# Patient Record
Sex: Female | Born: 1940 | Race: White | Hispanic: No | Marital: Married | Smoking: Former smoker
Health system: Southern US, Community
[De-identification: ages and names within clinical notes are randomized; demographics above are authoritative.]

## PROBLEM LIST (undated history)

## (undated) DIAGNOSIS — K559 Vascular disorder of intestine, unspecified: Secondary | ICD-10-CM

## (undated) DIAGNOSIS — E785 Hyperlipidemia, unspecified: Secondary | ICD-10-CM

## (undated) DIAGNOSIS — Z860101 Personal history of adenomatous and serrated colon polyps: Secondary | ICD-10-CM

## (undated) DIAGNOSIS — R739 Hyperglycemia, unspecified: Secondary | ICD-10-CM

## (undated) DIAGNOSIS — F419 Anxiety disorder, unspecified: Secondary | ICD-10-CM

## (undated) DIAGNOSIS — R7302 Impaired glucose tolerance (oral): Secondary | ICD-10-CM

## (undated) DIAGNOSIS — Z0000 Encounter for general adult medical examination without abnormal findings: Secondary | ICD-10-CM

## (undated) DIAGNOSIS — J449 Chronic obstructive pulmonary disease, unspecified: Secondary | ICD-10-CM

## (undated) DIAGNOSIS — Z86010 Personal history of colonic polyps: Secondary | ICD-10-CM

## (undated) DIAGNOSIS — F32A Depression, unspecified: Secondary | ICD-10-CM

## (undated) DIAGNOSIS — N952 Postmenopausal atrophic vaginitis: Secondary | ICD-10-CM

## (undated) DIAGNOSIS — R351 Nocturia: Secondary | ICD-10-CM

## (undated) DIAGNOSIS — E079 Disorder of thyroid, unspecified: Secondary | ICD-10-CM

## (undated) DIAGNOSIS — F329 Major depressive disorder, single episode, unspecified: Secondary | ICD-10-CM

## (undated) DIAGNOSIS — K219 Gastro-esophageal reflux disease without esophagitis: Secondary | ICD-10-CM

## (undated) DIAGNOSIS — Z8719 Personal history of other diseases of the digestive system: Secondary | ICD-10-CM

## (undated) DIAGNOSIS — I10 Essential (primary) hypertension: Secondary | ICD-10-CM

## (undated) DIAGNOSIS — A0472 Enterocolitis due to Clostridium difficile, not specified as recurrent: Secondary | ICD-10-CM

## (undated) DIAGNOSIS — R634 Abnormal weight loss: Secondary | ICD-10-CM

## (undated) DIAGNOSIS — M2550 Pain in unspecified joint: Secondary | ICD-10-CM

## (undated) HISTORY — DX: Nocturia: R35.1

## (undated) HISTORY — PX: BLADDER REPAIR: SHX76

## (undated) HISTORY — DX: Personal history of colonic polyps: Z86.010

## (undated) HISTORY — DX: Vascular disorder of intestine, unspecified: K55.9

## (undated) HISTORY — DX: Enterocolitis due to Clostridium difficile, not specified as recurrent: A04.72

## (undated) HISTORY — DX: Hyperglycemia, unspecified: R73.9

## (undated) HISTORY — DX: Major depressive disorder, single episode, unspecified: F32.9

## (undated) HISTORY — DX: Depression, unspecified: F32.A

## (undated) HISTORY — DX: Pain in unspecified joint: M25.50

## (undated) HISTORY — DX: Chronic obstructive pulmonary disease, unspecified: J44.9

## (undated) HISTORY — DX: Personal history of adenomatous and serrated colon polyps: Z86.0101

## (undated) HISTORY — DX: Postmenopausal atrophic vaginitis: N95.2

## (undated) HISTORY — DX: Anxiety disorder, unspecified: F41.9

## (undated) HISTORY — DX: Encounter for general adult medical examination without abnormal findings: Z00.00

## (undated) HISTORY — PX: DILATION AND CURETTAGE OF UTERUS: SHX78

## (undated) HISTORY — DX: Hyperlipidemia, unspecified: E78.5

## (undated) HISTORY — DX: Impaired glucose tolerance (oral): R73.02

## (undated) HISTORY — DX: Essential (primary) hypertension: I10

## (undated) HISTORY — DX: Personal history of other diseases of the digestive system: Z87.19

## (undated) HISTORY — DX: Gastro-esophageal reflux disease without esophagitis: K21.9

## (undated) HISTORY — DX: Disorder of thyroid, unspecified: E07.9

## (undated) HISTORY — PX: CHOLECYSTECTOMY: SHX55

## (undated) HISTORY — PX: TUBAL LIGATION: SHX77

## (undated) HISTORY — DX: Abnormal weight loss: R63.4

---

## 1991-10-30 HISTORY — PX: ABDOMINAL HYSTERECTOMY: SHX81

## 1998-01-28 ENCOUNTER — Emergency Department (HOSPITAL_COMMUNITY): Admission: EM | Admit: 1998-01-28 | Discharge: 1998-01-28 | Payer: Self-pay | Admitting: Emergency Medicine

## 2001-12-17 ENCOUNTER — Ambulatory Visit (HOSPITAL_COMMUNITY): Admission: RE | Admit: 2001-12-17 | Discharge: 2001-12-17 | Payer: Self-pay | Admitting: Internal Medicine

## 2001-12-17 ENCOUNTER — Encounter: Payer: Self-pay | Admitting: Internal Medicine

## 2002-02-09 ENCOUNTER — Ambulatory Visit (HOSPITAL_COMMUNITY): Admission: RE | Admit: 2002-02-09 | Discharge: 2002-02-09 | Payer: Self-pay | Admitting: Internal Medicine

## 2002-04-27 ENCOUNTER — Ambulatory Visit (HOSPITAL_COMMUNITY): Admission: RE | Admit: 2002-04-27 | Discharge: 2002-04-27 | Payer: Self-pay | Admitting: Internal Medicine

## 2002-04-27 ENCOUNTER — Encounter: Payer: Self-pay | Admitting: Internal Medicine

## 2003-05-06 ENCOUNTER — Encounter: Payer: Self-pay | Admitting: Internal Medicine

## 2003-05-06 ENCOUNTER — Ambulatory Visit (HOSPITAL_COMMUNITY): Admission: RE | Admit: 2003-05-06 | Discharge: 2003-05-06 | Payer: Self-pay | Admitting: Internal Medicine

## 2003-05-11 ENCOUNTER — Ambulatory Visit (HOSPITAL_COMMUNITY): Admission: RE | Admit: 2003-05-11 | Discharge: 2003-05-11 | Payer: Self-pay | Admitting: Internal Medicine

## 2003-05-11 ENCOUNTER — Encounter: Payer: Self-pay | Admitting: Internal Medicine

## 2003-06-21 ENCOUNTER — Ambulatory Visit (HOSPITAL_COMMUNITY): Admission: RE | Admit: 2003-06-21 | Discharge: 2003-06-21 | Payer: Self-pay | Admitting: Internal Medicine

## 2003-06-21 ENCOUNTER — Encounter: Payer: Self-pay | Admitting: Internal Medicine

## 2003-09-27 ENCOUNTER — Ambulatory Visit (HOSPITAL_COMMUNITY): Admission: RE | Admit: 2003-09-27 | Discharge: 2003-09-27 | Payer: Self-pay | Admitting: Internal Medicine

## 2004-04-19 ENCOUNTER — Ambulatory Visit (HOSPITAL_COMMUNITY): Admission: RE | Admit: 2004-04-19 | Discharge: 2004-04-19 | Payer: Self-pay | Admitting: Internal Medicine

## 2004-09-28 ENCOUNTER — Inpatient Hospital Stay (HOSPITAL_COMMUNITY): Admission: EM | Admit: 2004-09-28 | Discharge: 2004-10-04 | Payer: Self-pay | Admitting: Emergency Medicine

## 2004-10-05 ENCOUNTER — Ambulatory Visit: Payer: Self-pay | Admitting: Internal Medicine

## 2004-11-22 ENCOUNTER — Ambulatory Visit: Payer: Self-pay | Admitting: Internal Medicine

## 2005-01-05 ENCOUNTER — Ambulatory Visit: Payer: Self-pay | Admitting: Internal Medicine

## 2005-02-15 ENCOUNTER — Ambulatory Visit: Payer: Self-pay | Admitting: Internal Medicine

## 2005-07-30 ENCOUNTER — Ambulatory Visit (HOSPITAL_COMMUNITY): Admission: RE | Admit: 2005-07-30 | Discharge: 2005-07-30 | Payer: Self-pay | Admitting: Internal Medicine

## 2006-02-05 ENCOUNTER — Ambulatory Visit (HOSPITAL_COMMUNITY): Admission: RE | Admit: 2006-02-05 | Discharge: 2006-02-05 | Payer: Self-pay | Admitting: Internal Medicine

## 2006-04-30 ENCOUNTER — Ambulatory Visit: Payer: Self-pay | Admitting: Internal Medicine

## 2006-06-13 ENCOUNTER — Ambulatory Visit: Payer: Self-pay | Admitting: Internal Medicine

## 2006-07-11 ENCOUNTER — Ambulatory Visit: Payer: Self-pay | Admitting: Critical Care Medicine

## 2006-09-26 ENCOUNTER — Ambulatory Visit: Payer: Self-pay | Admitting: Internal Medicine

## 2006-10-25 ENCOUNTER — Ambulatory Visit: Payer: Self-pay | Admitting: Critical Care Medicine

## 2006-11-04 ENCOUNTER — Ambulatory Visit: Payer: Self-pay | Admitting: Internal Medicine

## 2006-12-16 ENCOUNTER — Ambulatory Visit (HOSPITAL_COMMUNITY): Admission: RE | Admit: 2006-12-16 | Discharge: 2006-12-16 | Payer: Self-pay | Admitting: Internal Medicine

## 2006-12-16 ENCOUNTER — Encounter (INDEPENDENT_AMBULATORY_CARE_PROVIDER_SITE_OTHER): Payer: Self-pay | Admitting: *Deleted

## 2006-12-23 ENCOUNTER — Ambulatory Visit: Payer: Self-pay | Admitting: Internal Medicine

## 2007-01-13 ENCOUNTER — Ambulatory Visit: Payer: Self-pay | Admitting: Family Medicine

## 2007-01-20 ENCOUNTER — Ambulatory Visit: Payer: Self-pay | Admitting: Internal Medicine

## 2007-01-23 ENCOUNTER — Inpatient Hospital Stay (HOSPITAL_COMMUNITY): Admission: EM | Admit: 2007-01-23 | Discharge: 2007-01-28 | Payer: Self-pay | Admitting: Emergency Medicine

## 2007-01-23 ENCOUNTER — Ambulatory Visit: Payer: Self-pay | Admitting: Pulmonary Disease

## 2007-02-03 ENCOUNTER — Ambulatory Visit: Payer: Self-pay | Admitting: Internal Medicine

## 2007-02-03 ENCOUNTER — Ambulatory Visit: Payer: Self-pay | Admitting: Pulmonary Disease

## 2007-02-17 ENCOUNTER — Ambulatory Visit: Payer: Self-pay | Admitting: Internal Medicine

## 2007-02-24 ENCOUNTER — Ambulatory Visit: Payer: Self-pay | Admitting: Internal Medicine

## 2007-03-11 ENCOUNTER — Ambulatory Visit: Payer: Self-pay | Admitting: Internal Medicine

## 2007-04-28 ENCOUNTER — Ambulatory Visit: Payer: Self-pay | Admitting: Internal Medicine

## 2007-07-22 ENCOUNTER — Ambulatory Visit: Payer: Self-pay | Admitting: Internal Medicine

## 2007-07-22 LAB — CONVERTED CEMR LAB
Basophils Absolute: 0.1 10*3/uL (ref 0.0–0.1)
Eosinophils Absolute: 0.2 10*3/uL (ref 0.0–0.6)
Eosinophils Relative: 3.2 % (ref 0.0–5.0)
GFR calc Af Amer: 129 mL/min
GFR calc non Af Amer: 107 mL/min
Glucose, Bld: 82 mg/dL (ref 70–99)
HCT: 42.1 % (ref 36.0–46.0)
Lymphocytes Relative: 32.5 % (ref 12.0–46.0)
MCHC: 34 g/dL (ref 30.0–36.0)
MCV: 90 fL (ref 78.0–100.0)
Neutro Abs: 3.6 10*3/uL (ref 1.4–7.7)
Neutrophils Relative %: 52.6 % (ref 43.0–77.0)
Platelets: 392 10*3/uL (ref 150–400)
Sodium: 142 meq/L (ref 135–145)
WBC: 7 10*3/uL (ref 4.5–10.5)

## 2007-07-31 ENCOUNTER — Ambulatory Visit: Payer: Self-pay | Admitting: Internal Medicine

## 2007-07-31 ENCOUNTER — Encounter: Payer: Self-pay | Admitting: Internal Medicine

## 2007-07-31 DIAGNOSIS — J449 Chronic obstructive pulmonary disease, unspecified: Secondary | ICD-10-CM

## 2007-07-31 DIAGNOSIS — I10 Essential (primary) hypertension: Secondary | ICD-10-CM

## 2007-07-31 DIAGNOSIS — J4489 Other specified chronic obstructive pulmonary disease: Secondary | ICD-10-CM | POA: Insufficient documentation

## 2007-07-31 DIAGNOSIS — K219 Gastro-esophageal reflux disease without esophagitis: Secondary | ICD-10-CM

## 2007-08-04 ENCOUNTER — Encounter: Payer: Self-pay | Admitting: Internal Medicine

## 2007-08-15 ENCOUNTER — Ambulatory Visit: Payer: Self-pay | Admitting: Internal Medicine

## 2007-10-15 ENCOUNTER — Telehealth (INDEPENDENT_AMBULATORY_CARE_PROVIDER_SITE_OTHER): Payer: Self-pay | Admitting: *Deleted

## 2007-10-17 DIAGNOSIS — F418 Other specified anxiety disorders: Secondary | ICD-10-CM

## 2007-10-17 DIAGNOSIS — G43909 Migraine, unspecified, not intractable, without status migrainosus: Secondary | ICD-10-CM | POA: Insufficient documentation

## 2007-10-17 DIAGNOSIS — F411 Generalized anxiety disorder: Secondary | ICD-10-CM | POA: Insufficient documentation

## 2007-10-27 ENCOUNTER — Ambulatory Visit: Payer: Self-pay | Admitting: Internal Medicine

## 2007-10-27 ENCOUNTER — Encounter (INDEPENDENT_AMBULATORY_CARE_PROVIDER_SITE_OTHER): Payer: Self-pay | Admitting: *Deleted

## 2007-10-27 DIAGNOSIS — E039 Hypothyroidism, unspecified: Secondary | ICD-10-CM | POA: Insufficient documentation

## 2007-10-27 DIAGNOSIS — R498 Other voice and resonance disorders: Secondary | ICD-10-CM

## 2007-12-09 ENCOUNTER — Telehealth: Payer: Self-pay | Admitting: Internal Medicine

## 2007-12-20 ENCOUNTER — Emergency Department (HOSPITAL_COMMUNITY): Admission: EM | Admit: 2007-12-20 | Discharge: 2007-12-20 | Payer: Self-pay | Admitting: Emergency Medicine

## 2007-12-22 ENCOUNTER — Ambulatory Visit: Payer: Self-pay | Admitting: Internal Medicine

## 2007-12-22 DIAGNOSIS — E739 Lactose intolerance, unspecified: Secondary | ICD-10-CM | POA: Insufficient documentation

## 2007-12-22 DIAGNOSIS — D72829 Elevated white blood cell count, unspecified: Secondary | ICD-10-CM | POA: Insufficient documentation

## 2007-12-22 DIAGNOSIS — R1032 Left lower quadrant pain: Secondary | ICD-10-CM

## 2007-12-22 LAB — CONVERTED CEMR LAB
BUN: 10 mg/dL (ref 6–23)
Basophils Absolute: 0 K/uL (ref 0.0–0.1)
Basophils Relative: 0 % (ref 0.0–1.0)
Bilirubin Urine: NEGATIVE
CO2: 30 meq/L (ref 19–32)
Calcium: 8.7 mg/dL (ref 8.4–10.5)
Chloride: 93 meq/L — ABNORMAL LOW (ref 96–112)
Creatinine, Ser: 0.8 mg/dL (ref 0.4–1.2)
Eosinophils Absolute: 0.1 K/uL (ref 0.0–0.6)
Eosinophils Relative: 0.4 % (ref 0.0–5.0)
GFR calc Af Amer: 93 mL/min
GFR calc non Af Amer: 77 mL/min
Glucose, Bld: 119 mg/dL — ABNORMAL HIGH (ref 70–99)
HCT: 41.5 % (ref 36.0–46.0)
Hemoglobin: 13.8 g/dL (ref 12.0–15.0)
Leukocytes, UA: NEGATIVE
Lymphocytes Relative: 3.1 % — ABNORMAL LOW (ref 12.0–46.0)
MCHC: 33.3 g/dL (ref 30.0–36.0)
MCV: 89.4 fL (ref 78.0–100.0)
Monocytes Absolute: 1 K/uL — ABNORMAL HIGH (ref 0.2–0.7)
Monocytes Relative: 4.2 % (ref 3.0–11.0)
Neutro Abs: 21.8 K/uL — ABNORMAL HIGH (ref 1.4–7.7)
Neutrophils Relative %: 92.3 % — ABNORMAL HIGH (ref 43.0–77.0)
Nitrite: NEGATIVE
Platelets: 336 K/uL (ref 150–400)
Potassium: 3.9 meq/L (ref 3.5–5.1)
RBC: 4.65 M/uL (ref 3.87–5.11)
RDW: 12.8 % (ref 11.5–14.6)
Sodium: 134 meq/L — ABNORMAL LOW (ref 135–145)
Specific Gravity, Urine: <=1.005 (ref 1.000–1.03)
Total Protein, Urine: NEGATIVE mg/dL
Urine Glucose: NEGATIVE mg/dL
Urobilinogen, UA: 0.2 (ref 0.0–1.0)
WBC: 23.6 10*3/microliter (ref 4.5–10.5)
pH: 6 (ref 5.0–8.0)

## 2007-12-23 ENCOUNTER — Ambulatory Visit: Payer: Self-pay | Admitting: Cardiology

## 2007-12-24 ENCOUNTER — Encounter: Payer: Self-pay | Admitting: Internal Medicine

## 2007-12-25 ENCOUNTER — Encounter: Payer: Self-pay | Admitting: Internal Medicine

## 2007-12-26 ENCOUNTER — Ambulatory Visit: Payer: Self-pay | Admitting: Internal Medicine

## 2007-12-26 ENCOUNTER — Inpatient Hospital Stay (HOSPITAL_COMMUNITY): Admission: AD | Admit: 2007-12-26 | Discharge: 2007-12-31 | Payer: Self-pay | Admitting: Internal Medicine

## 2007-12-26 DIAGNOSIS — R5383 Other fatigue: Secondary | ICD-10-CM

## 2007-12-26 DIAGNOSIS — R5381 Other malaise: Secondary | ICD-10-CM

## 2007-12-30 ENCOUNTER — Encounter: Payer: Self-pay | Admitting: Internal Medicine

## 2007-12-30 ENCOUNTER — Ambulatory Visit: Payer: Self-pay | Admitting: Internal Medicine

## 2007-12-31 ENCOUNTER — Encounter: Payer: Self-pay | Admitting: Internal Medicine

## 2008-01-09 ENCOUNTER — Ambulatory Visit: Payer: Self-pay | Admitting: Internal Medicine

## 2008-01-09 DIAGNOSIS — R609 Edema, unspecified: Secondary | ICD-10-CM | POA: Insufficient documentation

## 2008-01-09 DIAGNOSIS — K5289 Other specified noninfective gastroenteritis and colitis: Secondary | ICD-10-CM | POA: Insufficient documentation

## 2008-01-22 ENCOUNTER — Encounter (INDEPENDENT_AMBULATORY_CARE_PROVIDER_SITE_OTHER): Payer: Self-pay | Admitting: *Deleted

## 2008-01-23 ENCOUNTER — Telehealth: Payer: Self-pay | Admitting: Internal Medicine

## 2008-01-23 ENCOUNTER — Encounter (INDEPENDENT_AMBULATORY_CARE_PROVIDER_SITE_OTHER): Payer: Self-pay | Admitting: *Deleted

## 2008-01-23 ENCOUNTER — Inpatient Hospital Stay (HOSPITAL_COMMUNITY): Admission: EM | Admit: 2008-01-23 | Discharge: 2008-01-28 | Payer: Self-pay | Admitting: Emergency Medicine

## 2008-01-24 ENCOUNTER — Encounter (INDEPENDENT_AMBULATORY_CARE_PROVIDER_SITE_OTHER): Payer: Self-pay | Admitting: *Deleted

## 2008-01-26 ENCOUNTER — Encounter (INDEPENDENT_AMBULATORY_CARE_PROVIDER_SITE_OTHER): Payer: Self-pay | Admitting: *Deleted

## 2008-01-28 ENCOUNTER — Encounter (INDEPENDENT_AMBULATORY_CARE_PROVIDER_SITE_OTHER): Payer: Self-pay | Admitting: *Deleted

## 2008-02-02 ENCOUNTER — Ambulatory Visit: Payer: Self-pay | Admitting: Internal Medicine

## 2008-02-03 ENCOUNTER — Ambulatory Visit: Payer: Self-pay | Admitting: Internal Medicine

## 2008-02-03 ENCOUNTER — Encounter: Payer: Self-pay | Admitting: Internal Medicine

## 2008-02-04 ENCOUNTER — Ambulatory Visit: Payer: Self-pay | Admitting: Internal Medicine

## 2008-02-04 DIAGNOSIS — K56609 Unspecified intestinal obstruction, unspecified as to partial versus complete obstruction: Secondary | ICD-10-CM | POA: Insufficient documentation

## 2008-02-06 ENCOUNTER — Ambulatory Visit (HOSPITAL_COMMUNITY): Admission: RE | Admit: 2008-02-06 | Discharge: 2008-02-06 | Payer: Self-pay | Admitting: Internal Medicine

## 2008-02-19 ENCOUNTER — Telehealth (INDEPENDENT_AMBULATORY_CARE_PROVIDER_SITE_OTHER): Payer: Self-pay | Admitting: *Deleted

## 2008-02-23 ENCOUNTER — Encounter: Payer: Self-pay | Admitting: Internal Medicine

## 2008-02-24 ENCOUNTER — Telehealth: Payer: Self-pay | Admitting: Internal Medicine

## 2008-02-25 ENCOUNTER — Ambulatory Visit: Payer: Self-pay | Admitting: Internal Medicine

## 2008-02-26 ENCOUNTER — Telehealth: Payer: Self-pay | Admitting: Internal Medicine

## 2008-03-01 ENCOUNTER — Encounter: Payer: Self-pay | Admitting: Internal Medicine

## 2008-03-16 ENCOUNTER — Ambulatory Visit: Payer: Self-pay | Admitting: Internal Medicine

## 2008-03-16 DIAGNOSIS — R197 Diarrhea, unspecified: Secondary | ICD-10-CM

## 2008-03-16 DIAGNOSIS — R030 Elevated blood-pressure reading, without diagnosis of hypertension: Secondary | ICD-10-CM

## 2008-04-08 ENCOUNTER — Ambulatory Visit: Payer: Self-pay | Admitting: Internal Medicine

## 2008-04-08 DIAGNOSIS — R300 Dysuria: Secondary | ICD-10-CM

## 2008-04-08 DIAGNOSIS — R634 Abnormal weight loss: Secondary | ICD-10-CM | POA: Insufficient documentation

## 2008-04-08 LAB — CONVERTED CEMR LAB
Ketones, urine, test strip: NEGATIVE
Mucus, UA: NEGATIVE
Nitrite: NEGATIVE
Specific Gravity, Urine: <=1.005 (ref 1.000–1.03)
Total Protein, Urine: 100 mg/dL — AB
Urine Glucose: NEGATIVE mg/dL
pH: 8.5

## 2008-04-30 ENCOUNTER — Ambulatory Visit: Payer: Self-pay | Admitting: Family Medicine

## 2008-04-30 DIAGNOSIS — J209 Acute bronchitis, unspecified: Secondary | ICD-10-CM

## 2008-05-18 ENCOUNTER — Telehealth: Payer: Self-pay | Admitting: Internal Medicine

## 2008-05-18 ENCOUNTER — Ambulatory Visit (HOSPITAL_BASED_OUTPATIENT_CLINIC_OR_DEPARTMENT_OTHER): Admission: RE | Admit: 2008-05-18 | Discharge: 2008-05-18 | Payer: Self-pay | Admitting: Internal Medicine

## 2008-05-18 ENCOUNTER — Ambulatory Visit: Payer: Self-pay | Admitting: Internal Medicine

## 2008-05-24 ENCOUNTER — Ambulatory Visit: Payer: Self-pay | Admitting: Internal Medicine

## 2008-05-24 DIAGNOSIS — J441 Chronic obstructive pulmonary disease with (acute) exacerbation: Secondary | ICD-10-CM

## 2008-06-02 ENCOUNTER — Ambulatory Visit: Payer: Self-pay | Admitting: Internal Medicine

## 2008-06-02 ENCOUNTER — Inpatient Hospital Stay (HOSPITAL_COMMUNITY): Admission: AD | Admit: 2008-06-02 | Discharge: 2008-06-05 | Payer: Self-pay | Admitting: Internal Medicine

## 2008-06-04 ENCOUNTER — Ambulatory Visit: Payer: Self-pay | Admitting: Internal Medicine

## 2008-06-09 ENCOUNTER — Encounter: Payer: Self-pay | Admitting: Internal Medicine

## 2008-06-23 ENCOUNTER — Encounter: Payer: Self-pay | Admitting: Internal Medicine

## 2008-06-25 ENCOUNTER — Ambulatory Visit: Payer: Self-pay | Admitting: Internal Medicine

## 2008-06-25 DIAGNOSIS — M810 Age-related osteoporosis without current pathological fracture: Secondary | ICD-10-CM

## 2008-06-30 ENCOUNTER — Ambulatory Visit: Payer: Self-pay | Admitting: Family Medicine

## 2008-06-30 ENCOUNTER — Encounter: Payer: Self-pay | Admitting: Internal Medicine

## 2008-07-02 ENCOUNTER — Ambulatory Visit: Payer: Self-pay | Admitting: *Deleted

## 2008-07-02 ENCOUNTER — Inpatient Hospital Stay (HOSPITAL_COMMUNITY): Admission: EM | Admit: 2008-07-02 | Discharge: 2008-07-04 | Payer: Self-pay | Admitting: Internal Medicine

## 2008-07-02 ENCOUNTER — Encounter: Payer: Self-pay | Admitting: Emergency Medicine

## 2008-07-02 DIAGNOSIS — R079 Chest pain, unspecified: Secondary | ICD-10-CM

## 2008-07-02 DIAGNOSIS — R231 Pallor: Secondary | ICD-10-CM

## 2008-07-02 DIAGNOSIS — R0602 Shortness of breath: Secondary | ICD-10-CM | POA: Insufficient documentation

## 2008-07-16 ENCOUNTER — Encounter: Payer: Self-pay | Admitting: Internal Medicine

## 2008-07-16 ENCOUNTER — Ambulatory Visit: Payer: Self-pay

## 2008-07-26 ENCOUNTER — Ambulatory Visit: Payer: Self-pay | Admitting: Internal Medicine

## 2008-07-26 LAB — CONVERTED CEMR LAB
CO2: 33 meq/L — ABNORMAL HIGH (ref 19–32)
Chloride: 99 meq/L (ref 96–112)
Magnesium: 2.4 mg/dL (ref 1.5–2.5)
Phosphorus: 4.4 mg/dL (ref 2.3–4.6)
Sodium: 140 meq/L (ref 135–145)
Vit D, 1,25-Dihydroxy: 25 — ABNORMAL LOW (ref 30–89)

## 2008-07-27 ENCOUNTER — Ambulatory Visit: Payer: Self-pay | Admitting: Cardiology

## 2008-07-28 ENCOUNTER — Telehealth: Payer: Self-pay | Admitting: Internal Medicine

## 2008-07-29 ENCOUNTER — Encounter: Payer: Self-pay | Admitting: Internal Medicine

## 2008-08-03 ENCOUNTER — Ambulatory Visit (HOSPITAL_COMMUNITY): Admission: RE | Admit: 2008-08-03 | Discharge: 2008-08-03 | Payer: Self-pay | Admitting: Internal Medicine

## 2008-08-11 ENCOUNTER — Ambulatory Visit: Payer: Self-pay | Admitting: Internal Medicine

## 2008-08-20 ENCOUNTER — Telehealth: Payer: Self-pay | Admitting: Family Medicine

## 2008-09-21 ENCOUNTER — Ambulatory Visit: Payer: Self-pay | Admitting: Internal Medicine

## 2008-09-21 DIAGNOSIS — M2550 Pain in unspecified joint: Secondary | ICD-10-CM | POA: Insufficient documentation

## 2008-09-29 ENCOUNTER — Ambulatory Visit: Payer: Self-pay | Admitting: Diagnostic Radiology

## 2008-09-29 ENCOUNTER — Ambulatory Visit: Payer: Self-pay | Admitting: *Deleted

## 2008-09-29 ENCOUNTER — Emergency Department (HOSPITAL_BASED_OUTPATIENT_CLINIC_OR_DEPARTMENT_OTHER): Admission: EM | Admit: 2008-09-29 | Discharge: 2008-09-29 | Payer: Self-pay | Admitting: Emergency Medicine

## 2008-09-30 ENCOUNTER — Telehealth (INDEPENDENT_AMBULATORY_CARE_PROVIDER_SITE_OTHER): Payer: Self-pay | Admitting: *Deleted

## 2008-10-12 ENCOUNTER — Ambulatory Visit: Payer: Self-pay | Admitting: Internal Medicine

## 2008-11-02 ENCOUNTER — Ambulatory Visit: Payer: Self-pay | Admitting: Internal Medicine

## 2008-11-17 ENCOUNTER — Ambulatory Visit: Payer: Self-pay | Admitting: Internal Medicine

## 2008-11-17 ENCOUNTER — Telehealth: Payer: Self-pay | Admitting: Internal Medicine

## 2008-11-18 ENCOUNTER — Encounter: Payer: Self-pay | Admitting: Internal Medicine

## 2009-01-17 ENCOUNTER — Ambulatory Visit: Payer: Self-pay | Admitting: Internal Medicine

## 2009-01-20 ENCOUNTER — Ambulatory Visit: Payer: Self-pay | Admitting: Internal Medicine

## 2009-01-24 ENCOUNTER — Telehealth (INDEPENDENT_AMBULATORY_CARE_PROVIDER_SITE_OTHER): Payer: Self-pay | Admitting: *Deleted

## 2009-02-01 ENCOUNTER — Ambulatory Visit: Payer: Self-pay | Admitting: Internal Medicine

## 2009-02-07 ENCOUNTER — Telehealth (INDEPENDENT_AMBULATORY_CARE_PROVIDER_SITE_OTHER): Payer: Self-pay | Admitting: *Deleted

## 2009-02-15 ENCOUNTER — Telehealth: Payer: Self-pay | Admitting: Internal Medicine

## 2009-02-24 DIAGNOSIS — Z860100 Personal history of colon polyps, unspecified: Secondary | ICD-10-CM | POA: Insufficient documentation

## 2009-02-24 DIAGNOSIS — Z8719 Personal history of other diseases of the digestive system: Secondary | ICD-10-CM

## 2009-02-28 ENCOUNTER — Ambulatory Visit: Payer: Self-pay | Admitting: Internal Medicine

## 2009-02-28 DIAGNOSIS — R159 Full incontinence of feces: Secondary | ICD-10-CM

## 2009-03-08 ENCOUNTER — Ambulatory Visit: Payer: Self-pay | Admitting: Internal Medicine

## 2009-03-15 ENCOUNTER — Encounter: Payer: Self-pay | Admitting: Internal Medicine

## 2009-04-07 ENCOUNTER — Encounter: Payer: Self-pay | Admitting: Internal Medicine

## 2009-04-18 ENCOUNTER — Ambulatory Visit: Payer: Self-pay | Admitting: Internal Medicine

## 2009-04-21 ENCOUNTER — Ambulatory Visit: Payer: Self-pay | Admitting: Internal Medicine

## 2009-05-10 ENCOUNTER — Telehealth (INDEPENDENT_AMBULATORY_CARE_PROVIDER_SITE_OTHER): Payer: Self-pay | Admitting: *Deleted

## 2009-05-16 ENCOUNTER — Telehealth: Payer: Self-pay | Admitting: Internal Medicine

## 2009-05-30 ENCOUNTER — Telehealth: Payer: Self-pay | Admitting: Internal Medicine

## 2009-05-30 ENCOUNTER — Encounter: Payer: Self-pay | Admitting: Internal Medicine

## 2009-06-10 ENCOUNTER — Encounter: Payer: Self-pay | Admitting: Internal Medicine

## 2009-06-21 ENCOUNTER — Encounter: Payer: Self-pay | Admitting: Internal Medicine

## 2009-06-28 ENCOUNTER — Encounter: Payer: Self-pay | Admitting: Internal Medicine

## 2009-07-07 ENCOUNTER — Encounter: Payer: Self-pay | Admitting: Internal Medicine

## 2009-07-08 ENCOUNTER — Ambulatory Visit: Payer: Self-pay | Admitting: Internal Medicine

## 2009-08-12 ENCOUNTER — Ambulatory Visit (HOSPITAL_BASED_OUTPATIENT_CLINIC_OR_DEPARTMENT_OTHER): Admission: RE | Admit: 2009-08-12 | Discharge: 2009-08-12 | Payer: Self-pay | Admitting: Internal Medicine

## 2009-08-12 ENCOUNTER — Ambulatory Visit: Payer: Self-pay | Admitting: Radiology

## 2009-08-18 ENCOUNTER — Ambulatory Visit: Payer: Self-pay | Admitting: Internal Medicine

## 2009-08-22 ENCOUNTER — Ambulatory Visit: Payer: Self-pay | Admitting: Internal Medicine

## 2009-08-22 ENCOUNTER — Ambulatory Visit: Payer: Self-pay | Admitting: Diagnostic Radiology

## 2009-08-22 ENCOUNTER — Ambulatory Visit (HOSPITAL_BASED_OUTPATIENT_CLINIC_OR_DEPARTMENT_OTHER): Admission: RE | Admit: 2009-08-22 | Discharge: 2009-08-22 | Payer: Self-pay | Admitting: Internal Medicine

## 2009-12-29 ENCOUNTER — Ambulatory Visit: Payer: Self-pay | Admitting: Internal Medicine

## 2009-12-29 ENCOUNTER — Telehealth: Payer: Self-pay | Admitting: Internal Medicine

## 2009-12-29 LAB — CONVERTED CEMR LAB
Basophils Absolute: 0.1 10*3/uL (ref 0.0–0.1)
Calcium: 8.9 mg/dL (ref 8.4–10.5)
Creatinine, Ser: 0.6 mg/dL (ref 0.4–1.2)
GFR calc non Af Amer: 105.83 mL/min (ref >60)
Glucose, Bld: 84 mg/dL (ref 70–99)
Hemoglobin: 13.1 g/dL (ref 12.0–15.0)
Lymphocytes Relative: 33.8 % (ref 12.0–46.0)
Monocytes Relative: 9.2 % (ref 3.0–12.0)
Neutro Abs: 3.1 10*3/uL (ref 1.4–7.7)
Neutrophils Relative %: 54.3 % (ref 43.0–77.0)
RDW: 13.4 % (ref 11.5–14.6)
Sodium: 145 meq/L (ref 135–145)

## 2010-01-12 ENCOUNTER — Ambulatory Visit: Payer: Self-pay | Admitting: Internal Medicine

## 2010-01-12 ENCOUNTER — Encounter (INDEPENDENT_AMBULATORY_CARE_PROVIDER_SITE_OTHER): Payer: Self-pay | Admitting: *Deleted

## 2010-01-19 ENCOUNTER — Ambulatory Visit (HOSPITAL_COMMUNITY): Admission: RE | Admit: 2010-01-19 | Discharge: 2010-01-19 | Payer: Self-pay | Admitting: Internal Medicine

## 2010-01-24 ENCOUNTER — Telehealth: Payer: Self-pay | Admitting: Internal Medicine

## 2010-02-01 ENCOUNTER — Encounter (INDEPENDENT_AMBULATORY_CARE_PROVIDER_SITE_OTHER): Payer: Self-pay | Admitting: *Deleted

## 2010-02-07 ENCOUNTER — Encounter: Payer: Self-pay | Admitting: Internal Medicine

## 2010-02-20 ENCOUNTER — Encounter (INDEPENDENT_AMBULATORY_CARE_PROVIDER_SITE_OTHER): Payer: Self-pay | Admitting: *Deleted

## 2010-02-22 ENCOUNTER — Ambulatory Visit: Payer: Self-pay | Admitting: Internal Medicine

## 2010-03-01 ENCOUNTER — Ambulatory Visit: Payer: Self-pay | Admitting: Internal Medicine

## 2010-03-03 ENCOUNTER — Encounter: Payer: Self-pay | Admitting: Internal Medicine

## 2010-03-10 ENCOUNTER — Ambulatory Visit: Payer: Self-pay | Admitting: Internal Medicine

## 2010-03-13 ENCOUNTER — Telehealth: Payer: Self-pay | Admitting: Internal Medicine

## 2010-03-13 ENCOUNTER — Ambulatory Visit: Payer: Self-pay | Admitting: Internal Medicine

## 2010-03-31 ENCOUNTER — Telehealth: Payer: Self-pay | Admitting: Internal Medicine

## 2010-05-11 ENCOUNTER — Telehealth: Payer: Self-pay | Admitting: Internal Medicine

## 2010-05-11 ENCOUNTER — Encounter: Payer: Self-pay | Admitting: Internal Medicine

## 2010-05-12 ENCOUNTER — Encounter: Payer: Self-pay | Admitting: Internal Medicine

## 2010-05-23 ENCOUNTER — Encounter: Payer: Self-pay | Admitting: Internal Medicine

## 2010-05-29 ENCOUNTER — Ambulatory Visit: Payer: Self-pay | Admitting: Internal Medicine

## 2010-05-30 ENCOUNTER — Ambulatory Visit: Payer: Self-pay | Admitting: Pulmonary Disease

## 2010-05-30 DIAGNOSIS — B370 Candidal stomatitis: Secondary | ICD-10-CM | POA: Insufficient documentation

## 2010-07-26 ENCOUNTER — Encounter (HOSPITAL_COMMUNITY): Admission: RE | Admit: 2010-07-26 | Discharge: 2010-07-28 | Payer: Self-pay | Admitting: Pulmonary Disease

## 2010-07-29 ENCOUNTER — Encounter (HOSPITAL_COMMUNITY)
Admission: RE | Admit: 2010-07-29 | Discharge: 2010-10-27 | Payer: Self-pay | Source: Home / Self Care | Attending: Pulmonary Disease | Admitting: Pulmonary Disease

## 2010-08-02 ENCOUNTER — Ambulatory Visit: Payer: Self-pay | Admitting: Internal Medicine

## 2010-08-30 ENCOUNTER — Ambulatory Visit: Payer: Self-pay | Admitting: Pulmonary Disease

## 2010-09-01 ENCOUNTER — Telehealth: Payer: Self-pay | Admitting: Internal Medicine

## 2010-09-01 ENCOUNTER — Ambulatory Visit: Payer: Self-pay | Admitting: Internal Medicine

## 2010-09-28 ENCOUNTER — Ambulatory Visit: Payer: Self-pay | Admitting: Pulmonary Disease

## 2010-10-03 ENCOUNTER — Telehealth (INDEPENDENT_AMBULATORY_CARE_PROVIDER_SITE_OTHER): Payer: Self-pay | Admitting: *Deleted

## 2010-10-04 ENCOUNTER — Ambulatory Visit: Payer: Self-pay | Admitting: Pulmonary Disease

## 2010-10-11 ENCOUNTER — Ambulatory Visit: Payer: Self-pay | Admitting: Adult Health

## 2010-10-29 ENCOUNTER — Encounter (HOSPITAL_COMMUNITY)
Admission: RE | Admit: 2010-10-29 | Discharge: 2010-11-28 | Payer: Self-pay | Source: Home / Self Care | Attending: Pulmonary Disease | Admitting: Pulmonary Disease

## 2010-11-14 ENCOUNTER — Encounter: Payer: Self-pay | Admitting: Internal Medicine

## 2010-11-14 ENCOUNTER — Encounter: Payer: Self-pay | Admitting: Pulmonary Disease

## 2010-11-17 ENCOUNTER — Telehealth: Payer: Self-pay | Admitting: Internal Medicine

## 2010-11-19 ENCOUNTER — Encounter: Payer: Self-pay | Admitting: Internal Medicine

## 2010-11-27 ENCOUNTER — Ambulatory Visit (INDEPENDENT_AMBULATORY_CARE_PROVIDER_SITE_OTHER)
Admission: RE | Admit: 2010-11-27 | Discharge: 2010-11-27 | Payer: Medicare FFS | Source: Home / Self Care | Attending: Internal Medicine | Admitting: Internal Medicine

## 2010-11-27 DIAGNOSIS — R634 Abnormal weight loss: Secondary | ICD-10-CM

## 2010-11-30 ENCOUNTER — Encounter (HOSPITAL_COMMUNITY): Payer: Medicare FFS

## 2010-11-30 NOTE — Progress Notes (Signed)
Summary: Allergy   Phone Note Call from Patient Call back at Home Phone [phone]   Caller: Patient Call For: D. Thomos Lemons DO Summary of Call: patient called and left voice message stating she has been having sneezing , and nasal congestion, and believes she is aggravated by her allergies. She states she will be going to a funeral in [place] at 2p and would like to know if there is something over the counter that she could safely buy and take with all her other medications Initial call taken by: Glendell Docker CMA,  March 31, 2010 10:02 AM  Follow-up for Phone Call        she can take allegra over the counter once daily Follow-up by: D. Thomos Lemons DO,  March 31, 2010 10:56 AM  Additional Follow-up for Phone Call Additional follow up Details #1::        patient advised per Dr Artist Pais instructions Additional Follow-up by: Glendell Docker CMA,  March 31, 2010 11:23 AM

## 2010-11-30 NOTE — Progress Notes (Signed)
  Phone Note Outgoing Call   Summary of Call: called pt but got answering machine.  Dondra Spry DO  December 29, 2009 5:48 PM  Please call pt in AM -  CXR negative for acute changes.   Labs normal.  However, I still she is having COPD exacerbation.  Take avelox and prednisone as directed. Dondra Spry DO  December 29, 2009 5:49 PM Initial call taken by: D. Thomos Lemons DO,  December 29, 2009 5:49 PM  Follow-up for Phone Call        informed pt . cxr showed no acute changes, labs are normal. Instructed her to take meds as directed by Dr.Yoo because Dr.Yoo still thinks it could be COPD Exacerbation. PT. states to assure Dr.Yoo she will take meds as instructed.Michaelle Copas  December 30, 2009 9:07 AM

## 2010-11-30 NOTE — Assessment & Plan Note (Signed)
Summary: f/u - pt req [address]//kp   Visit Type:  Follow-up Primary Provider/Referring Provider:  Dondra Spry DO  CC:  3 month follow up. Pt started Pulm Rehab first of October, intermittent non-productive cough, and can feel a difference in breathing. Denies wheezing.  History of Present Illness: 70 yowf  last smoked 2002 with GOLD stg IV COPD FEV1 26% 6/10 on 24 h O2 since 2009.  October 12, 2008 ov with worsening doe x 2 months to point where can only go 50 feet  with neg cardiac w/u  and subjective wheezing better with nebulizer.  no cough but quite hoarse.   breathing  no better on advair, hoarseness worse--Advair stopped-changed to Symbicort   November 02, 2008-f/u ov.  tried change to Brovana and Budesonide, the equivalent of Symbicort, per neb but liked symbicort better.  May 30, 2010 9:39 AM  -  Dyspnea worse, pred taper started by Dr Artist Pais. Wonders what else can be done to improve her breathing. Using MDI 2-3 /d, not needing nebs CXR 5/11 hyperinflation  August 30, 2010 1:47 PM  Started pulm rehab october 1 st, doing well - can walk 11 laps now,  intermittent non-productive cough, and can feel a difference in breathing. Denies wheezing. Has been able to stay off O2 at rest, albuterol MDI lasts 2 months   Pt denies any significant sore throat, dyphagia, itching, sneezing,  nasal congestion or excess secretions or cough,  fever, chills, sweats, unintended wt loss, pleuritic or exertional cp, change in activity tolerance  orthopnea pnd or leg swelling.     Preventive Screening-Counseling & Management  Alcohol-Tobacco     Alcohol drinks/day: <1     Alcohol type: wine     Smoking Status: quit     Packs/Day: 2.0     Year Started: 1962     Year Quit: 2001  Current Medications (verified): 1)  Symbicort 160-4.5 Mcg/act  Aero (Budesonide-Formoterol Fumarate) .... Two Puffs First Thing in Am and Again in Pm 2)  Spiriva Handihaler 18 Mcg  Caps (Tiotropium Bromide  Monohydrate) .... Inhale Contents of 1 Capsule Once A Day 3)  Alprazolam 0.5 Mg  Tabs (Alprazolam) .... Take 1 Tablet By Mouth Every Morning and Take 2 Tab By Mouth At Bedtime 4)  Zegerid 40-1100 Mg  Caps (Omeprazole-Sodium Bicarbonate) .... Two Times A Day 5)  Calcium Carbonate-Vitamin D 600-400 Mg-Unit  Tabs (Calcium Carbonate-Vitamin D) .... Take 1 Tablet By Mouth Two Times A Day 6)  Vitamin D 1000 Unit Tabs (Cholecalciferol) .... Take 1 Tablet By Mouth Once A Day 7)  Oxygen .... Use  3 Liters Daily 8)  Proventil Hfa 108 (90 Base) Mcg/act  Aers (Albuterol Sulfate) .Marland Kitchen.. 1-2 Puffs Every 4-6 Hours As Needed For Wheezing 9)  Albuterol Sulfate (2.5 Mg/57ml) 0.083%  Nebu (Albuterol Sulfate) .... Inhale 1 Vial Via Nebulizer Every 4-6 Hours As Needed 10)  Mucinex Dm 30-600 Mg  Tb12 (Dextromethorphan-Guaifenesin) .... Take 1 Tablet By Mouth Two Times A Day 11)  Coenzyme Q10 100 Mg Caps (Coenzyme Q10) .Marland Kitchen.. 1 Once Daily 12)  Mirtazapine 15 Mg Tabs (Mirtazapine) .... One By Mouth At Bedtime  Allergies: 1)  ! Morphine  Past History:  Past Medical History: Last updated: 08/02/2010 COPD...................................................................................................Marland KitchenWert      -PFTs 09/26/2006 with FEV1 33%, ratio 30% and no reversibility.  Diffusing capacity 47%     -PFT's  04/21/2009  26%  ratio 34  no reversiblity and dlco 30%     -HFA 75% effective April 21, 2009   Hypothyroidism - subclinical  glucose intolerance    Colitis     H1N1-- November 02, 2008   COLONIC POLYPS, ADENOMATOUS, HX OF (ICD-V12.72) SMALL BOWEL OBSTRUCTION, HX OF (ICD-V12.79) NEED PROPHYLACTIC VACCINATION&INOCULATION FLU (ICD-V04.81) ARTHRALGIA (ICD-719.40) DYSPNEA (ICD-786.05) PALLOR (ICD-782.61) CHEST PAIN (ICD-786.50) OSTEOPOROSIS (ICD-733.00) CHRONIC OBSTRUCTIVE PULMONARY DISEASE, ACUTE EXACERBATION (ICD-491.21) ACUTE BRONCHITIS (ICD-466.0) WEIGHT LOSS (ICD-783.21) DYSURIA  (ICD-788.1) ELEVATED BLOOD PRESSURE WITHOUT DIAGNOSIS OF HYPERTENSION (ICD-796.2) DIARRHEA (ICD-787.91) SMALL BOWEL OBSTRUCTION (ICD-560.9) COLITIS (ICD-558.9) EDEMA (ICD-782.3) WEAKNESS (ICD-780.79) GLUCOSE INTOLERANCE (ICD-271.3) LEUKOCYTOSIS (ICD-288.60) ABDOMINAL PAIN, LEFT LOWER QUADRANT (ICD-789.04) HOARSENESS (ICD-784.49) HYPOTHYROIDISM (ICD-244.9) ANXIETY (ICD-300.00) DEPRESSION (ICD-311) MIGRAINE HEADACHE (ICD-346.90) HYPERTENSION, BENIGN ESSENTIAL (ICD-401.1)  GERD (ICD-530.81) COPD (ICD-496)      Social History: Last updated: 08/02/2010 Former Smoker - q 2003, prev 2 ppd smoker  Married - lives with husband  Occupation: retired [employer] logistics/support/administrative officer    Occasional ETOH                   Review of Systems       The patient complains of dyspnea on exertion.  The patient denies anorexia, fever, weight loss, weight gain, vision loss, decreased hearing, hoarseness, chest pain, syncope, peripheral edema, prolonged cough, headaches, hemoptysis, abdominal pain, melena, hematochezia, severe indigestion/heartburn, hematuria, muscle weakness, suspicious skin lesions, transient blindness, difficulty walking, depression, unusual weight change, abnormal bleeding, enlarged lymph nodes, and angioedema.    Vital Signs:  Patient profile:   70 year old female Height:      60 inches Weight:      107 pounds BMI:     20.97 O2 Sat:      98 % on 3 L/min Temp:     97.4 degrees F oral Pulse rate:   100 / minute BP sitting:   108 / 62  (left arm) Cuff size:   regular  Vitals Entered By: Zackery Barefoot CMA (August 30, 2010 1:37 PM)  O2 Flow:  3 L/min CC: 3 month follow up. Pt started Pulm Rehab first of October, intermittent non-productive cough, can feel a difference in breathing. Denies wheezing Comments Medications reviewed with patient Verified contact number and pharmacy with patient Zackery Barefoot CMA  August 30, 2010 1:39 PM    Physical Exam  Additional Exam:   wt 107 August 30, 2010  Gen. Pleasant, petite, in no distress, normal affect, on 3L O2 ENT - no lesions, no post nasal drip Neck: No JVD, no thyromegaly, no carotid bruits Lungs: no use of accessory muscles, no dullness to percussion, decreased  without rales or rhonchi  Cardiovascular: Rhythm regular, heart sounds  normal, no murmurs or gallops, no peripheral edema Musculoskeletal: No deformities, no cyanosis or clubbing      Impression & Recommendations:  Problem # 1:  COPD (ICD-496) Assessment Improved Good benefit of pulm rehab OK to try longer periods off O2 at rest - continue to use on exertion & during sleep vaccinations uptodate  Her updated medication list for this problem includes:    Symbicort 160-4.5 Mcg/act Aero (Budesonide-formoterol fumarate) .Marland Kitchen..Marland Kitchen Two puffs first thing in am and again in pm    Spiriva Handihaler 18 Mcg Caps (Tiotropium bromide monohydrate) ..... Inhale contents of 1 capsule once a day    Proventil Hfa 108 (90 Base) Mcg/act Aers (Albuterol sulfate) .Marland Kitchen... 1-2 puffs every 4-6 hours as needed for wheezing    Albuterol  Sulfate (2.5 Mg/14ml) 0.083% Nebu (Albuterol sulfate) ..... Inhale 1 vial via nebulizer every 4-6 hours as needed  Medications Added to Medication List This Visit: 1)  Alprazolam 0.5 Mg Tabs (Alprazolam) .... Take 1 tablet by mouth every morning and take 2 tab by mouth at bedtime 2)  Mucinex Dm 30-600 Mg Tb12 (Dextromethorphan-guaifenesin) .... Take 1 tablet by mouth two times a day  Other Orders: Est. Patient Level III (45409)  Patient Instructions: 1)  Copy sent to: dr Artist Pais 2)  Please schedule a follow-up appointment in 4 months with TP 3)  Check oxygen level at rest  4)  we discussed signs of a copd flare

## 2010-11-30 NOTE — Assessment & Plan Note (Signed)
Summary: sob/cb   Visit Type:  Acute visit Primary Provider/Referring Provider:  Dondra Spry DO  CC:  Pt c/o increased SOB with any activity, chest congestion and tightness, fatigue, and burning in middle back .  History of Present Illness: 40 yowf  last smoked 2002 with GOLD stg IV COPD FEV1 26% 6/10 on 24 h O2 since 2009.  October 12, 2008 ov with worsening doe x 2 months to point where can only go 50 feet  with neg cardiac w/u  and subjective wheezing better with nebulizer.  no cough but quite hoarse.   breathing  no better on advair, hoarseness worse--Advair stopped-changed to Symbicort   November 02, 2008-f/u ov.  tried change to Brovana and Budesonide per neb but liked symbicort better.  May 30, 2010 9:39 AM  -  Dyspnea worse, pred taper started by Dr Artist Pais. CXR 5/11 hyperinflation  August 30, 2010 1:47 PM  Started pulm rehab october 1 st, doing well - can walk 11 laps now Has been able to stay off O2 at rest, albuterol MDI lasts 2 months   September 28, 2010 4:13 PM  c/o increased dyspnea, hoarse, no wheeze, no cough, no edema  Pt denies any significant sore throat, dyphagia, itching, sneezing,  nasal congestion or excess secretions or cough,  fever, chills, sweats, unintended wt loss, pleuritic or exertional cp, change in activity tolerance  orthopnea pnd or leg swelling.     Preventive Screening-Counseling & Management  Alcohol-Tobacco     Alcohol drinks/day: <1     Alcohol type: wine     Smoking Status: quit     Packs/Day: 2.0     Year Started: 1962     Year Quit: 2001  Current Medications (verified): 1)  Symbicort 160-4.5 Mcg/act  Aero (Budesonide-Formoterol Fumarate) .... Two Puffs First Thing in Am and Again in Pm 2)  Spiriva Handihaler 18 Mcg  Caps (Tiotropium Bromide Monohydrate) .... Inhale Contents of 1 Capsule Once A Day 3)  Alprazolam 0.5 Mg  Tabs (Alprazolam) .... Take 1 Tablet By Mouth Every Morning and Take 2 Tab By Mouth At Bedtime 4)  Zegerid  40-1100 Mg  Caps (Omeprazole-Sodium Bicarbonate) .... Two Times A Day 5)  Calcium Carbonate-Vitamin D 600-400 Mg-Unit  Tabs (Calcium Carbonate-Vitamin D) .... Take 1 Tablet By Mouth Two Times A Day 6)  Vitamin D 1000 Unit Tabs (Cholecalciferol) .... Take 1 Tablet By Mouth Once A Day 7)  Oxygen .... Use  3 Liters Daily 8)  Proventil Hfa 108 (90 Base) Mcg/act  Aers (Albuterol Sulfate) .Marland Kitchen.. 1-2 Puffs Every 4-6 Hours As Needed For Wheezing 9)  Albuterol Sulfate (2.5 Mg/32ml) 0.083%  Nebu (Albuterol Sulfate) .... Inhale 1 Vial Via Nebulizer Every 4-6 Hours As Needed 10)  Mucinex Dm 30-600 Mg  Tb12 (Dextromethorphan-Guaifenesin) .... Take 1 Tablet By Mouth Two Times A Day 11)  Coenzyme Q10 100 Mg Caps (Coenzyme Q10) .Marland Kitchen.. 1 Once Daily 12)  Mirtazapine 15 Mg Tabs (Mirtazapine) .... One By Mouth At Bedtime  Allergies (verified): 1)  ! Morphine  Past History:  Past Medical History: Last updated: 08/02/2010 COPD...................................................................................................Marland KitchenWert      -PFTs 09/26/2006 with FEV1 33%, ratio 30% and no reversibility.  Diffusing capacity 47%     -PFT's  04/21/2009                   26%  ratio 34  no reversiblity and dlco 30%     -HFA 75% effective April 21, 2009   Hypothyroidism - subclinical  glucose intolerance    Colitis     H1N1-- November 02, 2008   COLONIC POLYPS, ADENOMATOUS, HX OF (ICD-V12.72) SMALL BOWEL OBSTRUCTION, HX OF (ICD-V12.79) NEED PROPHYLACTIC VACCINATION&INOCULATION FLU (ICD-V04.81) ARTHRALGIA (ICD-719.40) DYSPNEA (ICD-786.05) PALLOR (ICD-782.61) CHEST PAIN (ICD-786.50) OSTEOPOROSIS (ICD-733.00) CHRONIC OBSTRUCTIVE PULMONARY DISEASE, ACUTE EXACERBATION (ICD-491.21) ACUTE BRONCHITIS (ICD-466.0) WEIGHT LOSS (ICD-783.21) DYSURIA (ICD-788.1) ELEVATED BLOOD PRESSURE WITHOUT DIAGNOSIS OF HYPERTENSION (ICD-796.2) DIARRHEA (ICD-787.91) SMALL BOWEL OBSTRUCTION (ICD-560.9) COLITIS (ICD-558.9) EDEMA  (ICD-782.3) WEAKNESS (ICD-780.79) GLUCOSE INTOLERANCE (ICD-271.3) LEUKOCYTOSIS (ICD-288.60) ABDOMINAL PAIN, LEFT LOWER QUADRANT (ICD-789.04) HOARSENESS (ICD-784.49) HYPOTHYROIDISM (ICD-244.9) ANXIETY (ICD-300.00) DEPRESSION (ICD-311) MIGRAINE HEADACHE (ICD-346.90) HYPERTENSION, BENIGN ESSENTIAL (ICD-401.1)  GERD (ICD-530.81) COPD (ICD-496)      Social History: Last updated: 08/02/2010 Former Smoker - q 2003, prev 2 ppd smoker  Married - lives with husband  Occupation: retired [employer] logistics/support/administrative officer    Occasional ETOH                   Review of Systems       The patient complains of dyspnea on exertion.  The patient denies anorexia, fever, weight loss, weight gain, vision loss, decreased hearing, hoarseness, chest pain, syncope, peripheral edema, prolonged cough, headaches, hemoptysis, abdominal pain, melena, hematochezia, severe indigestion/heartburn, hematuria, muscle weakness, suspicious skin lesions, transient blindness, difficulty walking, depression, unusual weight change, abnormal bleeding, enlarged lymph nodes, and angioedema.    Vital Signs:  Patient profile:   70 year old female Height:      60 inches Weight:      103.8 pounds BMI:     20.35 O2 Sat:      97 % on 3 L/min Temp:     97.5 degrees F oral Pulse rate:   97 / minute BP sitting:   122 / 70  (left arm) Cuff size:   regular  Vitals Entered By: Zackery Barefoot CMA (September 28, 2010 4:02 PM)  O2 Flow:  3 L/min CC: Pt c/o increased SOB with any activity, chest congestion and tightness, fatigue, burning in middle back  Comments Medications reviewed with patient Verified contact number and pharmacy with patient Zackery Barefoot [facility]  September 28, 2010 4:03 PM    Physical Exam  Additional Exam:  wt 107 August 30, 2010  Gen. Pleasant, petite, in no distress, normal affect, on 3L O2 ENT - no lesions, no post nasal drip Neck: No JVD, no thyromegaly, no carotid bruits Lungs: no use of accessory  muscles, no dullness to percussion, decreased  without rales or rhonchi  Cardiovascular: Rhythm regular, heart sounds  normal, no murmurs or gallops, no peripheral edema Musculoskeletal: No deformities, no cyanosis or clubbing      Impression & Recommendations:  Problem # 1:  CHRONIC OBSTRUCTIVE PULMONARY DISEASE, ACUTE EXACERBATION (ICD-491.21)  Pred 20 mg x 5 ds, then 10 mg x  5ds then stop ct symbicort, spiriva ct rehab  Orders: Est. Patient Level III (04540)  Medications Added to Medication List This Visit: 1)  Prednisone 10 Mg Tabs (Prednisone) .... 2 tabs once daily x 5 days, then 1 tab once daily  Patient Instructions: 1)  Copy sent to: 2)  Prednisone Rx sent  3)  Call for colored phlegm Prescriptions: PREDNISONE 10 MG TABS (PREDNISONE) 2 tabs once daily x 5 days, then 1 tab once daily  #15 x 0   Entered and Authorized by:   Comer Locket Vassie Loll MD   Signed by:   Comer Locket Vassie Loll MD on 09/28/2010  Method used:   Electronically to        [facility]. [phone]* (retail)       [address]       [place], [place]  [address]       Ph: [phone]       Fax: [phone]   RxID:   [id number]

## 2010-11-30 NOTE — Progress Notes (Signed)
Summary: DME form for O2 ?  Phone Note Other Incoming   Caller: Wilma @ [employer]  [phone] Summary of Call: Received voice message wanting to know status of DME form for pt's O2.  Advised Wilma that we did not receive form. Form was faxed to incorrect number and correct fax # was given to [facility] and she will refax form.  Nicki Guadalajara Fergerson CMA Duncan Dull)  May 11, 2010 12:09 PM   Follow-up for Phone Call        Received DME form. After reviewing chart, saw that Dr. Sherene Sires initiated O2 for this pt. Contacted [employer] and notified Wilma that the form needs to be sent to Dr. Sherene Sires. Contact numbers were given to [facility].  Nicki Guadalajara Fergerson CMA Duncan Dull)  May 11, 2010 1:43 PM

## 2010-11-30 NOTE — Assessment & Plan Note (Signed)
Summary: DISCUSS MEDS & FLU SHOT/DT   Vital Signs:  Patient profile:   70 year old female Weight:      106.75 pounds BMI:     20.92 O2 Sat:      100 % on 2 L/min Temp:     97.6 degrees F oral Pulse rate:   81 / minute Pulse rhythm:   irregular Resp:     26 per minute BP sitting:   100 / 70  (right arm) Cuff size:   regular  Vitals Entered By: Glendell Docker CMA (August 02, 2010 10:14 AM)  O2 Flow:  2 L/min CC: follow-up visit Is Patient Diabetic? No Pain Assessment Patient in pain? no      Comments started pulmonary rehab- 12 weeks. no concerns   Primary Care Reesa Gotschall:  Dondra Spry DO  CC:  follow-up visit.  History of Present Illness: 70 y/o white female with hx of severe COPD for f/u pt started pulm rehab had first exercise session encouraged by chance of improvement  since starting mirtazapine improved appetite, wt gain, and mood   Preventive Screening-Counseling & Management  Alcohol-Tobacco     Smoking Status: quit  Allergies: 1)  ! Morphine  Past History:  Past Medical History: COPD...................................................................................................Marland KitchenWert      -PFTs 09/26/2006 with FEV1 33%, ratio 30% and no reversibility.  Diffusing capacity 47%     -PFT's  04/21/2009                   26%  ratio 34  no reversiblity and dlco 30%     -HFA 75% effective April 21, 2009   Hypothyroidism - subclinical  glucose intolerance    Colitis     H1N1-- November 02, 2008   COLONIC POLYPS, ADENOMATOUS, HX OF (ICD-V12.72) SMALL BOWEL OBSTRUCTION, HX OF (ICD-V12.79) NEED PROPHYLACTIC VACCINATION&INOCULATION FLU (ICD-V04.81) ARTHRALGIA (ICD-719.40) DYSPNEA (ICD-786.05) PALLOR (ICD-782.61) CHEST PAIN (ICD-786.50) OSTEOPOROSIS (ICD-733.00) CHRONIC OBSTRUCTIVE PULMONARY DISEASE, ACUTE EXACERBATION (ICD-491.21) ACUTE BRONCHITIS (ICD-466.0) WEIGHT LOSS (ICD-783.21) DYSURIA (ICD-788.1) ELEVATED BLOOD PRESSURE WITHOUT DIAGNOSIS OF  HYPERTENSION (ICD-796.2) DIARRHEA (ICD-787.91) SMALL BOWEL OBSTRUCTION (ICD-560.9) COLITIS (ICD-558.9) EDEMA (ICD-782.3) WEAKNESS (ICD-780.79) GLUCOSE INTOLERANCE (ICD-271.3) LEUKOCYTOSIS (ICD-288.60) ABDOMINAL PAIN, LEFT LOWER QUADRANT (ICD-789.04) HOARSENESS (ICD-784.49) HYPOTHYROIDISM (ICD-244.9) ANXIETY (ICD-300.00) DEPRESSION (ICD-311) MIGRAINE HEADACHE (ICD-346.90) HYPERTENSION, BENIGN ESSENTIAL (ICD-401.1)  GERD (ICD-530.81) COPD (ICD-496)     SH/Risk Factors reviewed for relevance  Family History: mother with alcoholism, heart dz, stroke, HTN, renal disease   Family History of Colon Cancer: Paternal Aunt Family History of Esophageal Cancer:  Family History of Prostate Cancer: Family History of Heart Disease: Mother   brother in [place] died from copd - age 67          Social History: Former Smoker - q 2003, prev 2 ppd smoker  Married - lives with husband  Occupation: retired Armed forces logistics/support/administrative officer    Occasional ETOH                   Physical Exam  General:  alert and underweight appearing.   Lungs:  increased resp effort,  decreased breath sounds bilaterally,  no dullness to percussion no wheezes.   Heart:  normal rate and no gallop.  heart sounds distant Psych:  normally interactive and good eye contact.     Impression & Recommendations:  Problem # 1:  COPD (ICD-496) Assessment Improved  Her updated medication list for this problem includes:    Symbicort 160-4.5 Mcg/act Aero (Budesonide-formoterol fumarate) .Marland Kitchen..Marland Kitchen Two puffs first thing in  am and again in pm    Spiriva Handihaler 18 Mcg Caps (Tiotropium bromide monohydrate) ..... Inhale contents of 1 capsule once a day    Proventil Hfa 108 (90 Base) Mcg/act Aers (Albuterol sulfate) .Marland Kitchen... 1-2 puffs every 4-6 hours as needed for wheezing    Albuterol Sulfate (2.5 Mg/67ml) 0.083% Nebu (Albuterol sulfate) ..... Inhale 1 vial via nebulizer every 4-6 hours as needed  Problem # 2:  DEPRESSION  (ICD-311) Assessment: Improved  Her updated medication list for this problem includes:    Alprazolam 0.5 Mg Tabs (Alprazolam) .Marland Kitchen... Take 1 tablet by mouth four times a day    Mirtazapine 15 Mg Tabs (Mirtazapine) ..... One by mouth at bedtime  Complete Medication List: 1)  Symbicort 160-4.5 Mcg/act Aero (Budesonide-formoterol fumarate) .... Two puffs first thing in am and again in pm 2)  Spiriva Handihaler 18 Mcg Caps (Tiotropium bromide monohydrate) .... Inhale contents of 1 capsule once a day 3)  Alprazolam 0.5 Mg Tabs (Alprazolam) .... Take 1 tablet by mouth four times a day 4)  Zegerid 40-1100 Mg Caps (Omeprazole-sodium bicarbonate) .... Two times a day 5)  Calcium Carbonate-vitamin D 600-400 Mg-unit Tabs (Calcium carbonate-vitamin d) .... Take 1 tablet by mouth two times a day 6)  Vitamin D 1000 Unit Tabs (Cholecalciferol) .... Take 1 tablet by mouth once a day 7)  Oxygen  .... Use  3 liters daily 8)  Proventil Hfa 108 (90 Base) Mcg/act Aers (Albuterol sulfate) .Marland Kitchen.. 1-2 puffs every 4-6 hours as needed for wheezing 9)  Albuterol Sulfate (2.5 Mg/68ml) 0.083% Nebu (Albuterol sulfate) .... Inhale 1 vial via nebulizer every 4-6 hours as needed 10)  Mucinex Dm 30-600 Mg Tb12 (Dextromethorphan-guaifenesin) .Marland Kitchen.. 1-2 every 12 hours as needed 11)  Coenzyme Q10 100 Mg Caps (Coenzyme q10) .Marland Kitchen.. 1 once daily 12)  Mirtazapine 15 Mg Tabs (Mirtazapine) .... One by mouth at bedtime  Other Orders: Influenza Vaccine MCR (51761) Pneumococcal Vaccine (60737) Admin 1st Vaccine (10626) Flu Vaccine 77yrs + MEDICARE PATIENTS (R4854)  Patient Instructions: 1)  Please schedule a follow-up appointment in 6 months. Prescriptions: PROVENTIL HFA 108 (90 BASE) MCG/ACT  AERS (ALBUTEROL SULFATE) 1-2 puffs every 4-6 hours as needed for wheezing  #1 x 1   Entered by:   Glendell Docker CMA   Authorized by:   D. Thomos Lemons DO   Signed by:   Glendell Docker CMA on 08/02/2010   Method used:   Reprint   RxID:    [id number] ZEGERID 40-1100 MG  CAPS (OMEPRAZOLE-SODIUM BICARBONATE) two times a day  #180 x 3   Entered by:   Glendell Docker CMA   Authorized by:   D. Thomos Lemons DO   Signed by:   Glendell Docker CMA on 08/02/2010   Method used:   Reprint   RxID:   [id number] ALPRAZOLAM 0.5 MG  TABS (ALPRAZOLAM) Take 1 tablet by mouth four times a day  #360 x 0   Entered by:   Glendell Docker CMA   Authorized by:   D. Thomos Lemons DO   Signed by:   Glendell Docker CMA on 08/02/2010   Method used:   Reprint   RxID:   [id number] SYMBICORT 160-4.5 MCG/ACT  AERO (BUDESONIDE-FORMOTEROL FUMARATE) Two puffs first thing in am and again in pm  #3 x 3   Entered by:   Glendell Docker CMA   Authorized by:   D. Thomos Lemons DO   Signed by:   Glendell Docker CMA on 08/02/2010   Method used:  Reprint   RxID:   [id number] MIRTAZAPINE 15 MG TABS (MIRTAZAPINE) one by mouth at bedtime  #90 x 3   Entered by:   Glendell Docker CMA   Authorized by:   D. Thomos Lemons DO   Signed by:   D. Thomos Lemons DO on 08/02/2010   Method used:   Print then Give to Patient   RxID:   [id number] ALBUTEROL SULFATE (2.5 MG/3ML) 0.083%  NEBU (ALBUTEROL SULFATE) inhale 1 vial via nebulizer every 4-6 hours as needed  #1 month x 3   Entered by:   Glendell Docker CMA   Authorized by:   D. Thomos Lemons DO   Signed by:   D. Thomos Lemons DO on 08/02/2010   Method used:   Print then Give to Patient   RxID:   [id number] PROVENTIL HFA 108 (90 BASE) MCG/ACT  AERS (ALBUTEROL SULFATE) 1-2 puffs every 4-6 hours as needed for wheezing  #1 x 1   Entered by:   Glendell Docker CMA   Authorized by:   D. Thomos Lemons DO   Signed by:   D. Thomos Lemons DO on 08/02/2010   Method used:   Print then Give to Patient   RxID:   [id number] ZEGERID 40-1100 MG  CAPS (OMEPRAZOLE-SODIUM BICARBONATE) two times a day  #180 x 3   Entered by:   Glendell Docker CMA   Authorized by:   D. Thomos Lemons DO   Signed by:   D. Thomos Lemons DO on 08/02/2010    Method used:   Print then Give to Patient   RxID:   [id number] SYMBICORT 160-4.5 MCG/ACT  AERO (BUDESONIDE-FORMOTEROL FUMARATE) Two puffs first thing in am and again in pm  #0 x 0   Entered by:   Glendell Docker CMA   Authorized by:   D. Thomos Lemons DO   Signed by:   D. Thomos Lemons DO on 08/02/2010   Method used:   Print then Give to Patient   RxID:   [id number] ALPRAZOLAM 0.5 MG  TABS (ALPRAZOLAM) Take 1 tablet by mouth four times a day  #0 x 0   Entered by:   Glendell Docker CMA   Authorized by:   D. Thomos Lemons DO   Signed by:   D. Thomos Lemons DO on 08/02/2010   Method used:   Print then Give to Patient   RxID:   [id number]   Current Allergies (reviewed today): ! MORPHINE    Immunizations Administered:  Influenza Vaccine # 1:    Vaccine Type: Fluvax MCR    Site: right deltoid    Mfr: GlaxoSmithKline    Dose: 0.5 ml    Route: IM    Given by: Glendell Docker CMA    Exp. Date: 04/28/2011    Lot #: [id number]    VIS given: 05/23/10 version given August 02, 2010.  Pneumonia Vaccine:    Vaccine Type: Pneumovax (Medicare)    Site: left deltoid    Mfr: Merck    Dose: 0.5 ml    Route: IM    Given by: Glendell Docker CMA    Exp. Date: 01/11/2012    Lot #: [id number]    VIS given: 10/03/09 version given August 02, 2010.  Flu Vaccine Consent Questions:    Do you have a history of severe allergic reactions to this vaccine? no    Any prior history of allergic reactions to egg and/or gelatin? no    Do you have a sensitivity to the  preservative Thimersol? no    Do you have a past history of Guillan-Barre Syndrome? no    Do you currently have an acute febrile illness? no    Have you ever had a severe reaction to latex? no    Vaccine information given and explained to patient? yes    Are you currently pregnant? no

## 2010-11-30 NOTE — Letter (Signed)
Summary: Colonoscopy Letter  [facility]  [address] [place], [place] [address]   Phone: [phone]  Fax: [phone]      January 12, 2010 MRN: [id number]   [facility] [address] [place], [place]  [address]   Dear Ms. Schmale,   According to your medical record, it is time for you to schedule a Colonoscopy. The American Cancer Society recommends this procedure as a method to detect early colon cancer. Patients with a family history of colon cancer, or a personal history of colon polyps or inflammatory bowel disease are at increased risk.  This letter has beeen generated based on the recommendations made at the time of your procedure. If you feel that in your particular situation this may no longer apply, please contact our office.  Please call our office at [phone] to schedule this appointment or to update your records at your earliest convenience.  Thank you for cooperating with [place] to provide you with the very best care possible.   Sincerely,  Hedwig Morton. Juanda Chance, M.D.  [facility] [phone]

## 2010-11-30 NOTE — Assessment & Plan Note (Signed)
Summary: 2 mo. f/u - jr   Vital Signs:  Patient profile:   70 year old female Height:      60 inches Weight:      106 pounds BMI:     20.78 O2 Sat:      97 % on 3 L/min Temp:     97.8 degrees F oral Pulse rate:   98 / minute Pulse rhythm:   irregular BP sitting:   110 / 70  (right arm) Cuff size:   regular  Vitals Entered By: Glendell Docker CMA (Mar 10, 2010 3:42 PM)  O2 Flow:  3 L/min CC: Rm 3-  2  month followup disease management Comments c/o tightness in chest and increase  of breathing  difficulty, waking up daily with headaches, and constant bruising. Tylenol taken with no relief.   Primary Care Provider:  DThomos Lemons DO  CC:  Rm 3-  2  month followup disease management.  History of Present Illness:       This is a 70 year old female who presents with COPD exacerbation.  The patient complains of history of diagnosed COPD, shortness of breath, chest tightness, and cough.  she thinks she aspirated piece of rice.  It is hard for her to cough but she was able cough up.  GERD - taking PPI two times a day but still uses tums at night for reflux symptoms   Allergies: 1)  ! Morphine  Past History:  Past Medical History: COPD...................................................................................................Marland KitchenWert      -PFTs 09/26/2006 with FEV1 33%, ratio 30% and no reversibility.  Diffusing capacity 47%     -PFT's  04/21/2009                   26%  ratio 34  no reversiblity and dlco 30%     -HFA 75% effective April 21, 2009   Hypothyroidism - subclinical glucose intolerance    Colitis    Complex Med regimen--Meds reviewed with pt education and computerized med calendar completed/adjusted. November 02, 2008  H1N1-- November 02, 2008   COLONIC POLYPS, ADENOMATOUS, HX OF (ICD-V12.72) SMALL BOWEL OBSTRUCTION, HX OF (ICD-V12.79) NEED PROPHYLACTIC VACCINATION&INOCULATION FLU (ICD-V04.81) ARTHRALGIA (ICD-719.40) DYSPNEA (ICD-786.05) PALLOR  (ICD-782.61) CHEST PAIN (ICD-786.50) OSTEOPOROSIS (ICD-733.00) CHRONIC OBSTRUCTIVE PULMONARY DISEASE, ACUTE EXACERBATION (ICD-491.21) ACUTE BRONCHITIS (ICD-466.0) WEIGHT LOSS (ICD-783.21) DYSURIA (ICD-788.1) ELEVATED BLOOD PRESSURE WITHOUT DIAGNOSIS OF HYPERTENSION (ICD-796.2) DIARRHEA (ICD-787.91) SMALL BOWEL OBSTRUCTION (ICD-560.9) COLITIS (ICD-558.9) EDEMA (ICD-782.3) WEAKNESS (ICD-780.79) GLUCOSE INTOLERANCE (ICD-271.3) LEUKOCYTOSIS (ICD-288.60) ABDOMINAL PAIN, LEFT LOWER QUADRANT (ICD-789.04) HOARSENESS (ICD-784.49) HYPOTHYROIDISM (ICD-244.9) ANXIETY (ICD-300.00) DEPRESSION (ICD-311) MIGRAINE HEADACHE (ICD-346.90) HYPERTENSION, BENIGN ESSENTIAL (ICD-401.1) GERD (ICD-530.81) COPD (ICD-496)      Past Surgical History: Cholecystectomy Hysterectomy- 1993  Bladder Repair         Tubal Ligation       Family History: mother with alcoholism, heart dz, stroke, HTN, renal disease   Family History of Colon Cancer: Paternal Aunt Family History of Esophageal Cancer:  Family History of Prostate Cancer: Family History of Heart Disease: Mother   brother in [place] died from copd - age 9        Social History: Former Smoker - q 2003, prev 2 ppd smoker  Married - lives with husband  Occupation: Armed forces logistics/support/administrative officer    Occasional ETOH                  Review of Systems       taking zegerid two times a day.  still gets  night time reflux symptoms      See HPI for Pulmonary and Cardiac review of systems.  Physical Exam  General:  alert and underweight appearing.   Lungs:  increased respiratory effort.    distant breath sounds,  prolonged expiration Heart:  normal rate and no gallop.   Extremities:  No lower extremity edema  Psych:  normally interactive, good eye contact, not anxious appearing, and not depressed appearing.     Impression & Recommendations:  Problem # 1:  CHRONIC OBSTRUCTIVE PULMONARY DISEASE, ACUTE EXACERBATION (ICD-491.21) Pt with increased  chest tightness and cough.  she thinks she may have aspirated piece of rice.  she was able to cough up. tx with ceftin and prednisone taper. Pt advised to have xray today but deferred due to ins reasons.   she will have x ray at "non hospital" facility  Problem # 2:  GERD (ICD-530.81) breakthrough GERD symptoms despite zegerid.  raise head of bed.  Her updated medication list for this problem includes:    Zegerid 40-1100 Mg Caps (Omeprazole-sodium bicarbonate) .Marland Kitchen..Marland Kitchen Two times a day    Bentyl 20 Mg Tabs (Dicyclomine hcl) .Marland Kitchen... Take one every morning  Complete Medication List: 1)  Symbicort 160-4.5 Mcg/act Aero (Budesonide-formoterol fumarate) .... Two puffs first thing in am and again in pm 2)  Spiriva Handihaler 18 Mcg Caps (Tiotropium bromide monohydrate) .... Inhale contents of 1 capsule once a day 3)  Alprazolam 0.5 Mg Tabs (Alprazolam) .... Take 1 tablet by mouth four times a day 4)  Zegerid 40-1100 Mg Caps (Omeprazole-sodium bicarbonate) .... Two times a day 5)  Calcium Carbonate-vitamin D 600-400 Mg-unit Tabs (Calcium carbonate-vitamin d) .... Take 1 tablet by mouth two times a day 6)  Vitamin D 1000 Unit Tabs (Cholecalciferol) .... Take 1 tablet by mouth once a day 7)  Align Caps (Misc intestinal flora regulat) .Marland Kitchen.. 1 once daily 8)  Oxygen  .... Use  3 liters daily 9)  Proventil Hfa 108 (90 Base) Mcg/act Aers (Albuterol sulfate) .Marland Kitchen.. 1-2 puffs every 4-6 hours as needed for wheezing 10)  Albuterol Sulfate (2.5 Mg/33ml) 0.083% Nebu (Albuterol sulfate) .... Inhale 1 vial via nebulizer every 4-6 hours as needed 11)  Mucinex Dm 30-600 Mg Tb12 (Dextromethorphan-guaifenesin) .Marland Kitchen.. 1-2 every 12 hours as needed 12)  Metamucil Plus Calcium Caps (Psyllium-calcium) .... Take 1 tablet by mouth two times a day 13)  Coenzyme Q10 100 Mg Caps (Coenzyme q10) .Marland Kitchen.. 1 once daily 14)  Xifaxan 200 Mg Tabs (Rifaximin) .... One by mouth three times a day 15)  Mirtazapine 15 Mg Tabs (Mirtazapine) .... One by  mouth at bedtime 16)  Bentyl 20 Mg Tabs (Dicyclomine hcl) .... Take one every morning 17)  Cefuroxime Axetil 500 Mg Tabs (Cefuroxime axetil) .... One by mouth two times a day 18)  Prednisone 10 Mg Tabs (Prednisone) .... 4 tabs by mouth q.d. x 3 days, 3 tabs by mouth q.d. x 3 days, 2 tabs p.o. once daily x 3 days, 1 tab p.o. x 3 days  Other Orders: T-2 View CXR (71020TC)  Patient Instructions: 1)  Call our office if your symptoms do not  improve or gets worse. 2)  Please schedule a follow-up appointment in 3 months. 3)  Raise head of bed 6-8 inches Prescriptions: PREDNISONE 10 MG TABS (PREDNISONE) 4 tabs by mouth q.d. x 3 days, 3 tabs by mouth q.d. x 3 days, 2 tabs p.o. once daily x 3 days, 1 tab p.o. x 3 days  #30 x 0  Entered and Authorized by:   D. Thomos Lemons DO   Signed by:   D. Thomos Lemons DO on 03/10/2010   Method used:   Electronically to        [facility]. [phone]* (retail)       [address]       [place], [place]  [address]       Ph: [phone]       Fax: [phone]   RxID:   [id number] CEFUROXIME AXETIL 500 MG TABS (CEFUROXIME AXETIL) one by mouth two times a day  #20 x 0   Entered and Authorized by:   D. Thomos Lemons DO   Signed by:   D. Thomos Lemons DO on 03/10/2010   Method used:   Electronically to        [facility]. [phone]* (retail)       [address]       [place], [place]  [address]       Ph: [phone]       Fax: [phone]   RxID:   [id number]   Current Allergies (reviewed today): ! MORPHINE

## 2010-11-30 NOTE — Letter (Signed)
Summary: Patient Notice- Polyp Results  [facility]  [address] [place], [place] [address]   Phone: [phone]  Fax: [phone]        Mar 03, 2010 MRN: [id number]    [facility] [address] [place], [place]  [address]    Dear Ms. Brodowski,  I am pleased to inform you that the colon polyp(s) removed during your recent colonoscopy was (were) found to be benign (no cancer detected) upon pathologic examination.All polyps were adenomatous ( precancerous)  I recommend you have a repeat colonoscopy examination in 3_ years to look for recurrent polyps, as having colon polyps increases your risk for having recurrent polyps or even colon cancer in the future.  Should you develop new or worsening symptoms of abdominal pain, bowel habit changes or bleeding from the rectum or bowels, please schedule an evaluation with either your primary care physician or with me.  Additional information/recommendations:  _x_ No further action with gastroenterology is needed at this time. Please      follow-up with your primary care physician for your other healthcare      needs.  __ Please call [phone] to schedule a return visit to review your      situation.  __ Please keep your follow-up visit as already scheduled.  __ Continue treatment plan as outlined the day of your exam.  Please call us if you are having persistent problems or have questions about your condition that have not been fully answered at this time.  Sincerely,  Hart Carwin MD  This letter has been electronically signed by your physician.  Appended Document: Patient Notice- Polyp Results letter mailed 5.11.11

## 2010-11-30 NOTE — Miscellaneous (Signed)
Summary: Care Plan/[facility]  Care Plan/[facility]   Imported By: Lanelle Bal 02/10/2010 12:29:20  _____________________________________________________________________  External Attachment:    Type:   Image     Comment:   External Document

## 2010-11-30 NOTE — Progress Notes (Signed)
Summary: CXR  Phone Note Outgoing Call   Summary of Call: call pt - no acute change noted on chest xay.  chronic copd Initial call taken by: D. Thomos Lemons DO,  Mar 13, 2010 4:20 PM  Follow-up for Phone Call        attempted to contact patient at [phone], no answer, voice message left for patient to return call regarding cxr Follow-up by: Glendell Docker CMA,  Mar 13, 2010 4:37 PM  Additional Follow-up for Phone Call Additional follow up Details #1::        patient advised per Dr Artist Pais instructions Additional Follow-up by: Glendell Docker CMA,  Mar 14, 2010 8:44 AM

## 2010-11-30 NOTE — Procedures (Signed)
Summary: Colonoscopy  Patient: [name] Note: All result statuses are Final unless otherwise noted.  Tests: (1) Colonoscopy (COL)   COL Colonoscopy           DONE     [facility]     [address]     [place], [place]  [address]           COLONOSCOPY PROCEDURE REPORT           PATIENT:  [name], [name]  MR#:  [id number]     BIRTHDATE:  12/01/1940, 68 yrs. old  GENDER:  female     ENDOSCOPIST:  Hedwig Morton. Juanda Chance, MD     REF. BY:     PROCEDURE DATE:  03/01/2010     PROCEDURE:  Colonoscopy 29562     ASA CLASS:  Class I     INDICATIONS:  surveillance large multilobulated polyp removed     2009, this is a follow up     MEDICATIONS:   Versed 9 mg, Fentanyl 75 mcg, Benadryl 25 mg           DESCRIPTION OF PROCEDURE:   After the risks benefits and     alternatives of the procedure were thoroughly explained, informed     consent was obtained.  No rectal exam performed. The [id number]     endoscope was introduced through the anus and advanced to the     cecum, which was identified by both the appendix and ileocecal     valve, without limitations.  The quality of the prep was good,     using MiraLax.  The instrument was then slowly withdrawn as the     colon was fully examined.     <<PROCEDUREIMAGES>>           FINDINGS:  There were multiple polyps identified and removed.     throughout the colon. 15 mm ped. polyp at 20 cm snared, 4 mm flat     polyp at 70 cm removed, 2 cecal polyps 6 mm and 15 mm removed in     pieces with hot snare The polyps were removed using cold biopsy     forceps. Polyp was snared without cautery. Retrieval was     successful. snare polyp Polyps were snared without cautery.     Retrieval was successful (see image2, image1, image3, image7,     image8, and image5). snare polyp  This was otherwise a normal     examination of the colon (see image4, image5, and image9).     Retroflexed views in the rectum revealed no abnormalities.    The     scope  was then withdrawn from the patient and the procedure     completed.           COMPLICATIONS:  None     ENDOSCOPIC IMPRESSION:     1) Polyps, multiple throughout the colon     2) Otherwise normal examination     4 polyps removed     RECOMMENDATIONS:     1) Await pathology results     2) High fiber diet.     REPEAT EXAM:  In 3 year(s) for.           ______________________________     Hedwig Morton. Juanda Chance, MD           CC:           n.     eSIGNED:   Hedwig Morton. Emoree Sasaki at 03/01/2010 01:01  PM           Jinnifer, [place], [phone]  Note: An exclamation mark (!) indicates a result that was not dispersed into the flowsheet. Document Creation Date: 03/01/2010 1:01 PM _______________________________________________________________________  (1) Order result status: Final Collection or observation date-time: 03/01/2010 12:51 Requested date-time:  Receipt date-time:  Reported date-time:  Referring Physician:   Ordering Physician: [name] [phone]) Specimen Source:  Source: Launa Grill Order Number: [id number] Lab site:   Appended Document: Colonoscopy     Procedures Next Due Date:    Colonoscopy: 02/2013  Appended Document: Colonoscopy     Procedures Next Due Date:    Colonoscopy: 02/2013

## 2010-11-30 NOTE — Letter (Signed)
Summary: Previsit letter  [facility]  [address] [place], [place] [address]   Phone: [phone]  Fax: [phone]       02/01/2010 MRN: [id number]  [facility] [address] [place], [place]  [address]  Dear Ms. Tromp,  Welcome to the Gastroenterology Division at [facility].    You are scheduled to see a nurse for your pre-procedure visit on 02-22-10 at 1pm on the 3rd floor at [facility], [address]  We ask that you try to arrive at our office 15 minutes prior to your appointment time to allow for check-in.  Your nurse visit will consist of discussing your medical and surgical history, your immediate family medical history, and your medications.    Please bring a complete list of all your medications or, if you prefer, bring the medication bottles and we will list them.  We will need to be aware of both prescribed and over the counter drugs.  We will need to know exact dosage information as well.  If you are on blood thinners (Coumadin, Plavix, Aggrenox, Ticlid, etc.) please call our office today/prior to your appointment, as we need to consult with your physician about holding your medication.   Please be prepared to read and sign documents such as consent forms, a financial agreement, and acknowledgement forms.  If necessary, and with your consent, a friend or relative is welcome to sit-in on the nurse visit with you.  Please bring your insurance card so that we may make a copy of it.  If your insurance requires a referral to see a specialist, please bring your referral form from your primary care physician.  No co-pay is required for this nurse visit.     If you cannot keep your appointment, please call [phone] to cancel or reschedule prior to your appointment date.  This allows [place] the opportunity to schedule an appointment for another patient in need of care.    Thank you for choosing [facility] for your medical needs.   We appreciate the opportunity to care for you.  Please visit [place] at our website  to learn more about our practice.                     Sincerely.                                                                                                                   The Gastroenterology Division

## 2010-11-30 NOTE — Assessment & Plan Note (Signed)
Summary: short of breath and congestion/mhf   Vital Signs:  Patient profile:   70 year old female Weight:      103 pounds BMI:     20.19 O2 Sat:      100 % on 3 L/min Temp:     97.6 degrees F oral Pulse rate:   92 / minute Pulse rhythm:   regular Resp:     20 per minute BP sitting:   100 / 60  (right arm) Cuff size:   regular  Vitals Entered By: Glendell Docker CMA (January 12, 2010 2:15 PM)  O2 Flow:  3 L/min CC: Rm 2- Shortness of Breath, COPD follow-up   Primary Care Provider:  Dondra Spry DO  CC:  Rm 2- Shortness of Breath and COPD follow-up.  History of Present Illness:  COPD Follow-Up      This is a 70 year old woman who presents for COPD follow-up.  The patient reports shortness of breath, but denies increased sputum.  Symptoms occur daily.  The patient reports limitation of most activities.  Current treatment includes home nebulizer and continuous oxygen.    Allergies: 1)  ! Morphine  Past History:  Past Medical History: COPD...................................................................................................Marland KitchenWert      -PFTs 09/26/2006 with FEV1 33%, ratio 30% and no reversibility.  Diffusing capacity 47%     -PFT's  04/21/2009                   26%  ratio 34  no reversiblity and dlco 30%     -HFA 75% effective April 21, 2009   Hypothyroidism - subclinical glucose intolerance   Colitis    Complex Med regimen--Meds reviewed with pt education and computerized med calendar completed/adjusted. November 02, 2008  H1N1-- November 02, 2008   COLONIC POLYPS, ADENOMATOUS, HX OF (ICD-V12.72) SMALL BOWEL OBSTRUCTION, HX OF (ICD-V12.79) NEED PROPHYLACTIC VACCINATION&INOCULATION FLU (ICD-V04.81) ARTHRALGIA (ICD-719.40) DYSPNEA (ICD-786.05) PALLOR (ICD-782.61) CHEST PAIN (ICD-786.50) OSTEOPOROSIS (ICD-733.00) CHRONIC OBSTRUCTIVE PULMONARY DISEASE, ACUTE EXACERBATION (ICD-491.21) ACUTE BRONCHITIS (ICD-466.0) WEIGHT LOSS (ICD-783.21) DYSURIA  (ICD-788.1) ELEVATED BLOOD PRESSURE WITHOUT DIAGNOSIS OF HYPERTENSION (ICD-796.2) DIARRHEA (ICD-787.91) SMALL BOWEL OBSTRUCTION (ICD-560.9) COLITIS (ICD-558.9) EDEMA (ICD-782.3) WEAKNESS (ICD-780.79) GLUCOSE INTOLERANCE (ICD-271.3) LEUKOCYTOSIS (ICD-288.60) ABDOMINAL PAIN, LEFT LOWER QUADRANT (ICD-789.04) HOARSENESS (ICD-784.49) HYPOTHYROIDISM (ICD-244.9) ANXIETY (ICD-300.00) DEPRESSION (ICD-311) MIGRAINE HEADACHE (ICD-346.90) HYPERTENSION, BENIGN ESSENTIAL (ICD-401.1) GERD (ICD-530.81) COPD (ICD-496)      Family History: mother with alcoholism, heart dz, stroke, HTN, renal disease   Family History of Colon Cancer: Paternal Aunt Family History of Esophageal Cancer:  Family History of Prostate Cancer: Family History of Heart Disease: Mother   brother in [place] died from copd - age 42       Social History: Former Smoker - q 2003, prev 2 ppd smoker  Married - lives with husband  Occupation: Armed forces logistics/support/administrative officer    Occasional ETOH                Physical Exam  General:  alert and underweight appearing.   Lungs:  increased respiratory effort.    distant breath sounds,  prolonged expiration Heart:  normal rate, regular rhythm, and no gallop.     Impression & Recommendations:  Problem # 1:  COPD (ICD-496) Assessment Unchanged No increase is sputum.   I suggest using 3-4 liters of oxygen with chores at home.   arrange teaching for COPD breathing exercises.  Her updated medication list for this problem includes:    Symbicort 160-4.5 Mcg/act Aero (Budesonide-formoterol fumarate) .Marland Kitchen..Marland Kitchen Two puffs  first thing in am and again in pm    Spiriva Handihaler 18 Mcg Caps (Tiotropium bromide monohydrate) ..... Inhale contents of 1 capsule once a day    Proventil Hfa 108 (90 Base) Mcg/act Aers (Albuterol sulfate) .Marland Kitchen... 1-2 puffs every 4-6 hours as needed for wheezing    Albuterol Sulfate (2.5 Mg/15ml) 0.083% Nebu (Albuterol sulfate) ..... Inhale 1 vial via nebulizer every 4-6 hours  as needed  Orders: Misc. Referral (Misc. Ref)  Problem # 2:  DEPRESSION (ICD-311) she could not tolerate sertraline due to bad dreams.  restart remeron  The following medications were removed from the medication list:    Sertraline Hcl 25 Mg Tabs (Sertraline hcl) .Marland Kitchen... 1/2 by mouth once daily x 7 days, then 1 by mouth qd Her updated medication list for this problem includes:    Alprazolam 0.5 Mg Tabs (Alprazolam) .Marland Kitchen... Take 1 tablet by mouth four times a day    Mirtazapine 15 Mg Tabs (Mirtazapine) ..... One by mouth at bedtime  Problem # 3:  OSTEOPOROSIS (ICD-733.00) Arrange yearly reclast  Orders: Misc. Referral (Misc. Ref)  Complete Medication List: 1)  Symbicort 160-4.5 Mcg/act Aero (Budesonide-formoterol fumarate) .... Two puffs first thing in am and again in pm 2)  Spiriva Handihaler 18 Mcg Caps (Tiotropium bromide monohydrate) .... Inhale contents of 1 capsule once a day 3)  Alprazolam 0.5 Mg Tabs (Alprazolam) .... Take 1 tablet by mouth four times a day 4)  Zegerid 40-1100 Mg Caps (Omeprazole-sodium bicarbonate) .... Two times a day 5)  Calcium Carbonate-vitamin D 600-400 Mg-unit Tabs (Calcium carbonate-vitamin d) .... Take 1 tablet by mouth two times a day 6)  Vitamin D 1000 Unit Tabs (Cholecalciferol) .... Take 1 tablet by mouth once a day 7)  Align Caps (Misc intestinal flora regulat) .Marland Kitchen.. 1 once daily 8)  Oxygen  .... Use  3 liters daily 9)  Proventil Hfa 108 (90 Base) Mcg/act Aers (Albuterol sulfate) .Marland Kitchen.. 1-2 puffs every 4-6 hours as needed for wheezing 10)  Albuterol Sulfate (2.5 Mg/29ml) 0.083% Nebu (Albuterol sulfate) .... Inhale 1 vial via nebulizer every 4-6 hours as needed 11)  Mucinex Dm 30-600 Mg Tb12 (Dextromethorphan-guaifenesin) .Marland Kitchen.. 1-2 every 12 hours as needed 12)  Metamucil Plus Calcium Caps (Psyllium-calcium) .... Take 1 tablet by mouth two times a day 13)  Coenzyme Q10 100 Mg Caps (Coenzyme q10) .Marland Kitchen.. 1 once daily 14)  Xifaxan 200 Mg Tabs (Rifaximin) ....  One by mouth three times a day 15)  Mirtazapine 15 Mg Tabs (Mirtazapine) .... One by mouth at bedtime  Patient Instructions: 1)  Please schedule a follow-up appointment in 3 months. Prescriptions: MIRTAZAPINE 15 MG TABS (MIRTAZAPINE) one by mouth at bedtime  #90 x 3   Entered and Authorized by:   D. Thomos Lemons DO   Signed by:   D. Thomos Lemons DO on 01/12/2010   Method used:   Print then Give to Patient   RxID:   [id number] MIRTAZAPINE 15 MG TABS (MIRTAZAPINE) one by mouth at bedtime  #30 x 5   Entered and Authorized by:   D. Thomos Lemons DO   Signed by:   D. Thomos Lemons DO on 01/12/2010   Method used:   Electronically to        [facility]. [phone]* (retail)       [address]       [place], [place]  [address]       Ph: [phone]  Fax: [phone]   RxID:   [id number]   Current Allergies (reviewed today): ! MORPHINE

## 2010-11-30 NOTE — Progress Notes (Signed)
Summary: Follow up   Phone Note Outgoing Call   Summary of Call: call pt - received note from pulm rehab re:  progressive wt loss.  I suggest OV to eval and discuss using other meds besides remeron to help stimulate appetite Initial call taken by: D. Thomos Lemons DO,  November 17, 2010 1:49 PM  Follow-up for Phone Call        call placed to patient at [phone], no answer. A detailed voice message was left informing patient per Dr Artist Pais instructions. Message left for patient to call back to schedule follow up appointment with Dr Artist Pais Follow-up by: Glendell Docker CMA,  November 17, 2010 5:29 PM

## 2010-11-30 NOTE — Progress Notes (Signed)
Summary: Headache  Phone Note From Other Clinic   Caller: Bunnie Pion Details for Reason: HA Summary of Call: Pt wants to know what she can take when she has headaches     Nurse  has question about her COPD   order   [phone]     Initial call taken by: Darral Dash,  January 24, 2010 12:31 PM  Follow-up for Phone Call        call returned to [facility], for clarification. She states patients headache is not severe as a Migraine, however patient would like to know what she could safely take with her current medication. She also needed clarification on decompression exercises, Terrie was not sure if that was a type of pursed lip exercise Follow-up by: Glendell Docker CMA,  January 24, 2010 1:14 PM  Additional Follow-up for Phone Call Additional follow up Details #1::        ok to take tylenol Breathing exercises for COPD help you strengthen breathing muscles, get more oxygen, and breathe with less effort. Here are two examples of breathing exercises you can begin doing for five to 10 minutes, three to four times a day.  Pursed lip breathing:  1.Relax your neck and shoulder muscles. 2.Breathe in for two seconds through your nose, keeping your mouth closed. 3.Breathe out for four seconds through pursed lips. If this is too long for you, simply breathe out twice as long as you breathe in.  Use pursed-lip breathing while exercising. If you experience shortness of breath, first try slowing your rate of breathing and focus on breathing out through pursed lips.  Diaphragmatic breathing:  1.Lie on your back with knees bent. You can put a pillow under your knees for support. 2.Place one hand on your belly below your rib cage. Place the other hand on your chest. 3.Inhale deeply through your nose for a count of 3. (Your belly and lower ribs should rise, but your chest should remain still.) 4.Tighten your stomach muscles and exhale for a count of 6 through slightly puckered lips.  Additional  Follow-up by: D. Thomos Lemons DO,  January 24, 2010 1:21 PM    copy of above information mailed to patient and provided to Terri per Dr Artist Pais instructions

## 2010-11-30 NOTE — Assessment & Plan Note (Signed)
Summary: COPD/SOB/hea   Vital Signs:  Patient profile:   70 year old female Weight:      103.50 pounds BMI:     20.29 O2 Sat:      100 % on 2 L/min Temp:     98.2 degrees F oral Pulse rate:   97 / minute Pulse rhythm:   irregular Resp:     28 per minute BP sitting:   130 / 80  (right arm) Cuff size:   regular  Vitals Entered By: Glendell Docker CMA (May 29, 2010 10:40 AM)  O2 Flow:  2 L/min CC: Rm 3- Shortness of Breath, COPD follow-up Is Patient Diabetic? No Pain Assessment Patient in pain? no       Does patient need assistance? Functional Status Self care Ambulation Normal Comments c/o increase in shortness of breath with activity for the past 2 weeks,c/o throat irritation and headaches with relief from Tylenol   Primary Care Provider:  DThomos Lemons DO  CC:  Rm 3- Shortness of Breath and COPD follow-up.  History of Present Illness:  COPD Follow-Up      This is a 70 year old woman who presents for COPD follow-up.  The patient reports shortness of breath and heat intolerance, but denies increased sputum.  Symptoms occur daily.  The patient reports limitation of most activities.  Current treatment includes continuous oxygen.    she has noticed increased dyspnea with exertion x 2 weeks.  dry cough and hoarseness she denies sinus congestion    Preventive Screening-Counseling & Management  Alcohol-Tobacco     Smoking Status: quit  Allergies: 1)  ! Morphine  Past History:  Past Medical History: COPD...................................................................................................Marland KitchenWert      -PFTs 09/26/2006 with FEV1 33%, ratio 30% and no reversibility.  Diffusing capacity 47%     -PFT's  04/21/2009                   26%  ratio 34  no reversiblity and dlco 30%     -HFA 75% effective April 21, 2009   Hypothyroidism - subclinical  glucose intolerance    Colitis    H1N1-- November 02, 2008   COLONIC POLYPS, ADENOMATOUS, HX OF  (ICD-V12.72) SMALL BOWEL OBSTRUCTION, HX OF (ICD-V12.79) NEED PROPHYLACTIC VACCINATION&INOCULATION FLU (ICD-V04.81) ARTHRALGIA (ICD-719.40) DYSPNEA (ICD-786.05) PALLOR (ICD-782.61) CHEST PAIN (ICD-786.50) OSTEOPOROSIS (ICD-733.00) CHRONIC OBSTRUCTIVE PULMONARY DISEASE, ACUTE EXACERBATION (ICD-491.21) ACUTE BRONCHITIS (ICD-466.0) WEIGHT LOSS (ICD-783.21) DYSURIA (ICD-788.1) ELEVATED BLOOD PRESSURE WITHOUT DIAGNOSIS OF HYPERTENSION (ICD-796.2) DIARRHEA (ICD-787.91) SMALL BOWEL OBSTRUCTION (ICD-560.9) COLITIS (ICD-558.9) EDEMA (ICD-782.3) WEAKNESS (ICD-780.79) GLUCOSE INTOLERANCE (ICD-271.3) LEUKOCYTOSIS (ICD-288.60) ABDOMINAL PAIN, LEFT LOWER QUADRANT (ICD-789.04) HOARSENESS (ICD-784.49) HYPOTHYROIDISM (ICD-244.9) ANXIETY (ICD-300.00) DEPRESSION (ICD-311) MIGRAINE HEADACHE (ICD-346.90) HYPERTENSION, BENIGN ESSENTIAL (ICD-401.1) GERD (ICD-530.81) COPD (ICD-496)      Family History: mother with alcoholism, heart dz, stroke, HTN, renal disease   Family History of Colon Cancer: Paternal Aunt Family History of Esophageal Cancer:  Family History of Prostate Cancer: Family History of Heart Disease: Mother   brother in [place] died from copd - age 23         Physical Exam  General:  alert and underweight appearing.   Lungs:  increased resp effort,  decreased breath sounds bilaterally,  no dullness to percussion no wheezes.   Heart:  normal rate and no gallop.  heart sounds distant Extremities:  No lower extremity edema    Impression & Recommendations:  Problem # 1:  CHRONIC OBSTRUCTIVE PULMONARY DISEASE, ACUTE EXACERBATION (ICD-491.21) Assessment Deteriorated 70 y/o  with hx of stage IV copd c/o worsening dyspnea.   solumedrol 125 mg prednisone taper, and avelox maintain on prednisone 10 mg until seen by pulm refer to Dr. Vassie Loll Orders: Pulmonary Referral (Pulmonary)  Complete Medication List: 1)  Symbicort 160-4.5 Mcg/act Aero (Budesonide-formoterol fumarate) ....  Two puffs first thing in am and again in pm 2)  Spiriva Handihaler 18 Mcg Caps (Tiotropium bromide monohydrate) .... Inhale contents of 1 capsule once a day 3)  Alprazolam 0.5 Mg Tabs (Alprazolam) .... Take 1 tablet by mouth four times a day 4)  Zegerid 40-1100 Mg Caps (Omeprazole-sodium bicarbonate) .... Two times a day 5)  Calcium Carbonate-vitamin D 600-400 Mg-unit Tabs (Calcium carbonate-vitamin d) .... Take 1 tablet by mouth two times a day 6)  Vitamin D 1000 Unit Tabs (Cholecalciferol) .... Take 1 tablet by mouth once a day 7)  Align Caps (Misc intestinal flora regulat) .Marland Kitchen.. 1 once daily 8)  Oxygen  .... Use  3 liters daily 9)  Proventil Hfa 108 (90 Base) Mcg/act Aers (Albuterol sulfate) .Marland Kitchen.. 1-2 puffs every 4-6 hours as needed for wheezing 10)  Albuterol Sulfate (2.5 Mg/95ml) 0.083% Nebu (Albuterol sulfate) .... Inhale 1 vial via nebulizer every 4-6 hours as needed 11)  Mucinex Dm 30-600 Mg Tb12 (Dextromethorphan-guaifenesin) .Marland Kitchen.. 1-2 every 12 hours as needed 12)  Coenzyme Q10 100 Mg Caps (Coenzyme q10) .Marland Kitchen.. 1 once daily 13)  Xifaxan 200 Mg Tabs (Rifaximin) .... One by mouth three times a day 14)  Mirtazapine 15 Mg Tabs (Mirtazapine) .... One by mouth at bedtime 15)  Avelox 400 Mg Tabs (Moxifloxacin hcl) .... One by mouth once daily 16)  Prednisone 20 Mg Tabs (Prednisone) .... 2 tabs by mouth once daily x 4 days, 1 tab by mouth once daily x 4 days, then 1/2 tab by mouth once daily x 4 days 17)  Prednisone 10 Mg Tabs (Prednisone) .... One by mouth once daily  Patient Instructions: 1)  Call our office if your symptoms do not  improve or gets worse. Prescriptions: PREDNISONE 10 MG TABS (PREDNISONE) one by mouth once daily  #30 x 0   Entered and Authorized by:   D. Thomos Lemons DO   Signed by:   D. Thomos Lemons DO on 05/29/2010   Method used:   Print then Give to Patient   RxID:   [id number] PREDNISONE 10 MG TABS (PREDNISONE) one by mouth once daily  #30 x 0   Entered and Authorized  by:   D. Thomos Lemons DO   Signed by:   D. Thomos Lemons DO on 05/29/2010   Method used:   Electronically to        [facility]. [phone]* (retail)       [address]       [place], [place]  [address]       Ph: [phone]       Fax: [phone]   RxID:   [id number] PREDNISONE 20 MG TABS (PREDNISONE) 2 tabs by mouth once daily x 4 days, 1 tab by mouth once daily x 4 days, then 1/2 tab by mouth once daily x 4 days  #14 x 0   Entered and Authorized by:   D. Thomos Lemons DO   Signed by:   D. Thomos Lemons DO on 05/29/2010   Method used:   Electronically to        [facility]. [phone]* (retail)  [address]       [place], [place]  [address]       Ph: [phone]       Fax: [phone]   RxID:   [id number] AVELOX 400 MG TABS (MOXIFLOXACIN HCL) one by mouth once daily  #10 x 0   Entered and Authorized by:   D. Thomos Lemons DO   Signed by:   D. Thomos Lemons DO on 05/29/2010   Method used:   Electronically to        [facility]. [phone]* (retail)       [address]       [place], [place]  [address]       Ph: [phone]       Fax: [phone]   RxID:   [id number]   Current Allergies (reviewed today): ! MORPHINE  Appended Document: Orders Update    Clinical Lists Changes  Orders: Added new Service order of Solumedrol up to 125mg  (A3557) - Signed Added new Service order of Admin of Therapeutic Inj  intramuscular or subcutaneous (32202) - Signed       Medication Administration  Injection # 1:    Medication: Solumedrol up to 125mg     Diagnosis: ACUTE BRONCHITIS (ICD-466.0)    Route: IM    Site: RUOQ gluteus    Exp Date: 06/28/2012    Lot #: [id number]    Mfr: [employer]    Patient tolerated injection without complications    Given by: Glendell Docker CMA (May 29, 2010 11:43 AM)  Orders Added: 1)  Solumedrol up to 125mg  [J2930] 2)  Admin of Therapeutic Inj   intramuscular or subcutaneous [54270]

## 2010-11-30 NOTE — Miscellaneous (Signed)
Summary: [facility] Previsit/prep  Clinical Lists Changes  Medications: Added new medication of DULCOLAX 5 MG  TBEC (BISACODYL) Day before procedure take 2 at 3pm and 2 at 8pm. - Signed Added new medication of METOCLOPRAMIDE HCL 10 MG  TABS (METOCLOPRAMIDE HCL) As per prep instructions. - Signed Added new medication of MIRALAX   POWD (POLYETHYLENE GLYCOL 3350) As per prep  instructions. - Signed Rx of DULCOLAX 5 MG  TBEC (BISACODYL) Day before procedure take 2 at 3pm and 2 at 8pm.;  #4 x 0;  Signed;  Entered by: Wyona Almas RN;  Authorized by: Hart Carwin MD;  Method used: Electronically to [facility]. #[id number]*, [address], [place], [place], [place]  [address], Ph: [phone], Fax: [phone] Rx of METOCLOPRAMIDE HCL 10 MG  TABS (METOCLOPRAMIDE HCL) As per prep instructions.;  #2 x 0;  Signed;  Entered by: Wyona Almas RN;  Authorized by: Hart Carwin MD;  Method used: Electronically to [facility]. #[id number]*, [address], [place], [place], [place]  [address], Ph: [phone], Fax: [phone] Rx of MIRALAX   POWD (POLYETHYLENE GLYCOL 3350) As per prep  instructions.;  #255gm x 0;  Signed;  Entered by: Wyona Almas RN;  Authorized by: Hart Carwin MD;  Method used: Electronically to [facility]. #[id number]*, [address] [place], [place], [place]  [address], Ph: [phone], Fax: [phone] Observations: Added new observation of ALLERGY REV: Done (02/22/2010 13:07)    Prescriptions: MIRALAX   POWD (POLYETHYLENE GLYCOL 3350) As per prep  instructions.  #255gm x 0   Entered by:   Wyona Almas RN   Authorized by:   Hart Carwin MD   Signed by:   Wyona Almas RN on 02/22/2010   Method used:   Electronically to        [facility]. [phone]* (retail)       [address]       [place], [place]  [address]       Ph: [phone]       Fax: [phone]   RxID:   [id number] METOCLOPRAMIDE HCL 10 MG   TABS (METOCLOPRAMIDE HCL) As per prep instructions.  #2 x 0   Entered by:   Wyona Almas RN   Authorized by:   Hart Carwin MD   Signed by:   Wyona Almas RN on 02/22/2010   Method used:   Electronically to        [facility]. [phone]* (retail)       [address]       [place], [place]  [address]       Ph: [phone]       Fax: [phone]   RxID:   [id number] DULCOLAX 5 MG  TBEC (BISACODYL) Day before procedure take 2 at 3pm and 2 at 8pm.  #4 x 0   Entered by:   Wyona Almas RN   Authorized by:   Hart Carwin MD   Signed by:   Wyona Almas RN on 02/22/2010   Method used:   Electronically to        [facility]. [phone]* (retail)       [address]       [place], [place]  [address]       Ph:  [phone]       Fax: [phone]   RxID:   [id number]

## 2010-11-30 NOTE — Letter (Signed)
Summary: [facility] Instructions  [facility]  [address] [place], [place] [address]   Phone: [phone]  Fax: [phone]       EDITHE DOBBIN    [birth year]    MRN: [id number]       Procedure Day Dorna Bloom:  Wednesday 03/01/10     Arrival Time:  10:30 a.m.     Procedure Time: 11:30 a.m.     Location of Procedure:                    _x_  [facility] (4th Floor)    PREPARATION FOR COLONOSCOPY WITH MIRALAX  Starting 5 days prior to your procedure  02/24/10  do not eat nuts, seeds, popcorn, corn, beans, peas,  salads, or any raw vegetables.  Do not take any fiber supplements (e.g. Metamucil, Citrucel, and Benefiber). ____________________________________________________________________________________________________   THE DAY BEFORE YOUR PROCEDURE         DATE: 02/28/10  DAY:  Tuesday  1   Drink clear liquids the entire day-NO SOLID FOOD  2   Do not drink anything colored red or purple.  Avoid juices with pulp.  No orange juice.  3   Drink at least 64 oz. (8 glasses) of fluid/clear liquids during the day to prevent dehydration and help the prep work efficiently.  CLEAR LIQUIDS INCLUDE: Water Jello Ice Popsicles Tea (sugar ok, no milk/cream) Powdered fruit flavored drinks Coffee (sugar ok, no milk/cream) Gatorade Juice: apple, white grape, white cranberry  Lemonade Clear bullion, consomm, broth Carbonated beverages (any kind) Strained chicken noodle soup Hard Candy  4   Mix the entire bottle of Miralax with 64 oz. of Gatorade/Powerade in the morning and put in the refrigerator to chill.  5   At 3:00 pm take 2 Dulcolax/Bisacodyl tablets.  6   At 4:30 pm take one Reglan/Metoclopramide tablet.  7  Starting at 5:00 pm drink one 8 oz glass of the Miralax mixture every 15-20 minutes until you have finished drinking the entire 64 oz.  You should finish drinking prep around 7:30 or 8:00 pm.  8   If you are nauseated, you may take the 2nd  Reglan/Metoclopramide tablet at 6:30 pm.        9    At 8:00 pm take 2 more DULCOLAX/Bisacodyl tablets.     THE DAY OF YOUR PROCEDURE      DATE:  03/01/10   DAY:  Wednesday  You may drink clear liquids until  9:30 a.m. (2 HOURS BEFORE PROCEDURE).   MEDICATION INSTRUCTIONS  Unless otherwise instructed, you should take regular prescription medications with a small sip of water as early as possible the morning of your procedure.         OTHER INSTRUCTIONS  You will need a responsible adult at least 69 years of age to accompany you and drive you home.   This person must remain in the waiting room during your procedure.  Wear loose fitting clothing that is easily removed.  Leave jewelry and other valuables at home.  However, you may wish to bring a book to read or an iPod/MP3 player to listen to music as you wait for your procedure to start.  Remove all body piercing jewelry and leave at home.  Total time from sign-in until discharge is approximately 2-3 hours.  You should go home directly after your procedure and rest.  You can resume normal activities the day after your procedure.  The day of your procedure you  should not:   Drive   Make legal decisions   Operate machinery   Drink alcohol   Return to work  You will receive specific instructions about eating, activities and medications before you leave.   The above instructions have been reviewed and explained to me by   Wyona Almas RN  February 22, 2010 1:38 PM     I fully understand and can verbalize these instructions _____________________________ Date _______

## 2010-11-30 NOTE — Letter (Signed)
Summary: CMN for Oxygen/[facility]  CMN for Oxygen/[facility]   Imported By: Sherian Rein 05/17/2010 08:31:04  _____________________________________________________________________  External Attachment:    Type:   Image     Comment:   External Document

## 2010-11-30 NOTE — Letter (Signed)
Summary: CMN for Oxygen/[facility]  CMN for Oxygen/[facility]   Imported By: Lanelle Bal 05/18/2010 10:15:08  _____________________________________________________________________  External Attachment:    Type:   Image     Comment:   External Document

## 2010-11-30 NOTE — Progress Notes (Signed)
Summary: Urinary Pain  Phone Note Call from Patient Call back at Work Phone [phone]   Caller: Patient Call For: D. Thomos Lemons DO Summary of Call: patient called and left voice message stating she had sudden onset of urinary pain with voiding, frequency every 15 minutes, and when she voids it is judt little drops. She would like to know if she should increase her intake of cranberry juice and schedul appointment with Dr Artist Pais for Monday Initial call taken by: Glendell Docker CMA,  September 01, 2010 3:40 PM  Follow-up for Phone Call        please see if pt can drop off urine sample for UA at [facility] see rx for cipro.   If symptoms don't improve, I suggest OV on monday Follow-up by: D. Thomos Lemons DO,  September 01, 2010 3:59 PM  Additional Follow-up for Phone Call Additional follow up Details #1::        call returned to patient at [phone], she has been informed per Dr Artist Pais instructions Additional Follow-up by: Glendell Docker CMA,  September 01, 2010 4:12 PM    New/Updated Medications: CIPROFLOXACIN HCL 250 MG TABS (CIPROFLOXACIN HCL) one by mouth two times a day Prescriptions: CIPROFLOXACIN HCL 250 MG TABS (CIPROFLOXACIN HCL) one by mouth two times a day  #14 x 0   Entered and Authorized by:   D. Thomos Lemons DO   Signed by:   D. Thomos Lemons DO on 09/01/2010   Method used:   Electronically to        [facility]. [phone]* (retail)       [address]       [place], [place]  [address]       Ph: [phone]       Fax: [phone]   RxID:   [id number]

## 2010-11-30 NOTE — Letter (Signed)
Summary: LMN/[facility]  LMN/[facility]   Imported By: Lester Salem 05/31/2010 09:59:00  _____________________________________________________________________  External Attachment:    Type:   Image     Comment:   External Document

## 2010-11-30 NOTE — Progress Notes (Signed)
Summary: rehab - sob/pt waiting  Phone Note From Other Clinic Call back at [phone]   Caller: rehab- phyllis Call For: alva Summary of Call: pt is really sob predisone is not helping what should she do  Initial call taken by: Lacinda Axon,  October 03, 2010 3:09 PM  Follow-up for Phone Call        called and spoke with phyllis at rehab--she stated that the pt is c/o increased SOB over the weekend---seen by RA on 12-1 for the same and was given pred dosepak and pt stated that she feels really tight in her chest and she did have to bump up her oxygen today while at rehab to 4 liters and sats were 97%.  phyllis stated that this is just not like this pt.  she is concerned about sending her home.  please advise. 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  Additional Follow-up by: Comer Locket. Vassie Loll MD,  October 03, 2010 5:21 PM    Additional Follow-up for Phone Call Additional follow up Details #2::    pt aware to take pred 40mg  today and see tp tomorrow for ov Follow-up by: Philipp Deputy CMA,  October 03, 2010 5:40 PM

## 2010-11-30 NOTE — Assessment & Plan Note (Signed)
Summary: Acute NP office visit - DOE   Primary Provider/Referring Provider:  Dondra Spry DO  CC:  DOE and heaviness in chest and pain across the back - states no change since 09-28-10 ov.  History of Present Illness: 70 yowf  last smoked 2002 with GOLD stg IV COPD FEV1 26% 6/10 on 24 h O2 since 2009.  October 12, 2008 ov with worsening doe x 2 months to point where can only go 50 feet  with neg cardiac w/u  and subjective wheezing better with nebulizer.  no cough but quite hoarse.   breathing  no better on advair, hoarseness worse--Advair stopped-changed to Symbicort   November 02, 2008-f/u ov.  tried change to Brovana and Budesonide per neb but liked symbicort better.  May 30, 2010 9:39 AM  -  Dyspnea worse, pred taper started by Dr Artist Pais. CXR 5/11 hyperinflation  August 30, 2010 1:47 PM  Started pulm rehab october 1 st, doing well - can walk 11 laps now Has been able to stay off O2 at rest, albuterol MDI lasts 2 months   September 28, 2010 c/o increased dyspnea, hoarse, no wheeze, no cough, no edema>>prednisone burst   October 04, 2010 --Presents foran acute office visit. Complains of DOE, heaviness in chest and pain across the back. . Was seen 1 week ago w/ copd flare tx w/ prednisone 20mg  x 5 days. Breathing did not improve at all. Wheezing and more DOE. no chest pain , no swelling, no n/v. no discolored mucus. no urinary symptoms.  Feels congestion may be stuck in chest. Husband with bronchitis w/ fever. Started on abx yesterday. Denies chest pain,  orthopnea, hemoptysis, fever, n/v/d, edema, headache,recent travel.  Medications Prior to Update: 1)  Symbicort 160-4.5 Mcg/act  Aero (Budesonide-Formoterol Fumarate) .... Two Puffs First Thing in Am and Again in Pm 2)  Spiriva Handihaler 18 Mcg  Caps (Tiotropium Bromide Monohydrate) .... Inhale Contents of 1 Capsule Once A Day 3)  Alprazolam 0.5 Mg  Tabs (Alprazolam) .... Take 1 Tablet By Mouth Every Morning and Take 2 Tab By Mouth  At Bedtime 4)  Zegerid 40-1100 Mg  Caps (Omeprazole-Sodium Bicarbonate) .... Two Times A Day 5)  Calcium Carbonate-Vitamin D 600-400 Mg-Unit  Tabs (Calcium Carbonate-Vitamin D) .... Take 1 Tablet By Mouth Two Times A Day 6)  Vitamin D 1000 Unit Tabs (Cholecalciferol) .... Take 1 Tablet By Mouth Once A Day 7)  Oxygen .... Use  3 Liters Daily 8)  Proventil Hfa 108 (90 Base) Mcg/act  Aers (Albuterol Sulfate) .Marland Kitchen.. 1-2 Puffs Every 4-6 Hours As Needed For Wheezing 9)  Albuterol Sulfate (2.5 Mg/52ml) 0.083%  Nebu (Albuterol Sulfate) .... Inhale 1 Vial Via Nebulizer Every 4-6 Hours As Needed 10)  Mucinex Dm 30-600 Mg  Tb12 (Dextromethorphan-Guaifenesin) .... Take 1 Tablet By Mouth Two Times A Day 11)  Coenzyme Q10 100 Mg Caps (Coenzyme Q10) .Marland Kitchen.. 1 Once Daily 12)  Mirtazapine 15 Mg Tabs (Mirtazapine) .... One By Mouth At Bedtime 13)  Prednisone 10 Mg Tabs (Prednisone) .... 2 Tabs Once Daily X 5 Days, Then 1 Tab Once Daily  Current Medications (verified): 1)  Symbicort 160-4.5 Mcg/act  Aero (Budesonide-Formoterol Fumarate) .... Two Puffs First Thing in Am and Again in Pm 2)  Spiriva Handihaler 18 Mcg  Caps (Tiotropium Bromide Monohydrate) .... Inhale Contents of 1 Capsule Once A Day 3)  Alprazolam 0.5 Mg  Tabs (Alprazolam) .... Take 1 Tablet By Mouth Every Morning and Take 2  Tab By Mouth At Bedtime 4)  Zegerid 40-1100 Mg  Caps (Omeprazole-Sodium Bicarbonate) .... Two Times A Day 5)  Calcium Carbonate-Vitamin D 600-400 Mg-Unit  Tabs (Calcium Carbonate-Vitamin D) .... Take 1 Tablet By Mouth Two Times A Day 6)  Vitamin D 1000 Unit Tabs (Cholecalciferol) .... Take 1 Tablet By Mouth Once A Day 7)  Oxygen .... Use  3 Liters Daily 8)  Proventil Hfa 108 (90 Base) Mcg/act  Aers (Albuterol Sulfate) .Marland Kitchen.. 1-2 Puffs Every 4-6 Hours As Needed For Wheezing 9)  Albuterol Sulfate (2.5 Mg/83ml) 0.083%  Nebu (Albuterol Sulfate) .... Inhale 1 Vial Via Nebulizer Every 4-6 Hours As Needed 10)  Mucinex Dm 30-600 Mg  Tb12  (Dextromethorphan-Guaifenesin) .... Take 1 Tablet By Mouth Two Times A Day 11)  Coenzyme Q10 100 Mg Caps (Coenzyme Q10) .Marland Kitchen.. 1 Once Daily 12)  Mirtazapine 15 Mg Tabs (Mirtazapine) .... One By Mouth At Bedtime  Allergies (verified): 1)  ! Morphine  Past History:  Past Medical History: Last updated: 08/02/2010 COPD...................................................................................................Marland KitchenWert      -PFTs 09/26/2006 with FEV1 33%, ratio 30% and no reversibility.  Diffusing capacity 47%     -PFT's  04/21/2009                   26%  ratio 34  no reversiblity and dlco 30%     -HFA 75% effective April 21, 2009   Hypothyroidism - subclinical  glucose intolerance    Colitis     H1N1-- November 02, 2008   COLONIC POLYPS, ADENOMATOUS, HX OF (ICD-V12.72) SMALL BOWEL OBSTRUCTION, HX OF (ICD-V12.79) NEED PROPHYLACTIC VACCINATION&INOCULATION FLU (ICD-V04.81) ARTHRALGIA (ICD-719.40) DYSPNEA (ICD-786.05) PALLOR (ICD-782.61) CHEST PAIN (ICD-786.50) OSTEOPOROSIS (ICD-733.00) CHRONIC OBSTRUCTIVE PULMONARY DISEASE, ACUTE EXACERBATION (ICD-491.21) ACUTE BRONCHITIS (ICD-466.0) WEIGHT LOSS (ICD-783.21) DYSURIA (ICD-788.1) ELEVATED BLOOD PRESSURE WITHOUT DIAGNOSIS OF HYPERTENSION (ICD-796.2) DIARRHEA (ICD-787.91) SMALL BOWEL OBSTRUCTION (ICD-560.9) COLITIS (ICD-558.9) EDEMA (ICD-782.3) WEAKNESS (ICD-780.79) GLUCOSE INTOLERANCE (ICD-271.3) LEUKOCYTOSIS (ICD-288.60) ABDOMINAL PAIN, LEFT LOWER QUADRANT (ICD-789.04) HOARSENESS (ICD-784.49) HYPOTHYROIDISM (ICD-244.9) ANXIETY (ICD-300.00) DEPRESSION (ICD-311) MIGRAINE HEADACHE (ICD-346.90) HYPERTENSION, BENIGN ESSENTIAL (ICD-401.1)  GERD (ICD-530.81) COPD (ICD-496)      Past Surgical History: Last updated: 03/10/2010 Cholecystectomy Hysterectomy- 1993  Bladder Repair         Tubal Ligation       Family History: Last updated: 08/02/2010 mother with alcoholism, heart dz, stroke, HTN, renal disease   Family History of  Colon Cancer: Paternal Aunt Family History of Esophageal Cancer:  Family History of Prostate Cancer: Family History of Heart Disease: Mother   brother in [place] died from copd - age 25          Social History: Last updated: 08/02/2010 Former Smoker - q 2003, prev 2 ppd smoker  Married - lives with husband  Occupation: retired Armed forces logistics/support/administrative officer    Occasional ETOH                   Risk Factors: Smoking Status: quit (09/28/2010) Packs/Day: 2.0 (09/28/2010)  Review of Systems      See HPI  Vital Signs:  Patient profile:   70 year old female Height:      60 inches Weight:      103.13 pounds BMI:     20.21 O2 Sat:      95 % on 3 L/min cont Temp:     97.0 degrees F oral Pulse rate:   95 / minute BP sitting:   138 / 82  (left arm) Cuff size:   regular  Vitals Entered By:  Boone Master CNA/MA (October 04, 2010 10:45 AM)  O2 Flow:  3 L/min cont CC: DOE, heaviness in chest and pain across the back - states no change since 09-28-10 ov Is Patient Diabetic? No Comments Medications reviewed with patient Daytime contact number verified with patient. Boone Master CNA/MA  October 04, 2010 10:46 AM    Physical Exam  Additional Exam:  wt 107 August 30, 2010 >103 October 04, 2010 Gen. Pleasant, petite, in no distress, normal affect, on 3L O2 ENT - no lesions, no post nasal drip Neck: No JVD, no thyromegaly, no carotid bruits Lungs decreased bs in bases , faint exp wheeze  Cardiovascular: Rhythm regular, heart sounds  normal, no murmurs or gallops, no peripheral edema Musculoskeletal: No deformities, no cyanosis or clubbing      Impression & Recommendations:  Problem # 1:  CHRONIC OBSTRUCTIVE PULMONARY DISEASE, ACUTE EXACERBATION (ICD-491.21) Slow to resolve exacerbaton  Depo medrol 80mg  IM  Plan:  Levaquin 500mg  once daily for 7 days Mucinex DM two times a day as needed cough/congestion  Prednisone taper over next week.  Increase fluids. and rest.  follow  up 1 week. and as needed  Please contact office for sooner follow up if symptoms do not improve or worsen   Medications Added to Medication List This Visit: 1)  Levaquin 500 Mg Tabs (Levofloxacin) .Marland Kitchen.. 1 by mouth once daily 2)  Prednisone 10 Mg Tabs (Prednisone) .... 4 tabs for 3 days, then 3 tabs for 3 days, 2 tabs for 3 days, then 1 tab for 3  days, then stop  Complete Medication List: 1)  Symbicort 160-4.5 Mcg/act Aero (Budesonide-formoterol fumarate) .... Two puffs first thing in am and again in pm 2)  Spiriva Handihaler 18 Mcg Caps (Tiotropium bromide monohydrate) .... Inhale contents of 1 capsule once a day 3)  Alprazolam 0.5 Mg Tabs (Alprazolam) .... Take 1 tablet by mouth every morning and take 2 tab by mouth at bedtime 4)  Zegerid 40-1100 Mg Caps (Omeprazole-sodium bicarbonate) .... Two times a day 5)  Calcium Carbonate-vitamin D 600-400 Mg-unit Tabs (Calcium carbonate-vitamin d) .... Take 1 tablet by mouth two times a day 6)  Vitamin D 1000 Unit Tabs (Cholecalciferol) .... Take 1 tablet by mouth once a day 7)  Oxygen  .... Use  3 liters daily 8)  Proventil Hfa 108 (90 Base) Mcg/act Aers (Albuterol sulfate) .Marland Kitchen.. 1-2 puffs every 4-6 hours as needed for wheezing 9)  Albuterol Sulfate (2.5 Mg/72ml) 0.083% Nebu (Albuterol sulfate) .... Inhale 1 vial via nebulizer every 4-6 hours as needed 10)  Mucinex Dm 30-600 Mg Tb12 (Dextromethorphan-guaifenesin) .... Take 1 tablet by mouth two times a day 11)  Coenzyme Q10 100 Mg Caps (Coenzyme q10) .Marland Kitchen.. 1 once daily 12)  Mirtazapine 15 Mg Tabs (Mirtazapine) .... One by mouth at bedtime 13)  Levaquin 500 Mg Tabs (Levofloxacin) .Marland Kitchen.. 1 by mouth once daily 14)  Prednisone 10 Mg Tabs (Prednisone) .... 4 tabs for 3 days, then 3 tabs for 3 days, 2 tabs for 3 days, then 1 tab for 3  days, then stop  Other Orders: Admin of Therapeutic Inj  intramuscular or subcutaneous (04540) Depo- Medrol 80mg  (J1040) Est. Patient Level IV (98119)  Patient  Instructions: 1)  Levaquin 500mg  once daily for 7 days 2)  Mucinex DM two times a day as needed cough/congestion  3)  Prednisone taper over next week.  4)  Increase fluids. and rest.  5)  follow up 1 week. and  as needed  6)  Please contact office for sooner follow up if symptoms do not improve or worsen  Prescriptions: PREDNISONE 10 MG TABS (PREDNISONE) 4 tabs for 3 days, then 3 tabs for 3 days, 2 tabs for 3 days, then 1 tab for 3  days, then stop  #30 x 0   Entered and Authorized by:   Rubye Oaks NP   Signed by:   Rubye Oaks NP on 10/04/2010   Method used:   Electronically to        [facility]. [phone]* (retail)       [address]       [place], [place]  [address]       Ph: [phone]       Fax: [phone]   RxID:   [id number] LEVAQUIN 500 MG TABS (LEVOFLOXACIN) 1 by mouth once daily  #7 x 0   Entered and Authorized by:   Rubye Oaks NP   Signed by:   Rubye Oaks NP on 10/04/2010   Method used:   Electronically to        [facility]. [phone]* (retail)       [address]       [place], [place]  [address]       Ph: [phone]       Fax: [phone]   RxID:   [id number]    Medication Administration  Injection # 1:    Medication: Depo- Medrol 80mg     Diagnosis: CHRONIC OBSTRUCTIVE PULMONARY DISEASE, ACUTE EXACERBATION (ICD-491.21)    Route: IM    Site: LUOQ gluteus    Exp Date: 02-2013    Lot #: [id number]    Mfr: [employer]    Patient tolerated injection without complications    Given by: Boone Master CNA/MA (October 04, 2010 11:17 AM)  Orders Added: 1)  Admin of Therapeutic Inj  intramuscular or subcutaneous [96372] 2)  Depo- Medrol 80mg  [J1040] 3)  Est. Patient Level IV [22025]

## 2010-11-30 NOTE — Miscellaneous (Signed)
Summary: bently prescription  Clinical Lists Changes  Medications: Added new medication of BENTYL 20 MG  TABS (DICYCLOMINE HCL) take one every morning - Signed Rx of BENTYL 20 MG  TABS (DICYCLOMINE HCL) take one every morning;  #30 x 1;  Signed;  Entered by: Laverna Peace RN;  Authorized by: Hart Carwin MD;  Method used: Electronically to [facility]. #[id number]*, [address], [place], [place], [place]  [address], Ph: [phone], Fax: [phone]    Prescriptions: BENTYL 20 MG  TABS (DICYCLOMINE HCL) take one every morning  #30 x 1   Entered by:   Laverna Peace RN   Authorized by:   Hart Carwin MD   Signed by:   Laverna Peace RN on 03/01/2010   Method used:   Electronically to        [facility]. [phone]* (retail)       [address]       [place], [place]  [address]       Ph: [phone]       Fax: [phone]   RxID:   [id number]

## 2010-11-30 NOTE — Letter (Signed)
Summary: CMN/[facility]  CMN/[facility]   Imported By: Lester [place] 05/25/2010 08:11:14  _____________________________________________________________________  External Attachment:    Type:   Image     Comment:   External Document

## 2010-11-30 NOTE — Assessment & Plan Note (Signed)
Summary: cpx/mhf   Vital Signs:  Patient profile:   70 year old female Weight:      104.50 pounds BMI:     20.48 O2 Sat:      100 % on 3 L/min Temp:     97.6 degrees F oral Pulse rate:   70 / minute Pulse rhythm:   regular Resp:     22 per minute BP sitting:   130 / 80  (right arm) Cuff size:   regular  Vitals Entered By: Glendell Docker CMA (December 29, 2009 11:05 AM)  O2 Flow:  3 L/min CC: RM 3- CPX Comments CPX, patient states she has had an increase in shortness of breath and chest tightness.     Last PAP Result Declined-Hyst   Primary Care Provider:  Dondra Spry DO  CC:  RM 3- CPX.  History of Present Illness:  70 y/o white female with severe COPD for routine CPX medical and social hx reviewed.  her husband having more health issues.   he suffered severe pna and now is oxygen dependent - 69/2010 pt is thin.  severe COPD limits her activities  Osteoporosis - she experienced muscle aches after receiving prev ReClast infusion but symptoms were transient  COPD - more cough and dyspnea recently  Depression - not using remeron regularly due to wt gain.  she does have depressive symptoms.  sometimes she doesn't live her house for 2 wks due to severe COPD.  hard to carry her oxygen  Allergies: 1)  ! Morphine  Past History:  Past Medical History: COPD...................................................................................................Marland KitchenWert      -PFTs 09/26/2006 with FEV1 33%, ratio 30% and no reversibility.  Diffusing capacity 47%     -PFT's  04/21/2009                   26%  ratio 34  no reversiblity and dlco 30%     -HFA 75% effective April 21, 2009   Hypothyroidism - subclinical glucose intolerance  Colitis    Complex Med regimen--Meds reviewed with pt education and computerized med calendar completed/adjusted. November 02, 2008  H1N1-- November 02, 2008   COLONIC POLYPS, ADENOMATOUS, HX OF (ICD-V12.72) SMALL BOWEL OBSTRUCTION, HX OF  (ICD-V12.79) NEED PROPHYLACTIC VACCINATION&INOCULATION FLU (ICD-V04.81) ARTHRALGIA (ICD-719.40) DYSPNEA (ICD-786.05) PALLOR (ICD-782.61) CHEST PAIN (ICD-786.50) OSTEOPOROSIS (ICD-733.00) CHRONIC OBSTRUCTIVE PULMONARY DISEASE, ACUTE EXACERBATION (ICD-491.21) ACUTE BRONCHITIS (ICD-466.0) WEIGHT LOSS (ICD-783.21) DYSURIA (ICD-788.1) ELEVATED BLOOD PRESSURE WITHOUT DIAGNOSIS OF HYPERTENSION (ICD-796.2) DIARRHEA (ICD-787.91) SMALL BOWEL OBSTRUCTION (ICD-560.9) COLITIS (ICD-558.9) EDEMA (ICD-782.3) WEAKNESS (ICD-780.79) GLUCOSE INTOLERANCE (ICD-271.3) LEUKOCYTOSIS (ICD-288.60) ABDOMINAL PAIN, LEFT LOWER QUADRANT (ICD-789.04) HOARSENESS (ICD-784.49) HYPOTHYROIDISM (ICD-244.9) ANXIETY (ICD-300.00) DEPRESSION (ICD-311) MIGRAINE HEADACHE (ICD-346.90) HYPERTENSION, BENIGN ESSENTIAL (ICD-401.1) GERD (ICD-530.81) COPD (ICD-496)      Past Surgical History: Cholecystectomy Hysterectomy- 1993  Bladder Repair         Tubal Ligation      Family History: mother with alcoholism, heart dz, stroke, HTN, renal disease   Family History of Colon Cancer: Paternal Aunt Family History of Esophageal Cancer:  Family History of Prostate Cancer: Family History of Heart Disease: Mother   brother in [place] died from copd - age 43     Social History: Former Smoker - q 2003, prev 2 ppd smoker  Married - lives with husband  Occupation: Armed forces logistics/support/administrative officer    Occasional ETOH              Physical Exam  General:  alert and underweight appearing.   Neck:  No deformities, masses, or tenderness noted. Lungs:  increased respiratory effort.  decreased breath sounds left lung field,  prolonged expiration Heart:  normal rate, regular rhythm, and no gallop.   Abdomen:  soft, non-tender, normal bowel sounds, no masses, no hepatomegaly, and no splenomegaly.   Extremities:  No lower extremity edema  Neurologic:  cranial nerves II-XII intact and gait normal.   Psych:  normally interactive and good  eye contact.     Impression & Recommendations:  Problem # 1:  HEALTH MAINTENANCE EXAM (ICD-V70.0) Reviewed adult health maintenance protocols.  I recommended antidepressant.    Mammogram: ASSESSMENT: Negative - BI-RADS 1^MM DIGITAL SCREENING (08/12/2009) Pap smear: Declined-Hyst (12/29/2009) Colonoscopy: Done (12/26/2007) Bone Density: abnormal (06/30/2008) Td Booster: given (12/14/2002)   Flu Vax: Fluvax MCR (08/18/2009)   Pneumovax: Pneumovax (11/30/2003) TSH: 1.68 (07/26/2008)     Orders: EKG w/ Interpretation (93000)  Problem # 2:  OSTEOPOROSIS (ICD-733.00)  Pt referred for ReClast infusion due to intolerance of oral bisphosphonates (esophagitis) . Initial infusion 06/2008.   She started having significant joint pains in her elbows and hands.   She felt achy all over.   More fluid retention and dyspnea which she attributes  to ReClast infusion.  Orders: TLB-BMP (Basic Metabolic Panel-BMET) (80048-METABOL)  Problem # 3:  COPD (ICD-496) Pt having COPD exacerbation.  tx with avelox and prednisone.  cxr reviewed  Her updated medication list for this problem includes:    Symbicort 160-4.5 Mcg/act Aero (Budesonide-formoterol fumarate) .Marland Kitchen..Marland Kitchen Two puffs first thing in am and again in pm    Spiriva Handihaler 18 Mcg Caps (Tiotropium bromide monohydrate) ..... Inhale contents of 1 capsule once a day    Proventil Hfa 108 (90 Base) Mcg/act Aers (Albuterol sulfate) .Marland Kitchen... 1-2 puffs every 4-6 hours as needed for wheezing    Albuterol Sulfate (2.5 Mg/70ml) 0.083% Nebu (Albuterol sulfate) ..... Inhale 1 vial via nebulizer every 4-6 hours as needed  Orders: T-2 View CXR, Same Day (71020.5TC) TLB-CBC Platelet - w/Differential (85025-CBCD)  Problem # 4:  DEPRESSION (ICD-311) Assessment: Deteriorated She is not using mirtazapin due to concerns of wt gain.  depressive symptoms assoc with severe limitations from COPD.  start sertraline.  The following medications were removed from the medication  list:    Mirtazapine 15 Mg Tabs (Mirtazapine) .Marland Kitchen... Take 1 tab by mouth at bedtime Her updated medication list for this problem includes:    Alprazolam 0.5 Mg Tabs (Alprazolam) .Marland Kitchen... Take 1 tablet by mouth four times a day    Sertraline Hcl 25 Mg Tabs (Sertraline hcl) .Marland Kitchen... 1/2 by mouth once daily x 7 days, then 1 by mouth qd  Complete Medication List: 1)  Symbicort 160-4.5 Mcg/act Aero (Budesonide-formoterol fumarate) .... Two puffs first thing in am and again in pm 2)  Spiriva Handihaler 18 Mcg Caps (Tiotropium bromide monohydrate) .... Inhale contents of 1 capsule once a day 3)  Alprazolam 0.5 Mg Tabs (Alprazolam) .... Take 1 tablet by mouth four times a day 4)  Zegerid 40-1100 Mg Caps (Omeprazole-sodium bicarbonate) .... Two times a day 5)  Calcium Carbonate-vitamin D 600-400 Mg-unit Tabs (Calcium carbonate-vitamin d) .... Take 1 tablet by mouth two times a day 6)  Vitamin D 1000 Unit Tabs (Cholecalciferol) .... Take 1 tablet by mouth once a day 7)  Align Caps (Misc intestinal flora regulat) .Marland Kitchen.. 1 once daily 8)  Oxygen  .... Use  3 liters daily 9)  Proventil Hfa 108 (90 Base) Mcg/act Aers (Albuterol sulfate) .Marland Kitchen.. 1-2 puffs every 4-6 hours  as needed for wheezing 10)  Albuterol Sulfate (2.5 Mg/25ml) 0.083% Nebu (Albuterol sulfate) .... Inhale 1 vial via nebulizer every 4-6 hours as needed 11)  Mucinex Dm 30-600 Mg Tb12 (Dextromethorphan-guaifenesin) .Marland Kitchen.. 1-2 every 12 hours as needed 12)  Metamucil Plus Calcium Caps (Psyllium-calcium) .... Take 1 tablet by mouth two times a day 13)  Coenzyme Q10 100 Mg Caps (Coenzyme q10) .Marland Kitchen.. 1 once daily 14)  Xifaxan 200 Mg Tabs (Rifaximin) .... One by mouth three times a day 15)  Sertraline Hcl 25 Mg Tabs (Sertraline hcl) .... 1/2 by mouth once daily x 7 days, then 1 by mouth qd 16)  Avelox 400 Mg Tabs (Moxifloxacin hcl) .... One by mouth once daily 17)  Prednisone 10 Mg Tabs (Prednisone) .... 3 tabs by mouth once daily x 3 days, 2 tabs by mouth once daily  x 3 days, 1 tab by mouth once daily x 3 days  Patient Instructions: 1)  Call within 2-4 weeks re:  response to sertraline 2)  Please schedule a follow-up appointment in 2 months. Prescriptions: PREDNISONE 10 MG TABS (PREDNISONE) 3 tabs by mouth once daily x 3 days, 2 tabs by mouth once daily x 3 days, 1 tab by mouth once daily x 3 days  #18 x 0   Entered and Authorized by:   D. Thomos Lemons DO   Signed by:   D. Thomos Lemons DO on 12/29/2009   Method used:   Electronically to        [facility]. [phone]* (retail)       [address]       [place], [place]  [address]       Ph: [phone]       Fax: [phone]   RxID:   [id number] AVELOX 400 MG TABS (MOXIFLOXACIN HCL) one by mouth once daily  #8 x 0   Entered and Authorized by:   D. Thomos Lemons DO   Signed by:   D. Thomos Lemons DO on 12/29/2009   Method used:   Samples Given   RxID:   [id number] SERTRALINE HCL 25 MG TABS (SERTRALINE HCL) 1/2 by mouth once daily x 7 days, then 1 by mouth qd  #30 x 0   Entered and Authorized by:   D. Thomos Lemons DO   Signed by:   D. Thomos Lemons DO on 12/29/2009   Method used:   Electronically to        [facility]. [phone]* (retail)       [address]       [place], [place]  [address]       Ph: [phone]       Fax: [phone]   RxID:   [id number]   Current Allergies (reviewed today): ! MORPHINE     Preventive Care Screening  Pap Smear:    Date:  12/29/2009    Results:  Declined-Hyst  Bone Density:    Date:  06/30/2008    Results:  abnormal std dev

## 2010-11-30 NOTE — Assessment & Plan Note (Signed)
Summary: copd/ mbw pt okay with h/p   Visit Type:  Initial Consult Primary Provider/Referring Provider:  Dondra Spry DO  CC:  Pt here for pulmonary consult.  History of Present Illness: 89 yowf  last smoked 2002 with GOLD stg IV COPD FEV1 26% 6/10 on 24 h O2 since 2009.  October 12, 2008 ov with worsening doe x 2 months to point where can only go 50 feet  with neg cardiac w/u  and subjective wheezing better with nebulizer.  no cough but quite hoarse.   breathing  no better on advair, hoarseness worse--Advair stopped-changed to Symbicort   November 02, 2008-f/u ov.  tried change to Brovana and Budesonide, the equivalent of Symbicort, per neb but liked symbicort better.  May 30, 2010 9:39 AM  -  Dyspnea worse, pred taper started by Dr Artist Pais. Wonders what else can be done to improve her breathing. Using MDI 2-3 /d, not needing nebs CXR 5/11 hyperinflation  Pt denies any significant sore throat, dyphagia, itching, sneezing,  nasal congestion or excess secretions or cough,  fever, chills, sweats, unintended wt loss, pleuritic or exertional cp, change in activity tolerance  orthopnea pnd or leg swelling.     Preventive Screening-Counseling & Management  Alcohol-Tobacco     Alcohol drinks/day: <1     Alcohol type: wine     Smoking Status: quit     Packs/Day: 2.0     Year Started: 1963     Year Quit: 2001  Current Medications (verified): 1)  Symbicort 160-4.5 Mcg/act  Aero (Budesonide-Formoterol Fumarate) .... Two Puffs First Thing in Am and Again in Pm 2)  Spiriva Handihaler 18 Mcg  Caps (Tiotropium Bromide Monohydrate) .... Inhale Contents of 1 Capsule Once A Day 3)  Alprazolam 0.5 Mg  Tabs (Alprazolam) .... Take 1 Tablet By Mouth Four Times A Day 4)  Zegerid 40-1100 Mg  Caps (Omeprazole-Sodium Bicarbonate) .... Two Times A Day 5)  Calcium Carbonate-Vitamin D 600-400 Mg-Unit  Tabs (Calcium Carbonate-Vitamin D) .... Take 1 Tablet By Mouth Two Times A Day 6)  Vitamin D 1000 Unit  Tabs (Cholecalciferol) .... Take 1 Tablet By Mouth Once A Day 7)  Align  Caps (Misc Intestinal Flora Regulat) .Marland Kitchen.. 1 Once Daily 8)  Oxygen .... Use  3 Liters Daily 9)  Proventil Hfa 108 (90 Base) Mcg/act  Aers (Albuterol Sulfate) .Marland Kitchen.. 1-2 Puffs Every 4-6 Hours As Needed For Wheezing 10)  Albuterol Sulfate (2.5 Mg/43ml) 0.083%  Nebu (Albuterol Sulfate) .... Inhale 1 Vial Via Nebulizer Every 4-6 Hours As Needed 11)  Mucinex Dm 30-600 Mg  Tb12 (Dextromethorphan-Guaifenesin) .Marland Kitchen.. 1-2 Every 12 Hours As Needed 12)  Coenzyme Q10 100 Mg Caps (Coenzyme Q10) .Marland Kitchen.. 1 Once Daily 13)  Xifaxan 200 Mg Tabs (Rifaximin) .... One By Mouth Three Times A Day 14)  Mirtazapine 15 Mg Tabs (Mirtazapine) .... One By Mouth At Bedtime 15)  Avelox 400 Mg Tabs (Moxifloxacin Hcl) .... One By Mouth Once Daily 16)  Prednisone 20 Mg Tabs (Prednisone) .... 2 Tabs By Mouth Once Daily X 4 Days, 1 Tab By Mouth Once Daily X 4 Days, Then 1/2 Tab By Mouth Once Daily X 4 Days 17)  Prednisone 10 Mg Tabs (Prednisone) .... One By Mouth Once Daily  Allergies (verified): 1)  ! Morphine  Past History:  Past Medical History: Last updated: 05/29/2010 COPD...................................................................................................Marland KitchenWert      -PFTs 09/26/2006 with FEV1 33%, ratio 30% and no reversibility.  Diffusing capacity 47%     -  PFT's  04/21/2009                   26%  ratio 34  no reversiblity and dlco 30%     -HFA 75% effective April 21, 2009   Hypothyroidism - subclinical  glucose intolerance    Colitis    H1N1-- November 02, 2008   COLONIC POLYPS, ADENOMATOUS, HX OF (ICD-V12.72) SMALL BOWEL OBSTRUCTION, HX OF (ICD-V12.79) NEED PROPHYLACTIC VACCINATION&INOCULATION FLU (ICD-V04.81) ARTHRALGIA (ICD-719.40) DYSPNEA (ICD-786.05) PALLOR (ICD-782.61) CHEST PAIN (ICD-786.50) OSTEOPOROSIS (ICD-733.00) CHRONIC OBSTRUCTIVE PULMONARY DISEASE, ACUTE EXACERBATION (ICD-491.21) ACUTE BRONCHITIS (ICD-466.0) WEIGHT LOSS  (ICD-783.21) DYSURIA (ICD-788.1) ELEVATED BLOOD PRESSURE WITHOUT DIAGNOSIS OF HYPERTENSION (ICD-796.2) DIARRHEA (ICD-787.91) SMALL BOWEL OBSTRUCTION (ICD-560.9) COLITIS (ICD-558.9) EDEMA (ICD-782.3) WEAKNESS (ICD-780.79) GLUCOSE INTOLERANCE (ICD-271.3) LEUKOCYTOSIS (ICD-288.60) ABDOMINAL PAIN, LEFT LOWER QUADRANT (ICD-789.04) HOARSENESS (ICD-784.49) HYPOTHYROIDISM (ICD-244.9) ANXIETY (ICD-300.00) DEPRESSION (ICD-311) MIGRAINE HEADACHE (ICD-346.90) HYPERTENSION, BENIGN ESSENTIAL (ICD-401.1) GERD (ICD-530.81) COPD (ICD-496)      Social History: Last updated: 05/30/2010 Former Smoker - q 2003, prev 2 ppd smoker  Married - lives with husband  Occupation: retired Armed forces logistics/support/administrative officer    Occasional ETOH                  Social History: Former Smoker - q 2003, prev 2 ppd smoker  Married - lives with husband  Occupation: retired Armed forces logistics/support/administrative officer    Occasional ETOH                Packs/Day:  2.0 Alcohol drinks/day:  <1  Review of Systems       The patient complains of shortness of breath with activity, non-productive cough, acid heartburn, indigestion, difficulty swallowing, sore throat, tooth/dental problems, headaches, sneezing, ear ache, anxiety, and depression.  The patient denies shortness of breath at rest, productive cough, coughing up blood, chest pain, irregular heartbeats, loss of appetite, weight change, abdominal pain, nasal congestion/difficulty breathing through nose, itching, hand/feet swelling, joint stiffness or pain, rash, change in color of mucus, and fever.    Vital Signs:  Patient profile:   70 year old female Height:      60 inches Weight:      103 pounds BMI:     20.19 O2 Sat:      94 % on 3 L/min Temp:     97.9 degrees F oral Pulse rate:   110 / minute BP sitting:   110 / 70  (left arm) Cuff size:   regular  Vitals Entered By: Zackery Barefoot CMA (May 30, 2010 9:03 AM)  O2 Flow:  3 L/min CC: Pt here for pulmonary  consult Comments Medications reviewed with patient Verified contact number and pharmacy with patient Zackery Barefoot CMA  May 30, 2010 9:03 AM    Physical Exam  Additional Exam:  Gen. Pleasant, petite, in no distress, normal affect, on 3L O2 ENT - no lesions, no post nasal drip Neck: No JVD, no thyromegaly, no carotid bruits Lungs: no use of accessory muscles, no dullness to percussion, decreased  without rales or rhonchi  Cardiovascular: Rhythm regular, heart sounds  normal, no murmurs or gallops, no peripheral edema Abdomen: soft and non-tender, no hepatosplenomegaly, BS normal. Musculoskeletal: No deformities, no cyanosis or clubbing Neuro:  alert, non focal     Impression & Recommendations:  Problem # 1:  COPD (ICD-496) Worsening exerciswe tolerance Short course of prednisone ct spiriva, symbicort & O2 Pulm rehab Dental office surgery planned - high risk for GA.  Problem # 2:  THRUSH (ICD-112.0) Nystatin S &  S Hoarseness ongoing since 3 yrs. ENT exam for completion?  Medications Added to Medication List This Visit: 1)  Nystatin 100000 Unit/ml Susp (Nystatin) .... 5 ml swish & swallow once daily  Other Orders: Est. Patient Level IV ([address]) Prescription Created Electronically [id number]) Rehabilitation Referral (Rehab)  Patient Instructions: 1)  Copy sent to: Dr Artist Pais 2)  Please schedule a follow-up appointment in 3 months. 3)  We have recommended that you participate in Pulmonary Rehabilitation Program.  You will need your exercise prescription and copy of your evaluation to be considered for participation. 4)  Complete course of prednisone 5)  Rinse mouth after using symbicort Prescriptions: NYSTATIN 100000 UNIT/ML SUSP (NYSTATIN) 5 ml swish & swallow once daily  #75 x 1   Entered and Authorized by:   Comer Locket Vassie Loll MD   Signed by:   Comer Locket Vassie Loll MD on 05/30/2010   Method used:   Electronically to        [facility]. [phone]* (retail)       [address]       [place], [place]  [address]       Ph: [phone]       Fax: [phone]   RxID:   [id number]

## 2010-12-01 ENCOUNTER — Other Ambulatory Visit: Payer: Self-pay

## 2010-12-01 ENCOUNTER — Encounter: Payer: Self-pay | Admitting: Internal Medicine

## 2010-12-01 ENCOUNTER — Other Ambulatory Visit: Payer: Self-pay | Admitting: Internal Medicine

## 2010-12-01 ENCOUNTER — Encounter (INDEPENDENT_AMBULATORY_CARE_PROVIDER_SITE_OTHER): Payer: Self-pay | Admitting: *Deleted

## 2010-12-01 DIAGNOSIS — R634 Abnormal weight loss: Secondary | ICD-10-CM

## 2010-12-01 LAB — BASIC METABOLIC PANEL
CO2: 34 mEq/L — ABNORMAL HIGH (ref 19–32)
Calcium: 9.1 mg/dL (ref 8.4–10.5)
Chloride: 103 mEq/L (ref 96–112)
Creatinine, Ser: 0.5 mg/dL (ref 0.4–1.2)
Glucose, Bld: 109 mg/dL — ABNORMAL HIGH (ref 70–99)

## 2010-12-01 LAB — CBC WITH DIFFERENTIAL/PLATELET
Basophils Absolute: 0.1 10*3/uL (ref 0.0–0.1)
Eosinophils Absolute: 0.1 10*3/uL (ref 0.0–0.7)
Hemoglobin: 12.3 g/dL (ref 12.0–15.0)
Lymphocytes Relative: 22.8 % (ref 12.0–46.0)
MCHC: 33.8 g/dL (ref 30.0–36.0)
MCV: 89.3 fl (ref 78.0–100.0)
Monocytes Absolute: 0.6 10*3/uL (ref 0.1–1.0)
Neutro Abs: 5.1 10*3/uL (ref 1.4–7.7)
Neutrophils Relative %: 66.5 % (ref 43.0–77.0)
RDW: 14.9 % — ABNORMAL HIGH (ref 11.5–14.6)

## 2010-12-05 ENCOUNTER — Encounter (HOSPITAL_COMMUNITY): Payer: Medicare FFS | Attending: Internal Medicine

## 2010-12-06 NOTE — Letter (Signed)
    at [facility] [address] [place], [place]  [address]  [place] Phone: [phone]      December 01, 2010   Morris Kitchings [address] [place], [place] [address]  RE:  LAB RESULTS  Dear  Ms. Drost,  The following is an interpretation of your most recent lab tests.  Please take note of any instructions provided or changes to medications that have resulted from your lab work.  ELECTROLYTES:  Good - no changes needed  KIDNEY FUNCTION TESTS:  Good - no changes needed  THYROID STUDIES:  Thyroid studies normal TSH: 1.45     CBC:  Stable - no changes needed       Sincerely Yours,    Dr. Thomos Lemons  Appended Document:  mailed

## 2010-12-06 NOTE — Miscellaneous (Signed)
Summary: [facility]  [facility]   Imported By: Sherian Rein 11/28/2010 10:22:54  _____________________________________________________________________  External Attachment:    Type:   Image     Comment:   External Document

## 2010-12-07 ENCOUNTER — Encounter (HOSPITAL_COMMUNITY): Payer: Medicare FFS

## 2010-12-12 ENCOUNTER — Encounter (HOSPITAL_COMMUNITY): Payer: Medicare FFS

## 2010-12-12 ENCOUNTER — Emergency Department (HOSPITAL_COMMUNITY): Payer: Medicare FFS

## 2010-12-12 ENCOUNTER — Telehealth (INDEPENDENT_AMBULATORY_CARE_PROVIDER_SITE_OTHER): Payer: Self-pay | Admitting: *Deleted

## 2010-12-12 ENCOUNTER — Emergency Department (HOSPITAL_COMMUNITY)
Admission: EM | Admit: 2010-12-12 | Discharge: 2010-12-12 | Disposition: A | Discharge status: Home / Self Care | Payer: Medicare FFS | Attending: Emergency Medicine | Admitting: Emergency Medicine

## 2010-12-12 DIAGNOSIS — R0602 Shortness of breath: Secondary | ICD-10-CM | POA: Insufficient documentation

## 2010-12-12 DIAGNOSIS — J4489 Other specified chronic obstructive pulmonary disease: Secondary | ICD-10-CM | POA: Insufficient documentation

## 2010-12-12 DIAGNOSIS — Z9981 Dependence on supplemental oxygen: Secondary | ICD-10-CM | POA: Insufficient documentation

## 2010-12-12 DIAGNOSIS — J449 Chronic obstructive pulmonary disease, unspecified: Secondary | ICD-10-CM | POA: Insufficient documentation

## 2010-12-14 ENCOUNTER — Encounter: Payer: Self-pay | Admitting: Adult Health

## 2010-12-14 ENCOUNTER — Ambulatory Visit (INDEPENDENT_AMBULATORY_CARE_PROVIDER_SITE_OTHER): Payer: Medicare FFS | Admitting: Adult Health

## 2010-12-14 ENCOUNTER — Encounter (HOSPITAL_COMMUNITY): Payer: Medicare FFS

## 2010-12-14 DIAGNOSIS — J449 Chronic obstructive pulmonary disease, unspecified: Secondary | ICD-10-CM

## 2010-12-18 ENCOUNTER — Encounter: Payer: Self-pay | Admitting: Pulmonary Disease

## 2010-12-19 ENCOUNTER — Encounter (HOSPITAL_COMMUNITY): Payer: Medicare FFS

## 2010-12-20 NOTE — Assessment & Plan Note (Addendum)
Summary: Acute NP office visit - COPD   Primary Provider/Referring Provider:  Dondra Spry DO  CC:  increased SOB, wheezing x5days - went to ED 2.14.12 and was given prednisone 60mg .  states feels no better.  denies cough, chest congestion, f/c/s, and Abdominal Pain.  History of Present Illness: 70 yowf  last smoked 2002 with GOLD stg IV COPD FEV1 26% 6/10 on 24 h O2 since 2009.  October 12, 2008 ov with worsening doe x 2 months to point where can only go 50 feet  with neg cardiac w/u  and subjective wheezing better with nebulizer.  no cough but quite hoarse.   breathing  no better on advair, hoarseness worse--Advair stopped-changed to Symbicort   November 02, 2008-f/u ov.  tried change to Brovana and Budesonide per neb but liked symbicort better.  May 30, 2010 9:39 AM  -  Dyspnea worse, pred taper started by Dr Artist Pais. CXR 5/11 hyperinflation  August 30, 2010 1:47 PM  Started pulm rehab october 1 st, doing well - can walk 11 laps now Has been able to stay off O2 at rest, albuterol MDI lasts 2 months   September 28, 2010 c/o increased dyspnea, hoarse, no wheeze, no cough, no edema>>prednisone burst   October 04, 2010 --Presents foran acute office visit. Complains of DOE, heaviness in chest and pain across the back. . Was seen 1 week ago w/ copd flare tx w/ prednisone 20mg  x 5 days. Breathing did not improve at all. Wheezing and more DOE. no chest pain , no swelling, no n/v. no discolored mucus. no urinary symptoms.  Feels congestion may be stuck in chest. Husband with bronchitis w/ fever. Started on abx yesterday.>>Levaquin and steroid taper  December 14, 2010 --Presents for an acute office visit.  Complains of increased SOB, wheezing x5days. She was seen in ER on  2.14.12 and was given prednisone 60mg  x 1 dose without much improvement.  Complains of dry cough,no discolored mucus. Wheezing worse for last few days.  Esp in early am and late night. Woke up 2/14 with worsening wheezing,  unable to get ov in clinic so she went to ER. She is no better and does not want to get any worse. Denies chest pain,  orthopnea, hemoptysis, fever, n/v/d, edema, headache.  Dyspepsia History:      There is a prior history of GERD.    Medications Prior to Update: 1)  Symbicort 160-4.5 Mcg/act  Aero (Budesonide-Formoterol Fumarate) .... Two Puffs First Thing in Am and Again in Pm 2)  Spiriva Handihaler 18 Mcg  Caps (Tiotropium Bromide Monohydrate) .... Inhale Contents of 1 Capsule Once A Day 3)  Alprazolam 0.5 Mg  Tabs (Alprazolam) .... Take 1 Tablet By Mouth Every Morning and Take 2 Tab By Mouth At Bedtime 4)  Zegerid 40-1100 Mg  Caps (Omeprazole-Sodium Bicarbonate) .... Two Times A Day 5)  Calcium Carbonate-Vitamin D 600-400 Mg-Unit  Tabs (Calcium Carbonate-Vitamin D) .... Take 1 Tablet By Mouth Two Times A Day 6)  Vitamin D 1000 Unit Tabs (Cholecalciferol) .... Take 1 Tablet By Mouth Once A Day 7)  Oxygen .... Use  3 Liters Daily 8)  Proventil Hfa 108 (90 Base) Mcg/act  Aers (Albuterol Sulfate) .Marland Kitchen.. 1-2 Puffs Every 4-6 Hours As Needed For Wheezing 9)  Albuterol Sulfate (2.5 Mg/73ml) 0.083%  Nebu (Albuterol Sulfate) .... Inhale 1 Vial Via Nebulizer Every 4-6 Hours As Needed 10)  Mucinex 600 Mg Xr12h-Tab (Guaifenesin) .... One By Mouth Two  Times A Day As Needed 11)  Coenzyme Q10 100 Mg Caps (Coenzyme Q10) .Marland Kitchen.. 1 Once Daily 12)  Mirtazapine 15 Mg Tabs (Mirtazapine) .... One By Mouth At Bedtime 13)  Megace Es 625 Mg/3ml Susp (Megestrol Acetate) .... 1/2 Tsp Once Daily  Current Medications (verified): 1)  Symbicort 160-4.5 Mcg/act  Aero (Budesonide-Formoterol Fumarate) .... Two Puffs First Thing in Am and Again in Pm 2)  Spiriva Handihaler 18 Mcg  Caps (Tiotropium Bromide Monohydrate) .... Inhale Contents of 1 Capsule Once A Day 3)  Alprazolam 0.5 Mg  Tabs (Alprazolam) .... Take 1 Tablet By Mouth Every Morning and Take 2 Tab By Mouth At Bedtime 4)  Zegerid 40-1100 Mg  Caps (Omeprazole-Sodium  Bicarbonate) .... Two Times A Day 5)  Calcium Carbonate-Vitamin D 600-400 Mg-Unit  Tabs (Calcium Carbonate-Vitamin D) .... Take 1 Tablet By Mouth Two Times A Day 6)  Vitamin D 1000 Unit Tabs (Cholecalciferol) .... Take 1 Tablet By Mouth Once A Day 7)  Oxygen .... Use  3 Liters Daily 8)  Proventil Hfa 108 (90 Base) Mcg/act  Aers (Albuterol Sulfate) .Marland Kitchen.. 1-2 Puffs Every 4-6 Hours As Needed For Wheezing 9)  Albuterol Sulfate (2.5 Mg/24ml) 0.083%  Nebu (Albuterol Sulfate) .... Inhale 1 Vial Via Nebulizer Every 4-6 Hours As Needed 10)  Mucinex 600 Mg Xr12h-Tab (Guaifenesin) .... One By Mouth Two Times A Day As Needed 11)  Coenzyme Q10 100 Mg Caps (Coenzyme Q10) .Marland Kitchen.. 1 Once Daily 12)  Mirtazapine 15 Mg Tabs (Mirtazapine) .... One By Mouth At Bedtime 13)  Megace Es 625 Mg/57ml Susp (Megestrol Acetate) .... 1/2 Tsp Once Daily  Allergies (verified): 1)  ! Morphine  Past History:  Past Medical History: Last updated: 11/27/2010 COPD...................................................................................................Marland KitchenWert      -PFTs 09/26/2006 with FEV1 33%, ratio 30% and no reversibility.  Diffusing capacity 47%     -PFT's  04/21/2009                   26%  ratio 34  no reversiblity and dlco 30%     -HFA 75% effective April 21, 2009   Hypothyroidism - subclinical   glucose intolerance    Colitis     H1N1-- November 02, 2008   COLONIC POLYPS, ADENOMATOUS, HX OF (ICD-V12.72) SMALL BOWEL OBSTRUCTION, HX OF (ICD-V12.79) NEED PROPHYLACTIC VACCINATION&INOCULATION FLU (ICD-V04.81) ARTHRALGIA (ICD-719.40) DYSPNEA (ICD-786.05) PALLOR (ICD-782.61) CHEST PAIN (ICD-786.50) OSTEOPOROSIS (ICD-733.00) CHRONIC OBSTRUCTIVE PULMONARY DISEASE, ACUTE EXACERBATION (ICD-491.21) ACUTE BRONCHITIS (ICD-466.0) WEIGHT LOSS (ICD-783.21) DYSURIA (ICD-788.1) ELEVATED BLOOD PRESSURE WITHOUT DIAGNOSIS OF HYPERTENSION (ICD-796.2) DIARRHEA (ICD-787.91) SMALL BOWEL OBSTRUCTION (ICD-560.9) COLITIS  (ICD-558.9) EDEMA (ICD-782.3) WEAKNESS (ICD-780.79) GLUCOSE INTOLERANCE (ICD-271.3) LEUKOCYTOSIS (ICD-288.60) ABDOMINAL PAIN, LEFT LOWER QUADRANT (ICD-789.04) HOARSENESS (ICD-784.49) HYPOTHYROIDISM (ICD-244.9) ANXIETY (ICD-300.00) DEPRESSION (ICD-311) MIGRAINE HEADACHE (ICD-346.90) HYPERTENSION, BENIGN ESSENTIAL (ICD-401.1)  GERD (ICD-530.81) COPD (ICD-496)      Past Surgical History: Last updated: 03/10/2010 Cholecystectomy Hysterectomy- 1993  Bladder Repair         Tubal Ligation       Family History: Last updated: 08/02/2010 mother with alcoholism, heart dz, stroke, HTN, renal disease   Family History of Colon Cancer: Paternal Aunt Family History of Esophageal Cancer:  Family History of Prostate Cancer: Family History of Heart Disease: Mother   brother in [place] died from copd - age 58          Social History: Last updated: 08/02/2010 Former Smoker - q 2003, prev 2 ppd smoker  Married - lives with husband  Occupation: retired [employer] logistics/support/administrative officer    Occasional  ETOH                   Risk Factors: Smoking Status: quit (11/27/2010) Packs/Day: 2.0 (09/28/2010)  Review of Systems      See HPI  Vital Signs:  Patient profile:   70 year old female Height:      60 inches Weight:      100.38 pounds BMI:     19.67 O2 Sat:      94 % on 3 L/min PULSING Temp:     97.9 degrees F oral Pulse rate:   106 / minute BP sitting:   130 / 84  (left arm) Cuff size:   regular  Vitals Entered By: Boone Master CNA/MA (December 14, 2010 3:07 PM)  O2 Flow:  3 L/min PULSING CC: increased SOB, wheezing x5days - went to ED 2.14.12 and was given prednisone 60mg .  states feels no better.  denies cough, chest congestion, f/c/s, Abdominal Pain Is Patient Diabetic? No Comments Medications reviewed with patient Daytime contact number verified with patient. Boone Master CNA/MA  December 14, 2010 3:07 PM    Physical Exam  Ears:  TMs intact and clear with normal  canals Additional Exam:  wt 107 August 30, 2010 >103 October 04, 2010> 100 December 14, 2010 Gen. Pleasant, petite, in no distress, normal affect, on 3L O2 ENT - no lesions, no post nasal drip Neck: No JVD, no thyromegaly, no carotid bruits Lungs decreased bs in bases , faint exp wheeze  Cardiovascular: Rhythm regular, heart sounds  normal, no murmurs or gallops, no peripheral edema Musculoskeletal: No deformities, no cyanosis or clubbing      Impression & Recommendations:  Problem # 1:  CHRONIC OBSTRUCTIVE PULMONARY DISEASE, ACUTE EXACERBATION (ICD-491.21) Prednisone taper over next week.  Mucinex DM two times a day as needed cough/congestion Omncief 300mg  two times a day to have on hold if symptoms worsen with discolored mucus.  follow up as planned and as needed  consider Daliresp on return (recurrent flares)  Medications Added to Medication List This Visit: 1)  Cefdinir 300 Mg Caps (Cefdinir) .Marland Kitchen.. 1 by mouth two times a day 2)  Prednisone 10 Mg Tabs (Prednisone) .... 4 tabs for 2 days, then 3 tabs for 2 days, 2 tabs for 2 days, then 1 tab for 2 days, then stop  Other Orders: Est. Patient Level III ([address]109)   Patient Instructions: 1)  Prednisone taper over next week.  2)  Mucinex DM two times a day as needed cough/congestion 3)  Omncief 300mg  two times a day to have on hold if symptoms worsen with discolored mucus.  4)  follow up as planned and as needed  Prescriptions: PREDNISONE 10 MG TABS (PREDNISONE) 4 tabs for 2 days, then 3 tabs for 2 days, 2 tabs for 2 days, then 1 tab for 2 days, then stop  #20 x 0   Entered and Authorized by:   Rubye Oaks NP   Signed by:   Rubye Oaks NP on 12/14/2010   Method used:   Electronically to        [facility]. [phone]* (retail)       [address]       [place], [place]  [address]       Ph: [phone]       Fax: [phone]   RxID:   [id number] CEFDINIR 300 MG CAPS (CEFDINIR) 1 by mouth  two times a  day  #14 x 0   Entered and Authorized by:   Rubye Oaks NP   Signed by:   Derel Mcglasson NP on 12/14/2010   Method used:   Print then Mail to Patient   RxID:   [id number]

## 2010-12-20 NOTE — Assessment & Plan Note (Signed)
Summary: discuss weight loss/ss   Vital Signs:  Patient profile:   70 year old female Height:      60 inches Weight:      98.25 pounds BMI:     19.26 O2 Sat:      97 % on 3 L/min Temp:     98.2 degrees F oral Pulse rate:   99 / minute Resp:     28 per minute BP sitting:   110 / 80  (right arm) Cuff size:   regular  Vitals Entered By: Glendell Docker CMA (November 27, 2010 3:38 PM)  O2 Flow:  3 L/min CC: follow-up visit to discuss wt loss Is Patient Diabetic? No Pain Assessment Patient in pain? no      Comments advised to follow up after pumonary rehab due to wt loss, request written [facility]   Primary Care Provider:  D. Thomos Lemons DO  CC:  follow-up visit to discuss wt loss.  History of Present Illness: 70 y/o female with severe copd for f/u pt has been participating in pulm rehab some additional wt loss despite using remeron eating well  Preventive Screening-Counseling & Management  Alcohol-Tobacco     Smoking Status: quit  Allergies: 1)  ! Morphine  Past History:  Past Medical History: COPD...................................................................................................Marland KitchenWert      -PFTs 09/26/2006 with FEV1 33%, ratio 30% and no reversibility.  Diffusing capacity 47%     -PFT's  04/21/2009                   26%  ratio 34  no reversiblity and dlco 30%     -HFA 75% effective April 21, 2009   Hypothyroidism - subclinical   glucose intolerance    Colitis     H1N1-- November 02, 2008   COLONIC POLYPS, ADENOMATOUS, HX OF (ICD-V12.72) SMALL BOWEL OBSTRUCTION, HX OF (ICD-V12.79) NEED PROPHYLACTIC VACCINATION&INOCULATION FLU (ICD-V04.81) ARTHRALGIA (ICD-719.40) DYSPNEA (ICD-786.05) PALLOR (ICD-782.61) CHEST PAIN (ICD-786.50) OSTEOPOROSIS (ICD-733.00) CHRONIC OBSTRUCTIVE PULMONARY DISEASE, ACUTE EXACERBATION (ICD-491.21) ACUTE BRONCHITIS (ICD-466.0) WEIGHT LOSS (ICD-783.21) DYSURIA (ICD-788.1) ELEVATED BLOOD  PRESSURE WITHOUT DIAGNOSIS OF HYPERTENSION (ICD-796.2) DIARRHEA (ICD-787.91) SMALL BOWEL OBSTRUCTION (ICD-560.9) COLITIS (ICD-558.9) EDEMA (ICD-782.3) WEAKNESS (ICD-780.79) GLUCOSE INTOLERANCE (ICD-271.3) LEUKOCYTOSIS (ICD-288.60) ABDOMINAL PAIN, LEFT LOWER QUADRANT (ICD-789.04) HOARSENESS (ICD-784.49) HYPOTHYROIDISM (ICD-244.9) ANXIETY (ICD-300.00) DEPRESSION (ICD-311) MIGRAINE HEADACHE (ICD-346.90) HYPERTENSION, BENIGN ESSENTIAL (ICD-401.1)  GERD (ICD-530.81) COPD (ICD-496)      Review of Systems       nasal congestion  Physical Exam  General:  alert, well-developed, and well-nourished.   Lungs:  normal respiratory effort and distand breath sounds.  prolonged expiration  Heart:  normal rate, regular rhythm, and no gallop.   Abdomen:  soft, non-tender, and normal bowel sounds.   Neurologic:  cranial nerves II-XII intact and gait normal.     Impression & Recommendations:  Problem # 1:  WEIGHT LOSS (ICD-783.21)  trial of adding megace  Complete Medication List: 1)  Symbicort 160-4.5 Mcg/act Aero (Budesonide-formoterol fumarate) .... Two puffs first thing in am and again in pm 2)  Spiriva Handihaler 18 Mcg Caps (Tiotropium bromide monohydrate) .... Inhale contents of 1 capsule once a day 3)  Alprazolam 0.5 Mg Tabs (Alprazolam) .... Take 1 tablet by mouth every morning and take 2 tab by mouth at bedtime 4)  Zegerid 40-1100 Mg Caps (Omeprazole-sodium bicarbonate) .... Two times a day 5)  Calcium Carbonate-vitamin D 600-400 Mg-unit Tabs (Calcium carbonate-vitamin d) .... Take 1 tablet by mouth two times a  day 6)  Vitamin D 1000 Unit Tabs (Cholecalciferol) .... Take 1 tablet by mouth once a day 7)  Oxygen  .... Use  3 liters daily 8)  Proventil Hfa 108 (90 Base) Mcg/act Aers (Albuterol sulfate) .Marland Kitchen.. 1-2 puffs every 4-6 hours as needed for wheezing 9)  Albuterol Sulfate (2.5 Mg/58ml) 0.083% Nebu (Albuterol sulfate) .... Inhale 1 vial via nebulizer every 4-6 hours as needed 10)   Mucinex 600 Mg Xr12h-tab (Guaifenesin) .... One by mouth two times a day as needed 11)  Coenzyme Q10 100 Mg Caps (Coenzyme q10) .Marland Kitchen.. 1 once daily 12)  Mirtazapine 15 Mg Tabs (Mirtazapine) .... One by mouth at bedtime 13)  Megace Es 625 Mg/67ml Susp (Megestrol acetate) .... 1/2 tsp once daily  Patient Instructions: 1)  Please schedule a follow-up appointment in 2 months. 2)  Use salt water nose spray daily Prescriptions: MUCINEX 600 MG XR12H-TAB (GUAIFENESIN) one by mouth two times a day as needed  #180 x 1   Entered and Authorized by:   D. Thomos Lemons DO   Signed by:   D. Thomos Lemons DO on 11/27/2010   Method used:   Print then Give to Patient   RxID:   [id number] MEGACE ES 625 MG/5ML SUSP (MEGESTROL ACETATE) 1/2 tsp once daily  #1 month x 3   Entered and Authorized by:   D. Thomos Lemons DO   Signed by:   D. Thomos Lemons DO on 11/27/2010   Method used:   Electronically to        [facility]. [phone]* (retail)       [address]       [place], [place]  [address]       Ph: [phone]       Fax: [phone]   RxID:   [id number]    Orders Added: 1)  Est. Patient Level III [67672]    Current Allergies (reviewed today): ! MORPHINE

## 2010-12-20 NOTE — Progress Notes (Signed)
Summary: FYI  Phone Note Call from Patient Call back at Work Phone [phone]   Caller: Patient Call For: alva Summary of Call: FYI: Pt states she having exacerbation of her copd and she's on her way to [facility] er. Initial call taken by: Darletta Moll,  December 12, 2010 8:21 AM  Follow-up for Phone Call        Spoke with pt.  She states was having wheezing and increased SOB this am.  Micah Flesher to ER at [facility] and was given prednisone and sent home.  States feeling some better was was told needed followup soon.  Sched her to see TP for 2/16 at 3 pm. Follow-up by: Vernie Murders,  December 12, 2010 4:59 PM

## 2010-12-21 ENCOUNTER — Encounter (HOSPITAL_COMMUNITY): Payer: Medicare FFS

## 2010-12-26 ENCOUNTER — Encounter (HOSPITAL_COMMUNITY): Payer: Medicare FFS

## 2010-12-28 ENCOUNTER — Encounter (HOSPITAL_COMMUNITY): Payer: Self-pay

## 2011-01-02 ENCOUNTER — Encounter (HOSPITAL_COMMUNITY): Payer: Self-pay

## 2011-01-02 ENCOUNTER — Ambulatory Visit: Payer: Self-pay | Admitting: Adult Health

## 2011-01-04 ENCOUNTER — Encounter (HOSPITAL_COMMUNITY): Payer: Self-pay

## 2011-01-04 NOTE — Procedures (Signed)
Summary: 6MWT/Pulmonary/[place]  6MWT/Pulmonary/[place]   Imported By: Lester [place] 12/28/2010 10:04:40  _____________________________________________________________________  External Attachment:    Type:   Image     Comment:   External Document

## 2011-01-04 NOTE — Miscellaneous (Signed)
Summary: Progress notes/Pulmonary/[place]  Progress notes/Pulmonary/   Imported By: Lester [place] 12/28/2010 10:07:57  _____________________________________________________________________  External Attachment:    Type:   Image     Comment:   External Document

## 2011-01-09 ENCOUNTER — Ambulatory Visit (INDEPENDENT_AMBULATORY_CARE_PROVIDER_SITE_OTHER): Payer: Medicare FFS | Admitting: Adult Health

## 2011-01-09 ENCOUNTER — Encounter (HOSPITAL_COMMUNITY): Payer: Self-pay

## 2011-01-09 ENCOUNTER — Encounter: Payer: Self-pay | Admitting: Adult Health

## 2011-01-09 DIAGNOSIS — J441 Chronic obstructive pulmonary disease with (acute) exacerbation: Secondary | ICD-10-CM

## 2011-01-11 ENCOUNTER — Encounter (HOSPITAL_COMMUNITY): Payer: Self-pay

## 2011-01-16 ENCOUNTER — Encounter (HOSPITAL_COMMUNITY): Payer: Self-pay

## 2011-01-16 NOTE — Assessment & Plan Note (Signed)
Summary: NP follow up for COPD flare   Primary Provider/Referring Provider:  Dondra Spry DO  CC:  follow up .  History of Present Illness: 70 yowf  last smoked 2002 with GOLD stg IV COPD FEV1 26% 6/10 on 24 h O2 since 2009.  October 12, 2008 ov with worsening doe x 2 months to point where can only go 50 feet  with neg cardiac w/u  and subjective wheezing better with nebulizer.  no cough but quite hoarse.   breathing  no better on advair, hoarseness worse--Advair stopped-changed to Symbicort   November 02, 2008-f/u ov.  tried change to Brovana and Budesonide per neb but liked symbicort better.  May 30, 2010 9:39 AM  -  Dyspnea worse, pred taper started by Dr Artist Pais. CXR 5/11 hyperinflation  August 30, 2010 1:47 PM  Started pulm rehab october 1 st, doing well - can walk 11 laps now Has been able to stay off O2 at rest, albuterol MDI lasts 2 months   September 28, 2010 c/o increased dyspnea, hoarse, no wheeze, no cough, no edema>>prednisone burst   October 04, 2010 --Presents foran acute office visit. Complains of DOE, heaviness in chest and pain across the back. . Was seen 1 week ago w/ copd flare tx w/ prednisone 20mg  x 5 days. Breathing did not improve at all. Wheezing and more DOE. no chest pain , no swelling, no n/v. no discolored mucus. no urinary symptoms.  Feels congestion may be stuck in chest. Husband with bronchitis w/ fever. Started on abx yesterday.>>Levaquin and steroid taper  December 14, 2010 --Presents for an acute office visit.  Complains of increased SOB, wheezing x5days. She was seen in ER on  2.14.12 and was given prednisone 60mg  x 1 dose without much improvement.  Complains of dry cough,no discolored mucus. Wheezing worse for last few days.  Esp in early am and late night. Woke up 2/14 with worsening wheezing, unable to get ov in clinic so she went to ER. She is no better and does not want to get any worse.>>tx w/ steroid taper   January 09, 2011 --Presents for a  follow up.  Had COPD flare last ov. Tx w/ steroid taper and omnicef. She is feeling better, off steroids but stiill has DOE and wears out easily. No cough or wheezing. Has low energy. Worse at night. Uses her neb rarely. No discolored mucus. Denies chest pain,  orthopnea, hemoptysis, fever, n/v/d, edema, headache.   Medications Prior to Update: 1)  Symbicort 160-4.5 Mcg/act  Aero (Budesonide-Formoterol Fumarate) .... Two Puffs First Thing in Am and Again in Pm 2)  Spiriva Handihaler 18 Mcg  Caps (Tiotropium Bromide Monohydrate) .... Inhale Contents of 1 Capsule Once A Day 3)  Alprazolam 0.5 Mg  Tabs (Alprazolam) .... Take 1 Tablet By Mouth Every Morning and Take 2 Tab By Mouth At Bedtime 4)  Zegerid 40-1100 Mg  Caps (Omeprazole-Sodium Bicarbonate) .... Two Times A Day 5)  Calcium Carbonate-Vitamin D 600-400 Mg-Unit  Tabs (Calcium Carbonate-Vitamin D) .... Take 1 Tablet By Mouth Two Times A Day 6)  Vitamin D 1000 Unit Tabs (Cholecalciferol) .... Take 1 Tablet By Mouth Once A Day 7)  Proventil Hfa 108 (90 Base) Mcg/act  Aers (Albuterol Sulfate) .Marland Kitchen.. 1-2 Puffs Every 4-6 Hours As Needed For Wheezing 8)  Albuterol Sulfate (2.5 Mg/66ml) 0.083%  Nebu (Albuterol Sulfate) .... Inhale 1 Vial Via Nebulizer Every 4-6 Hours As Needed 9)  Mucinex 600 Mg Xr12h-Tab (  Guaifenesin) .... One By Mouth Two Times A Day As Needed 10)  Coenzyme Q10 100 Mg Caps (Coenzyme Q10) .Marland Kitchen.. 1 Once Daily 11)  Mirtazapine 15 Mg Tabs (Mirtazapine) .... One By Mouth At Bedtime 12)  Megace Es 625 Mg/21ml Susp (Megestrol Acetate) .... 1/2 Tsp Once Daily 13)  Oxygen .... Use  3 Liters Daily 14)  Cefdinir 300 Mg Caps (Cefdinir) .Marland Kitchen.. 1 By Mouth Two Times A Day 15)  Prednisone 10 Mg Tabs (Prednisone) .... 4 Tabs For 2 Days, Then 3 Tabs For 2 Days, 2 Tabs For 2 Days, Then 1 Tab For 2 Days, Then Stop  Current Medications (verified): 1)  Symbicort 160-4.5 Mcg/act  Aero (Budesonide-Formoterol Fumarate) .... Two Puffs First Thing in Am and Again in  Pm 2)  Spiriva Handihaler 18 Mcg  Caps (Tiotropium Bromide Monohydrate) .... Inhale Contents of 1 Capsule Once A Day 3)  Alprazolam 0.5 Mg  Tabs (Alprazolam) .... Take 1 Tablet By Mouth Every Morning and Take 2 Tab By Mouth At Bedtime 4)  Zegerid 40-1100 Mg  Caps (Omeprazole-Sodium Bicarbonate) .... Two Times A Day 5)  Calcium Carbonate-Vitamin D 600-400 Mg-Unit  Tabs (Calcium Carbonate-Vitamin D) .... Take 1 Tablet By Mouth Two Times A Day 6)  Vitamin D 1000 Unit Tabs (Cholecalciferol) .... Take 1 Tablet By Mouth Once A Day 7)  Oxygen .... Use  3 Liters Daily 8)  Proventil Hfa 108 (90 Base) Mcg/act  Aers (Albuterol Sulfate) .Marland Kitchen.. 1-2 Puffs Every 4-6 Hours As Needed For Wheezing 9)  Albuterol Sulfate (2.5 Mg/4ml) 0.083%  Nebu (Albuterol Sulfate) .... Inhale 1 Vial Via Nebulizer Every 4-6 Hours As Needed 10)  Mucinex 600 Mg Xr12h-Tab (Guaifenesin) .... One By Mouth Two Times A Day As Needed 11)  Coenzyme Q10 100 Mg Caps (Coenzyme Q10) .Marland Kitchen.. 1 Once Daily 12)  Mirtazapine 15 Mg Tabs (Mirtazapine) .... One By Mouth At Bedtime 13)  Megace Es 625 Mg/61ml Susp (Megestrol Acetate) .... 1/2 Tsp Once Daily  Allergies (verified): 1)  ! Morphine  Past History:  Past Medical History: Last updated: 11/27/2010 COPD...................................................................................................Marland KitchenWert      -PFTs 09/26/2006 with FEV1 33%, ratio 30% and no reversibility.  Diffusing capacity 47%     -PFT's  04/21/2009                   26%  ratio 34  no reversiblity and dlco 30%     -HFA 75% effective April 21, 2009   Hypothyroidism - subclinical   glucose intolerance    Colitis     H1N1-- November 02, 2008   COLONIC POLYPS, ADENOMATOUS, HX OF (ICD-V12.72) SMALL BOWEL OBSTRUCTION, HX OF (ICD-V12.79) NEED PROPHYLACTIC VACCINATION&INOCULATION FLU (ICD-V04.81) ARTHRALGIA (ICD-719.40) DYSPNEA (ICD-786.05) PALLOR (ICD-782.61) CHEST PAIN (ICD-786.50) OSTEOPOROSIS (ICD-733.00) CHRONIC  OBSTRUCTIVE PULMONARY DISEASE, ACUTE EXACERBATION (ICD-491.21) ACUTE BRONCHITIS (ICD-466.0) WEIGHT LOSS (ICD-783.21) DYSURIA (ICD-788.1) ELEVATED BLOOD PRESSURE WITHOUT DIAGNOSIS OF HYPERTENSION (ICD-796.2) DIARRHEA (ICD-787.91) SMALL BOWEL OBSTRUCTION (ICD-560.9) COLITIS (ICD-558.9) EDEMA (ICD-782.3) WEAKNESS (ICD-780.79) GLUCOSE INTOLERANCE (ICD-271.3) LEUKOCYTOSIS (ICD-288.60) ABDOMINAL PAIN, LEFT LOWER QUADRANT (ICD-789.04) HOARSENESS (ICD-784.49) HYPOTHYROIDISM (ICD-244.9) ANXIETY (ICD-300.00) DEPRESSION (ICD-311) MIGRAINE HEADACHE (ICD-346.90) HYPERTENSION, BENIGN ESSENTIAL (ICD-401.1)  GERD (ICD-530.81) COPD (ICD-496)      Past Surgical History: Last updated: 03/10/2010 Cholecystectomy Hysterectomy- 1993  Bladder Repair         Tubal Ligation       Family History: Last updated: 08/02/2010 mother with alcoholism, heart dz, stroke, HTN, renal disease   Family History of Colon Cancer: Paternal Aunt Family History of Esophageal  Cancer:  Family History of Prostate Cancer: Family History of Heart Disease: Mother   brother in [place] died from copd - age 61          Social History: Last updated: 08/02/2010 Former Smoker - q 2003, prev 2 ppd smoker  Married - lives with husband  Occupation: retired Armed forces logistics/support/administrative officer    Occasional ETOH                   Risk Factors: Smoking Status: quit (11/27/2010) Packs/Day: 2.0 (09/28/2010)  Review of Systems      See HPI  Vital Signs:  Patient profile:   70 year old female Height:      60 inches Weight:      97 pounds BMI:     19.01 O2 Sat:      100 % on 3 L/min Temp:     98.2 degrees F oral Pulse rate:   103 / minute BP sitting:   120 / 78  (left arm) Cuff size:   regular  Vitals Entered By: Zackery Barefoot CMA (January 09, 2011 10:27 AM)  O2 Flow:  3 L/min CC: follow up  Is Patient Diabetic? No Comments Medications reviewed with patient Verified contact number and pharmacy with patient Zackery Barefoot [facility]  January 09, 2011 10:28 AM    Physical Exam  Additional Exam:  wt 107 August 30, 2010 >103 October 04, 2010> 100 December 14, 2010>>97 January 09, 2011   Gen. Pleasant, petite, in no distress, normal affect, on 3L O2 ENT - no lesions, no post nasal drip Neck: No JVD, no thyromegaly, no carotid bruits Lungs decreased bs in bases , no wheezing  Cardiovascular: Rhythm regular, heart sounds  normal, no murmurs or gallops, no peripheral edema Musculoskeletal: No deformities, no cyanosis or clubbing      Impression & Recommendations:  Problem # 1:  CHRONIC OBSTRUCTIVE PULMONARY DISEASE, ACUTE EXACERBATION (ICD-491.21)  Resolving.  cont on current regimen.  use albuterol neb as needed  advance act as tolerated.   Orders: Est. Patient Level III (16109)  Patient Instructions: 1)  Continue on Spiriva and Symbicort  2)  May use Albuterol neb every 4-6 hr as needed wheezing/shortness of breath.  3)  follow up Dr. Vassie Loll as planned next month 4)  Please contact office for sooner follow up if symptoms do not improve or worsen  Prescriptions: SPIRIVA HANDIHALER 18 MCG  CAPS (TIOTROPIUM BROMIDE MONOHYDRATE) Inhale contents of 1 capsule once a day  #3 month x 3   Entered and Authorized by:   Rubye Oaks NP   Signed by:   Xaria Judon NP on 01/09/2011   Method used:   Print then Give to Patient   RxID:   [id number]

## 2011-01-18 ENCOUNTER — Encounter (HOSPITAL_COMMUNITY): Payer: Self-pay

## 2011-01-22 ENCOUNTER — Telehealth: Payer: Self-pay | Admitting: *Deleted

## 2011-01-22 NOTE — Telephone Encounter (Signed)
Patient called and left voice message stating she was exposed to chickenpox  by her grandchild. She would like to know if she should get the shingles vaccine.  She is scheduled for follow up with Dr Artist Pais on Friday  Call was returned to patient at [phone], no answer. A voice message was left for patient to check with her insurance company to see if they cover the shingles vaccine, and if so she was informed she could have the vaccine at her office visit on Friday

## 2011-01-23 ENCOUNTER — Encounter (HOSPITAL_COMMUNITY): Payer: Self-pay

## 2011-01-25 ENCOUNTER — Encounter (HOSPITAL_COMMUNITY): Payer: Self-pay

## 2011-01-26 ENCOUNTER — Encounter: Payer: Self-pay | Admitting: Internal Medicine

## 2011-01-26 ENCOUNTER — Ambulatory Visit (INDEPENDENT_AMBULATORY_CARE_PROVIDER_SITE_OTHER): Payer: Medicare FFS | Admitting: Internal Medicine

## 2011-01-26 ENCOUNTER — Telehealth: Payer: Self-pay | Admitting: Internal Medicine

## 2011-01-26 VITALS — BP 110/80 | HR 95 | Temp 98.2°F | Resp 22 | Ht 60.0 in | Wt 97.0 lb

## 2011-01-26 DIAGNOSIS — Z23 Encounter for immunization: Secondary | ICD-10-CM

## 2011-01-26 DIAGNOSIS — Z2911 Encounter for prophylactic immunotherapy for respiratory syncytial virus (RSV): Secondary | ICD-10-CM

## 2011-01-26 DIAGNOSIS — M25519 Pain in unspecified shoulder: Secondary | ICD-10-CM

## 2011-01-26 DIAGNOSIS — M25511 Pain in right shoulder: Secondary | ICD-10-CM

## 2011-01-26 MED ORDER — CYCLOBENZAPRINE HCL 5 MG PO TABS: ORAL_TABLET | ORAL | Status: DC

## 2011-01-26 MED ORDER — DICLOFENAC SODIUM 1 % TD GEL: 1.0000 "application " | Freq: Three times a day (TID) | TRANSDERMAL | Status: DC

## 2011-01-26 MED ORDER — ALBUTEROL SULFATE HFA 108 (90 BASE) MCG/ACT IN AERS: 2.0000 | INHALATION_SPRAY | Freq: Four times a day (QID) | RESPIRATORY_TRACT | Status: DC | PRN

## 2011-01-26 NOTE — Telephone Encounter (Signed)
[facility] informed rx changed to Pensaid 20 drops to shoulder three times per day

## 2011-01-26 NOTE — Patient Instructions (Signed)
Use warm heating pad to right shoulder / neck. Use mattress pad.  Please call our office if your symptoms do not improve or gets worse.

## 2011-01-26 NOTE — Telephone Encounter (Signed)
VOLTAREN IS STILL ON BACK ORDER.   DR YOO SAID OK TO SEND RX FOR PENNSAID TO THE [facility] [place]

## 2011-01-26 NOTE — Progress Notes (Signed)
Addended by: Glendell Docker on: 01/26/2011 03:59 PM   Modules accepted: Orders

## 2011-01-26 NOTE — Assessment & Plan Note (Signed)
I suspect pain from strain of carrying oxygen tank. Question mild shoulder bursitis Use cyclobenzaprine - low dose and voltaren gel  Ask oxygen co to see if she can use dolly to transport oxygen

## 2011-01-26 NOTE — Progress Notes (Signed)
  Subjective:    Patient ID: [name], female    DOB: 07-14-1941, 70 y.o.   MRN: [id number]  Shoulder Pain  The pain is present in the right shoulder and neck. This is a new problem. The current episode started in the past 7 days. There has been no history of extremity trauma. The problem occurs daily. The problem has been gradually worsening. The quality of the pain is described as aching. The pain is moderate. Pertinent negatives include no fever. The symptoms are aggravated by lying down.   Pt used to carry her oxygen tank on right shoulder    Review of Systems  Constitutional: Negative for fever.       Objective:   Physical Exam  Constitutional: No distress.  Neck:       Mild decrease in ROM  Cardiovascular: Normal rate and regular rhythm.   Pulmonary/Chest: She has no wheezes. She has no rales.       Decreased BS,  Prolonged exp  Musculoskeletal:       Right shoulder - Mild decrease in ROM Mild tenderness of right trapezius area          Assessment & Plan:

## 2011-01-29 ENCOUNTER — Ambulatory Visit: Payer: Self-pay | Admitting: Internal Medicine

## 2011-01-30 ENCOUNTER — Encounter (HOSPITAL_COMMUNITY): Payer: Self-pay

## 2011-02-01 ENCOUNTER — Encounter (HOSPITAL_COMMUNITY): Payer: Self-pay

## 2011-02-06 ENCOUNTER — Encounter (HOSPITAL_COMMUNITY): Payer: Self-pay

## 2011-02-08 ENCOUNTER — Encounter (HOSPITAL_COMMUNITY): Payer: Self-pay

## 2011-02-13 ENCOUNTER — Encounter (HOSPITAL_COMMUNITY): Payer: Self-pay

## 2011-02-15 ENCOUNTER — Encounter (HOSPITAL_COMMUNITY): Payer: Self-pay

## 2011-02-20 ENCOUNTER — Encounter (HOSPITAL_COMMUNITY): Payer: Self-pay

## 2011-02-22 ENCOUNTER — Encounter (HOSPITAL_COMMUNITY): Payer: Self-pay

## 2011-02-27 ENCOUNTER — Encounter (HOSPITAL_COMMUNITY): Payer: Self-pay

## 2011-03-01 ENCOUNTER — Encounter (HOSPITAL_COMMUNITY): Payer: Self-pay

## 2011-03-06 ENCOUNTER — Encounter (HOSPITAL_COMMUNITY): Payer: Self-pay

## 2011-03-08 ENCOUNTER — Encounter (HOSPITAL_COMMUNITY): Payer: Self-pay

## 2011-03-13 ENCOUNTER — Encounter (HOSPITAL_COMMUNITY): Payer: Self-pay

## 2011-03-13 ENCOUNTER — Ambulatory Visit: Payer: Medicare FFS | Admitting: Internal Medicine

## 2011-03-13 ENCOUNTER — Ambulatory Visit (INDEPENDENT_AMBULATORY_CARE_PROVIDER_SITE_OTHER): Payer: Medicare FFS | Admitting: Adult Health

## 2011-03-13 ENCOUNTER — Encounter: Payer: Self-pay | Admitting: Adult Health

## 2011-03-13 VITALS — BP 116/82 | HR 93 | Temp 97.4°F | Ht 61.0 in | Wt 100.4 lb

## 2011-03-13 DIAGNOSIS — J449 Chronic obstructive pulmonary disease, unspecified: Secondary | ICD-10-CM

## 2011-03-13 MED ORDER — PREDNISONE 10 MG PO TABS: ORAL_TABLET | ORAL | Status: AC

## 2011-03-13 MED ORDER — CEFDINIR 300 MG PO CAPS: 300.0000 mg | ORAL_CAPSULE | Freq: Two times a day (BID) | ORAL | Status: AC

## 2011-03-13 NOTE — Progress Notes (Signed)
Subjective:    Patient ID: [name], female    DOB: 12-Feb-1941, 70 y.o.   MRN: [id number]  HPI 66 yowf last smoked 2002 with GOLD stg IV COPD FEV1 26% 6/10 on 24 h O2 since 2009.   September 28, 2010 c/o increased dyspnea, hoarse, no wheeze, no cough, no edema>>prednisone burst   October 04, 2010 --Presents foran acute office visit. Complains of DOE, heaviness in chest and pain across the back. . Was seen 1 week ago w/ copd flare tx w/ prednisone 20mg  x 5 days. Breathing did not improve at all. Wheezing and more DOE. no chest pain , no swelling, no n/v. no discolored mucus. no urinary symptoms. Feels congestion may be stuck in chest. Husband with bronchitis w/ fever. Started on abx yesterday.>>Levaquin and steroid taper   December 14, 2010 --Presents for an acute office visit. Complains of increased SOB, wheezing x5days. She was seen in ER on 2.14.12 and was given prednisone 60mg  x 1 dose without much improvement. Complains of dry cough,no discolored mucus. Wheezing worse for last few days. Esp in early am and late night. Woke up 2/14 with worsening wheezing, unable to get ov in clinic so she went to ER. She is no better and does not want to get any worse.>>tx w/ steroid taper  ,cxr w/ chronic changes   January 09, 2011 --Presents for a follow up. Had COPD flare last ov. Tx w/ steroid taper and omnicef. She is feeling better, off steroids but stiill has DOE and wears out easily. No cough or wheezing. Has low energy. Worse at night. Uses her neb rarely. No discolored mucus.    03/13/11 Acute OV  Pt presents for a work in visit. Complains of wheezing, increased SOB, sneezing, left ear congestion x1week > reports leaving for [place] in 2 days.  She has had a lot of sinus drainage, congestion and nasal stuffiness. Cough and wheezing are getting worse.  OTC not helping. Cough worse at night. No chest pain or hemoptysis.   She is going out of town in few days and is worried about being sick on the road.      Review of Systems Constitutional:   No  weight loss, night sweats,  Fevers, chills, fatigue, or  lassitude.  HEENT:   No headaches,  Difficulty swallowing,  Tooth/dental problems, or  Sore throat,               + sneezing, itching, ear ache, nasal congestion, post nasal drip,   CV:  No chest pain,  Orthopnea, PND, swelling in lower extremities, anasarca, dizziness, palpitations, syncope.   GI  No heartburn, indigestion, abdominal pain, nausea, vomiting, diarrhea, change in bowel habits, loss of appetite, bloody stools.   Resp:  Neg hemoptysis  Skin: no rash or lesions.  GU: no dysuria, change in color of urine, no urgency or frequency.  No flank pain, no hematuria   MS:  No joint pain or swelling.  No decreased range of motion.     Psych:  No change in mood or affect. No depression or anxiety.  No memory loss.         Objective:   Physical Exam GEN: A/Ox3; pleasant , NAD, elderly , frail.   HEENT:  [place]/AT,  EACs-clear, TMs-wnl, NOSE-clear drainage. , THROAT-clear, no lesions, no postnasal drip or exudate noted.   NECK:  Supple w/ fair ROM; no JVD; normal carotid impulses w/o bruits; no thyromegaly or nodules palpated; no lymphadenopathy.  RESP  Coarse BS w/ no wheezing, decreased BS in bases. no accessory muscle use, no dullness to percussion  CARD:  RRR, no m/r/g  , no peripheral edema, pulses intact, no cyanosis or clubbing.  GI:   Soft & nt; nml bowel sounds; no organomegaly or masses detected.  Musco: Warm bil, no deformities or joint swelling noted.   Neuro: alert, no focal deficits noted.    Skin: Warm, no lesions or rashes         Assessment & Plan:

## 2011-03-13 NOTE — Patient Instructions (Addendum)
Omnicef 300mg  Twice daily  For 7 days w/ food.  Mucinex DM Twice daily  As needed  .cough/congestion Claritin 10mg  daily ,As needed  Drainage  Fluids and rest.  Prednisone taper over next week.  Please contact office for sooner follow up if symptoms do not improve or worsen or seek emergency care  follow up Dr. Vassie Loll  In 6 weeks and As needed

## 2011-03-13 NOTE — Assessment & Plan Note (Signed)
[facility]                         GASTROENTEROLOGY OFFICE NOTE   NAME:PHILLIPSLogen, Fowle                    MRN:          [id number]  DATE:02/02/2008                            DOB:          02/17/1941    Mrs. Bogdanski is a 70 year old white female who was hospitalized twice  in the last two months initially when I saw her in the hospital on  December 27, 2007 to December 31, 2007, for what was thought to be ischemic  colitis as per CT scan of the abdomen which showed thickening of her  left colon.  She stayed home for one or two weeks and was readmitted  from March 27 to January 28, 2008, with what was thought to be partial  small bowel obstruction.  She resolved her obstructive symptoms on NG  suction.  Dr. Luisa Hart was following her in the hospital from a surgical  standpoint.  It was not clear where the site of the obstruction was.  The patient's prior abdominal operations included laparoscopic  cholecystectomy and appendectomy as well as hysterectomy with BSO.  Since returning from the hospital five days ago, the patient has had  severe diarrhea and has not been able really to eat well.  There has  been no fever or rectal bleeding.  She has remained on clear liquids the  last 24 hours.  Because of the acute illness, her weight has decreased  from the usual 99 pounds to currently 88 pounds.   PAST HISTORY:  Significant for oxygen-dependent COPD.   She is an ex-smoker.   MEDICATIONS:  1. Alprazolam 0.5 mg q.i.d.  2. Protonix 40 mg b.i.d.  3. Symbicort 100/6.5 two b.i.d.  4. Albuterol.  5. Mucinex.  6. Oxygen 2 liters at night.   Past history significant for asthmatic bronchitis, anxiety, chronic  headaches.   OPERATIONS:  Cholecystectomy, hysterectomy, and tubal ligation.   FAMILY HISTORY:  Positive  for colon cancer in her father's sister,  heart disease, prostate cancer, and alcoholism.   SOCIAL HISTORY:  Married with one child.  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  She lives with her spouse.  Does not smoke any more.  She  drinks alcohol only socially.   REVIEW OF SYSTEMS:  Positive for wears glasses, swelling of her feet,  vision changes, severe fatigue, confusion.  The patient had a  colonoscopy about five years ago by Dr. Jena Gauss at [facility].   PHYSICAL EXAMINATION:  Blood pressure 116/66, pulse 72, weight 88.8  pounds.  The patient was alert, oriented, somewhat teary-eyed, sclera nonicteric.  NECK:  Supple, no lymphadenopathy.  LUNGS:  With decreased breath sounds, no wheezes or rales.  COR:  With quiet precordium, normal S1, normal S2.  ABDOMEN:  Soft, scaphoid, with few bowel sounds in the left  upper and  lower quadrant but no tenderness.  There was no distention.  Well-healed  surgical scar.  Liver edge at costal margin.  RECTAL EXAM:  With yellowish mucous which was heme positive.   IMPRESSION:  A 70 year old white female with recent hospitalizations,  initially for acute colitis and subsequently  for small bowel  obstruction.  It is not clear as to the etiology of both.  She has  continued to lose weight, and she is heme positive.  CT scan done within  six weeks did not show any intra-abdominal mass or lymphadenopathy.  She  is currently having diarrhea.   PLAN:  1. A colonoscopy is scheduled.  2. Stay on clear liquids today.  Depending on the results of the      colonoscopy and biopsy, would likely proceed with small bowel      follow-through.  The patient will be hard to sedate, because she is      on Xanax 0.25 mg 4 tabs a day.  Other tests may have to be done to      further evaluation her symptoms.     Hedwig Morton. Juanda Chance, MD  Electronically Signed    DMB/MedQ  DD: 02/02/2008  DT: 02/02/2008  Job #: [id number]   cc:   Barbette Hair. Artist Pais, DO

## 2011-03-13 NOTE — Discharge Summary (Signed)
NAMEMATTHEW, PAIS             ACCOUNT NO.:  [id number]   MEDICAL RECORD NO.:  [id number]          PATIENT TYPE:  INP   LOCATION:  1512                         FACILITY:  [facility]   PHYSICIAN:  Barbette Hair. Artist Pais, DO      DATE OF BIRTH:  1941-02-28   DATE OF ADMISSION:  12/27/2007  DATE OF DISCHARGE:  12/31/2007                               DISCHARGE SUMMARY   DISCHARGE DIAGNOSES:  1. Left-sided colitis.  2. Acute blood loss anemia.  3. Oxygen and steroid dependent chronic obstructive pulmonary disease.   HISTORY OF PRESENT ILLNESS:  Ms. Westergaard is a very pleasant, 70-year-  old, white female recently seen in the office by her primary care  physician complaining of persistent weakness and left lower quadrant  pain.  The patient initially seen at Urgent Care facility approximately  1 week prior to admission again for progressive weakness.  The patient  diagnosed at that time with possible bronchitis and prescribed a Z-Pak  and prednisone.  However, the patient's weakness has persisted and she  began developing left lower quadrant pain.  The patient was started on  empiric antibiotics by her primary care physician however, her symptoms  were not improved, her weakness was progressively worse with increased  left lower quadrant pain and diarrhea. Upon evaluation in the ER, the  patient's white cell count of 23.6 with a significant left shift.  CT  scan of the abdomen and pelvis showed colitis of the sigmoid at that  time the patient was admitted for further evaluation and treatment in  addition to GI consult.   PAST MEDICAL HISTORY:  1. COPD, O2 dependent.  2. GERD.  3. Hypertension.  4. Chronic headaches.  5. Hypothyroidism.  6. Migraine.  7. Anxiety.  8. Depression.  9. Glucose tolerance  10.Status post cholecystectomy.  11.Remote hysterectomy.  12.Bladder repair.   HOSPITAL COURSE:  1. Left lower quadrant abdominal pain with sigmoid colitis per CT      scan.  The patient  admitted to rule out ischemic colitis.  She was      placed on IV zosyn. GI was asked to evaluate the patient at the      time of admission who recommended that stool cultures be obtained.      The patient was placed on full liquid diet. She was also placed on      Flagyl q.i.d. empirically in addition to Florastor b.i.d. Patient      slow clinical improvement with IV antibiotics and bowel rest. Due      to improvement, it was felt unnecessary for flexible sigmoidoscopy      as ischemic colitis highly unlikely. The patient continued with      diarrhea throughout hospitalization including some BMs with scant      blood. The patient's diarrhea was completely resolved with Imodium.      C diff toxin was negative. Scheduled Imodium recommended by Dr. Wendall Papa. The patient tolerating regular diet at time of discharge      felt medically stable to go home. She will be discharged  home to      complete a total of 10 days treatment of p.o. Flagyl.  2. Acute blood loss anemia. As mentioned above, patient with      occasional bowel movements containing scant blood.  Hemoglobin      remained stable throughout hospitalization with no acute blood loss      on exam at time of discharge. Bright red blood per rectum thought      likely secondary to problem number one and seemingly resolved at      this time.  3. O2 and steroid dependent chronic obstructive pulmonary disease. No      acute exacerbation during this hospitalization. The patient to      continue MDI and Symbicort, Spiriva and home O2. The patient's      oxygen saturations remained stable throughout this hospitalization.   DISCHARGE MEDICATIONS:  1. Flagyl 250 mg p.o. q.i.d. until gone.  2. Imodium 2 mg p.o. q.a.m. hold for diarrhea.  3. Spiriva 18 mcg 1 inhalation daily.  4. Symbicort 2 puffs b.i.d.  5. Xanax 0.5 mg p.o. q.i.d.  6. Levothroid 25 mcg p.o. daily.  7. Cymbalta 6 mg p.o. daily.  8. Wellbutrin XL 150 mg p.o. daily.   9. Protonix 40 mg p.o. daily.  10.Oxygen 2 liters nasal cannula at bedtime.   PERTINENT LAB WORK AT TIME OF DISCHARGE:  White cell count 6.1, platelet  count 383, hemoglobin 10.1, hematocrit 29.4.  Sodium 142, potassium 3.9,  BUN 4, creatinine 0.42.  Blood cultures were negative for any growth.  C  diff toxin was negative. TSH to 0.432. Hepatic function panel with  normal AST, ALT and total bilirubin, albumin 2.7.   DISPOSITION:  Again, the patient felt medically stable for discharge  home.  She is instructed to follow up with Dr. Thomos Lemons, her primary  care physician, on March 30 at 2:15 p.m. In addition, she is asked by GI  to follow up with Dr. [name] on April 6 at 8:45 a.m. Also of note,  the patient did have a colonoscopy done by [facility] in 2006.      Cordelia Pen, NP      Barbette Hair. Artist Pais, DO  Electronically Signed    LE/MEDQ  D:  01/09/2008  T:  01/10/2008  Job:  [id number]   cc:   Hedwig Morton. Juanda Chance, MD  [address]  [place]  [place] [address]

## 2011-03-13 NOTE — Assessment & Plan Note (Signed)
[facility]                             PULMONARY OFFICE NOTE   NAME:[name], [name]                    MRN:          [id number]  DATE:04/28/2007                            DOB:          [birth year]    PULMONARY/FOLLOWUP OFFICE VISIT   HISTORY:  A 70 year old white female, status post smoking cessation 5  years ago, with an asthmatic component that has been controlled at  present on Symbicort 160/4.5 two puffs b.i.d. and Spiriva daily. She  also uses oxygen at bedtime, but does not find that she needs it or any  albuterol during the day typically. She did have one day where she  needed a nebulizer when it was real hot, but denies any increase in  dyspnea since that time nor need for albuterol or nocturnal awakening,  fevers, chills, cough or leg swelling.   For full inventory of medications, please see face sheet dated April 28, 2007.   PHYSICAL EXAMINATION:  She is a pleasant, ambulatory, chronic ill, white  female in no acute distress. She has stable vital signs.  HEENT: Is unremarkable. Oropharynx is clear.  LUNGS: Lung fields reveal diminished breath sounds without wheezing.  HEART: Regular rate and rhythm without murmur, gallop or rub.  ABDOMEN: Soft, benign.  EXTREMITIES: Warm without calf tenderness, cyanosis, clubbing or edema.   MDI technique was reviewed and is only 50% at baseline (the same as she  did previously), but 75+% with coaching (her inspiratory time is  relatively short so she needs to really concentrate on exhaling all the  way down to RV and then triggering it the same time she breathes in to  get maximum time after triggering to get the 4 second inspiration that  we typically recommend).   Having said this, however, she is doing well enough on her present  regimen and shows enough insight into self medication with use of either  albuterol MDI or nebulizer to see her here on a p.r.n. and let Dr.  Artist Pais  refill her  medications. Will see her back here p.r.n.   I did emphasize the 3-5 day rule for the patient that most COPD  exacerbations occur over 3 to 5 days and giving her plenty of time to  see Dr.  Artist Pais, my nurse practitioner or me.     Charlaine Dalton. Sherene Sires, MD, [facility]  Electronically Signed    MBW/MedQ  DD: 04/28/2007  DT: 04/28/2007  Job #: [id number]   cc:   Barbette Hair. Artist Pais, DO

## 2011-03-13 NOTE — Consult Note (Signed)
[name], [name]             ACCOUNT NO.:  [id number]   MEDICAL RECORD NO.:  [id number]          PATIENT TYPE:  INP   LOCATION:  1522                         FACILITY:  [facility]   PHYSICIAN:  Maisie Fus A. Cornett, M.D.DATE OF BIRTH:  06/30/1941   DATE OF CONSULTATION:  01/22/2008  DATE OF DISCHARGE:                                 CONSULTATION   REASON FOR CONSULTATION:  Abdominal pain.   HISTORY OF PRESENT ILLNESS:  The patient is a pleasant 70 year old  female with a past history of home O2 dependent COPD with a one day  history of abdominal pain.  Her pain started about 3 o'clock this  afternoon.  It was sharp in nature and located throughout her abdomen,  specifically in her right lower quadrant.  The pain was associated  nausea and vomiting with minimal relief.  She was brought to the  emergency room by her husband to be evaluated.  She is complaining of  abdominal pain, right lower quadrant and lower abdomen.  Her last bowel  movement was yesterday.  She is having nausea and vomiting.  She had  some problems with diarrhea over the last two months, has been  hospitalized on a couple of occasions for workup without any obvious  etiology.  She has not seen any blood in her stool or urine.  I was  asked to see her at the request of Dr. Freida Busman for consultation.  CT was  obtained which showed a distal small bowel obstruction with air and  stool in the colon still.   PAST MEDICAL HISTORY:  1. COPD.  2. Former smoker.  3. Home O2 at night.  4. History of colitis and loose stool.  5. History of depression.   PAST SURGICAL HISTORY:  1. Laparoscopic appendectomy.  2. Abdominal hysterectomy.   FAMILY HISTORY:  Noncontributory.   SOCIAL HISTORY:  She is a former smoker.  She does drink alcohol  occasionally.  She is married.   ALLERGIES TO MEDICINES:  No known drug allergies.   MEDICATIONS:  Include albuterol nebs, DuoNeb, Spiriva handheld inhaler,  Xanax 1 mg at bedtime, and  Symbicort twice a day.   REVIEW OF SYSTEMS:  She does have issues with shortness of breath and is  on home CO2.  Otherwise, 15 point review of systems negative.   PHYSICAL EXAMINATION:  VITAL SIGNS:  Temperature 97, pulse 105, blood  pressure 118/71, respiratory rate 20.   GENERAL:  Thin but pleasant female with nasogastric tube in nose, in no  apparent distress.  HEENT:  Extraocular movements are intact.  Oropharynx dry.  NECK:  Supple, nontender, full range of motion.  CHEST:  Clear to auscultation.  She does have some occasional wheezes  bilaterally.  Chest wall somewhat barrel chested.  CARDIOVASCULAR:  Regular rate and rhythm without murmur, rub, or gallop.  EXTREMITIES:  Warm and well perfused.  ABDOMEN:  Distended but minimally tender. Bowel sounds are normal  active. Otherwise, relatively soft currently. No hernia noted.  EXTREMITIES:  Muscle tone normal, range of motion normal.  NEUROLOGICAL:  Glasgow coma scale was 15.  Motor and  sensory function  grossly intact.   LABORATORY DATA:  She has a white count 22,600, hemoglobin 15.  Urinalysis is normal. Electrolytes showed sodium 142, potassium 3.9,  chloride 104, CO2 29, BUN 21, creatinine 0.5, glucose 153, albumin is  4.2, lipase normal at 22.  CT scan of the abdomen and pelvis is  reviewed.  There is dilatation down to the distal ileum just before the  ileocecal valve.  Colon and stool were of normal caliber.  There is some  decompressed small bowel at the terminal ileum, quite short segment.  There is no free fluid in the abdomen or free air.   IMPRESSION:  1. Questionable partial small bowel obstruction versus complete small      bowel obstruction.  2. COPD.  3. History of colitis, etiology unknown.   PLAN:  At this point in time, will admit for IV fluids, nasogastric tube  decompression, and will follow her in the next 24-48 hours.  If she does  not open up, she will require exploration at that point in time.  She  is  nontoxic appearing.  Her abdomen is soft and nontender.  I do not see  any acute surgical intervention necessary at this point.  I have  discussed this with the patient and her husband, they voiced  understanding and agreed to proceed.      Thomas A. Cornett, M.D.  Electronically Signed     TAC/MEDQ  D:  01/23/2008  T:  01/23/2008  Job:  [id number]

## 2011-03-13 NOTE — H&P (Signed)
NAMEDEJON, JUNGMAN             ACCOUNT NO.:  [id number]   MEDICAL RECORD NO.:  [id number]          PATIENT TYPE:  INP   LOCATION:  2012                         FACILITY:  [facility]   PHYSICIAN:  Audery Amel, MD    DATE OF BIRTH:  1941/05/09   DATE OF ADMISSION:  07/02/2008  DATE OF DISCHARGE:                              HISTORY & PHYSICAL   CHIEF COMPLAINT:  Chest pain.   HISTORY OF PRESENT ILLNESS:  Ms. Storlie is a 70 year old white female  with severe COPD, who presented to an urgent care facility earlier today  with a chief complaint of shortness of breath and chest pain.  Of note,  the patient was recently discharged from [facility] in early August  after a COPD exacerbation which was treated with antibiotics and a  steroid taper.  After hospital discharge, the patient was initially  doing well.  However, she has noted progressively worsening shortness of  breath over the last several days.  On Monday, the patient stated that  she had an episode of chest pain, which she characterized as sharp  substernal pain which radiated to the left arm.  The pain occurred at  rest and lasted several minutes.  The patient felt her symptoms were  related to GERD and took multiple doses of antacids without any relief.  She continued to have difficulty with her breathing throughout the week  and therefore presented for evaluation.  On arrival, her cardiac  biomarkers were negative x1 by point care testing.  Her EKG revealed  normal sinus rhythm with no evidence of acute injury or ischemia.  The  patient has no prior history of coronary disease.  Her functional class  is the equivalent of New York Heart Association Class III to IV,  secondary to her COPD symptoms.  She denies any chest pain today, and is  currently without complaints, hemodynamically stable.   PAST MEDICAL HISTORY:  1. Severe COPD.  2. GERD.   CURRENT MEDICATIONS:  At home.  1. Spiriva 1 p.o. daily.  2. Albuterol MDI  p.r.n.  3. Xanax 0.5 mg t.i.d. as needed.  4. Protonix 40 mg daily.   ALLERGIES:  NO KNOWN DRUG ALLERGIES.   REVIEW OF SYSTEMS:  The patient denies any orthopnea, PND, lower  extremity swelling, or recent weight gain suggestive of heart failure.  Otherwise her complete review of systems was negative except as  documented in the HPI.   FAMILY HISTORY:  There is a history of coronary disease and heart  failure in her mother, who recently passed away at age of 41.   SOCIAL HISTORY:  The patient lives in [place] and is originally  from [place].  She has recently retired from a marketing position.  She has a history of tobacco use, but quit approximately 6 years ago.  She denies any alcohol or illicit substance.   PHYSICAL EXAMINATION:  Blood pressure is 142/89, heart rate is 88, O2  sats are 96% on 2 L nasal cannula.  GENERAL:  The patient is alert and oriented x3 with notable  conversational dyspnea.  HEENT:  Normocephalic, atraumatic, EOMI, PERL, nares patent, OMP is  clear without erythema or exudate.  NECK:  Supple, full range of motion, no JVD.  Her carotid upstrokes are  equal and symmetric bilaterally with no audible bruits.  LUNGS:  Reveal diminished breath sounds, equal bilaterally with a  prolonged expiratory phase.  There is no audible wheezing noted.  CARDIOVASCULAR:  Normal S1 and S2, with no audible murmurs, rubs or  gallops.  The patient does have a barrel-shaped chest.  ABDOMEN:  Soft,  nontender, positive bowel sounds.  No hepatosplenomegaly.  EXTREMITIES:  Show no clubbing, cyanosis or edema.  Peripheral pulses  are 1+ and symmetric bilaterally.   DATA REVIEW:  EKG by my interpretation reveals normal sinus rhythm with  no evidence of acute injury or ischemia.  There is prominent right axis  deviation.   IMPRESSION:  1. Chest pain rule out myocardial infarction.  2. Severe chronic obstructive pulmonary disease.  3. Gastroesophageal reflux disease.    PLAN:  We will admit the patient to a telemetry bed for further  evaluation and monitoring.  Her symptoms are somewhat unusual for those  of an acute coronary syndrome and given that her last significant  episode of chest discomfort was Monday of this week, with negative  cardiac biomarkers, and an ACS is very unlikely.  However, the patient  does have risk factors for coronary artery disease, and further risk  stratification may be warranted pending further workup.  For now we will  treat with aspirin 325 mg once daily.  I do not see an indication for  antithrombin therapy, 2b3a inhibitors or clopidogrel at this time.  Given that she has severe COPD, we will avoid beta blockers.  She is  somewhat hypertensive on presentation and will initiate lisinopril 10 mg  once daily.  Will check a fasting lipid profile in the a.m. and consider  statin therapy if appropriate.  With regard to her COPD, we will  continue Spiriva once daily, as well as albuterol MDI's as needed.  It  does seem that her symptoms are consistent with a COPD exacerbation and  have progressively gotten worse over this past week.  Therefore we will  treat with a short course of a steroid taper initiating prednisone 20 mg  p.o. daily and tapering over the course of 1 week.  I do not feel that  antibiotics are warranted at this point given there are no signs of  infection and she has recently completed a course of Avelox.      Audery Amel, MD  Electronically Signed     SHG/MEDQ  D:  07/02/2008  T:  07/03/2008  Job:  [id number]

## 2011-03-13 NOTE — Discharge Summary (Signed)
[name], [name]             ACCOUNT NO.:  [id number]   MEDICAL RECORD NO.:  [id number]          PATIENT TYPE:  INP   LOCATION:  1522                         [facility]ACILITY:  [facility]   PHYSICIAN:  Maisie Fus A. Cornett, M.D.DATE OF BIRTH:  1941/08/30   DATE OF ADMISSION:  01/23/2008  DATE OF DISCHARGE:  01/28/2008                               DISCHARGE SUMMARY   ADMISSION DIAGNOSES:  1. Partial small-bowel obstruction.  2. History of chronic obstructive pulmonary disease.   DISCHARGE DIAGNOSES:  1. Partial small-bowel obstruction.  2. History of chronic obstructive pulmonary disease.   PROCEDURE PERFORMED:  None.   BRIEF HISTORY:  The patient is 70 year old female admitted with a  partial small-bowel obstruction.  Please see at admission note for  details.  Hospital course was unremarkable.  Her contrast on her post  admission films was in her colon by post hospital day #1.  Her bowel  function returned over the next 2 days and her NG tube was removed.  She  was passing gas, tolerating a soft diet at discharge and doing well with  resolution of her small-bowel obstruction both clinically and  radiographically.   DISCHARGE INSTRUCTIONS:  She was discharged home on January 28, 2008.  She  will resume her home medications of albuterol inhaler as needed, DuoNeb  inhaler as needed, Spiriva handheld inhaler as needed, Xanax 1 mg at  bedtime and Symbicort inhaler b.i.d.  She will follow up with her  medical doctor.  She needs no further surgical follow-up for this.   CONDITION ON DISCHARGE:  Improved.      Thomas A. Cornett, M.D.  Electronically Signed     TAC/MEDQ  D:  01/28/2008  T:  01/28/2008  Job:  [id number]

## 2011-03-13 NOTE — Discharge Summary (Signed)
[name], [name]             ACCOUNT NO.:  [id number]   MEDICAL RECORD NO.:  [id number]          PATIENT TYPE:  INP   LOCATION:  1406                         [facility]ACILITY:  [facility]   PHYSICIAN:  Rosalyn Gess. Norins, MD  DATE OF BIRTH:  01-13-1941   DATE OF ADMISSION:  06/02/2008  DATE OF DISCHARGE:  06/05/2008                               DISCHARGE SUMMARY   ADMITTING DIAGNOSIS:  Pneumonia.   DISCHARGE DIAGNOSIS:  Chronic obstructive pulmonary disease exacerbation  with no radiographic evidence of pneumonia.   CONSULTS:  None.   PROCEDURES:  Chest X-Ray: June 02, 2008, COPD without evidence of acute  cardiopulmonary disease noted.   HISTORY OF PRESENT ILLNESS:  [name] is a 70 year old woman with a  history of severe COPD using nocturnal oxygen at home who presented with  worsening shortness of breath.  She recently had been seen for COPD  exacerbation and was started on Avelox and prednisone.  She reported  increased O2 requirements despite treatment.  She complained of night  sweats and back pain.  She had been using her maintenance inhalers  regularly.  She had recently been switched from Advair to Foradil.  The  patient denied having any cough but did report chest tightness.  For  this reason she was thought to have exacerbation and probable pneumonia  and was admitted to hospital for IV antibiotics, IV steroids.   Please see the dictated H&P for past medical history, family history,  social history and examination.   HOSPITAL COURSE:  The patient was admitted to hospital and started on IV  Solu-Medrol and IV Avelox 400 mg daily.  She was continued on Spiriva,  alprazolam, Remeron, albuterol, Atrovent respiratory protocol.  The  patient on this regimen did improve.  On the second hospital day she was  doing much better and was changed to p.o. antibiotics and continued IV  steroids.  On the 7th she was switched to oral prednisone.  She  continued to do well at this  point and is ready for discharge home.   DISCHARGE EXAMINATION:  The patient standing in the room when I arrived,  dressed and ready to go home.  VITAL SIGNS:  Temperature of 97.7, blood pressure 137/90, pulse 71,  respirations 20, O2 sat was 100% on 2 liters.  GENERAL APPEARANCE:  A well-groomed woman in no acute distress.  CHEST:  Patient is moving air well but does have decreased breath sounds  throughout.  There is no wheezing noted.  There is no significant  increased work of breathing.   With the patient's symptoms being stabilized, with her lab work being  normal, with cardiac enzymes being negative x3, with D-dimer being  normal, with BNP being less than 30 she is felt to be stable and ready  for discharge home.   DISCHARGE MEDICATIONS:  The patient will continue all her home  medications.  She will be continued on Avelox 400 mg p.o. for an  additional 5 days.  She will be continued on p.o. steroids at  prednisone 20 mg b.i.d. for 3 days, then 30 mg daily for 3  days, 20 mg  daily for 3 days, 10 mg daily for 6 days.   The patient will contact Dr. Arita Miss office for followup  appointment in 7-10 days.   The patient's condition at time of discharge dictation is stable and  improved.      Rosalyn Gess Norins, MD  Electronically Signed     MEN/MEDQ  D:  06/05/2008  T:  06/05/2008  Job:  [id number]   cc:   Barbette Hair. [place], DO  [address] [place], [place] [address]

## 2011-03-13 NOTE — Assessment & Plan Note (Signed)
[facility]                            CARDIOLOGY OFFICE NOTE   NAME:[name], [name]                    MRN:          [id number]  DATE:07/27/2008                            DOB:          [birth year]    PRIMARY CARE PHYSICIAN:  Barbette Hair. Artist Pais, DO, at the [facility]  location.   HISTORY OF PRESENT ILLNESS:  This is a 70 year old with a history of  severe COPD who was seen in followup from hospitalization for chest pain  and shortness of breath.  In early September 2009, the patient presented  to the emergency department with chest tightness and severe dyspnea.  She was admitted to the hospital.  She was seen by Cardiology in  consultation, and it was thought that she was not having a cardiac  event, and rather she was having a COPD exacerbation.  Her cardiac  enzymes were negative.  Her EKG was unremarkable.  She was treated with  prednisone, doxycycline, and nebulizers, felt better the next day, and  was discharged home to follow up with an outpatient stress test.  She  did have a dobutamine Myoview study as an outpatient that was completely  normal.  She is back to her baseline symptoms.  She is on home O2 all  the time.  She is short of breath after walking 100 feet.  She is unable  to climb a flight of steps without becoming severely short of breath.  She does have some GERD-type symptoms of reflux after she eats meals.  She does not have any exertional chest pain or tightness.  She is  followed closely by Pulmonology (Dr. Sherene Sires) and by her primary care  physician Dr. Artist Pais.   PAST MEDICAL HISTORY:  1. Severe COPD on home oxygen.  The patient has a history of multiple      COPD exacerbations.  2. Gastroesophageal reflux disease.  3. Prior smoking.  The patient quit about 6 years ago.  4. Dobutamine Myoview in September 2009.  The EF was 79%.  This was a      normal study with normal perfusion at rest and stress.   MEDICATIONS:  1.  Symbicort metered-dose inhaler twice daily.  2. Spiriva metered-dose inhaler once daily.  3. Albuterol p.r.n.  4. Protonix 40 mg daily.  5. Xanax p.r.n.   SOCIAL HISTORY:  The patient quit smoking 6-7 years ago.  She has  recently retired from [employer] job.  She does live here in [place]  and does not drink significant amount of alcohol or use illicit drugs.   FAMILY HISTORY:  The patient's family history includes hypertension and  COPD.  Her mother did have congestive heart failure that developed in  her 86s, uncertain what the cause of it was.   LABORATORY DATA:  Most recent labs in September 2009, creatinine 0.6,  LDL 106, HDL 62, triglycerides 48.   REVIEW OF SYSTEMS:  Negative except as noted in the history of present  illness.   PHYSICAL EXAMINATION:  VITAL SIGNS:  Blood pressure today is 150/80,  heart rate is 90 and regular, weight  is 97 pounds.  GENERAL:  This is a thin female in no apparent distress.  NEUROLOGIC:  Alert and oriented x3.  Normal affect.  LUNGS:  Distant breath sounds throughout with end-expiratory wheezes.  CARDIOVASCULAR:  Distant heart sounds.  Regular S1 and S2.  I am unable  to hear a murmur or gallop.  There is no peripheral edema.  There are 2+  dorsalis pedis pulses bilaterally.  There is no carotid bruit.  ABDOMEN:  Soft, nontender.  No hepatosplenomegaly.  Normal bowel sounds.  EXTREMITIES:  No clubbing or cyanosis.  NECK:  No JVD.  There is no thyromegaly or thyroid nodule.  There is no  lymphadenopathy.  HEENT:  Normal exam.  SKIN:  Normal exam.  MUSCULOSKELETAL:  Normal exam.   EKG was reviewed today, shows normal sinus rhythm.  There is a right  axis deviation with borderline right atrial enlargement.  This appears  to be a pulmonary disease pattern.   ASSESSMENT AND PLAN:  This is a 70 year old with severe chronic  obstructive pulmonary disease who presented to the hospital earlier this  month with some chest pain associated with  chronic obstructive pulmonary  disease exacerbation.  1. Chest pain.  The chest pain that the patient had on admission in      September is likely due to chronic obstructive pulmonary disease      exacerbation related to wheezing and bronchospasm.  She did have a      dobutamine Myoview that was completely normal later this month.      After her discharge, I did tell her that she should be taking an      aspirin 81 mg a day.  Otherwise, she does not need regular      cardiology followup.  She can see [place] back in the office here as      needed on a p.r.n. basis in the future.  2. Elevated blood pressure.  The patient's blood pressure is 150/80      today.  However, she tells me that at her primary care physician's      office yesterday, her blood pressure was reading low.  I wonder if      this is due a component of anxiety coming to this office for the      first time.  I did tell her to make sure she talks to her primary      care physician about following her blood pressure quite closely.      If she does have another elevated reading, I think it probably      would be reasonable to start her on low dose of antihypertensive      medication.     Marca Ancona, MD  Electronically Signed    DM/MedQ  DD: 07/27/2008  DT: 07/27/2008  Job #: [id number]   cc:   Barbette Hair. Artist Pais, DO  Arturo Morton. Riley Kill, MD, [facility]

## 2011-03-13 NOTE — Discharge Summary (Signed)
NAMEDEBBE, CRUMBLE             ACCOUNT NO.:  [id number]   MEDICAL RECORD NO.:  [id number]         PATIENT TYPE:  CINP   LOCATION:                               FACILITY:  [facility]   PHYSICIAN:  Bevelyn Buckles. Bensimhon, MDDATE OF BIRTH:  06/02/1941   DATE OF ADMISSION:  07/02/2008  DATE OF DISCHARGE:  07/04/2008                               DISCHARGE SUMMARY   PRIMARY CARDIOLOGIST:  Marca Ancona, MD   DISCHARGE DIAGNOSES:  Shortness of breath and chest discomfort felt to  be respiratory related with a history of chronic obstructive pulmonary  disease on home oxygen therapy.   Past medical history includes COPD with home O2 and GERD.   HOSPITAL COURSE:  Ms. Ziebarth is a 70 year old female who presented to  Urgent Care on day of admission complaining of shortness of breath and  chest discomfort.  The patient was transported to [facility] for further  evaluation.  Cardiac markers were negative.  EKG sinus rhythm without  acute changes.  No prior history of CAD.  The patient was admitted for  observation, treated with prednisone and doxycycline.  Shortness of  breath much better on day of discharge.  The patient sating 98% on O2  being discharged home.  Followup outpatient with Dr. Shirlee Latch.  Office  will call the patient for an appointment and will schedule a dobutamine  stress test.  Office will call the patient with this appointment also.   At the time of discharge, doxycycline 100 mg p.o. b.i.d. for 7 days,  prednisone taper 40 mg to 10 mg.  The patient instructed to continue her  home medications including Spiriva, albuterol, Xanax, and protein as  previously prescribed.   DURATION OF DISCHARGE ENCOUNTER:  Less than 30 minutes.      Dorian Pod, ACNP      Bevelyn Buckles. Bensimhon, MD  Electronically Signed    MB/MEDQ  D:  07/04/2008  T:  07/05/2008  Job:  [id number]

## 2011-03-13 NOTE — Assessment & Plan Note (Signed)
Exacerbation  Plan;  Omnicef 300mg  Twice daily  For 7 days w/ food.  Mucinex DM Twice daily  As needed  .cough/congestion Claritin 10mg  daily ,As needed  Drainage  Fluids and rest.  Prednisone taper over next week.  Please contact office for sooner follow up if symptoms do not improve or worsen or seek emergency care

## 2011-03-16 NOTE — Assessment & Plan Note (Signed)
[facility]                           PRIMARY CARE OFFICE NOTE   NAME:[name], [name]                    MRN:          [id number]  DATE:11/04/2006                            DOB:          [birth year]    CHIEF COMPLAINT:  New patient to practice.   HISTORY OF PRESENT ILLNESS:  The patient is a 70 year old white female  here to establish primary care.  She was previously followed by Dr.  Felecia Shelling in [place].  She has been followed by Dr. Sherene Sires for history of  severe chronic obstructive emphysematous pulmonary disease.  She had a  pulmonary function test in November of 2007.  The patient was noted to  have FEV-1/FVC at 30% with increased residual volume and moderate  decrease in diffusion capacity.   Over the holidays, Christmas/New Years, the patient experienced  increased cough and shortness of breath.  She started prednisone and was  given 5 days of Avelox; her symptoms have improved, but cough has not  completely resolved.  She does not have any issues with chronic  longstanding cough.   She, in the past, was tried on Protonix with equivocal response;  however, over the last 63-month time period, the patient notes increasing  GERD-like symptoms, especially at night when she is supine.  She also  noted some dysphagia with food items getting stuck in the lower part of  her esophagus.   PAST MEDICAL HISTORY:  1. Emphysematous chronic obstructive lung disease.  2. Hypertension.  3. History of depression.  4. History of migraine headache.  5. History of cholecystectomy in 2007.  6. Status post hysterectomy in 1993.   CURRENT MEDICATIONS:  1. Symbicort 8/4.5 twice daily.  2. Spiriva one capsule daily.  3. Wellbutrin SR 150 mg once a day.  4. Xanax 0.5 mg q.i.d.  5. Estrogen/methyltestosterone 1.25/25 one a day.  6. Cymbalta 60 mg once a day.  7. The patient uses O2 overnight at 2 L/min.   ALLERGIES TO MEDICATIONS:  None known.   SOCIAL  HISTORY:  The patient is married and lives with her husband.  She  has 1 daughter that is 20.  She is still employed as the  Psychologist, sport and exercise.   FAMILY HISTORY:  1. Mother alive at age 56 and has a history of alcoholism, heart      disease, stroke, hypertension and kidney disease.  2. Father deceased at age 54 secondary to automobile accident related      to DWI.  3. The patient has a brother who recently died secondary to      complications of COPD.   HABITS:  She occasionally drinks.  She quit tobacco in 2003; she was  previously a 2-pack-per-day smoker.   REVIEW OF SYSTEMS:  No fevers or chills.  Positive wet cough with some  productive sputum.  Positive GI complaints, as noted above.  No dysuria,  frequency or urgency.  All other systems negative.   PHYSICAL EXAMINATION:  VITALS:  Height is 5 feet 0.  Weight is 98  pounds.  Temperature is 97, pulse is 84 and BP is 133/87.  GENERAL:  The patient is a pleasant, thin 70 year old white female who  appears older than stated age.  HEENT:  Normocephalic, atraumatic.  Pupils are equal and reactive to  light bilaterally.  Extraocular movement today was intact.  The patient  was anicteric.  Conjunctivae were within normal limits.  External  auditory canals and tympanic membranes were clear bilaterally.  Oropharyngeal exam was unremarkable.  NECK:  Supple with no adenopathy, carotid bruit or thyromegaly.  CHEST:  The patient had decreased breath sounds at the bases with mild  rhonchi with fine crackles.  CARDIOVASCULAR:  Regular rate and rhythm.  No significant murmurs, rubs,  or gallops appreciated.  Heart sounds were somewhat distant.  ABDOMEN:  Soft and nontender.  Positive bowel sounds.  No organomegaly.  MUSCULOSKELETAL:  No clubbing, cyanosis, or edema.  SKIN:  Warm and dry.  The patient had intact dorsalis pedis pulses, equal and symmetric.  Cranial nerves II-XII were grossly intact.  She was nonfocal.   IMPRESSION:   1. Severe chronic obstructive pulmonary disease with bronchitis      exacerbation.  2. Gastroesophageal reflux disease with report of dysphagia.  3. Depression/anxiety.  4. History of migraine headache, quiescent.  5. Health maintenance.   RECOMMENDATIONS:  The patient was started on doxycycline 100 mg b.i.d.  x10 days.  I suspect that her GERD may be also exacerbating COPD and  there is a chance that her long-acting beta agonist may be contributing  to worsening GERD symptoms; therefore, she will be tried on Qvar 80 mcg  inhaled b.i.d. in lieu of Symbicort.  If respiratory status worsens, she  is to resume Symbicort.   She was also started on omeprazole 20 mg b.i.d. and we will obtain an  outpatient barium swallow to rule out any lower esophageal mass.  The  patient is certainly at higher risk with long history of tobacco abuse.   In terms of health maintenance, she has not received a flu vaccine and  she was updated today.  I encouraged the patient to obtain/schedule a  screening DEXA scan before her followup visit in approximately 1-2  months.     Barbette Hair. Artist Pais, DO  Electronically Signed    RDY/MedQ  DD: 11/04/2006  DT: 11/05/2006  Job #: [id number]

## 2011-03-16 NOTE — Assessment & Plan Note (Signed)
[facility]                               PULMONARY OFFICE NOTE   NAME:[name], [name]                    MRN:          [id number]  DATE:07/11/2006                            DOB:          Feb 08, 1941    HISTORY OF PRESENT ILLNESS:  The patient is a 70 year old white female  patient of Dr. Sherene Sires who has a known history of severe COPD with a baseline  FEV1 of only 40% of the predicted on nocturnal oxygen.  The patient returns  for a one-month followup.  The patient had recently been changed from Advair  over to Symbicort 80/4.5 with two puffs twice daily due to suspected upper  airway irritability with cough and hoarseness thought to be secondary from  Advair dry powder inhaler.  Since last visit, the patient reports she is  much improved, with resolution of hoarseness and decreased shortness of  breath.  The patient reports that she has also been able to decrease her  rescue inhaler use down to one to two albuterol uses a day.  Previously, the  patient had been using it up to four or five times a day.  She has had to  use none of her nebulizer since last visit one month ago.  The patient  denies any chest pain, productive sputum, fever, orthopnea, PND, or leg  swelling.  The patient has brought all of her medications in today for  review, and they do match our medication list.   PHYSICAL EXAMINATION:  GENERAL:  The patient is a pleasant female in no  acute distress.  VITAL SIGNS:  She is afebrile with stable vital signs.  HEENT:  Unremarkable.  NECK:  Supple without adenopathy.  LUNGS:  Lung sounds are diminished in the bases, otherwise clear.  CARDIAC:  Regular rate.  ABDOMEN:  Soft.  EXTREMITIES:  Warm without any edema.   IMPRESSION AND PLAN:  1. Severe chronic obstructive pulmonary disease with a suspected component      of asthma.  The patient seems to be with improved control and      resolution of upper airway irritability with  hoarseness since      discontinuing Advair.  The patient will continue on Symbicort 80/4.5      two puffs twice daily along with Spiriva.  The patient will recheck      here in eight weeks with Dr. Sherene Sires for a full set of pulmonary function      tests and followup or sooner as needed.  2. Complex medication regimen.  The patient's medications were reviewed in      detail.  Patient education was provided, and computerized medication      encounter was completed for this patient and reviewed in detail.  The      patient is aware to bring this back to each and every visit.                                   Rubye Oaks, NP  Charlaine Dalton. Sherene Sires, MD, [name]   TP/MedQ  DD:  07/11/2006  DT:  07/12/2006  Job #:  [id number]

## 2011-03-16 NOTE — Assessment & Plan Note (Signed)
[facility]                             PULMONARY OFFICE NOTE   NAME:[name]                    MRN:          [id number]  DATE:01/20/2007                            DOB:          [birth year]    HISTORY OF PRESENT ILLNESS:  The patient is a 70 year old white female  patient of Dr. Sherene Sires who has a history of emphysematous COPD who presents  today for followup.  The patient is maintained on Symbicort 80/4.5 two  puffs twice daily along with Spiriva daily and nocturnal oxygen.  The  patient reports she had been doing very well up until 2 days ago when  she started having a minimally productive cough and wheezing and  shortness of breath with activity.  The patient had to increase her  albuterol use up to 3 times a day.  The patient reports  on average on  her good days, she uses albuterol about once a day. When she walks for  a long distance, she feels somewhat short of breath and requires  albuterol.  The patient denies any purulent sputum, fever, chest pain,  leg swelling, orthopnea, or PND.  The patient was recently seen by Dr.  Artist Pais for primary care.  The patient was recommended to change from Qvar  over to Symbicort; however, the patient did not change this inhaler as  of yet, wanted to discuss it with Dr. Sherene Sires . The patient continues on  Symbicort 2 puffs  twice daily.  The patient was also set up for a  barium swallow on December 16, 2006, which showed a sliding hiatal  hernia and a mild to moderate degree of reflux.  The patient also was  shown to have diffuse esophageal spasm with loss of primary peristaltic  wave.  The patient was recommended to increase Protonix to twice a day  which she has done.   PAST MEDICAL HISTORY:  Reviewed.   CURRENT MEDICATIONS:  Reviewed.   PHYSICAL EXAMINATION:  GENERAL:  The patient is a pleasant female in no  acute distress.  VITAL SIGNS:  She is afebrile with stable vital signs.  O2 saturation  96% on  room air.  HEENT:  Unremarkable.  NECK:  Supple without cervical adenopathy.  No JVD.  LUNGS:  Diminished breath sounds with a few expiratory wheezes.  CARDIAC:  Regular rate.  ABDOMEN:  Soft and nontender.  EXTREMITIES:  Warm without any edema.   IMPRESSION AND PLAN:  1. Mild chronic obstructive pulmonary disease flare.  The patient is      recommended to begin a short prednisone taper.  Add Mucinex DM      twice daily.  The patient will return back with Dr. Sherene Sires in 4 weeks      or sooner if needed.  2. Complex medication regimen.  The patient's medication list was      reviewed in detail.  A new computerized calendar was adjusted,      given, and reviewed with patient.  The patient is aware to bring      this back to each and every visit.  3.  Gastroesophageal reflux.  The patient will continue on Protonix      twice daily  The patient continues to have frequent chronic      obstructive pulmonary disease flares along with      symptoms.  May need to add in Reglan to her present regimen and/or      refer to gastroenterology.      Rubye Oaks, NP  Electronically Signed      Charlaine Dalton. Sherene Sires, MD, [facility]  Electronically Signed   TP/MedQ  DD: 01/20/2007  DT: 01/20/2007  Job #: [id number]

## 2011-03-16 NOTE — Assessment & Plan Note (Signed)
[facility]                             PULMONARY OFFICE NOTE   NAME:[name], [name]                    MRN:          [id number]  DATE:02/24/2007                            DOB:          Oct 03, 1941    HISTORY:  A 70 year old white female who quit smoking in 2003 with a  baseline FEV1 of only 33% predicted, documented in November of 2007 with  a diffusing capacity of 47% with baseline dyspnea with mental activity.  She said she was much better following hospitalization on prednisone but  now has tapered prednisone off effective two weeks ago and having  increasing dyspnea for the last several days to the point where she is  short of breath with anything more than slow ADLs. She was supposed to  be on oxygen 24 hours a day but has switched this back to p.r.n. basis.  She has not remembered to use albuterol as instructed previously on  p.r.n. basis.   Note, that she is maintained on Symbicort 80/4.5 two puffs b.i.d. but  only takes the Spiriva at bedtime rather than in the morning as  instruction sheet indicates.   For full inventory of medications, please see face sheet dated February 24, 2007.   On physical examination, she is a thin, ambulatory, anxious white female  in no acute distress. She has normal vital signs.  HEENT:  Unremarkable. Oropharynx clear.  LUNGS:  Fields reveal diminished breath sounds bilaterally with no  localized generalized wheezing or rhonchi. There is marked increase in  expiratory time.  CARDIAC:  Regular rate and rhythm without murmurs, rubs, or gallops. No  increase in P2.  ABDOMEN:  Soft, benign, with negative Hoover's sign.  EXTREMITIES:  Warm without calf tenderness, cyanosis, clubbing or edema.   Heme saturation is 92% on room air.   Chest x-ray was repeated and revealed severe hyperinflation. There are  increased markings in the right greater than left base but no definite  evidence of atelectasis or  effusion.   IMPRESSION:  Severe chronic obstructive pulmonary disease that is  nearing end-stage and may have been somewhat better on prednisone and  somewhat worse now that prednisone is tapered off. Before we can declare  the topicals ineffective, however, in the form of her present regimen, I  have recommended she take it the way the medication calendar indicates,  namely the Spiriva should be taken each morning, not each night, and I  would like to take this opportunity to increase the Symbicort to 160/4.5  two puffs b.i.d. and return in 4 weeks for followup PFTs to compare to  previous studies.   I spent extra time with this patient going over each and every one of  her maintenance medications versus her p.r.n.'s explaining the purpose  for them and also making sure that she could use optimal MDI and DBI  technique (which she both mastered to 75+ effectiveness but never to  100%).   If she worsens on the present regimen, the other option might be to  switch her over to formoterol/budesonide in a b.i.d. nebulizer form.  She also need to remember to use her albuterol on a p.r.n. basis when  she is having a bad day to see to what extent she improves as  previously instructed and reflected on her medication calendar.     Charlaine Dalton. Sherene Sires, MD, [facility]  Electronically Signed    MBW/MedQ  DD: 02/24/2007  DT: 02/25/2007  Job #: [id number]

## 2011-03-16 NOTE — Op Note (Signed)
NAME:  [name], [name]                       ACCOUNT NO.:  [id number]   MEDICAL RECORD NO.:  [id number]                   PATIENT TYPE:  AMB   LOCATION:  DAY                                  FACILITY:  [facility]   PHYSICIAN:  R. Roetta Sessions, M.D.              DATE OF BIRTH:  Jan 13, 1941   DATE OF PROCEDURE:  09/27/2003  DATE OF DISCHARGE:                                 OPERATIVE REPORT   PROCEDURE:  Screening colonoscopy.   INDICATIONS:  The patient is a 70 year old lady sent over courtesy of Dr.  Felecia Shelling for colorectal cancer screening.  She has not had any rectal bleeding,  no abdominal pain, no prior imaging.  She has a positive family history of  colon cancer in a maternal aunt who was diagnosed in her 2's.  Just  recently Mrs. Suttles tells me she has had some intermittent episodes of  rectal incontinence which is unusual but denies diarrhea or other bowel  symptoms.  She has a migraine headache today.  Colonoscopy is now being done  as screening maneuver.  This approach has been discussed with the patient at  length. The potential risks, benefits and alternatives have been reviewed.  Questions answered and she is agreeable.  Please see my handwritten H&P for  more information.   DESCRIPTION OF PROCEDURE:  Oxygen saturation, blood pressure, pulse and  respiration were monitored throughout the entire procedure.  Conscious  sedation with Versed 3 mg IV and Demerol 75 mg IV in divided doses.  Phenergan 25 mg IV push prior to the procedure for nausea and headache.  The  instrument was the Olympus video chip pediatric colonoscope.   FINDINGS:  Digital rectal exam initially revealed no abnormalities.   ENDOSCOPIC FINDINGS:  Prep was good.  Rectum:  Examination of the rectal mucosa including retroflexion in the anal  verge revealed no abnormalities.  Colon:  Colonic mucosa was surveyed from the rectosigmoid junction to the  left, transverse,  right colon to the area of the  appendiceal orifice,  ileocecal valve and cecum.  These structures were well seen and photographed  for the record.  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  However, the remainder of the colonic mucosa appeared  normal from levels of the cecum to the ileocecal valve.  The scope was  slowly withdrawn.  All previously mentioned mucosal surfaces were again seen  and no other abnormalities were observed.  The patient tolerated the  procedure well and was reacted to endoscopy.   IMPRESSION:  1. Normal rectum.  2. 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  3. Otherwise normal colon.   RECOMMENDATIONS:  1. Repeat colonoscopy in five to seven years.  2. If she has further bouts of rectal incontinence, she is to let me know.      ___________________________________________  Jonathon Bellows, M.D.   RMR/MEDQ  D:  09/27/2003  T:  09/27/2003  Job:  [id number]

## 2011-03-16 NOTE — H&P (Signed)
NAMESIDDHI, [name]             ACCOUNT NO.:  [id number]   MEDICAL RECORD NO.:  [id number]          PATIENT TYPE:  EMS   LOCATION:  ED                           FACILITY:  [facility]   PHYSICIAN:  Lonia Blood, M.D.      DATE OF BIRTH:  [birth year]   DATE OF ADMISSION:  09/27/2004  DATE OF DISCHARGE:                                HISTORY & PHYSICAL   PRIMARY CARE PHYSICIAN:  Tesfaye D. Felecia Shelling, M.D., [place]   REASON FOR ADMISSION:  Severe shortness of breath.   HISTORY OF PRESENT ILLNESS:  This is a 70 year old white female with a  history of severe advanced COPD who was brought in by her family secondary  to increased and worsening shortness of breath for the past 6 days.  The  patient has had long-standing COPD, and was being managed by Dr. Felecia Shelling, as  well as at [facility].  She was on a study medication involving  Serevent and another medications; however, the patient quit taking her study  medicines in mid October due to what she called lack of any help or response  from the medications.  She has had a bout of upper respiratory infection  about a month ago, and was treated with some antibiotics and some steroid  taper.  She has been taking her Combivent and Advair; however, for the past  6 days, she has noticed increasing shortness of breath, up to the point that  she was unable to continue taking her Advair and Combivent at home, and she  came into the emergency room.  On arrival, the patient was found to be  severely tachypneic, using accessory muscles of respiration, and with  impending respiratory failure.  Hence, she has been on continuous nebulizers  prior to this admission.   PAST MEDICAL HISTORY:  1.  COPD.  2.  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  3.  Anxiety disorder.   ALLERGIES:  The patient has no known drug allergies.   SOCIAL HISTORY:  The patient lives with her husband here in [place].  They just moved here about a month ago from  [place].  She quit tobacco  smoking about 2-1/2 years ago.  Prior to that, she smoked about a pack per  day for more than 30 years.  The patient denied any alcohol intake.  She is  currently disabled.   MEDICATIONS:  1.  Advair Diskus 150 two puffs b.i.d.  2.  Combivir two puffs b.i.d.  3.  Xanax 2 mg at night.   FAMILY HISTORY:  Noncontributory at this point.   REVIEW OF SYSTEMS:  GENERAL:  Positive for excessive weight loss over time.  Nothing acute.  The patient denied any fever.  Denied any chills.  RESPIRATORY:  As in HPI.  CARDIOVASCULAR:  The patient denied any chest  pain.  Positive shortness of breath as above.  No PND, no orthopnea, no  pedal edema.  ABDOMEN:  She denied any nausea, vomiting, or diarrhea.  Denied any abdominal pain.  GU:  Denied any discharge.  Denied any dysuria  or frequency.  EXTREMITIES:  Denied any musculoskeletal complaints.   PHYSICAL EXAMINATION:  VITAL SIGNS:  Temperature is 98.0, pulse 115,  respiratory rate 30, blood pressure 186/118.  Her saturations on admission  were 100% on seven liters.  GENERAL:  The patient is very anxious.  She is in moderate to severe  distress, talking in short sentences.  She is using accessory muscles of  respiration and visibly tachypneic.  NECK:  Supple.  No JVD.  No lymphadenopathy.  CHEST:  Clear to auscultation bilaterally.  The patient is using accessory  muscles of respiration.  She has decreased air entry bilaterally with marked  wheezing.  Poor air movement bilaterally.  CARDIOVASCULAR:  The patient is tachypneic.  ABDOMEN:  Scaphoid, nontender, with positive bowel sounds.  EXTREMITIES:  No edema, cyanosis or clubbing.  SKIN:  Warm, moist.  No rashes.  NEUROLOGIC:  The patient is alert and oriented x3, and neurologic exam is  nonfocal.   LABORATORY DATA:  White count of 11.2, hemoglobin 15.5, with a normal ANC of  6.9, and absolute monocytes of 1.5.  Sodium was 142, potassium 3.3, chloride  102, CO2  of 34, glucose 99, BUN 18, creatinine 0.7, calcium 8.5, total  protein 6.0.  Her albumin is 3.0.  ABG on 7 liters revealed a pH of 7.34,  PCO2 of 58, PO2 of 193, bicarb of 31, oxygen saturation 99.3%.  Her EKG  showed sinus tachycardia with no evidence of T changes, normal intervals.  There was a right axis.  Chest x-ray shows evidence of emphysema, but no  infiltrates.   ASSESSMENT:  This is a 70 year old female with a history of severe COPD who  is coming in in acute chronic obstructive pulmonary disease exacerbation.  There is no clear-cut trigger.  The patient denied any upper respiratory  tract infection.  The most likely trigger may be the patient's inability to  take her medications correctly; however, she insists on taking them  correctly at this time.   PLAN:  1.  Chronic obstructive pulmonary disease exacerbation.  Will admit the      patient to step-down.  Start IV steroids with Solu-Medrol at high dose.      Start nebulizer scheduled q.4h.  Start her on empiric antibiotic therapy      with Avelox.  Will also give her some oxygen, but decrease the dose of      oxygen as it is right now, and titrate to keep the saturations between      90 and 93%.  Once the patient improves, will also continue with some      spirometry and see if it returns to normal, and subsequently change the      patient to p.o. steroids and taper prior to discharge.  I have also      discussed with the patient and family the fact that she may benefit from      a trial of Spiriva at the time of discharge, or at least prior to      discharge once she is back to her baseline.  2.  Anxiety disorder.  The patient takes some Xanax at night.  Will continue      Xanax while she is in the hospital.  3.  Leukocytosis.  The patient is afebrile; however, she had a white count      of 11.2.  This may or may not be related to her current symptoms.  We     will go ahead and check a UA  for a possible urinary tract  infection as      another source, although this could just be reactive.  4.  Polycythemia.  The patient's hemoglobin of 15.5 for a female is high;      however, this could just be related to her chronic obstructive pulmonary      disease exacerbation, so will continue to observe that.  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  5.  Cachexia.  The patient is fairly cachetic; however, this is most likely      related to her chronic obstructive pulmonary disease.  She would probably benefit from chest physiotherapy at some point.  She  would also probably benefit from home O2, and will try to evaluate her prior  to discharge to see if she qualifies for home O2 therapy.    Lawa  LG/MEDQ  D:  09/28/2004  T:  09/28/2004  Job:  [id number]

## 2011-03-16 NOTE — Assessment & Plan Note (Signed)
[facility]                             PULMONARY OFFICE NOTE   NAME:[name], [name]                    MRN:          [id number]  DATE:02/03/2007                            DOB:          [birth year]    HISTORY:  This patient is a very pleasant 70 year old female patient of  ours with a known history of severe chronic obstructive pulmonary  disease which is oxygen dependent and presents for a one-week post-  hospitalization visit.  The patient was admitted on January 23, 2007,  through January 28, 2007, for acute community acquired pneumonia with acute  on chronic hypercarbic respiratory failure with underlying severe  chronic pulmonary obstructive disease status post smoking cessation.  The patient was admitted to the hospital with acute onset of cough,  shortness of breath and chills.  Chest x-ray showed an infiltrate along  the right upper lobe.  She was admitted and started on broad-spectrum IV  antibiotics with Rocephin and Zithromax.  Cultures today were negative.  She is now finished her Avelox and will taper off her prednisone in the  next couple of days.  She reports she is much improved with decreased  cough, congestion and shortness of breath.   PAST MEDICAL HISTORY:  Reviewed.   CURRENT MEDICATIONS:  Reviewed.   PHYSICAL EXAMINATION:  Patient is a pleasant female in no acute  distress.  She is afebrile and stable.  VITAL SIGNS:  HEENT:  Unremarkable.  NECK:  Supple without cervical adenopathy.  No JVD.  LUNGS:  Sounds are diminished at the bases, otherwise clear.  CARDIAC:  Regular rate and rhythm.  ABDOMEN:  Soft and nontender.  EXTREMITIES:  Warm without any edema.   IMPRESSION AND PLAN:  1. Acute community acquired pneumonia of the right upper lobe.  The      patient is now finished all the antibiotics.  Will recheck here in      two or three weeks with Dr. Sherene Sires for a followup chest x-ray to      document complete clearance of the  right upper lobe infiltrate.  2. Acute on chronic hypercarbic respiratory failure.  The patient has      severe underlying chronic obstructive pulmonary disease.  The      patient continue present regimen of Symbicort and      Spiriva.  The patient is recommended to taper off prednisone and to      follow back up here in the office as scheduled or sooner if needed.      Rubye Oaks, NP  Electronically Signed      [name]. Sherene Sires, MD, [facility]  Electronically Signed   TP/MedQ  DD: 02/03/2007  DT: 02/03/2007  Job #: [id number]

## 2011-03-16 NOTE — H&P (Signed)
[name], [name]             ACCOUNT NO.:  [id number]   MEDICAL RECORD NO.:  [id number]          PATIENT TYPE:  INP   LOCATION:  1607                         FACILITY:  [facility]   PHYSICIAN:  Clinton D. Maple Hudson, MD, FCCP, FACPDATE OF BIRTH:  February 24, 1941   DATE OF ADMISSION:  01/23/2007  DATE OF DISCHARGE:                              HISTORY & PHYSICAL   PROBLEM:  A 70 year old white female, followed by Dr. [name] for pulmonary  Dr. Thomos Lemons for primary care, presented to the [facility] emergency  room tonight with chief complaint of shortness of breath and congestion.   HISTORY:  She is a former smoker with known history of chronic  obstructive pulmonary disease, FEV-1 40% of predicted using home oxygen  2 liters per minute at night.  She last been hospitalized for  respiratory problems about a year and a half ago.  She had seen Dr. [name]  about 6 weeks ago.  Four days prior to admission, she saw the office  nurse practitioner for a medication list review.  At that time, she was  congested and was given prednisone and told to take Robitussin.  She  felt better for 1-2 days but then in the next 2 days noted worsening  chest congestion with nonproductive cough.  Last night, she had a single  hard chill and at least low-grade fever.  Now in the emergency room, she  is found to have a white blood count of 23,100 with potassium of 3.3.  Chest x-ray shows right upper lobe pneumonia and COPD.  The emergency  room physician has cultured her, started nebulizer treatments, Solu-  Medrol, Rocephin and Zithromax.  I am called to admit her for medical  stabilization.   REVIEW OF SYSTEMS:  She aches all over with no specific pain or chest  pain.  Diffuse headache without significant sense of nasal congestion.  There has been no purulent bloody discharge.  No palpitation, no  adenopathy or rash.  No GI upset and no ankle edema.   HOME MEDICATIONS:  Albuterol rescue inhaler nebulizer with  DuoNeb,  Cymbalta, Wellbutrin, Xanax, Spiriva used once daily.  No medication  allergy.   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  No smokers in the home; she quit smoking in 2003.   FAMILY HISTORY:  Several heavy smokers with emphysema.   PAST MEDICAL HISTORY:  Chronic obstructive pulmonary disease without  prior pneumonia.  Her mother had tuberculosis when the patient was a  small child but TB skin tests have been negative.  Anxiety and  depression.   SURGICAL HISTORY:  Hysterectomy, cholecystectomy.   VACCINATION HISTORY:  Pneumococcal vaccine twice.   OBJECTIVE:  Temperature 99.5, pulse regular 124, respiration 24, oxygen  saturation 94% on 2 liters prongs.  GENERAL APPEARANCE:  She is now alert, calm with a tremor consistent  with stimulant medication.  She is fully oriented.  SKIN:  No evident rash.  ADENOPATHY:  None at the neck, shoulders or axilla.  HEENT:  Oral mucosa is clear.  Nasal airway is not obstructed.  Voice  quality is normal.  There is only trace neck vein distension at head of  bed 30 degrees with no stridor.  CHEST:  Diffuse chest congestion with bilateral rhonchi in the upper  lung zones.  No dullness or pleural rub.  No accessory muscle use.  She  is not coughing while I am with her.  HEART:  Regular rhythm.  No murmur, rub or gallop.  ABDOMEN:  Soft without splenomegaly.  Bowel sounds are present.  EXTREMITIES:  No edema.  Feet are warm.  Pulses are intact.  There is no  cyanosis or clubbing.  BREASTS/PELVIC/RECTAL:  Were not examined.   IMPRESSION:  1. Community-acquired pneumonia (pneumococcal vaccine twice):  Now      started on Rocephin and Zithromax pending return cultures.  2. Chronic obstructive pulmonary disease exacerbation.  3. Hypokalemia, potassium 3.3.   PLAN:  She is admitted for inpatient medical stabilization to the  service of  Dr. Sandrea Hughs who will take over tomorrow.  She will be  treated with bronchodilators, intravenous steroids, fluid replacement,  potassium replacement and antibiotic coverage for now with Rocephin and  Zithromax.      Clinton D. Maple Hudson, MD, [facility] Bollman, FACP  Electronically Signed     CDY/MEDQ  D:  01/23/2007  T:  01/24/2007  Job:  [id number]   cc:   Charlaine Dalton. [name] Sires, MD, FCCP  [address]  [place] [address]

## 2011-03-16 NOTE — Discharge Summary (Signed)
NAMEJAMESE, [name]             ACCOUNT NO.:  [id number]   MEDICAL RECORD NO.:  [id number]          PATIENT TYPE:  INP   LOCATION:  1607                         FACILITY:  [facility]   PHYSICIAN:  Casimiro Needle B. Sherene Sires, MD, FCCPDATE OF BIRTH:  Jan 15, 1941   DATE OF ADMISSION:  01/23/2007  DATE OF DISCHARGE:  01/28/2007                               DISCHARGE SUMMARY   FINAL DIAGNOSES:  1. Acute community-acquired pneumonia with acute on chronic      respiratory failure.  2. Severe underlying chronic obstructive pulmonary disease status post      remote smoking cessation.  3. Anxiety/depression.   HISTORY:  Please see dictated H&P.  This is a 70 year old white female  with severe COPD with chronic O2 dependency at night but typically able  to ambulate during the day at a slow pace without the need for oxygen.  She came in acutely ill with evidence of a right upper lobe pneumonia  and was treated with broad spectrum antibiotics (Rocephin and Zithromax)  with all of the cultures, including urine antigens, negative for  specific etiology.  She improved radiographically but had not completely  cleared but clinically was back to baseline, able to ambulate in the  hallway without oxygen, therefore is felt to be at maximum hospital  benefit.  Note, her admission bicarbonate level was 33, consistent with  hypercarbic respiratory failure as well.   She therefore discharged improved condition on the following medicines  as before:  1. Symbicort 80/4.5, two puffs twice daily.  2. Spiriva 18 mcg, one capsule every morning, take 2 puffs.  3. Wellbutrin 150 mg one daily.  4. Alprazolam 0.5 mg q.i.d.  5. Protonix 40 mg b.i.d.  6. Estrogen/methyltest one q.a.m.  7. Cymbalta 60 mg one daily.  8. O2 at 2L a minute, for now 24 hours a day.  9. Prednisone 20 mg, one daily for the next week and then will see her      in the office and taper off.  10.Avelox 400 mg, one daily for 5 days (she will complete     approximately 10 days of empiric broad spectrum coverage).   Her diet is to be a regular diet and followup will be within a week with  a chest x-ray in the office.      Charlaine Dalton. Sherene Sires, MD, [facility]  Electronically Signed     MBW/MEDQ  D:  01/28/2007  T:  01/28/2007  Job:  [id number]

## 2011-03-16 NOTE — Procedures (Signed)
NAME:  [name], [name]                       ACCOUNT NO.:  [id number]   MEDICAL RECORD NO.:  [id number]                   PATIENT TYPE:  OUT   LOCATION:  RAD                                  FACILITY:  [facility]   PHYSICIAN:  Darlin Priestly, M.D.             DATE OF BIRTH:  August 03, 1941   DATE OF PROCEDURE:  04/19/2004  DATE OF DISCHARGE:                                  ECHOCARDIOGRAM   INDICATION:  Mr. [name] is a 70 year old female patient of Dr. Felecia Shelling with a  history of increasing shortness of breath and COPD.  He is now referred for  2-D echocardiogram to evaluate LV function and valvular structures.   The aorta is within normal limits at 2.3 cm.   Left atrium is within normal limits at 2.9 cm.  There are no clots seen.  Patient in sinus rhythm during the procedure.   IVS and LVPW upper limits of normal at 1.1 and 1.0 cm, respectively.   The aortic valve appears to be minimally thickened with no evidence of  significant aortic stenosis or regurgitation.   There is mild thickening of the mitral valve leaflets with trivial mitral  regurgitation.   Structurally normal tricuspid valve with trivial tricuspid regurgitation.   Left ventricular internal dimensions within normal limits at 3.2 and 2.3 cm,  respectively.  There is good overall left ventricular function, estimated EF  60%, with no segmental wall motion abnormalities visualized.   Normal RV size and systolic function.   CONCLUSIONS:  1. Normal left ventricular size and systolic function, estimated ejection     fraction 60%.  2. Mildly thickened aortic valve with no evidence of significant aortic     stenosis or regurgitation.  3. Mildly thickened mitral valve leaflets with trivial mitral regurgitation.  4. Structurally normal tricuspid valve with trivial tricuspid regurgitation.  5. Normal right ventricular size and systolic function.      ___________________________________________               Darlin Priestly, M.D.   RHM/MEDQ  D:  04/19/2004  T:  04/19/2004  Job:  [id number]   cc:   Tesfaye D. Felecia Shelling, M.D.  [address]  [place]  [place] [address]  Fax: [phone]

## 2011-03-16 NOTE — Discharge Summary (Signed)
NAMECHARLETHA, [name]             ACCOUNT NO.:  [id number]   MEDICAL RECORD NO.:  [id number]          PATIENT TYPE:  INP   LOCATION:  0377                         [facility]ACILITY:  [facility]   PHYSICIAN:  Lonia Blood, M.D.      DATE OF BIRTH:  Aug 23, 1941   DATE OF ADMISSION:  09/27/2004  DATE OF DISCHARGE:  10/04/2004                                 DISCHARGE SUMMARY   ADDENDUM:   PRIMARY CARE PHYSICIAN:  Dr. Avon Gully in [place].   PULMONOLOGIST:  Dr. Sherene Sires, [facility].   DISCHARGE DIAGNOSIS:  Anxiety disorder.   DISCHARGE MEDICATIONS:  1.  Avelox 400 mg daily x 3 more days.  2.  Effexor 75 mg daily.  3.  Prednisone taper as follows:  60 mg p.o. b.i.d. x 3 days, then 30 mg      p.o. b.i.d. for another 3 days, then 20 mg p.o. b.i.d. x 3 days, then 10      mg p.o. b.i.d. x 3 days, then 10 mg p.o. daily x 3 days.   DISPOSITION:  The patient is being discharged in very good health.  She now  requires home O2 therapy which is being arranged.  She also has an  appointment set up with Dr. Sherene Sires of [facility] on Thursday, October 05, 2004, at 10:30 a.m.  The patient has discussed with Dr. Felecia Shelling, who has  been her primary care physician, who suggested that she gets a new  pulmonologist in town since she is going to be in [place], rather than  [place].   Other labs and conditions stay the same as per previous discharge summary.     Lawa   LG/MEDQ  D:  10/04/2004  T:  10/04/2004  Job:  [id number]   cc:   Tesfaye D. Felecia Shelling, MD  [address]  [place]  [place] [address]  Fax: [phone]   Charlaine Dalton. Sherene Sires, M.D. [facility]

## 2011-03-16 NOTE — Assessment & Plan Note (Signed)
[facility]                             PULMONARY OFFICE NOTE   NAME:[name], [name]                    MRN:          [id number]  DATE:10/25/2006                            DOB:          12-30-1940    [name] is a 70 year old white female, history of chronic  obstructive lung disease.  She is one of Dr. Thurston Hole pulmonary patients.  She is complaining of increased sore throat, body aches, and coughing up  thick, dark mucus for the last 3 days.  She had a self starting  prednisone pulse given by Dr. Sherene Sires which she filled.  It is at 40 mg,  reducing by 2 mg every 2 days until off.  She is at the 40 mg dosing.   She maintains Symbicort 80/4.5 two sprays b.i.d., Spiriva daily,  Wellbutrin 150 mg daily, alprazolam 0.5 mg 4 times daily, Cymbalta 60 mg  daily, oxygen 2 liters h.s.   EXAMINATION:  Temperature 97, blood pressure 124/84, pulse 75,  saturation 97% room air.  CHEST:  Showed distant breath sounds without evidence of wheeze or  rhonchi.  CARDIAC:  Showed a regular rate and rhythm without S3, normal S1, S2.  ABDOMEN:  Soft, scaphoid nontender.  EXTREMITIES:  Showed no edema or clubbing.   IMPRESSION:  That of acute bronchitis with chronic obstructive pulmonary  disease flair.   PLAN:  For the patient to received Avelox 400 mg a day for 5 day course  and she will continue her prednisone taper.  She will return to see Dr.  Sherene Sires in a month's time.     Charlcie Cradle Delford Field, MD, [facility]  Electronically Signed    PEW/MedQ  DD: 10/25/2006  DT: 10/25/2006  Job #: [id number]   cc:   Charlaine Dalton. Sherene Sires, MD, FCCP

## 2011-03-16 NOTE — Assessment & Plan Note (Signed)
[facility]                               PULMONARY OFFICE NOTE   NAME:[name], [name]                    MRN:          [id number]  DATE:06/13/2006                            DOB:          Aug 23, 1941    This is a pulmonary/extended followup office visit.   HISTORY:  70 year old white female with severe COPD, with a baseline FEV1 of  only 40% predicted, in for followup stating she is having trouble with hot  weather due to dyspnea.  She has difficulty carrying anything at all,  including her purse, without giving out due to dyspnea, and also has noticed  significant hoarseness since her last visit.  She denies any variability in  terms of indoor activity tolerance, which is also quite poor.  She also  denies any nocturnal symptoms of wheeze, cough, chest pain, fever, chills,  sweats, leg swelling or purulent sputum.   MEDICATIONS:  For a full inventory of medications, please see the face sheet  column dated June 13, 2006.  NOTE:  The patient has made multiple changes  and has not kept up with the medications in detail in writing, as previously  requested, and in the form of a medication calendar, which we generated but  she subsequently lost.   PHYSICAL EXAMINATION:  GENERAL:  She is a hoarse, ambulatory white female in  no acute distress.  VITAL SIGNS:  Afebrile, stable vital signs.  HEENT:  Unremarkable, oropharynx is clear.  LUNGS:  Lung fields reveal a trace wheeze bilaterally with hyperresonance to  percussion and distant breath sounds.  There is a moderate increase in  expiratory time.  CARDIAC:  There is a regular rhythm without murmur, gallop or rub.  ABDOMEN:  Soft, benign.  EXTREMITIES:  Warm without calf tenderness, cyanosis, clubbing or edema.   LABORATORY DATA:  Hemoglobin saturation 95% on room air.   IMPRESSION:  Severe chronic obstructive pulmonary disease, clinically with a  possible component of asthma and also  significant hoarseness while on Advair  dry powder inhaler.  Therefore, I trained her on the use of metered-dose  inhaler and recommended a trial of Symbicort 80/4.5 2 puffs twice daily.  When she returns in four weeks, I would like to do full medication  reconciliation and arrange to see her back in 8 weeks for pulmonary function  tests.   If she notices any worsening of her dyspnea on the Symbicort, I would like  to first try the 160 dosing before switching back to Advair, but this time  perhaps in the hydrofluoroalkane-134a mode rather than the dry powder  inhaler, which I believe is irritating the upper airway more than it is  helping her lower airway.   I regenerated a new medication calendar for the patient, and asked her to  return in 4 weeks for a full medication reconciliation.  I went over her  medicines line by line to make sure she understood the instructions and  received an updated, accurate version of her medication  calendar before she left the office.  I have asked her to bring it back with  each visit to every doctor she sees.                                   Charlaine Dalton. Sherene Sires, MD, [facility]   MBW/MedQ  DD:  06/13/2006  DT:  06/14/2006  Job #:  [id number]

## 2011-03-16 NOTE — Discharge Summary (Signed)
NAMEZELIE, ASBILL             ACCOUNT NO.:  [id number]   MEDICAL RECORD NO.:  [id number]          PATIENT TYPE:  INP   LOCATION:  0377                         FACILITY:  [facility]   PHYSICIAN:  Elliot Cousin, M.D.    DATE OF BIRTH:  1940/11/14   DATE OF ADMISSION:  09/27/2004  DATE OF DISCHARGE:                                 DISCHARGE SUMMARY   INTERIM DISCHARGE SUMMARY   PRIMARY CARE PHYSICIAN:  Tesfaye D. Felecia Shelling, M.D.   DISCHARGE DIAGNOSES:  1.  Severe chronic obstructive pulmonary disease exacerbation.  2.  Hypoxia secondary to chronic obstructive pulmonary disease (ABG on room      air October 02, 2004 revealed a pH of 7.456, pCO2 of 43.9, and a pO2 of      56.9).  3.  Hyperglycemia secondary to steroids.  4.  Hypokalemia.  5.  Leukocytosis thought to be secondary to steroids.  6.  Elevated blood pressure with no prior history of hypertension.  7.  Mild left cheek bruise secondary to a fall, now resolved.   SECONDARY DIAGNOSES:  1.  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  2.  Anxiety disorder.   DISCHARGE MEDICATIONS:  (Medication list may change prior to hospital  discharge)  1.  Oxygen therapy 2 liters per minute.  2.  Spiriva 1 inhalation daily.  3.  Albuterol MDI 2 puffs q.4h. p.r.n.  4.  Prednisone dose taper take as directed.  5.  Advair Diskus 250/50 one inhalation b.i.d.  6.  Xanax 2 mg at night and 0.25 mg q.8h. p.r.n. anxiety.  7.  Avapro 75 mg 1/2 tablet daily.   DISCHARGE DISPOSITION:  The anticipated date of discharge is unknown at this  time; however, the patient will probably be discharged in the next 2 to 4  days. She was advised to follow with Dr. Felecia Shelling in approximately 5 to 7 days  following hospital discharge.   HISTORY OF PRESENT ILLNESS:  The patient is a 70 year old lady with past  medical history significant for severe advanced COPD who was brought in by  her family secondary to increased and worsening shortness of breath for the  previous 6 days. The patient presented on September 27, 2004. The patient had  long standing COPD and has been managed by her primary care physician, Dr.  Felecia Shelling, as well as physicians at [facility]. She was on a study  medication involving Serevent and another medication; however, the patient  quit taking her study medicines in October due to what she called a lack of  any help or response from the medications. She had a recent bout of an upper  respiratory infection approximately one month ago and was treated with an  antibiotic and a steroid taper. She had been taking Combivent and Advair  prior to hospital admission; however, she began noticing more shortness of  breath up to the point where she was unable to continue taking the Advair  and Combivent at home. When she arrived to the emergency department on  September 27, 2004, she was severely tachypneic, using accessory muscles of  respiratory. There was a  concern of impending respiratory failure. While in  the emergency department, she was given continuous nebulizers prior to  hospital admission.   HOSPITAL COURSE:  #1.  CHRONIC OBSTRUCTIVE PULMONARY DISEASE EXACERBATION.  As stated above, the patient was given continuous nebulizer treatments in  the emergency department for several hours. At the time of Dr. Maxwell Caul  assessment, the patient was quite tachypneic and quite dyspneic. She was  started on supplemental oxygen at 50%. An ABG on 7 liters of oxygen (while  the patient was in the emergency room) revealed a pH of 7.34, pCO2 of 58.3,  and a pCO2 of 193. The patient's supplemental oxygen was titrated down to  keep her oxygen saturations above 92 to 93%. The patient was subsequently  admitted to the ICU for further evaluation. The patient was started on  treatment with Solu-Medrol 125 mg q.6h. as well as empiric treatment with  Avelox 400 mg daily. She was continued on treatment with albuterol and  Atrovent nebulizers q.4h. and  p.r.n. For symptomatic relief, she was started  on treatment with Robitussin DM and Tussionex cough syrup. She was started  on empiric treatment with Protonix 40 mg daily. For anticipated elevated  blood sugars, she was started on a sliding scale insulin regimen and Lantus  insulin at bedtime. Her capillary blood sugars were assessed q.a.c. and  q.h.s. After approximately 48 hours in the emergency department, the  patient's oxygen saturations ranged between 95 and 98% on 2 liters. She  appeared to be less symptomatic and was therefore transferred to a telemetry  bed. A repeat ABG on September 29, 2004 on 2 liters of nasal cannula oxygen  revealed a pH of 7.44, pCO2 of 40.9, and a pO2 of 85.8. She was maintained  on nasal cannula oxygen. The Solu-Medrol has been gradually tapered now to  60 mg IV q.12h.   The patient continues to be quite dyspneic, even when speaking, and  particularly with short distance of ambulation. The bronchospasms which were  quite audible on exam appear to have subsided but not completely resolved.  The patient does have decreased breath sounds in the bases and does not move  air very well which is probably chronic. The chest x-ray on admission  revealed markedly hyperinflated lungs but clear. A repeat chest x-ray on  October 01, 2004 again revealed marked hyperinflation consistent with COPD  exacerbation. There were central bronchitic changes which were stable. No  consolidation or fluid/edema was seen.   The patient will probably need several more days of IV Solu-Medrol,  antibiotic therapy with Avelox, and q.4h. nebulizers. When she becomes less  symptomatic, she will probably need to be discharged to home on long term  Spiriva, albuterol as needed, and a prednisone dose taper. The patient may  need oxygen therapy at the time of hospital discharge. Another ABG repeated  today on room air revealed that the patient's pH was 7.456, pCO2 43.9, and pO2 56.9. At this  point, the patient does qualify for home oxygen.   #2.  LEUKOCYTOSIS, PROBABLY SECONDARY TO STEROID TREATMENT. The patient's  WBC was 11.4 on admission. She was afebrile on admission and has remained  afebrile during the entire hospital course. A followup CBC increased to 12.6  and again to 19.1. The patient has no acute obvious signs of infection;  however, she is being treated empirically with Avelox for COPD exacerbation.  The leukocytosis is felt to be secondary to high dose steroid therapy.   #3.  HYPERGLYCEMIA.  The patient's capillary blood sugars increased to a high  of the mid 200s during hospital course. The hyperglycemia was thought to be  secondary to steroid therapy. The patient has no history of diabetes  mellitus. She was started on a sliding scale insulin regimen as well as  Lantus 10 units at bedtime. As the Solu-Medrol has been tapered, the Lantus  dosing and the sliding scale insulin regimen has been decreased. The patient  probably will not need insulin therapy at the time of hospital discharge.   #4.  HYPOKALEMIA. The patient's potassium on admission was 3.3. She was  started on potassium chloride orally. A followup chemistry panel revealed  that the potassium was 3.1. The hypokalemia was probably secondary to the  albuterol and Solu-Medrol. The patient was repleted further with potassium  chloride not only orally but also in the IV fluids. At the time of this  dictation, the patient's potassium level has improved to 4.4.   #5.  ELEVATED BLOOD PRESSURE. The patient has no prior history of  hypertension by history; however, during the hospital course, the patient's  blood pressure began to elevate into the 160s to 170s systolically. The  elevated blood pressures may have been secondary to the Solu-Medrol  treatment as well as the IV fluids. The Solu-Medrol dosing is being tapered,  and the IV fluids have been decreased. The patient, however, has been  started on  Avapro 75 mg 1/2 tablet daily. The dose of Avapro may need to be  increased to 75 mg daily. It is possible that the patient may not need  antihypertensive treatment in the outpatient setting once she is off of high  dose steroids.   DISPOSITION:  The anticipated date of discharge is not known. The patient  will be further assessed and managed by Dr. Mikeal Hawthorne over the next few days. He  will determine the exact disposition for the patient. The patient will need  to follow up with her primary care physician, Dr. Felecia Shelling, within 5 to 7 days  of hospital discharge.     Deni   DF/MEDQ  D:  10/02/2004  T:  10/02/2004  Job:  [id number]   cc:   Tesfaye D. Felecia Shelling, MD  [address]  [place]  [place] [address]  Fax: [phone]

## 2011-03-16 NOTE — Assessment & Plan Note (Signed)
[facility]                             PULMONARY OFFICE NOTE   NAME:PHILLIPSBreeana, [name]                    MRN:          [id number]  DATE:09/26/2006                            DOB:          01/29/1941    Thursday, September 26, 2006   A 70 year old white female who quit smoking in 2003 and had symptoms  predominantly of hoarseness and dyspnea that I thought might represent  upper airway instability related to Advair and/or reflux.  She was  treated short-term with Protonix but has been maintained off of Advair  and on Symbicort 80/4.5 two puffs b.i.d. and comes back stating she is  the best she has been in a long time.  She has no trouble with ADLs and  rarely using any albuterol MDI, does not require any nebulizer therapy  at all.  She denies any fevers, chills, sweats, orthopnea, PND or leg  swelling.  No significant cough, chest pain or leg swelling.   MEDICATIONS:  For a full inventory of medications, please see medication  face sheet column dated September 26, 2006, that carries an entire list  of maintenance and p.r.n.'s that matches up exactly with her counter.   PHYSICAL EXAMINATION:  GENERAL APPEARANCE:  She is a pleasant ambulatory  white female in no acute distress.  VITAL SIGNS:  Afebrile with stable vital signs.  HEENT:  Unremarkable.  Oropharynx is clear.  LUNGS:  Diminished breath sounds bilaterally with no wheezing.  CARDIOVASCULAR:  Regular rate and rhythm without murmurs, rubs, or  gallops.  ABDOMEN:  Soft and benign.  EXTREMITIES:  Warm without calf tenderness, clubbing, cyanosis, or  edema.   PFTs were with an FEV1 of 33% with a ratio of 30% and a FRC of 142% with  diffusion capacity of 47%.   IMPRESSION:  Classic emphysematous chronic obstructive pulmonary disease  with no significant asthmatic component on present regimen which does  include Symbicort.  Since she is doing so much better on Symbicort I am  reluctant to stop  it for now but I believe she is getting most of the  benefit from using Spiriva and obviously maintaining abstinence from  cigarettes.   Unfortunately this has left her with nocturnal hypoxemia that is likely  to still be present given the fact that her saturation is only 90% on  room air.  However, the good news is that her lung function appears to  be relatively stable clinically without significant asthmatic or  valuable component and she can use this to her advantage by pacing  herself with activities that make her short of breath but never out of  breath.   I have advised her that if she has an increased need for albuterol,  especially for the nebulizer, I need to see her back here, otherwise  will see her every three months until she establishes with a primary  care physician here in [place], [place].     Charlaine Dalton. Sherene Sires, MD, [facility]  Electronically Signed    MBW/MedQ  DD: 09/26/2006  DT: 09/27/2006  Job #: [id number]

## 2011-04-05 ENCOUNTER — Encounter: Payer: Medicare FFS | Admitting: Internal Medicine

## 2011-04-05 ENCOUNTER — Other Ambulatory Visit: Payer: Self-pay | Admitting: Internal Medicine

## 2011-04-05 ENCOUNTER — Other Ambulatory Visit (INDEPENDENT_AMBULATORY_CARE_PROVIDER_SITE_OTHER): Payer: Medicare FFS | Admitting: Internal Medicine

## 2011-04-05 ENCOUNTER — Other Ambulatory Visit (INDEPENDENT_AMBULATORY_CARE_PROVIDER_SITE_OTHER): Payer: Medicare FFS

## 2011-04-05 DIAGNOSIS — Z79899 Other long term (current) drug therapy: Secondary | ICD-10-CM

## 2011-04-05 DIAGNOSIS — Z0000 Encounter for general adult medical examination without abnormal findings: Secondary | ICD-10-CM

## 2011-04-05 LAB — LIPID PANEL
Cholesterol: 222 mg/dL — ABNORMAL HIGH (ref 0–200)
HDL: 81 mg/dL (ref >39.00)
Total CHOL/HDL Ratio: 3
VLDL: 29.4 mg/dL (ref 0.0–40.0)

## 2011-04-05 LAB — HEPATIC FUNCTION PANEL
Albumin: 4.5 g/dL (ref 3.5–5.2)
Alkaline Phosphatase: 65 U/L (ref 39–117)
Bilirubin, Direct: 0.1 mg/dL (ref 0.0–0.3)
Total Protein: 7.2 g/dL (ref 6.0–8.3)

## 2011-04-05 LAB — LDL CHOLESTEROL, DIRECT: Direct LDL: 102.3 mg/dL

## 2011-04-05 LAB — BASIC METABOLIC PANEL
CO2: 31 mEq/L (ref 19–32)
Chloride: 96 mEq/L (ref 96–112)
Creatinine, Ser: 0.4 mg/dL (ref 0.4–1.2)
Potassium: 4.6 mEq/L (ref 3.5–5.1)

## 2011-04-05 LAB — CBC WITH DIFFERENTIAL/PLATELET
Eosinophils Relative: 1.7 % (ref 0.0–5.0)
HCT: 38.3 % (ref 36.0–46.0)
Hemoglobin: 12.9 g/dL (ref 12.0–15.0)
Lymphocytes Relative: 29.8 % (ref 12.0–46.0)
Lymphs Abs: 2.9 10*3/uL (ref 0.7–4.0)
Monocytes Relative: 9.3 % (ref 3.0–12.0)
Neutro Abs: 5.7 10*3/uL (ref 1.4–7.7)
WBC: 9.7 10*3/uL (ref 4.5–10.5)

## 2011-04-05 LAB — URINALYSIS, ROUTINE W REFLEX MICROSCOPIC
Bilirubin Urine: NEGATIVE
Nitrite: NEGATIVE
Specific Gravity, Urine: 1.01 (ref 1.000–1.030)
Total Protein, Urine: NEGATIVE
pH: 6 (ref 5.0–8.0)

## 2011-04-05 LAB — TSH: TSH: 4.15 u[IU]/mL (ref 0.35–5.50)

## 2011-04-08 ENCOUNTER — Encounter: Payer: Self-pay | Admitting: Family

## 2011-04-12 ENCOUNTER — Encounter: Payer: Medicare FFS | Admitting: Internal Medicine

## 2011-04-20 ENCOUNTER — Ambulatory Visit: Payer: Medicare FFS | Admitting: Pulmonary Disease

## 2011-04-23 ENCOUNTER — Ambulatory Visit: Payer: Medicare FFS | Admitting: Internal Medicine

## 2011-04-29 HISTORY — PX: EYE SURGERY: SHX253

## 2011-04-30 ENCOUNTER — Encounter: Payer: Medicare FFS | Admitting: Internal Medicine

## 2011-05-01 ENCOUNTER — Encounter: Payer: Self-pay | Admitting: Pulmonary Disease

## 2011-05-01 ENCOUNTER — Ambulatory Visit (INDEPENDENT_AMBULATORY_CARE_PROVIDER_SITE_OTHER): Payer: Medicare FFS | Admitting: Pulmonary Disease

## 2011-05-01 VITALS — BP 110/72 | HR 89 | Temp 97.9°F | Ht 61.0 in | Wt 99.5 lb

## 2011-05-01 DIAGNOSIS — J449 Chronic obstructive pulmonary disease, unspecified: Secondary | ICD-10-CM

## 2011-05-01 MED ORDER — TIOTROPIUM BROMIDE MONOHYDRATE 18 MCG IN CAPS: 18.0000 ug | ORAL_CAPSULE | Freq: Every day | RESPIRATORY_TRACT | Status: DC

## 2011-05-01 NOTE — Patient Instructions (Signed)
Spiriva Rx We discussed new copd medication to decrease flares OK to take calcium & vitamin D.

## 2011-05-01 NOTE — Progress Notes (Signed)
  Subjective:    Patient ID: [name], female    DOB: 1941/05/26, 70 y.o.   MRN: [id number]  HPI 56 yowf , heavy ex- smoker, quit 2002 with GOLD stg IV COPD FEV1 26% 6/10 on 24 h O2 since 2009.  -completed pulm rehab oct '11 Recurrent flares in dec '11, feb , mar & may' 12 requiring steroid bursts   03/13/11 Acute OV  Pt presents for a work in visit. Complains of wheezing, increased SOB, sneezing, left ear congestion x1week > reports leaving for [place] in 2 days. She has had a lot of sinus drainage, congestion and nasal stuffiness. Cough and wheezing are getting worse.  OTC not helping. Cough worse at night.   05/01/2011 Better now, discussed roflimulast for frequent flares. Uses rescue MDI 2-3 /day Feels better since she changed residence    Review of Systems Pt denies any significant  nasal congestion or excess secretions, fever, chills, sweats, unintended wt loss, pleuritic or exertional cp, orthopnea pnd or leg swelling.  Pt also denies any obvious fluctuation in symptoms with weather or environmental change or other alleviating or aggravating factors.    Pt denies any increase in rescue therapy over baseline, denies waking up needing it or having early am exacerbations or coughing/wheezing/ or dyspnea      Objective:   Physical Exam Gen. Pleasant, thin woman on O2, in no distress ENT - no lesions, no post nasal drip Neck: No JVD, no thyromegaly, no carotid bruits Lungs: no use of accessory muscles, no dullness to percussion, decreased without rales or rhonchi  Cardiovascular: Rhythm regular, heart sounds  normal, no murmurs or gallops, no peripheral edema Musculoskeletal: No deformities, no cyanosis or clubbing         Assessment & Plan:

## 2011-05-01 NOTE — Assessment & Plan Note (Signed)
FEV1 26% 6/10 on 24 h O2 since 2009. Completed pulm rehab Ct symbicort, spiriva Discussed roflimulast & side efects - will start if hse has another flare this year - she feels change of residence may have helped.

## 2011-05-10 ENCOUNTER — Encounter: Payer: Self-pay | Admitting: Internal Medicine

## 2011-05-10 ENCOUNTER — Ambulatory Visit (INDEPENDENT_AMBULATORY_CARE_PROVIDER_SITE_OTHER): Payer: Medicare FFS | Admitting: Internal Medicine

## 2011-05-10 ENCOUNTER — Other Ambulatory Visit: Payer: Self-pay | Admitting: Internal Medicine

## 2011-05-10 ENCOUNTER — Ambulatory Visit (HOSPITAL_BASED_OUTPATIENT_CLINIC_OR_DEPARTMENT_OTHER)
Admission: RE | Admit: 2011-05-10 | Discharge: 2011-05-10 | Disposition: A | Discharge status: Home / Self Care | Payer: Medicare FFS | Source: Ambulatory Visit | Attending: Internal Medicine | Admitting: Internal Medicine

## 2011-05-10 ENCOUNTER — Ambulatory Visit (INDEPENDENT_AMBULATORY_CARE_PROVIDER_SITE_OTHER)
Admission: RE | Admit: 2011-05-10 | Discharge: 2011-05-10 | Disposition: A | Discharge status: Home / Self Care | Payer: Medicare FFS | Source: Ambulatory Visit | Attending: Internal Medicine | Admitting: Internal Medicine

## 2011-05-10 VITALS — BP 110/80 | HR 84 | Temp 97.9°F | Resp 24 | Ht 60.0 in | Wt 100.0 lb

## 2011-05-10 DIAGNOSIS — Z1231 Encounter for screening mammogram for malignant neoplasm of breast: Secondary | ICD-10-CM

## 2011-05-10 DIAGNOSIS — R05 Cough: Secondary | ICD-10-CM | POA: Insufficient documentation

## 2011-05-10 DIAGNOSIS — M25511 Pain in right shoulder: Secondary | ICD-10-CM

## 2011-05-10 DIAGNOSIS — R059 Cough, unspecified: Secondary | ICD-10-CM | POA: Insufficient documentation

## 2011-05-10 DIAGNOSIS — I10 Essential (primary) hypertension: Secondary | ICD-10-CM

## 2011-05-10 DIAGNOSIS — M25519 Pain in unspecified shoulder: Secondary | ICD-10-CM

## 2011-05-10 DIAGNOSIS — M47812 Spondylosis without myelopathy or radiculopathy, cervical region: Secondary | ICD-10-CM | POA: Insufficient documentation

## 2011-05-10 DIAGNOSIS — Z139 Encounter for screening, unspecified: Secondary | ICD-10-CM

## 2011-05-10 MED ORDER — DICLOFENAC SODIUM 50 MG PO TBEC: DELAYED_RELEASE_TABLET | ORAL | Status: AC

## 2011-05-13 NOTE — Assessment & Plan Note (Signed)
Stop Advil. Begin Voltaren to be taken with food and no other anti-inflammatories. Schedule x-ray of right shoulder. Schedule physical therapy.Followup if no improvement or worsening.

## 2011-05-13 NOTE — Progress Notes (Signed)
  Subjective:    Patient ID: [name], female    DOB: 1941/02/03, 70 y.o.   MRN: [id number]  HPI Patient presents to clinic for evaluation of Multiple medical problems. Notes chronic right shoulder pain and popping sensation worse with abduction. Has been ongoing since December. No injury or trauma. Pain is progressive. Taking pensaid and Advil without significant improvement. No other alleviating or exacerbating factors. Blood pressure reviewed under good control. COPD stable without recent exacerbation or flare including shortness of breath or wheezing. Does use home O2 and is also followed by pulmonary. No other complaints.  Reviewed past medical history, medications and allergies    Review of Systems see history of present illness    Objective:   Physical Exam    Physical Exam  Vitals reviewed. Constitutional:  appears well-developed and well-nourished. No distress.  HENT: using nasal cannula Head: Normocephalic and atraumatic.  Nose: Nose normal.  Mouth/Throat: Oropharynx is clear and moist. No oropharyngeal exudate.  Eyes: Conjunctivae and EOM are normal. Pupils are equal, round, and reactive to light. Right eye exhibits no discharge. Left eye exhibits no discharge. No scleral icterus.  Neck: Neck supple. No thyromegaly present.  Cardiovascular: Normal rate, regular rhythm and normal heart sounds.  Exam reveals no gallop and no friction rub.   No murmur heard. Pulmonary/Chest: Effort normal and breath sounds normal. No respiratory distress.  has no wheezes.  has no rales.  Lymphadenopathy:   no cervical adenopathy.  Neurological:  is alert.  Skin: Skin is warm and dry.  not diaphoretic.  Psychiatric: normal mood and affect.  Musculoskeletal: Mild crepitus of right shoulder. No erythema warmth or effusion. Nontender. Pain reproduced with abduction.     Assessment & Plan:

## 2011-05-13 NOTE — Assessment & Plan Note (Signed)
Normotensive and stable.

## 2011-05-14 ENCOUNTER — Ambulatory Visit: Payer: Medicare FFS | Admitting: Internal Medicine

## 2011-05-15 ENCOUNTER — Telehealth: Payer: Self-pay | Admitting: *Deleted

## 2011-05-15 NOTE — Telephone Encounter (Signed)
Received message from Lynnell Catalan requesting [place] to fax them a signed PT referral. Referral printed and forwarded to Provider for signature. Please fax form to [phone].

## 2011-05-15 NOTE — Telephone Encounter (Signed)
Spoke to [place] at [facility] intake center re: need to cancel PT referral. She states they have already closed out the referral as pt is not home bound.

## 2011-05-16 ENCOUNTER — Encounter: Payer: Medicare FFS | Admitting: Internal Medicine

## 2011-05-18 ENCOUNTER — Ambulatory Visit: Payer: Medicare FFS | Admitting: Pulmonary Disease

## 2011-05-21 ENCOUNTER — Telehealth: Payer: Self-pay | Admitting: *Deleted

## 2011-05-21 NOTE — Telephone Encounter (Signed)
Pt called requesting status of PT referral. Notified pt per Myriam Jacobson that Rehab received our referral and pt is next on their contact list. They will call pt with appt date/time.

## 2011-05-29 ENCOUNTER — Ambulatory Visit: Payer: Medicare FFS | Admitting: Physical Therapy

## 2011-07-20 LAB — CBC
HCT: 33.3 — ABNORMAL LOW
HCT: 42.1
Hemoglobin: 11.4 — ABNORMAL LOW
MCHC: 34.3
Platelets: 463 — ABNORMAL HIGH
RBC: 3.71 — ABNORMAL LOW
RDW: 13.4
WBC: 30.4 — ABNORMAL HIGH

## 2011-07-20 LAB — HEPATIC FUNCTION PANEL
ALT: 26
AST: 31
Bilirubin, Direct: <0.1
Total Bilirubin: 0.4

## 2011-07-20 LAB — CLOSTRIDIUM DIFFICILE EIA

## 2011-07-20 LAB — BLOOD GAS, ARTERIAL
Drawn by: 23588
O2 Content: 2
pCO2 arterial: 51.6 — ABNORMAL HIGH
pH, Arterial: 7.384
pO2, Arterial: 108 — ABNORMAL HIGH

## 2011-07-20 LAB — COMPREHENSIVE METABOLIC PANEL
ALT: 28
AST: 28
Albumin: 3.4 — ABNORMAL LOW
Alkaline Phosphatase: 94
Chloride: 104
GFR calc Af Amer: >60
Potassium: 4.7
Sodium: 142
Total Bilirubin: 1.2
Total Protein: 6

## 2011-07-20 LAB — DIFFERENTIAL
Basophils Absolute: 0.3 — ABNORMAL HIGH
Basophils Relative: 1
Eosinophils Absolute: 0
Eosinophils Relative: 3
Lymphocytes Relative: 13
Lymphocytes Relative: 3 — ABNORMAL LOW
Lymphs Abs: 0.9
Monocytes Absolute: 0.8
Monocytes Absolute: 1.2 — ABNORMAL HIGH
Monocytes Relative: 7
Neutro Abs: 28 — ABNORMAL HIGH
Neutro Abs: 9 — ABNORMAL HIGH

## 2011-07-20 LAB — URINALYSIS, ROUTINE W REFLEX MICROSCOPIC
Bilirubin Urine: NEGATIVE
Glucose, UA: NEGATIVE
Glucose, UA: NEGATIVE
Hgb urine dipstick: NEGATIVE
Ketones, ur: NEGATIVE
Ketones, ur: NEGATIVE
Nitrite: NEGATIVE
Protein, ur: NEGATIVE
Protein, ur: NEGATIVE

## 2011-07-20 LAB — BASIC METABOLIC PANEL
CO2: 33 — ABNORMAL HIGH
Calcium: 8.7
GFR calc Af Amer: >60
GFR calc non Af Amer: >60
Potassium: 4.4
Sodium: 141

## 2011-07-20 LAB — CULTURE, BLOOD (ROUTINE X 2)

## 2011-07-20 LAB — URINE MICROSCOPIC-ADD ON

## 2011-07-23 LAB — URINALYSIS, ROUTINE W REFLEX MICROSCOPIC
Nitrite: NEGATIVE
Protein, ur: NEGATIVE
Specific Gravity, Urine: 1.019
Urobilinogen, UA: 0.2

## 2011-07-23 LAB — DIFFERENTIAL
Basophils Absolute: 0
Eosinophils Absolute: 0.3
Lymphocytes Relative: 7 — ABNORMAL LOW
Lymphs Abs: 1.5
Monocytes Absolute: 1.2 — ABNORMAL HIGH
Monocytes Relative: 9
Neutro Abs: 6.4
Neutro Abs: 19.6 — ABNORMAL HIGH
Neutrophils Relative %: 72
Neutrophils Relative %: 87 — ABNORMAL HIGH

## 2011-07-23 LAB — COMPREHENSIVE METABOLIC PANEL
ALT: 26
AST: 36
Albumin: 3.1 — ABNORMAL LOW
Albumin: 4.2
Alkaline Phosphatase: 54
BUN: 16
BUN: 21
BUN: 3 — ABNORMAL LOW
CO2: 32
Calcium: 8.7
Chloride: 104
Creatinine, Ser: 0.54
Creatinine, Ser: 0.55
GFR calc Af Amer: >60
GFR calc non Af Amer: >60
GFR calc non Af Amer: >60
GFR calc non Af Amer: >60
Glucose, Bld: 116 — ABNORMAL HIGH
Glucose, Bld: 153 — ABNORMAL HIGH
Potassium: 3.7
Sodium: 143
Total Bilirubin: 0.5
Total Bilirubin: 0.6
Total Bilirubin: 0.8
Total Protein: 5.3 — ABNORMAL LOW

## 2011-07-23 LAB — CBC
HCT: 29.4 — ABNORMAL LOW
HCT: 33.9 — ABNORMAL LOW
HCT: 36.4
HCT: 44.4
Hemoglobin: 11.7 — ABNORMAL LOW
MCHC: 34.5
MCHC: 34.6
MCV: 88.6
MCV: 89.3
MCV: 87.1
MCV: 87.7
Platelets: 341
Platelets: 367
Platelets: 415 — ABNORMAL HIGH
RBC: 3.32 — ABNORMAL LOW
RBC: 3.39 — ABNORMAL LOW
RBC: 3.86 — ABNORMAL LOW
RBC: 4.14
RDW: 14.3
RDW: 14.4
WBC: 6.1
WBC: 9
WBC: 22.6 — ABNORMAL HIGH
WBC: 8.1

## 2011-07-23 LAB — CREATININE, SERUM
Creatinine, Ser: 0.54
GFR calc Af Amer: >60
GFR calc non Af Amer: >60

## 2011-07-23 LAB — BASIC METABOLIC PANEL
Calcium: 7.8 — ABNORMAL LOW
GFR calc Af Amer: >60
GFR calc non Af Amer: >60
Sodium: 142

## 2011-07-23 LAB — URINE MICROSCOPIC-ADD ON

## 2011-07-23 LAB — OCCULT BLOOD X 1 CARD TO LAB, STOOL
Fecal Occult Bld: POSITIVE
Fecal Occult Bld: POSITIVE

## 2011-07-23 LAB — POTASSIUM: Potassium: 3.2 — ABNORMAL LOW

## 2011-07-23 LAB — MAGNESIUM: Magnesium: 1.7

## 2011-07-23 LAB — LIPASE, BLOOD: Lipase: 22

## 2011-07-24 ENCOUNTER — Ambulatory Visit (INDEPENDENT_AMBULATORY_CARE_PROVIDER_SITE_OTHER): Payer: Medicare FFS | Admitting: Adult Health

## 2011-07-24 ENCOUNTER — Encounter: Payer: Self-pay | Admitting: Adult Health

## 2011-07-24 VITALS — BP 98/66 | HR 103 | Temp 98.1°F | Wt 103.0 lb

## 2011-07-24 DIAGNOSIS — J4489 Other specified chronic obstructive pulmonary disease: Secondary | ICD-10-CM

## 2011-07-24 DIAGNOSIS — J449 Chronic obstructive pulmonary disease, unspecified: Secondary | ICD-10-CM

## 2011-07-24 MED ORDER — DOXYCYCLINE HYCLATE 100 MG PO TABS: 100.0000 mg | ORAL_TABLET | Freq: Two times a day (BID) | ORAL | Status: AC

## 2011-07-24 MED ORDER — BUDESONIDE-FORMOTEROL FUMARATE 160-4.5 MCG/ACT IN AERO: 2.0000 | INHALATION_SPRAY | Freq: Two times a day (BID) | RESPIRATORY_TRACT | Status: DC

## 2011-07-24 MED ORDER — PREDNISONE 10 MG PO TABS: ORAL_TABLET | ORAL | Status: AC

## 2011-07-24 MED ORDER — OMEPRAZOLE-SODIUM BICARBONATE 40-1100 MG PO CAPS: 1.0000 | ORAL_CAPSULE | Freq: Two times a day (BID) | ORAL | Status: DC

## 2011-07-24 NOTE — Progress Notes (Signed)
  Subjective:    Patient ID: [name], female    DOB: Apr 13, 1941, 70 y.o.   MRN: [id number]  HPI  67 yowf , heavy ex- smoker, quit 2002 with GOLD stg IV COPD FEV1 26% 6/10 on 24 h O2 since 2009.  -completed pulm rehab oct '11 Recurrent flares in dec '11, feb , mar & may' 12 requiring steroid bursts   03/13/11 Acute OV  Pt presents for a work in visit. Complains of wheezing, increased SOB, sneezing, left ear congestion x1week > reports leaving for [place] in 2 days. She has had a lot of sinus drainage, congestion and nasal stuffiness. Cough and wheezing are getting worse.  OTC not helping. Cough worse at night.   7/3//2012 Better now, discussed roflimulast for frequent flares. Uses rescue MDI 2-3 /day Feels better since she changed residence >no changes   07/24/2011 Acute OV  Presents for an acute office visit. Complains of c/o sore throat, lethargic, SOB with exertion, dry cough for last few days. Does not want symptoms to get out of control. Decreased Activity tolerance with increased DOE. No energy. Mild wheeze starting.  Some drainage in throat.  No otc used yet.   Review of Systems  Constitutional:   No  weight loss, night sweats,  Fevers, chills, fatigue, or  lassitude.  HEENT:   No headaches,  Difficulty swallowing,  Tooth/dental problems, or  Sore throat,                No sneezing, itching, ear ache,  +nasal congestion, post nasal drip,   CV:  No chest pain,  Orthopnea, PND, swelling in lower extremities, anasarca, dizziness, palpitations, syncope.   GI  No heartburn, indigestion, abdominal pain, nausea, vomiting, diarrhea, change in bowel habits, loss of appetite, bloody stools.   Resp:  No coughing up of blood.  No change in color of mucus.   No chest wall deformity  Skin: no rash or lesions.  GU: no dysuria, change in color of urine, no urgency or frequency.  No flank pain, no hematuria   MS:  No joint pain or swelling.  No decreased range of motion.  No back  pain.  Psych:  No change in mood or affect. No depression or anxiety.  No memory loss.          Objective:   Physical Exam  Gen. Pleasant, thin woman on O2, in no distress ENT - no lesions, no post nasal drip Neck: No JVD, no thyromegaly, no carotid bruits Lungs: no use of accessory muscles, no dullness to percussion, decreased BS in bases, coarse BS  Cardiovascular: Rhythm regular, heart sounds  normal, no murmurs or gallops, no peripheral edema Musculoskeletal: No deformities, no cyanosis or clubbing         Assessment & Plan:

## 2011-07-24 NOTE — Patient Instructions (Signed)
Doxycycline to have on hold if symptoms worsen with discolored mucus.  Mucinex DM Twice daily  As needed  .cough/congestion Allergra 180mg  daily ,As needed  Drainage  Fluids and rest.  Prednisone taper over next week.  Please contact office for sooner follow up if symptoms do not improve or worsen or seek emergency care  follow up Dr. Vassie Loll  In 2 months  and As needed

## 2011-07-24 NOTE — Assessment & Plan Note (Signed)
Mild flare of COPD   Plan:  Doxycycline to have on hold if symptoms worsen with discolored mucus.  Mucinex DM Twice daily  As needed  .cough/congestion Allergra 180mg  daily ,As needed  Drainage  Fluids and rest.  Prednisone taper over next week.  Please contact office for sooner follow up if symptoms do not improve or worsen or seek emergency care  follow up Dr. Vassie Loll  In 2 months  and As needed

## 2011-07-27 LAB — HEPATIC FUNCTION PANEL
AST: 44 — ABNORMAL HIGH
Albumin: 3.2 — ABNORMAL LOW
Alkaline Phosphatase: 130 — ABNORMAL HIGH
Bilirubin, Direct: 0.2
Total Bilirubin: 1

## 2011-07-27 LAB — CBC
HCT: 41.7
Hemoglobin: 14.1
MCHC: 33.9
RBC: 4.65

## 2011-07-27 LAB — CARDIAC PANEL(CRET KIN+CKTOT+MB+TROPI)
Relative Index: INVALID
Total CK: 25
Total CK: 29
Troponin I: 0.02

## 2011-07-27 LAB — BASIC METABOLIC PANEL
BUN: 18
CO2: 36 — ABNORMAL HIGH
Calcium: 8.8
Creatinine, Ser: 0.8
GFR calc non Af Amer: >60
Glucose, Bld: 103 — ABNORMAL HIGH
Sodium: 142

## 2011-07-27 LAB — DIFFERENTIAL
Basophils Relative: 1
Eosinophils Relative: 1
Monocytes Absolute: 0.9
Monocytes Relative: 6
Neutro Abs: 12.1 — ABNORMAL HIGH

## 2011-07-31 DIAGNOSIS — Z23 Encounter for immunization: Secondary | ICD-10-CM

## 2011-08-01 LAB — CARDIAC PANEL(CRET KIN+CKTOT+MB+TROPI)
CK, MB: 3
CK, MB: 3.7
Relative Index: INVALID
Total CK: 46
Troponin I: <0.01

## 2011-08-01 LAB — COMPREHENSIVE METABOLIC PANEL
ALT: 23
AST: 27
AST: 38 — ABNORMAL HIGH
Albumin: 3.5
Alkaline Phosphatase: 76
Alkaline Phosphatase: 87
BUN: 16
CO2: 31
Calcium: 9.2
GFR calc Af Amer: >60
GFR calc Af Amer: >60
Glucose, Bld: 86
Potassium: 3.8
Potassium: 4
Sodium: 144
Total Protein: 6.4
Total Protein: 7.3

## 2011-08-01 LAB — BASIC METABOLIC PANEL
BUN: 23
Creatinine, Ser: 0.57
GFR calc Af Amer: >60
GFR calc non Af Amer: >60
Potassium: 4.1

## 2011-08-01 LAB — DIFFERENTIAL
Eosinophils Absolute: 0.1
Eosinophils Relative: 1
Lymphocytes Relative: 25
Lymphs Abs: 2.4
Monocytes Absolute: 0.7
Monocytes Relative: 7

## 2011-08-01 LAB — POCT B-TYPE NATRIURETIC PEPTIDE (BNP): B Natriuretic Peptide, POC: 15.5

## 2011-08-01 LAB — LIPID PANEL
LDL Cholesterol: 106 — ABNORMAL HIGH
Total CHOL/HDL Ratio: 2.9
Triglycerides: 48
VLDL: 10

## 2011-08-01 LAB — CBC
HCT: 38.5
HCT: 40.6
Hemoglobin: 14.1
MCV: 89.4
Platelets: 321
Platelets: 348
RDW: 14
WBC: 4.9
WBC: 6.2
WBC: 9.7

## 2011-08-01 LAB — POCT CARDIAC MARKERS: Troponin i, poc: <0.05

## 2011-08-02 ENCOUNTER — Ambulatory Visit (INDEPENDENT_AMBULATORY_CARE_PROVIDER_SITE_OTHER): Payer: Medicare FFS

## 2011-08-02 DIAGNOSIS — Z23 Encounter for immunization: Secondary | ICD-10-CM

## 2011-08-03 LAB — COMPREHENSIVE METABOLIC PANEL
Alkaline Phosphatase: 77 U/L (ref 39–117)
BUN: 18 mg/dL (ref 6–23)
CO2: 32 mEq/L (ref 19–32)
Chloride: 103 mEq/L (ref 96–112)
GFR calc non Af Amer: >60 mL/min (ref >60)
Glucose, Bld: 90 mg/dL (ref 70–99)
Potassium: 4.7 mEq/L (ref 3.5–5.1)
Total Bilirubin: 0.4 mg/dL (ref 0.3–1.2)

## 2011-08-03 LAB — DIFFERENTIAL
Basophils Absolute: 0.1 10*3/uL (ref 0.0–0.1)
Basophils Relative: 2 % — ABNORMAL HIGH (ref 0–1)
Eosinophils Absolute: 0.1 K/uL (ref 0.0–0.7)
Eosinophils Relative: 2 % (ref 0–5)
Lymphocytes Relative: 19 % (ref 12–46)
Lymphs Abs: 1.5 K/uL (ref 0.7–4.0)
Monocytes Absolute: 0.6 10*3/uL (ref 0.1–1.0)
Monocytes Relative: 8 % (ref 3–12)
Neutro Abs: 5.4 10*3/uL (ref 1.7–7.7)
Neutrophils Relative %: 70 % (ref 43–77)

## 2011-08-03 LAB — CBC
HCT: 36.5 % (ref 36.0–46.0)
Hemoglobin: 11.8 g/dL — ABNORMAL LOW (ref 12.0–15.0)
MCHC: 32.4 g/dL (ref 30.0–36.0)
MCV: 89.1 fL (ref 78.0–100.0)
Platelets: 310 K/uL (ref 150–400)
RBC: 4.09 MIL/uL (ref 3.87–5.11)
RDW: 13.8 % (ref 11.5–15.5)
WBC: 7.7 10*3/uL (ref 4.0–10.5)

## 2011-08-03 LAB — COMPREHENSIVE METABOLIC PANEL WITH GFR
ALT: 27 U/L (ref 0–35)
AST: 42 U/L — ABNORMAL HIGH (ref 0–37)
Albumin: 3.9 g/dL (ref 3.5–5.2)
Calcium: 8.6 mg/dL (ref 8.4–10.5)
Creatinine, Ser: 0.5 mg/dL (ref 0.4–1.2)
GFR calc Af Amer: >60 mL/min (ref >60)
Sodium: 140 meq/L (ref 135–145)
Total Protein: 6.3 g/dL (ref 6.0–8.3)

## 2011-08-16 ENCOUNTER — Encounter: Payer: Self-pay | Admitting: Critical Care Medicine

## 2011-08-16 ENCOUNTER — Ambulatory Visit (INDEPENDENT_AMBULATORY_CARE_PROVIDER_SITE_OTHER): Payer: Medicare FFS | Admitting: Critical Care Medicine

## 2011-08-16 VITALS — BP 94/54 | HR 89 | Temp 97.4°F | Ht 60.0 in | Wt 102.0 lb

## 2011-08-16 DIAGNOSIS — J209 Acute bronchitis, unspecified: Secondary | ICD-10-CM

## 2011-08-16 DIAGNOSIS — J449 Chronic obstructive pulmonary disease, unspecified: Secondary | ICD-10-CM

## 2011-08-16 DIAGNOSIS — J019 Acute sinusitis, unspecified: Secondary | ICD-10-CM

## 2011-08-16 MED ORDER — AZITHROMYCIN 250 MG PO TABS: ORAL_TABLET | ORAL | Status: DC

## 2011-08-16 MED ORDER — FLUTICASONE FUROATE 27.5 MCG/SPRAY NA SUSP: 2.0000 | Freq: Every day | NASAL | Status: DC

## 2011-08-16 MED ORDER — PREDNISONE 10 MG PO TABS: ORAL_TABLET | ORAL | Status: DC

## 2011-08-16 NOTE — Assessment & Plan Note (Addendum)
Acute copd exacerbation with tracheobronchitis Plan Prednisone 10mg  Take 4 for two days three for two days two for two days one for two days Azithromycin 250mg  Take two once then one daily until gone USe veramyst sample until gone two sprays each nostril daily Rinse sinuses out with saline nasal spray available over the counter Return 3 weeks with Vassie Loll

## 2011-08-16 NOTE — Patient Instructions (Signed)
Prednisone 10mg  Take 4 for two days three for two days two for two days one for two days Azithromycin 250mg  Take two once then one daily until gone USe veramyst sample until gone two sprays each nostril daily Rinse sinuses out with saline nasal spray available over the counter Your meds were sent downstairs to [facility] Return 3 weeks with Dr Vassie Loll for recheck [facility]

## 2011-08-16 NOTE — Progress Notes (Signed)
Subjective:    Patient ID: [name], female    DOB: 07-Jun-1941, 70 y.o.   MRN: [id number]  HPI  70 yowf , heavy ex- smoker, quit 2002 with GOLD stg IV COPD FEV1 26% 6/10 on 24 h O2 since 2009.  -completed pulm rehab oct '11 Recurrent flares in dec '11, feb , mar & may' 12 requiring steroid bursts  7/3//2012 Better now, discussed roflimulast for frequent flares. Uses rescue MDI 2-3 /day Feels better since she changed residence >no changes   07/24/11 Acute OV  Presents for an acute office visit. Complains of c/o sore throat, lethargic, SOB with exertion, dry cough for last few days. Does not want symptoms to get out of control. Decreased Activity tolerance with increased DOE. No energy. Mild wheeze starting.  Some drainage in throat.  No otc used yet.   08/16/2011 Was seen by NP 9/25 and rx : Doxycycline to have on hold if symptoms worsen with discolored mucus.  Mucinex DM Twice daily As needed .cough/congestion  Allergra 180mg  daily ,As needed Drainage  Fluids and rest.  Prednisone taper over next week.  She did fill and take doxy.  She did improve but then 5 days ago, did a lot of house work, vacuumed and over did it. Also worked in the yard and got worse.  Husband just in hospital and under stress.  Now with chills and sweats. Now more dyspneic.  No real fever.  Notes dry cough.  No real edema in feet.  No more heartburn than usual.   Review of Systems  Constitutional:   No  weight loss, night sweats,  Fevers, chills, fatigue, or  lassitude.  HEENT:   No headaches,  Difficulty swallowing,  Tooth/dental problems, or  Sore throat,                No sneezing, itching, ear ache,  +nasal congestion, post nasal drip,   CV:  No chest pain,  Orthopnea, PND, swelling in lower extremities, anasarca, dizziness, palpitations, syncope.   GI  No heartburn, indigestion, abdominal pain, nausea, vomiting, diarrhea, change in bowel habits, loss of appetite, bloody stools.   Resp:   No coughing up of blood.  Notes  change in color of mucus.   No chest wall deformity  Skin: no rash or lesions.  GU: no dysuria, change in color of urine, no urgency or frequency.  No flank pain, no hematuria   MS:  No joint pain or swelling.  No decreased range of motion.  No back pain.  Psych:  No change in mood or affect. No depression or anxiety.  No memory loss.          Objective:   Physical Exam  Filed Vitals:   08/16/11 1229  BP: 94/54  Pulse: 89  Temp: 97.4 F (36.3 C)  TempSrc: Oral  Height: 5' (1.524 m)  Weight: 102 lb (46.267 kg)  SpO2: 100%    Gen: Pleasant, thin WF on O2, in no distress,  normal affect  ENT: No lesions,  mouth clear,  oropharynx clear, no postnasal drip  Neck: No JVD, no TMG, no carotid bruits  Lungs: No use of accessory muscles, no dullness to percussion, distant BS, exp wheezes  Cardiovascular: RRR, heart sounds normal, no murmur or gallops, no peripheral edema  Abdomen: soft and NT, no HSM,  BS normal  Musculoskeletal: No deformities, no cyanosis or clubbing  Neuro: alert, non focal  Skin: Warm, no lesions or rashes  Assessment & Plan:   COPD Acute copd exacerbation with tracheobronchitis Plan Prednisone 10mg  Take 4 for two days three for two days two for two days one for two days Azithromycin 250mg  Take two once then one daily until gone USe veramyst sample until gone two sprays each nostril daily Rinse sinuses out with saline nasal spray available over the counter Return 3 weeks with Vassie Loll    Updated Medication List Outpatient Encounter Prescriptions as of 08/16/2011  Medication Sig Dispense Refill  . albuterol (PROVENTIL HFA) 108 (90 BASE) MCG/ACT inhaler Inhale 2 puffs into the lungs every 6 (six) hours as needed for wheezing. Inhale 1-2 puffs every 4-6 hours as needed for wheezing.  3 Inhaler  3  . albuterol (PROVENTIL) (2.5 MG/3ML) 0.083% nebulizer solution Take 2.5 mg by nebulization. Every 4 -6 hours as  needed.       . ALPRAZolam (XANAX) 0.5 MG tablet 0.5 mg. Take 1 tablet by mouth every morning and take 2 tablets by mouth at bedtime.       . budesonide-formoterol (SYMBICORT) 160-4.5 MCG/ACT inhaler Inhale 2 puffs into the lungs 2 (two) times daily.  3 Inhaler  3  . Calcium Carbonate-Vitamin D 600-400 MG-UNIT per tablet Take 1 tablet by mouth 2 (two) times daily.        . Coenzyme Q10 ([employer] COENZYME Q10) 100 MG capsule Take 100 mg by mouth daily.        Marland Kitchen dextromethorphan-guaifenesin (MUCINEX DM) 30-600 MG per 12 hr tablet Take 1 tablet by mouth every 12 (twelve) hours.        Marland Kitchen ibuprofen (ADVIL,MOTRIN) 200 MG tablet Take 200 mg by mouth. Take 3 tablets at bedtime prn.      . mirtazapine (REMERON) 15 MG tablet Take 15 mg by mouth at bedtime.        Marland Kitchen omeprazole-sodium bicarbonate (ZEGERID) 40-1100 MG per capsule Take 1 capsule by mouth 2 (two) times daily.  180 capsule  3  . tiotropium (SPIRIVA) 18 MCG inhalation capsule Place 1 capsule (18 mcg total) into inhaler and inhale daily.  30 capsule  0  . azithromycin (ZITHROMAX) 250 MG tablet Take two once then one daily until gone  6 each  0  . diclofenac sodium (VOLTAREN) 1 % GEL Apply 1 application topically 3 (three) times daily.  2 Tube  1  . fluticasone (VERAMYST) 27.5 MCG/SPRAY nasal spray Place 2 sprays into the nose daily.  10 g  0  . predniSONE (DELTASONE) 10 MG tablet Take 4 for two days three for two days two for two days one for two days  20 tablet  0

## 2011-09-11 ENCOUNTER — Ambulatory Visit: Payer: Medicare FFS | Admitting: Pulmonary Disease

## 2011-12-19 ENCOUNTER — Ambulatory Visit (INDEPENDENT_AMBULATORY_CARE_PROVIDER_SITE_OTHER): Payer: Medicare FFS | Admitting: Pulmonary Disease

## 2011-12-19 ENCOUNTER — Encounter: Payer: Self-pay | Admitting: Pulmonary Disease

## 2011-12-19 ENCOUNTER — Ambulatory Visit (INDEPENDENT_AMBULATORY_CARE_PROVIDER_SITE_OTHER)
Admission: RE | Admit: 2011-12-19 | Discharge: 2011-12-19 | Disposition: A | Discharge status: Home / Self Care | Payer: Medicare FFS | Source: Ambulatory Visit | Attending: Pulmonary Disease | Admitting: Pulmonary Disease

## 2011-12-19 VITALS — BP 94/62 | HR 81 | Temp 97.8°F | Ht 60.0 in | Wt 103.4 lb

## 2011-12-19 DIAGNOSIS — J449 Chronic obstructive pulmonary disease, unspecified: Secondary | ICD-10-CM

## 2011-12-19 MED ORDER — PREDNISONE 10 MG PO TABS: ORAL_TABLET | ORAL | Status: DC

## 2011-12-19 MED ORDER — TIOTROPIUM BROMIDE MONOHYDRATE 18 MCG IN CAPS: 18.0000 ug | ORAL_CAPSULE | Freq: Every day | RESPIRATORY_TRACT | Status: DC

## 2011-12-19 MED ORDER — ALBUTEROL SULFATE HFA 108 (90 BASE) MCG/ACT IN AERS: 2.0000 | INHALATION_SPRAY | Freq: Four times a day (QID) | RESPIRATORY_TRACT | Status: DC | PRN

## 2011-12-19 MED ORDER — BUDESONIDE-FORMOTEROL FUMARATE 160-4.5 MCG/ACT IN AERO: 2.0000 | INHALATION_SPRAY | Freq: Two times a day (BID) | RESPIRATORY_TRACT | Status: DC

## 2011-12-19 NOTE — Patient Instructions (Signed)
Refills  Chest xray today Prednisone 20 mg x 1 week, then 10 mg x  1 week, then STOP Use albuterol inhaler as needed only - this can cause tremors

## 2011-12-19 NOTE — Assessment & Plan Note (Addendum)
No obvious cause for worsening dyspnea apparent Refills given Chest xray wnl Prednisone 20 mg x 1 week, then 10 mg x  1 week, then STOP Use albuterol inhaler as needed only - this can cause tremors

## 2011-12-19 NOTE — Progress Notes (Signed)
  Subjective:    Patient ID: [name], female    DOB: 1941/06/05, 71 y.o.   MRN: [id number]  HPI 80 yowf , heavy ex- smoker, quit 2002 with GOLD stg IV COPD FEV1 26% 6/10 on 24 h O2 since 2009.  -completed pulm rehab oct '11  Recurrent flares in dec '11, feb , mar, may', sep12 requiring steroid bursts    12/19/2011 c/o increased SOB with any exertion -needs Medication Refill Wt stable No real fever. Notes dry cough. No real edema in feet. No more heartburn than usual C/o tremors CXR - hyperinflation   Review of Systems Patient denies significant cough, hemoptysis,  chest pain, palpitations, pedal edema, orthopnea, paroxysmal nocturnal dyspnea, lightheadedness, nausea, vomiting, abdominal or  leg pains      Objective:   Physical Exam  Gen. Pleasant, well-nourished, in no distress ENT - no lesions, no post nasal drip Neck: No JVD, no thyromegaly, no carotid bruits Lungs: no use of accessory muscles, no dullness to percussion, decerased BL without rales or rhonchi  Cardiovascular: Rhythm regular, heart sounds  normal, no murmurs or gallops, no peripheral edema Musculoskeletal: No deformities, no cyanosis or clubbing        Assessment & Plan:

## 2012-01-21 ENCOUNTER — Telehealth: Payer: Self-pay | Admitting: Pulmonary Disease

## 2012-01-21 NOTE — Telephone Encounter (Signed)
lmomtcb x1 for cindy--RA has this form in his look at

## 2012-01-21 NOTE — Telephone Encounter (Signed)
Per Mindy's request, advised Arline Asp that RA has the form & once it has been filled out we will get it back to her.  Antionette Fairy

## 2012-01-21 NOTE — Telephone Encounter (Signed)
Please advise Dr. Vassie Loll the clearance form for pt surgery is in your look at, thanks

## 2012-01-22 NOTE — Telephone Encounter (Signed)
Given to sarah to faax back Cleared with monitoring during procedure.

## 2012-01-22 NOTE — Telephone Encounter (Signed)
LMOMTCB x 1 for [name].

## 2012-01-23 NOTE — Telephone Encounter (Signed)
lmomtcb  

## 2012-01-24 NOTE — Telephone Encounter (Signed)
Form faxed on 01-22-12, I verified this was received by [facility]. Carron Curie, CMA

## 2012-02-05 ENCOUNTER — Encounter: Payer: Self-pay | Admitting: Pulmonary Disease

## 2012-02-05 ENCOUNTER — Ambulatory Visit (INDEPENDENT_AMBULATORY_CARE_PROVIDER_SITE_OTHER): Payer: Medicare FFS | Admitting: Pulmonary Disease

## 2012-02-05 VITALS — BP 138/86 | HR 87 | Temp 98.1°F | Ht 61.0 in | Wt 101.0 lb

## 2012-02-05 DIAGNOSIS — J449 Chronic obstructive pulmonary disease, unspecified: Secondary | ICD-10-CM

## 2012-02-05 NOTE — Progress Notes (Signed)
  Subjective:    Patient ID: [name], female    DOB: 01-Mar-1941, 71 y.o.   MRN: [id number]  HPI 71 yowf , heavy ex- smoker, quit 2002 with GOLD stg IV COPD FEV1 26% 6/10 on 24 h O2 since 2009.  -completed pulm rehab oct '11  Recurrent flares in dec '11, feb , mar, may', sep12 requiring steroid bursts   12/19/2011 - pred taper for dyspnea   02/05/2012 Planning cosmetic surgical procedure - clearance given Patient states breathing is about the same as last visit. c/o sob and chest tightness once x 1 day ago.  Her sister passed away 2 weeks ago from copd Denies cough, wheezing, and chest pain.   Review of Systems Patient denies significant dyspnea,cough, hemoptysis,  chest pain, palpitations, pedal edema, orthopnea, paroxysmal nocturnal dyspnea, lightheadedness, nausea, vomiting, abdominal or  leg pains      Objective:   Physical Exam  Gen. Pleasant, well-nourished, in no distress ENT - no lesions, no post nasal drip Neck: No JVD, no thyromegaly, no carotid bruits Lungs: no use of accessory muscles, no dullness to percussion, decreased BS without rales or rhonchi  Cardiovascular: Rhythm regular, heart sounds  normal, no murmurs or gallops, no peripheral edema Musculoskeletal: No deformities, no cyanosis or clubbing         Assessment & Plan:

## 2012-02-05 NOTE — Patient Instructions (Signed)
Call us as you need [place]

## 2012-02-05 NOTE — Assessment & Plan Note (Signed)
Cleared for cosmetic  Surgery under local anesthesia - if conscious sedation used, then would need cardiopulmonary monitoring for arrythmias & pulse oximetry. Ct symbicort & spiriva Discussed roflimulast - but will not start for concern of wt loss.

## 2012-04-15 ENCOUNTER — Other Ambulatory Visit: Payer: Self-pay | Admitting: Internal Medicine

## 2012-04-15 DIAGNOSIS — Z79899 Other long term (current) drug therapy: Secondary | ICD-10-CM

## 2012-04-15 DIAGNOSIS — I10 Essential (primary) hypertension: Secondary | ICD-10-CM

## 2012-04-15 DIAGNOSIS — Z0000 Encounter for general adult medical examination without abnormal findings: Secondary | ICD-10-CM

## 2012-04-15 DIAGNOSIS — M810 Age-related osteoporosis without current pathological fracture: Secondary | ICD-10-CM

## 2012-04-15 DIAGNOSIS — E039 Hypothyroidism, unspecified: Secondary | ICD-10-CM

## 2012-04-15 LAB — BASIC METABOLIC PANEL
BUN: 23 mg/dL (ref 6–23)
Calcium: 8.9 mg/dL (ref 8.4–10.5)
Creat: 0.46 mg/dL — ABNORMAL LOW (ref 0.50–1.10)
Potassium: 4.4 mEq/L (ref 3.5–5.3)

## 2012-04-15 LAB — CBC
Hemoglobin: 11.6 g/dL — ABNORMAL LOW (ref 12.0–15.0)
MCHC: 32.3 g/dL (ref 30.0–36.0)
RBC: 4.13 MIL/uL (ref 3.87–5.11)

## 2012-04-15 LAB — HEPATIC FUNCTION PANEL
ALT: 15 U/L (ref 0–35)
Alkaline Phosphatase: 82 U/L (ref 39–117)
Total Protein: 6 g/dL (ref 6.0–8.3)

## 2012-04-15 LAB — LIPID PANEL
Cholesterol: 176 mg/dL (ref 0–200)
Triglycerides: 140 mg/dL (ref <150)
VLDL: 28 mg/dL (ref 0–40)

## 2012-04-15 LAB — TSH: TSH: 2.064 u[IU]/mL (ref 0.350–4.500)

## 2012-04-16 LAB — URINALYSIS, ROUTINE W REFLEX MICROSCOPIC
Bilirubin Urine: NEGATIVE
Hgb urine dipstick: NEGATIVE
Ketones, ur: NEGATIVE mg/dL
Nitrite: NEGATIVE
Urobilinogen, UA: 0.2 mg/dL (ref 0.0–1.0)
pH: 7 (ref 5.0–8.0)

## 2012-04-17 ENCOUNTER — Encounter: Payer: Self-pay | Admitting: Internal Medicine

## 2012-04-17 ENCOUNTER — Ambulatory Visit (INDEPENDENT_AMBULATORY_CARE_PROVIDER_SITE_OTHER): Payer: Medicare FFS | Admitting: Internal Medicine

## 2012-04-17 ENCOUNTER — Telehealth: Payer: Self-pay | Admitting: Internal Medicine

## 2012-04-17 VITALS — BP 100/60 | HR 90 | Temp 98.1°F | Resp 20 | Ht 60.0 in | Wt 98.0 lb

## 2012-04-17 DIAGNOSIS — D649 Anemia, unspecified: Secondary | ICD-10-CM

## 2012-04-17 DIAGNOSIS — J449 Chronic obstructive pulmonary disease, unspecified: Secondary | ICD-10-CM

## 2012-04-17 DIAGNOSIS — E039 Hypothyroidism, unspecified: Secondary | ICD-10-CM

## 2012-04-17 DIAGNOSIS — R51 Headache: Secondary | ICD-10-CM

## 2012-04-17 DIAGNOSIS — M810 Age-related osteoporosis without current pathological fracture: Secondary | ICD-10-CM

## 2012-04-17 LAB — VITAMIN B12: Vitamin B-12: 455 pg/mL (ref 211–911)

## 2012-04-17 MED ORDER — ALBUTEROL SULFATE HFA 108 (90 BASE) MCG/ACT IN AERS: 2.0000 | INHALATION_SPRAY | Freq: Four times a day (QID) | RESPIRATORY_TRACT | Status: DC | PRN

## 2012-04-17 NOTE — Telephone Encounter (Signed)
Please schedule tsh/free t4-hypothyroidism. Patient has visit on 10/20/12. Will be going to [facility] lab.

## 2012-04-17 NOTE — Patient Instructions (Addendum)
We are in the process of scheduling your bone density Please schedule tsh/free t4-hypothyroidism prior to next visit

## 2012-04-19 DIAGNOSIS — D649 Anemia, unspecified: Secondary | ICD-10-CM | POA: Insufficient documentation

## 2012-04-19 DIAGNOSIS — R51 Headache: Secondary | ICD-10-CM | POA: Insufficient documentation

## 2012-04-19 DIAGNOSIS — M810 Age-related osteoporosis without current pathological fracture: Secondary | ICD-10-CM | POA: Insufficient documentation

## 2012-04-19 DIAGNOSIS — R519 Headache, unspecified: Secondary | ICD-10-CM | POA: Insufficient documentation

## 2012-04-19 NOTE — Assessment & Plan Note (Signed)
Schedule bone density. ?resume reclast

## 2012-04-19 NOTE — Progress Notes (Signed)
  Subjective:    Patient ID: [name], female    DOB: 1941-04-13, 71 y.o.   MRN: [id number]  HPI Pt presents to clinic for followup of multiple medical problems. Last bmd >2years ago and states last reclast for osteoporosis 1.5 years ago. Has ha every morning after awakening. +nightmares as well. Reviewed minimal anemia without gross active bleeding.   Past Medical History  Diagnosis Date  . COPD (chronic obstructive pulmonary disease)   . Thyroid disease   . Colitis   . Glucose intolerance (impaired glucose tolerance)   . Polyp of colon, adenomatous   . History of small bowel obstruction   . Arthralgia   . Dyspnea   . Pallor   . Chest pain   . Osteoporosis   . Dysuria   . Weight loss   . Edema   . Weakness   . Anxiety   . Depression   . Migraine   . Hypertension   . Leukocytosis   . GERD (gastroesophageal reflux disease)    Past Surgical History  Procedure Date  . Cholecystectomy   . Abdominal hysterectomy 1993  . Tubal ligation   . Bladder repair     reports that she quit smoking about 10 years ago. Her smoking use included Cigarettes. She has a 80 pack-year smoking history. She has never used smokeless tobacco. She reports that she drinks alcohol. Her drug history not on file. family history includes Alcohol abuse in her mother; COPD in her brother; Cancer in her paternal aunt; Heart disease in her mother; Hypertension in her mother; Kidney disease in her mother; Prostate cancer in an unspecified family member; and Stroke in her mother. Allergies  Allergen Reactions  . Morphine     REACTION: nightmares      Review of Systems see hpi     Objective:   Physical Exam  Nursing note and vitals reviewed. Constitutional: She appears well-developed and well-nourished. No distress.  HENT:  Head: Normocephalic and atraumatic.  Right Ear: Tympanic membrane, external ear and ear canal normal.  Left Ear: Tympanic membrane, external ear and ear canal normal.    Nose: Nose normal.  Mouth/Throat: Oropharynx is clear and moist. No oropharyngeal exudate.  Eyes: Conjunctivae are normal. No scleral icterus.  Neck: Neck supple. No JVD present. Carotid bruit is not present. No thyromegaly present.  Cardiovascular: Normal rate, regular rhythm and normal heart sounds.  Exam reveals no gallop and no friction rub.   No murmur heard. Pulmonary/Chest: Effort normal and breath sounds normal. No respiratory distress. She has no wheezes. She has no rales.  Abdominal: Soft. Bowel sounds are normal. She exhibits no distension and no mass. There is no tenderness. There is no rebound and no guarding.  Lymphadenopathy:    She has no cervical adenopathy.  Neurological: She is alert.  Skin: Skin is warm and dry. She is not diaphoretic.  Psychiatric: She has a normal mood and affect.          Assessment & Plan:

## 2012-04-19 NOTE — Assessment & Plan Note (Signed)
Obtain iron and b12 level

## 2012-04-19 NOTE — Assessment & Plan Note (Signed)
Obtain overnight pulse ox study

## 2012-04-21 ENCOUNTER — Ambulatory Visit
Admission: RE | Admit: 2012-04-21 | Discharge: 2012-04-21 | Disposition: A | Discharge status: Home / Self Care | Payer: Medicare FFS | Source: Ambulatory Visit

## 2012-04-21 DIAGNOSIS — M810 Age-related osteoporosis without current pathological fracture: Secondary | ICD-10-CM

## 2012-04-22 ENCOUNTER — Telehealth: Payer: Self-pay | Admitting: *Deleted

## 2012-04-22 ENCOUNTER — Inpatient Hospital Stay: Admission: RE | Admit: 2012-04-22 | Payer: Medicare FFS | Source: Ambulatory Visit

## 2012-04-22 NOTE — Telephone Encounter (Signed)
On oxygen 

## 2012-04-22 NOTE — Telephone Encounter (Signed)
Received message from [place] at [facility] wanting to know if overnight oximetry should be performed while pt is on oxygen or on room air?  Please advise.

## 2012-04-22 NOTE — Telephone Encounter (Signed)
Notified Courtney. She states they will perform test on pt's current setting of 2 liters O2.

## 2012-04-25 ENCOUNTER — Other Ambulatory Visit: Payer: Self-pay | Admitting: Internal Medicine

## 2012-04-25 ENCOUNTER — Ambulatory Visit (INDEPENDENT_AMBULATORY_CARE_PROVIDER_SITE_OTHER)
Admission: RE | Admit: 2012-04-25 | Discharge: 2012-04-25 | Disposition: A | Discharge status: Home / Self Care | Payer: Medicare FFS | Source: Ambulatory Visit

## 2012-04-25 DIAGNOSIS — D649 Anemia, unspecified: Secondary | ICD-10-CM

## 2012-04-25 DIAGNOSIS — M810 Age-related osteoporosis without current pathological fracture: Secondary | ICD-10-CM

## 2012-05-12 ENCOUNTER — Telehealth: Payer: Self-pay | Admitting: *Deleted

## 2012-05-12 NOTE — Telephone Encounter (Signed)
LMOM with contact name & number to inform patient that Nocturnal Study showed: "O2 Sats rarely were below normal" per Vo Dr. Chapman Moss

## 2012-05-13 ENCOUNTER — Telehealth: Payer: Self-pay | Admitting: *Deleted

## 2012-05-13 NOTE — Telephone Encounter (Signed)
Lowest t-score -3.3, c/w osteoporosis; Consider continuation of the calcium/VIt D, regular weight bearing exercise and adding bisphosphonate or Prolia therapy with follow up DXA at 25 months Notes Recorded by Edwyna Perfect, MD on 05/07/2012 at 3:05 PM The worst bone scores seem to be the same but i can't find a reclast visit since ?2011. If that's true then hasn't been receiving yearly. Hard to judge how effective it has been if hasn't been receiving recently  Spoke with patient about Reclast and she seems to be confused and cannot remember having Tx within the past [2] yrs now, she has continued with Calcium+Vitamin D supplement; reports that she cannot take Bisphosphonates, would like to do Prolia.  Informed pt of process and that we would be back in contact with her as soon as we get the first update after request is placed/SLS

## 2012-05-16 ENCOUNTER — Encounter: Payer: Self-pay | Admitting: Internal Medicine

## 2012-05-22 NOTE — Telephone Encounter (Signed)
Summary of Benefits received; Prior Authorization is required, requested form from [employer]--PA Form received, completed & faxed for Prolia Approval/SLS

## 2012-06-03 ENCOUNTER — Encounter: Payer: Self-pay | Admitting: Adult Health

## 2012-06-03 ENCOUNTER — Ambulatory Visit (INDEPENDENT_AMBULATORY_CARE_PROVIDER_SITE_OTHER): Payer: Medicare FFS | Admitting: Adult Health

## 2012-06-03 VITALS — BP 110/70 | HR 84 | Temp 97.6°F | Ht 61.0 in | Wt 99.0 lb

## 2012-06-03 DIAGNOSIS — J449 Chronic obstructive pulmonary disease, unspecified: Secondary | ICD-10-CM

## 2012-06-03 MED ORDER — ALBUTEROL SULFATE HFA 108 (90 BASE) MCG/ACT IN AERS: 2.0000 | INHALATION_SPRAY | Freq: Four times a day (QID) | RESPIRATORY_TRACT | Status: DC | PRN

## 2012-06-03 MED ORDER — TIOTROPIUM BROMIDE MONOHYDRATE 18 MCG IN CAPS: 18.0000 ug | ORAL_CAPSULE | Freq: Every day | RESPIRATORY_TRACT | Status: DC

## 2012-06-03 MED ORDER — BUDESONIDE-FORMOTEROL FUMARATE 160-4.5 MCG/ACT IN AERO: 2.0000 | INHALATION_SPRAY | Freq: Two times a day (BID) | RESPIRATORY_TRACT | Status: DC

## 2012-06-03 NOTE — Telephone Encounter (Signed)
Approval on Prolia received 08.05.13 [out of office 07.29-08.02]; LMOM with contact name & number for patient call back to discuss Insurance benefits and scheduling of OV for injection/SLS

## 2012-06-03 NOTE — Progress Notes (Signed)
  Subjective:    Patient ID: [name], female    DOB: [birth year], 71 y.o.   MRN: [id number]  HPI  16 yowf , heavy ex- smoker, quit 2002 with GOLD stg IV COPD FEV1 26% 6/10 on 24 h O2 since 2009.  -completed pulm rehab oct '11  Recurrent flares in dec '11, feb , mar, may', sep12 requiring steroid bursts   12/19/2011 - pred taper for dyspnea   4/92013 Planning cosmetic surgical procedure - clearance given Patient states breathing is about the same as last visit. c/o sob and chest tightness once x 1 day ago.  Her sister passed away 2 weeks ago from copd Denies cough, wheezing, and chest pain.  >>no changes   06/03/2012 Follow up  Pt returns for 4 month follow up with Dr. Vassie Loll   Doing well since last visit. Breathing is at her baseline.  No flare in cough or dyspnea.  No ER or hospitalizations since last ov.  No increased SABA use.  Refills sent today .  Hus[facility] just turned down for lung [facility] is relieved"  No chest pain or edema.  CXR 11/2011 -chronic changes -nad   Review of Systems  Constitutional:   No  weight loss, night sweats,  Fevers, chills, + fatigue, or  lassitude.  HEENT:   No headaches,  Difficulty swallowing,  Tooth/dental problems, or  Sore throat,                No sneezing, itching, ear ache, nasal congestion, post nasal drip,   CV:  No chest pain,  Orthopnea, PND, swelling in lower extremities, anasarca, dizziness, palpitations, syncope.   GI  No heartburn, indigestion, abdominal pain, nausea, vomiting, diarrhea, change in bowel habits, loss of appetite, bloody stools.   Resp:   No coughing up of blood.    No chest wall deformity  Skin: no rash or lesions.  GU: no dysuria, change in color of urine, no urgency or frequency.  No flank pain, no hematuria   MS:  No joint pain or swelling.  No back pain.  Psych:  No change in mood or affect. No depression or anxiety.  No memory loss.   SH:  Married  1 child  Lived at [facility]-  met her husband there        Objective:   Physical Exam   Gen. Pleasant, petite  in no distress on O2  ENT - no lesions, no post nasal drip Neck: No JVD, no thyromegaly, no carotid bruits Lungs: no use of accessory muscles, no dullness to percussion, decreased BS without rales or rhonchi  Cardiovascular: Rhythm regular, heart sounds  normal, no murmurs or gallops, no peripheral edema Musculoskeletal: No deformities, no cyanosis or clubbing         Assessment & Plan:

## 2012-06-03 NOTE — Patient Instructions (Addendum)
Continue on current regimen follow up Dr. Alva  In 3 months and As needed   

## 2012-06-03 NOTE — Assessment & Plan Note (Signed)
Compensated on present regimen.  Cont on current inhalers .  Cont O2  Immunization UTD  follow up Dr. Vassie Loll  In 4 months and As needed   Flu shot when available this fall

## 2012-06-06 ENCOUNTER — Ambulatory Visit (INDEPENDENT_AMBULATORY_CARE_PROVIDER_SITE_OTHER): Payer: Medicare FFS | Admitting: Internal Medicine

## 2012-06-06 ENCOUNTER — Encounter: Payer: Self-pay | Admitting: Internal Medicine

## 2012-06-06 VITALS — BP 150/78 | HR 103 | Temp 98.1°F | Resp 24 | Wt 99.1 lb

## 2012-06-06 DIAGNOSIS — N390 Urinary tract infection, site not specified: Secondary | ICD-10-CM

## 2012-06-06 DIAGNOSIS — R300 Dysuria: Secondary | ICD-10-CM

## 2012-06-06 LAB — POCT URINALYSIS DIPSTICK
Nitrite, UA: NEGATIVE
Spec Grav, UA: <=1.005
Urobilinogen, UA: 0.2

## 2012-06-06 MED ORDER — CIPROFLOXACIN HCL 500 MG PO TABS: 500.0000 mg | ORAL_TABLET | Freq: Two times a day (BID) | ORAL | Status: AC

## 2012-06-07 DIAGNOSIS — N390 Urinary tract infection, site not specified: Secondary | ICD-10-CM | POA: Insufficient documentation

## 2012-06-07 NOTE — Assessment & Plan Note (Signed)
UA obtained and reviewed. Begin abx tx. Followup if no improvement or worsening.

## 2012-06-07 NOTE — Progress Notes (Signed)
  Subjective:    Patient ID: [name], female    DOB: 06-09-1941, 71 y.o.   MRN: [id number]  HPI Pt presents to clinic for evaluation of possible UTI. Notes two day h/o dysuria, dribbling and frequency. Denies fever, chills, n/v, hematuria or back pain. Using cranberry juice without improvement. No other alleviating or exeacerbat factors.  Past Medical History  Diagnosis Date  . COPD (chronic obstructive pulmonary disease)   . Thyroid disease   . Colitis   . Glucose intolerance (impaired glucose tolerance)   . Polyp of colon, adenomatous   . History of small bowel obstruction   . Arthralgia   . Dyspnea   . Pallor   . Chest pain   . Osteoporosis   . Dysuria   . Weight loss   . Edema   . Weakness   . Anxiety   . Depression   . Migraine   . Hypertension   . Leukocytosis   . GERD (gastroesophageal reflux disease)    Past Surgical History  Procedure Date  . Cholecystectomy   . Abdominal hysterectomy 1993  . Tubal ligation   . Bladder repair     reports that she quit smoking about 10 years ago. Her smoking use included Cigarettes. She has a 80 pack-year smoking history. She has never used smokeless tobacco. She reports that she drinks alcohol. Her drug history not on file. family history includes Alcohol abuse in her mother; COPD in her brother; Cancer in her paternal aunt; Heart disease in her mother; Hypertension in her mother; Kidney disease in her mother; Prostate cancer in an unspecified family member; and Stroke in her mother. Allergies  Allergen Reactions  . Morphine     REACTION: nightmares     Review of Systems see hpi     Objective:   Physical Exam  Nursing note and vitals reviewed. Constitutional: She appears well-developed and well-nourished. No distress.  Skin: She is not diaphoretic.          Assessment & Plan:

## 2012-06-09 ENCOUNTER — Telehealth: Payer: Self-pay | Admitting: Internal Medicine

## 2012-06-09 NOTE — Telephone Encounter (Signed)
Patient informed of Insurance coverage for Prolia; reports [employer] as [employer], will check on coverage-pt scheduled for initial injection 08.23.13/SLS

## 2012-06-09 NOTE — Telephone Encounter (Signed)
Patient states that she saw Dr. Rodena Medin last Friday for a UTI. She was feeling better over the weekend but woke up this morning and has UTI symptoms again. Please assist.

## 2012-06-09 NOTE — Telephone Encounter (Signed)
Should have a 7d prescription that was started on 8/9. Would continue to completion and see how feels at end

## 2012-06-09 NOTE — Telephone Encounter (Signed)
Patient informed/SLS  

## 2012-06-10 NOTE — Telephone Encounter (Signed)
LMOM with contact name & number for call back from Ms. Ruby McClinton at [facility] to get information on secondary Insurance coverage [facility]] & ordering of Prolia Rx/SLS

## 2012-06-20 ENCOUNTER — Ambulatory Visit: Payer: Medicare FFS

## 2012-07-01 ENCOUNTER — Ambulatory Visit (INDEPENDENT_AMBULATORY_CARE_PROVIDER_SITE_OTHER): Payer: Medicare FFS | Admitting: Family

## 2012-07-01 ENCOUNTER — Encounter: Payer: Self-pay | Admitting: Family

## 2012-07-01 ENCOUNTER — Ambulatory Visit (INDEPENDENT_AMBULATORY_CARE_PROVIDER_SITE_OTHER): Payer: Medicare FFS | Admitting: *Deleted

## 2012-07-01 VITALS — BP 132/77 | HR 77 | Temp 97.7°F | Resp 16 | Wt 99.0 lb

## 2012-07-01 DIAGNOSIS — R197 Diarrhea, unspecified: Secondary | ICD-10-CM

## 2012-07-01 DIAGNOSIS — M810 Age-related osteoporosis without current pathological fracture: Secondary | ICD-10-CM

## 2012-07-01 MED ORDER — DENOSUMAB 60 MG/ML ~~LOC~~ SOLN
60.0000 mg | Freq: Once | SUBCUTANEOUS | Status: AC
  Administered 2012-07-01: 60 mg via SUBCUTANEOUS

## 2012-07-01 MED ORDER — METRONIDAZOLE 500 MG PO TABS: 500.0000 mg | ORAL_TABLET | Freq: Three times a day (TID) | ORAL | Status: AC

## 2012-07-01 NOTE — Patient Instructions (Addendum)
Please call if diarrhea worsens or if no improvement in 2-3 days. Make sure you are drinking plenty of fluid so you do not become dehydrated. Please obtain stool sample kit and return to lab at your earliest convenience.

## 2012-07-01 NOTE — Assessment & Plan Note (Signed)
Suspect C diff colitis.  Will treat empirically with metronidazole.  Send stool for C. Diff PCR.  Advised pt to avoid imodium, but to make sure that she is staying well hydrated.

## 2012-07-01 NOTE — Progress Notes (Signed)
Subjective:    Patient ID: [name], female    DOB: 09-25-1941, 71 y.o.   MRN: [id number]  HPI  Ms.  [name] is a 71 yr old female who presents today with chief complaint of diarrhea.  Diarrhea started yesterday. She had approximately 7 bm's yesterday.  Of these one was an episode of fecal incontinence. Today she had 2 bm's, followed by an episode of incontinence. She denies associated fever, or hematochezia.  Stool is brown in color. She reports one associated episode of lower abdominal cramping yesterday. She was treated for a urinary tract infection with cipro about 3 weeks ago.    Review of Systems    see HPI  Past Medical History  Diagnosis Date  . COPD (chronic obstructive pulmonary disease)   . Thyroid disease   . Colitis   . Glucose intolerance (impaired glucose tolerance)   . Polyp of colon, adenomatous   . History of small bowel obstruction   . Arthralgia   . Dyspnea   . Pallor   . Chest pain   . Osteoporosis   . Dysuria   . Weight loss   . Edema   . Weakness   . Anxiety   . Depression   . Migraine   . Hypertension   . Leukocytosis   . GERD (gastroesophageal reflux disease)     History   Social History  . Marital Status: Married    Spouse Name: N/A    Number of Children: N/A  . Years of Education: N/A   Occupational History  . Retired Science writer    Social History Main Topics  . Smoking status: Former Smoker -- 2.0 packs/day for 40 years    Types: Cigarettes    Quit date: 10/29/2001  . Smokeless tobacco: Never Used  . Alcohol Use: Yes     occasional use  . Drug Use: Not on file  . Sexually Active: Not on file   Other Topics Concern  . Not on file   Social History Narrative  . No narrative on file    Past Surgical History  Procedure Date  . Cholecystectomy   . Abdominal hysterectomy 1993  . Tubal ligation   . Bladder repair     Family History  Problem Relation Age of Onset  . Alcohol abuse Mother   .  Heart disease Mother   . Stroke Mother   . Hypertension Mother   . Kidney disease Mother   . COPD Brother   . Cancer Paternal Aunt     colon  . Prostate cancer      Allergies  Allergen Reactions  . Morphine     REACTION: nightmares    Current Outpatient Prescriptions on File Prior to Visit  Medication Sig Dispense Refill  . albuterol (PROVENTIL HFA;VENTOLIN HFA) 108 (90 BASE) MCG/ACT inhaler Inhale 2 puffs into the lungs every 6 (six) hours as needed.      Marland Kitchen albuterol (PROVENTIL) (2.5 MG/3ML) 0.083% nebulizer solution Take 2.5 mg by nebulization. Every 4 -6 hours as needed.       . ALPRAZolam (XANAX) 0.5 MG tablet 0.5 mg. Take 1 tablet by mouth every morning and take 2 tablets by mouth at bedtime.       . budesonide-formoterol (SYMBICORT) 160-4.5 MCG/ACT inhaler Inhale 2 puffs into the lungs 2 (two) times daily.  3 Inhaler  3  . Calcium Carbonate-Vitamin D 600-400 MG-UNIT per tablet Take 1 tablet by mouth 2 (two) times daily.        Marland Kitchen  Coenzyme Q10 ([facility] COENZYME Q10) 100 MG capsule Take 100 mg by mouth daily.        Marland Kitchen dextromethorphan-guaifenesin (MUCINEX DM) 30-600 MG per 12 hr tablet Take 1 tablet by mouth at bedtime.       Marland Kitchen ibuprofen (ADVIL,MOTRIN) 200 MG tablet Take 200 mg by mouth. Take 3 tablets at bedtime prn.      . mirtazapine (REMERON) 15 MG tablet Take 15 mg by mouth at bedtime.        Marland Kitchen omeprazole-sodium bicarbonate (ZEGERID) 40-1100 MG per capsule Take 1 capsule by mouth 2 (two) times daily.  180 capsule  3  . tiotropium (SPIRIVA) 18 MCG inhalation capsule Place 1 capsule (18 mcg total) into inhaler and inhale daily.  90 capsule  3    BP 132/77  Pulse 77  Temp 97.7 F (36.5 C) (Oral)  Resp 16  Wt 99 lb (44.906 kg)  SpO2 100%    Objective:   Physical Exam  Constitutional: She is oriented to person, place, and time. She appears well-developed and well-nourished. No distress.       Wearing portable oxygen  HENT:  Head: Normocephalic and atraumatic.    Cardiovascular: Normal rate and regular rhythm.   Pulmonary/Chest: Effort normal. No respiratory distress. She has no wheezes. She has no rales. She exhibits no tenderness.  Abdominal: Soft. Bowel sounds are normal. She exhibits no distension and no mass. There is no tenderness. There is no rebound and no guarding.  Neurological: She is alert and oriented to person, place, and time.  Skin: Skin is warm and dry.  Psychiatric: She has a normal mood and affect. Her behavior is normal. Judgment and thought content normal.          Assessment & Plan:

## 2012-07-01 NOTE — Telephone Encounter (Signed)
Patient received initial injection of Prolia, given information guide & 6-mth reminder for next injection; will f/u with [facility] as to what labs [if any] will be needed prior/SLS

## 2012-07-02 ENCOUNTER — Telehealth: Payer: Self-pay | Admitting: Family

## 2012-07-02 LAB — CLOSTRIDIUM DIFFICILE BY PCR: Toxigenic C. Difficile by PCR: NOT DETECTED

## 2012-07-02 NOTE — Telephone Encounter (Signed)
Reviewed stool study- negative for C. Diff.  Pt notified. She reports she had some diarrhea last night and again the AM. Did not pick up metronidazole. Told pt that diarrhea is likely viral etiology, no need for flagyl at this point.  OK to try imodium prn. Pt to call if symptoms are not improved in 2-3 days. Pt verbalizes understanding.

## 2012-07-07 ENCOUNTER — Telehealth: Payer: Self-pay | Admitting: Internal Medicine

## 2012-07-07 MED ORDER — CIPROFLOXACIN HCL 500 MG PO TABS: 500.0000 mg | ORAL_TABLET | Freq: Two times a day (BID) | ORAL | Status: DC

## 2012-07-07 NOTE — Telephone Encounter (Signed)
Patient states that she feels like she has a UTI again. She states that her symptoms are the same. She saw Dr. Rodena Medin a few weeks ago for the same problem and would like to know if he would call her in an antiobiotic? [facility]

## 2012-07-07 NOTE — Telephone Encounter (Signed)
Rx done; patient informed/SLS 

## 2012-07-07 NOTE — Telephone Encounter (Signed)
cipro 500 mg bid x 7d

## 2012-07-17 ENCOUNTER — Ambulatory Visit (INDEPENDENT_AMBULATORY_CARE_PROVIDER_SITE_OTHER): Payer: Medicare FFS | Admitting: Pulmonary Disease

## 2012-07-17 ENCOUNTER — Encounter: Payer: Self-pay | Admitting: Pulmonary Disease

## 2012-07-17 VITALS — BP 116/62 | HR 84 | Temp 97.6°F | Ht 61.0 in | Wt 100.4 lb

## 2012-07-17 DIAGNOSIS — J449 Chronic obstructive pulmonary disease, unspecified: Secondary | ICD-10-CM

## 2012-07-17 DIAGNOSIS — J441 Chronic obstructive pulmonary disease with (acute) exacerbation: Secondary | ICD-10-CM

## 2012-07-17 MED ORDER — PREDNISONE 10 MG PO TABS: ORAL_TABLET | ORAL | Status: DC

## 2012-07-17 MED ORDER — DEXLANSOPRAZOLE 60 MG PO CPDR: 60.0000 mg | DELAYED_RELEASE_CAPSULE | Freq: Every day | ORAL | Status: DC

## 2012-07-17 MED ORDER — AZITHROMYCIN 250 MG PO TABS: ORAL_TABLET | ORAL | Status: DC

## 2012-07-17 NOTE — Assessment & Plan Note (Signed)
Treat as copd flare Zpak Take prednisone 10 mg tabs -Take 4 tabs  daily with food x 3 days, then 3 tabs daily x 3days, then 2 tabs daily x 3 days, then 1 tab daily x3 days then stop. #40 Trial of daliresp samples x 2 weeks - start after pred taper is done,  call me with results - given frequent flares > 2/yr

## 2012-07-17 NOTE — Progress Notes (Signed)
  Subjective:    Patient ID: [name], female    DOB: Dec 03, 1940, 71 y.o.   MRN: [id number]  HPI  71 yowf , heavy ex- smoker, quit 2002 with GOLD stg IV COPD FEV1 26% 6/10 on 24 h O2 since 2009.  -completed pulm rehab oct '11  Recurrent flares in dec '11, feb , mar, may', sep12 requiring steroid bursts  12/19/2011 - pred taper for dyspnea     07/17/2012 Acute OV  c/o SOB w/ walking just 5 feet, cough w/ very little green phlem, wheezing, chest tx x couple days. possible low grade fever yesterday.  No ER or hospitalizations since last ov.  Husband just turned down for lung transplant at [facility] is relieved"  No chest pain or edema.  CXR 11/2011 -chronic changes -nad  CAT score 28  Review of Systems neg for any significant sore throat, dysphagia, itching, sneezing, nasal congestion ,fever, chills, sweats, unintended wt loss, pleuritic or exertional cp, hempoptysis, orthopnea pnd or change in chronic leg swelling. Also denies presyncope, palpitations, heartburn, abdominal pain, nausea, vomiting, diarrhea or change in bowel or urinary habits, dysuria,hematuria, rash, arthralgias, visual complaints, headache, numbness weakness or ataxia.     Objective:   Physical Exam  Gen. Pleasant, thin woman on O2 [place], in no distress ENT - no lesions, no post nasal drip Neck: No JVD, no thyromegaly, no carotid bruits Lungs: no use of accessory muscles, no dullness to percussion, decreased without rales or rhonchi  Cardiovascular: Rhythm regular, heart sounds  normal, no murmurs or gallops, no peripheral edema Musculoskeletal: No deformities, no cyanosis or clubbing        Assessment & Plan:

## 2012-07-17 NOTE — Patient Instructions (Addendum)
Zpak Take prednisone 10 mg tabs -Take 4 tabs  daily with food x 3 days, then 3 tabs daily x 3days, then 2 tabs daily x 3 days, then 1 tab daily x3 days then stop. #40 Trial of daliresp samples x 2 weeks - start after pred taper is done,  call me with results

## 2012-09-30 ENCOUNTER — Encounter: Payer: Self-pay | Admitting: Pulmonary Disease

## 2012-09-30 ENCOUNTER — Ambulatory Visit (INDEPENDENT_AMBULATORY_CARE_PROVIDER_SITE_OTHER): Payer: Medicare FFS | Admitting: Pulmonary Disease

## 2012-09-30 VITALS — BP 120/70 | HR 76 | Temp 98.0°F | Ht 60.0 in | Wt 100.0 lb

## 2012-09-30 DIAGNOSIS — J449 Chronic obstructive pulmonary disease, unspecified: Secondary | ICD-10-CM

## 2012-09-30 NOTE — Assessment & Plan Note (Signed)
gold C COPD FEV1 26% 03/2009 on 24 h O2 since 2009  Stable Ct symbicort, spiriva Discussed plan for early treatment of bronchitis Vaccines uptodate

## 2012-09-30 NOTE — Progress Notes (Signed)
  Subjective:    Patient ID: [name], female    DOB: [birth year], 71 y.o.   MRN: [id number]  HPI  8 yowf , heavy ex- smoker, quit 2002 with GOLD stg IV COPD FEV1 26% 6/10 on 24 h O2 since 2009.  -completed pulm rehab oct '11  Recurrent flares 2-3/yr 2/13, 9/13 requiring steroid bursts  CXR 11/2011 -chronic changes  07/17/2012 Acute OV CAT score 28 >> z -pak, pred  09/30/2012 No ER or hospitalizations since last ov.  No chest pain or edema.  daliresp trial last visit - developed headache & vomiting &had to stop. Active around the house Compliant with O2    Review of Systems  Constitutional: Negative for fever and unexpected weight change.  HENT: Negative for ear pain, nosebleeds, congestion, sore throat, rhinorrhea, sneezing, trouble swallowing, dental problem, postnasal drip and sinus pressure.   Eyes: Negative for redness and itching.  Respiratory: Negative for cough, chest tightness, shortness of breath and wheezing.   Cardiovascular: Negative for palpitations and leg swelling.  Gastrointestinal: Negative for nausea and vomiting.  Genitourinary: Negative for dysuria.  Musculoskeletal: Negative for joint swelling.  Skin: Negative for rash.  Neurological: Negative for headaches.  Hematological: Does not bruise/bleed easily.  Psychiatric/Behavioral: Negative for dysphoric mood. The patient is not nervous/anxious.        Objective:   Physical Exam  Gen. Pleasant, thin woman, in no distress ENT - no lesions, no post nasal drip Neck: No JVD, no thyromegaly, no carotid bruits Lungs: no use of accessory muscles, no dullness to percussion, clear without rales or rhonchi  Cardiovascular: Rhythm regular, heart sounds  normal, no murmurs or gallops, no peripheral edema Musculoskeletal: No deformities, no cyanosis or clubbing        Assessment & Plan:

## 2012-09-30 NOTE — Patient Instructions (Signed)
Stay on symbicort & spiriva

## 2012-10-13 ENCOUNTER — Ambulatory Visit: Payer: Medicare FFS | Admitting: Internal Medicine

## 2012-10-20 ENCOUNTER — Ambulatory Visit: Payer: Medicare FFS | Admitting: Internal Medicine

## 2012-12-02 ENCOUNTER — Other Ambulatory Visit (HOSPITAL_COMMUNITY)
Admission: RE | Admit: 2012-12-02 | Discharge: 2012-12-02 | Disposition: A | Discharge status: Home / Self Care | Payer: Medicare FFS | Source: Ambulatory Visit | Attending: Family Medicine | Admitting: Family Medicine

## 2012-12-02 ENCOUNTER — Encounter: Payer: Self-pay | Admitting: Family Medicine

## 2012-12-02 ENCOUNTER — Ambulatory Visit (INDEPENDENT_AMBULATORY_CARE_PROVIDER_SITE_OTHER): Payer: Medicare FFS | Admitting: Family Medicine

## 2012-12-02 VITALS — BP 128/78 | HR 88 | Temp 97.8°F | Ht 60.0 in | Wt 102.4 lb

## 2012-12-02 DIAGNOSIS — Z124 Encounter for screening for malignant neoplasm of cervix: Secondary | ICD-10-CM | POA: Insufficient documentation

## 2012-12-02 DIAGNOSIS — I10 Essential (primary) hypertension: Secondary | ICD-10-CM

## 2012-12-02 DIAGNOSIS — N952 Postmenopausal atrophic vaginitis: Secondary | ICD-10-CM

## 2012-12-02 MED ORDER — ESTRADIOL 0.1 MG/GM VA CREA: 2.0000 g | TOPICAL_CREAM | Freq: Every day | VAGINAL | Status: DC

## 2012-12-02 NOTE — Patient Instructions (Addendum)
6 month visit for annual with labs prior to include lipid, renal, cbc, tsh, hepatic Try I cool   Menopause Menopause is the normal time of life when menstrual periods stop completely. Menopause is complete when you have missed 12 consecutive menstrual periods. It usually occurs between the ages of 9 to 18, with an average age of 75. Very rarely does a woman develop menopause before 71 years old. At menopause, your ovaries stop producing the female hormones, estrogen and progesterone. This can cause undesirable symptoms and also affect your health. Sometimes the symptoms may occur 4 to 5 years before the menopause begins. There is no relationship between menopause and:  Oral contraceptives.  Number of children you had.  Race.  The age your menstrual periods started (menarche). Heavy smokers and very thin women may develop menopause earlier in life. CAUSES  The ovaries stop producing the female hormones estrogen and progesterone.  Other causes include:  Surgery to remove both ovaries.  The ovaries stop functioning for no known reason.  Tumors of the pituitary gland in the brain.  Medical disease that affects the ovaries and hormone production.  Radiation treatment to the abdomen or pelvis.  Chemotherapy that affects the ovaries. SYMPTOMS   Hot flashes.  Night sweats.  Decrease in sex drive.  Vaginal dryness and thinning of the vagina causing painful intercourse.  Dryness of the skin and developing wrinkles.  Headaches.  Tiredness.  Irritability.  Memory problems.  Weight gain.  Bladder infections.  Hair growth of the face and chest.  Infertility. More serious symptoms include:  Loss of bone (osteoporosis) causing breaks (fractures).  Depression.  Hardening and narrowing of the arteries (atherosclerosis) causing heart attacks and strokes. DIAGNOSIS   When the menstrual periods have stopped for 12 straight months.  Physical exam.  Hormone studies of  the blood. TREATMENT  There are many treatment choices and nearly as many questions about them. The decisions to treat or not to treat menopausal changes is an individual choice made with your caregiver. Your caregiver can discuss the treatments with you. Together, you can decide which treatment will work best for you. Your treatment choices may include:   Hormone therapy (estorgen and progesterone).  Non-hormonal medications.  Treating the individual symptoms with medication (for example antidepressants for depression).  Herbal medications that may help specific symptoms.  Counseling by a psychiatrist or psychologist.  Group therapy.  Lifestyle changes including:  Eating healthy.  Regular exercise.  Limiting caffeine and alcohol.  Stress management and meditation.  No treatment. HOME CARE INSTRUCTIONS   Take the medication your caregiver gives you as directed.  Get plenty of sleep and rest.  Exercise regularly.  Eat a diet that contains calcium (good for the bones) and soy products (acts like estrogen hormone).  Avoid alcoholic beverages.  Do not smoke.  If you have hot flashes, dress in layers.  Take supplements, calcium and vitamin D to strengthen bones.  You can use over-the-counter lubricants or moisturizers for vaginal dryness.  Group therapy is sometimes very helpful.  Acupuncture may be helpful in some cases. SEEK MEDICAL CARE IF:   You are not sure you are in menopause.  You are having menopausal symptoms and need advice and treatment.  You are still having menstrual periods after age 69.  You have pain with intercourse.  Menopause is complete (no menstrual period for 12 months) and you develop vaginal bleeding.  You need a referral to a specialist (gynecologist, psychiatrist or psychologist) for treatment. SEEK  IMMEDIATE MEDICAL CARE IF:   You have severe depression.  You have excessive vaginal bleeding.  You fell and think you have a  broken bone.  You have pain when you urinate.  You develop leg or chest pain.  You have a fast pounding heart beat (palpitations).  You have severe headaches.  You develop vision problems.  You feel a lump in your breast.  You have abdominal pain or severe indigestion. Document Released: 01/05/2004 Document Revised: 01/07/2012 Document Reviewed: 08/12/2008 [facility] Patient Information 2013 [place], [place].

## 2012-12-07 ENCOUNTER — Encounter: Payer: Self-pay | Admitting: Family Medicine

## 2012-12-07 DIAGNOSIS — N952 Postmenopausal atrophic vaginitis: Secondary | ICD-10-CM

## 2012-12-07 HISTORY — DX: Postmenopausal atrophic vaginitis: N95.2

## 2012-12-07 NOTE — Assessment & Plan Note (Signed)
Pap done today, no concerns identified on exam today.

## 2012-12-07 NOTE — Progress Notes (Signed)
Patient ID: [name], female   DOB: June 22, 1941, 72 y.o.   MRN: [id number] [name] [id number] [birth year] 12/07/2012      Progress Note-Follow Up  Subjective  Chief Complaint  Chief Complaint  Patient presents with  . Gynecologic Exam    pap    HPI  Patient is a 72 year old Caucasian female who is in today for GYN exam. Overall she feels well. No recent illness, fevers, headaches, chest pain, palpitations, shortness of breath, GI or GU complaints. Is complaining of some vaginal dryness and occasional irritation but no discharge or other significant GYN symptoms. No GI or GU complaints. No breast concerns identified today.  Past Medical History  Diagnosis Date  . COPD (chronic obstructive pulmonary disease)   . Thyroid disease   . Colitis   . Glucose intolerance (impaired glucose tolerance)   . Polyp of colon, adenomatous   . History of small bowel obstruction   . Arthralgia   . Dyspnea   . Pallor   . Chest pain   . Osteoporosis   . Dysuria   . Weight loss   . Edema   . Weakness   . Anxiety   . Depression   . Migraine   . Hypertension   . Leukocytosis   . GERD (gastroesophageal reflux disease)     Past Surgical History  Procedure Laterality Date  . Cholecystectomy    . Abdominal hysterectomy  1993  . Tubal ligation    . Bladder repair      Family History  Problem Relation Age of Onset  . Alcohol abuse Mother   . Heart disease Mother   . Stroke Mother   . Hypertension Mother   . Kidney disease Mother   . COPD Brother   . Cancer Paternal Aunt     colon  . Prostate cancer      History   Social History  . Marital Status: Married    Spouse Name: N/A    Number of Children: N/A  . Years of Education: N/A   Occupational History  . Retired Science writer    Social History Main Topics  . Smoking status: Former Smoker -- 2.00 packs/day for 40 years    Types: Cigarettes    Quit date: 10/29/2001  . Smokeless tobacco: Never  Used  . Alcohol Use: Yes     Comment: occasional use  . Drug Use: Not on file  . Sexually Active: Not on file   Other Topics Concern  . Not on file   Social History Narrative  . No narrative on file    Current Outpatient Prescriptions on File Prior to Visit  Medication Sig Dispense Refill  . albuterol (PROVENTIL HFA;VENTOLIN HFA) 108 (90 BASE) MCG/ACT inhaler Inhale 2 puffs into the lungs every 6 (six) hours as needed.      Marland Kitchen albuterol (PROVENTIL) (2.5 MG/3ML) 0.083% nebulizer solution Take 2.5 mg by nebulization. Every 4 -6 hours as needed.       . ALPRAZolam (XANAX) 0.5 MG tablet 0.5 mg. Take 1 tablet by mouth every morning and take 2 tablets by mouth at bedtime.       . budesonide-formoterol (SYMBICORT) 160-4.5 MCG/ACT inhaler Inhale 2 puffs into the lungs 2 (two) times daily.  3 Inhaler  3  . Calcium Carbonate-Vitamin D 600-400 MG-UNIT per tablet Take 1 tablet by mouth 2 (two) times daily.        . Coenzyme Q10 ([facility] COENZYME Q10) 100 MG capsule Take 100  mg by mouth daily.        Marland Kitchen dextromethorphan-guaifenesin (MUCINEX DM) 30-600 MG per 12 hr tablet Take 1 tablet by mouth at bedtime.       . fexofenadine (ALLEGRA) 180 MG tablet Take 180 mg by mouth daily.      . mirtazapine (REMERON) 15 MG tablet Take 15 mg by mouth at bedtime.        . Naproxen Sodium (ALEVE PO) Take by mouth as needed.      Marland Kitchen omeprazole-sodium bicarbonate (ZEGERID) 40-1100 MG per capsule Take 1 capsule by mouth 2 (two) times daily.  180 capsule  3  . tiotropium (SPIRIVA) 18 MCG inhalation capsule Place 1 capsule (18 mcg total) into inhaler and inhale daily.  90 capsule  3   No current facility-administered medications on file prior to visit.    Allergies  Allergen Reactions  . Morphine     REACTION: nightmares    Review of Systems  Review of Systems  Constitutional: Negative for fever and malaise/fatigue.  HENT: Negative for congestion.   Eyes: Negative for discharge.  Respiratory: Negative for  shortness of breath.   Cardiovascular: Negative for chest pain, palpitations and leg swelling.  Gastrointestinal: Negative for nausea, abdominal pain and diarrhea.  Genitourinary: Negative for dysuria.  Musculoskeletal: Negative for falls.  Skin: Negative for rash.  Neurological: Negative for loss of consciousness and headaches.  Endo/Heme/Allergies: Negative for polydipsia.  Psychiatric/Behavioral: Negative for depression and suicidal ideas. The patient is not nervous/anxious and does not have insomnia.     Objective  BP 128/78  Pulse 88  Temp(Src) 97.8 F (36.6 C) (Oral)  Ht 5' (1.524 m)  Wt 102 lb 6.4 oz (46.448 kg)  BMI 20 kg/m2  SpO2 98%  Physical Exam  Physical Exam  Constitutional: She is oriented to person, place, and time and well-developed, well-nourished, and in no distress. No distress.  HENT:  Head: Normocephalic and atraumatic.  Eyes: Conjunctivae are normal.  Neck: Neck supple. No thyromegaly present.  Cardiovascular: Normal rate, regular rhythm and normal heart sounds.   No murmur heard. Pulmonary/Chest: Effort normal and breath sounds normal. She has no wheezes.  Abdominal: She exhibits no distension and no mass.  Genitourinary: Vagina normal, right adnexa normal and left adnexa normal. No vaginal discharge found.  Breast exam unremarkable  Musculoskeletal: She exhibits no edema.  Lymphadenopathy:    She has no cervical adenopathy.  Neurological: She is alert and oriented to person, place, and time.  Skin: Skin is warm and dry. No rash noted. She is not diaphoretic.  Psychiatric: Memory, affect and judgment normal.    Lab Results  Component Value Date   TSH 2.064 04/15/2012   Lab Results  Component Value Date   WBC 7.6 04/15/2012   HGB 11.6* 04/15/2012   HCT 35.9* 04/15/2012   MCV 86.9 04/15/2012   PLT 370 04/15/2012   Lab Results  Component Value Date   CREATININE 0.46* 04/15/2012   BUN 23 04/15/2012   NA 142 04/15/2012   K 4.4 04/15/2012   CL 103  04/15/2012   CO2 32 04/15/2012   Lab Results  Component Value Date   ALT 15 04/15/2012   AST 22 04/15/2012   ALKPHOS 82 04/15/2012   BILITOT 0.2* 04/15/2012   Lab Results  Component Value Date   CHOL 176 04/15/2012   Lab Results  Component Value Date   HDL 56 04/15/2012   Lab Results  Component Value Date   LDLCALC 92 04/15/2012  Lab Results  Component Value Date   TRIG 140 04/15/2012   Lab Results  Component Value Date   CHOLHDL 3.1 04/15/2012     Assessment & Plan  Cervical cancer screening Pap done today, no concerns identified on exam today.   HYPERTENSION, BENIGN ESSENTIAL Well controlled at today's visit, no need for meds, minimize sodium and caffeine  Atrophic vaginitis Will try Estrace pv use as few doses a week as needed to control symptoms.

## 2012-12-07 NOTE — Assessment & Plan Note (Signed)
Well controlled at today's visit, no need for meds, minimize sodium and caffeine

## 2012-12-07 NOTE — Assessment & Plan Note (Signed)
Will try Estrace pv use as few doses a week as needed to control symptoms.

## 2012-12-08 ENCOUNTER — Other Ambulatory Visit: Payer: Self-pay

## 2012-12-08 ENCOUNTER — Other Ambulatory Visit: Payer: Self-pay | Admitting: Family Medicine

## 2012-12-08 DIAGNOSIS — N952 Postmenopausal atrophic vaginitis: Secondary | ICD-10-CM

## 2012-12-08 MED ORDER — ESTRADIOL 0.1 MG/GM VA CREA: 2.0000 g | TOPICAL_CREAM | Freq: Every day | VAGINAL | Status: DC

## 2012-12-08 NOTE — Telephone Encounter (Signed)
Pt needed a printed RX for Estradiol. Pt will come by to pick up RX.   RX in front cabinet

## 2012-12-08 NOTE — Progress Notes (Signed)
Quick Note:  Patient Informed and voiced understanding ______ 

## 2012-12-17 ENCOUNTER — Other Ambulatory Visit: Payer: Self-pay

## 2012-12-17 NOTE — Telephone Encounter (Signed)
I spoke with [name] at [facility] and she states a PA has already been done on pt on 05-22-12 and is good until 05-22-13. The Authorization # is [id number]

## 2012-12-22 ENCOUNTER — Ambulatory Visit: Payer: Medicare FFS | Admitting: Pulmonary Disease

## 2012-12-23 ENCOUNTER — Encounter: Payer: Self-pay | Admitting: Adult Health

## 2012-12-23 ENCOUNTER — Ambulatory Visit (INDEPENDENT_AMBULATORY_CARE_PROVIDER_SITE_OTHER): Payer: Medicare FFS | Admitting: Adult Health

## 2012-12-23 VITALS — BP 102/62 | HR 87 | Temp 97.6°F | Wt 101.0 lb

## 2012-12-23 DIAGNOSIS — J449 Chronic obstructive pulmonary disease, unspecified: Secondary | ICD-10-CM

## 2012-12-23 MED ORDER — PREDNISONE 10 MG PO TABS: ORAL_TABLET | ORAL | Status: DC

## 2012-12-23 MED ORDER — AMOXICILLIN-POT CLAVULANATE 875-125 MG PO TABS: 1.0000 | ORAL_TABLET | Freq: Two times a day (BID) | ORAL | Status: DC

## 2012-12-23 NOTE — Assessment & Plan Note (Signed)
Exacerbation   Plan  Augmentin 875 mg  Twice daily  For 7 days w/ food.  Mucinex DM Twice daily  As needed  .cough/congestion Fluids and rest.  Prednisone taper over next week.  Please contact office for sooner follow up if symptoms do not improve or worsen or seek emergency care  follow up as planned next month  and As needed

## 2012-12-23 NOTE — Patient Instructions (Addendum)
Augmentin 875 mg  Twice daily  For 7 days w/ food.  Mucinex DM Twice daily  As needed  .cough/congestion Fluids and rest.  Prednisone taper over next week.  Please contact office for sooner follow up if symptoms do not improve or worsen or seek emergency care  follow up as planned next month  and As needed

## 2012-12-23 NOTE — Progress Notes (Signed)
  Subjective:    Patient ID: [name], female    DOB: [birth year], 72 y.o.   MRN: [id number]  HPI  35 yowf , heavy ex- smoker, quit 2002 with GOLD stg IV COPD FEV1 26% 6/10 on 24 h O2 since 2009.  -completed pulm rehab oct '11  Recurrent flares 2-3/yr 2/13, 9/13 requiring steroid bursts  CXR 11/2011 -chronic changes  07/17/2012 Acute OV CAT score 28 >> z -pak, pred  12/23/2012 Acute OV  Pt c/o sore throat, chest congestion, dry cough, increased SOB, chest discomfort x 3 days. Pt took 30mg  of prednisone yesterday and today. Seems to be helping but still very tight with wheezing. No fever or body aches Cough is dry, unable to get thick mucus up .  Using Nebs Three times a day  For last 2 days -seems to help a lot .  No hemoptysis or edema.    Review of Systems  Constitutional: Negative for fever and unexpected weight change.  HENT: Positive for sore throat and postnasal drip. Negative for ear pain, nosebleeds, congestion, rhinorrhea, sneezing, trouble swallowing, dental problem and sinus pressure.   Eyes: Negative for redness and itching.  Respiratory: Positive for cough, chest tightness, shortness of breath and wheezing.   Cardiovascular: Negative for palpitations and leg swelling.  Gastrointestinal: Negative for nausea and vomiting.  Genitourinary: Negative for dysuria.  Musculoskeletal: Negative for joint swelling.  Skin: Negative for rash.  Neurological: Negative for headaches.  Hematological: Does not bruise/bleed easily.  Psychiatric/Behavioral: Negative for dysphoric mood. The patient is not nervous/anxious.        Objective:   Physical Exam  Gen. Pleasant, thin woman, in no distress ENT - no lesions, no post nasal drip Neck: No JVD, no thyromegaly, no carotid bruits Lungs: exp wheezing noted bilaterally,   Cardiovascular: Rhythm regular, heart sounds  normal, no murmurs or gallops, no peripheral edema Musculoskeletal: No deformities, no cyanosis or clubbing         Assessment & Plan:

## 2012-12-27 ENCOUNTER — Inpatient Hospital Stay (HOSPITAL_COMMUNITY)
Admission: EM | Admit: 2012-12-27 | Discharge: 2013-01-01 | DRG: 189 | Disposition: A | Discharge status: Home / Self Care | Payer: Medicare FFS | Attending: Family Medicine | Admitting: Family Medicine

## 2012-12-27 ENCOUNTER — Emergency Department (HOSPITAL_COMMUNITY): Payer: Medicare FFS

## 2012-12-27 ENCOUNTER — Other Ambulatory Visit: Payer: Self-pay

## 2012-12-27 ENCOUNTER — Encounter (HOSPITAL_COMMUNITY): Payer: Self-pay | Admitting: Emergency Medicine

## 2012-12-27 DIAGNOSIS — Z681 Body mass index (BMI) 19 or less, adult: Secondary | ICD-10-CM

## 2012-12-27 DIAGNOSIS — E46 Unspecified protein-calorie malnutrition: Secondary | ICD-10-CM | POA: Diagnosis present

## 2012-12-27 DIAGNOSIS — I10 Essential (primary) hypertension: Secondary | ICD-10-CM | POA: Diagnosis present

## 2012-12-27 DIAGNOSIS — J449 Chronic obstructive pulmonary disease, unspecified: Secondary | ICD-10-CM | POA: Diagnosis present

## 2012-12-27 DIAGNOSIS — F411 Generalized anxiety disorder: Secondary | ICD-10-CM | POA: Diagnosis present

## 2012-12-27 DIAGNOSIS — R7309 Other abnormal glucose: Secondary | ICD-10-CM | POA: Diagnosis not present

## 2012-12-27 DIAGNOSIS — D649 Anemia, unspecified: Secondary | ICD-10-CM

## 2012-12-27 DIAGNOSIS — D638 Anemia in other chronic diseases classified elsewhere: Secondary | ICD-10-CM | POA: Diagnosis present

## 2012-12-27 DIAGNOSIS — I498 Other specified cardiac arrhythmias: Secondary | ICD-10-CM | POA: Diagnosis present

## 2012-12-27 DIAGNOSIS — R634 Abnormal weight loss: Secondary | ICD-10-CM

## 2012-12-27 DIAGNOSIS — T380X5A Adverse effect of glucocorticoids and synthetic analogues, initial encounter: Secondary | ICD-10-CM | POA: Diagnosis not present

## 2012-12-27 DIAGNOSIS — J441 Chronic obstructive pulmonary disease with (acute) exacerbation: Secondary | ICD-10-CM | POA: Diagnosis present

## 2012-12-27 DIAGNOSIS — R0902 Hypoxemia: Secondary | ICD-10-CM

## 2012-12-27 DIAGNOSIS — J9620 Acute and chronic respiratory failure, unspecified whether with hypoxia or hypercapnia: Principal | ICD-10-CM | POA: Diagnosis present

## 2012-12-27 DIAGNOSIS — R350 Frequency of micturition: Secondary | ICD-10-CM | POA: Diagnosis present

## 2012-12-27 DIAGNOSIS — Z9981 Dependence on supplemental oxygen: Secondary | ICD-10-CM

## 2012-12-27 LAB — BASIC METABOLIC PANEL
BUN: 17 mg/dL (ref 6–23)
CO2: 28 mEq/L (ref 19–32)
Calcium: 8.3 mg/dL — ABNORMAL LOW (ref 8.4–10.5)
Chloride: 99 mEq/L (ref 96–112)
Creatinine, Ser: 0.48 mg/dL — ABNORMAL LOW (ref 0.50–1.10)
GFR calc Af Amer: >90 mL/min (ref >90)
GFR calc non Af Amer: >90 mL/min (ref >90)
Glucose, Bld: 122 mg/dL — ABNORMAL HIGH (ref 70–99)
Potassium: 3.9 mEq/L (ref 3.5–5.1)
Sodium: 137 mEq/L (ref 135–145)

## 2012-12-27 LAB — BLOOD GAS, ARTERIAL
Acid-Base Excess: 7 mmol/L — ABNORMAL HIGH (ref 0.0–2.0)
Bicarbonate: 32.9 mEq/L — ABNORMAL HIGH (ref 20.0–24.0)
Drawn by: 307971
O2 Content: 3 L/min
O2 Saturation: 95.1 %
Patient temperature: 98.6
TCO2: 29.9 mmol/L (ref 0–100)
pCO2 arterial: 55.2 mmHg — ABNORMAL HIGH (ref 35.0–45.0)
pH, Arterial: 7.392 (ref 7.350–7.450)
pO2, Arterial: 75.4 mmHg — ABNORMAL LOW (ref 80.0–100.0)

## 2012-12-27 LAB — CBC
HCT: 37.5 % (ref 36.0–46.0)
Hemoglobin: 11.4 g/dL — ABNORMAL LOW (ref 12.0–15.0)
MCH: 26.6 pg (ref 26.0–34.0)
MCHC: 30.4 g/dL (ref 30.0–36.0)
MCV: 87.6 fL (ref 78.0–100.0)
Platelets: 354 10*3/uL (ref 150–400)
RBC: 4.28 MIL/uL (ref 3.87–5.11)
RDW: 16.2 % — ABNORMAL HIGH (ref 11.5–15.5)
WBC: 14.3 10*3/uL — ABNORMAL HIGH (ref 4.0–10.5)

## 2012-12-27 LAB — TROPONIN I: Troponin I: <0.3 ng/mL (ref <0.30)

## 2012-12-27 LAB — PRO B NATRIURETIC PEPTIDE: Pro B Natriuretic peptide (BNP): 126.4 pg/mL — ABNORMAL HIGH (ref 0–125)

## 2012-12-27 MED ORDER — ALBUTEROL (5 MG/ML) CONTINUOUS INHALATION SOLN
15.0000 mg | INHALATION_SOLUTION | Freq: Once | RESPIRATORY_TRACT | Status: AC
  Administered 2012-12-27: 15 mg via RESPIRATORY_TRACT

## 2012-12-27 MED ORDER — CALCIUM CARBONATE-VITAMIN D 500-200 MG-UNIT PO TABS
1.0000 | ORAL_TABLET | Freq: Two times a day (BID) | ORAL | Status: DC
  Administered 2012-12-27 – 2013-01-01 (×11): 1 via ORAL
  Filled 2012-12-27 (×12): qty 1

## 2012-12-27 MED ORDER — ALPRAZOLAM 1 MG PO TABS
1.0000 mg | ORAL_TABLET | Freq: Every day | ORAL | Status: DC
  Administered 2012-12-27: 1 mg via ORAL
  Filled 2012-12-27: qty 1

## 2012-12-27 MED ORDER — SODIUM CHLORIDE 0.9 % IV SOLN
INTRAVENOUS | Status: DC
  Administered 2012-12-27 – 2012-12-28 (×3): via INTRAVENOUS

## 2012-12-27 MED ORDER — LEVOFLOXACIN IN D5W 750 MG/150ML IV SOLN
750.0000 mg | INTRAVENOUS | Status: AC
  Administered 2012-12-27 – 2012-12-31 (×5): 750 mg via INTRAVENOUS
  Filled 2012-12-27 (×5): qty 150

## 2012-12-27 MED ORDER — LEVALBUTEROL HCL 0.63 MG/3ML IN NEBU
0.6300 mg | INHALATION_SOLUTION | RESPIRATORY_TRACT | Status: DC | PRN
  Filled 2012-12-27: qty 3

## 2012-12-27 MED ORDER — SODIUM CHLORIDE 0.9 % IJ SOLN
3.0000 mL | Freq: Two times a day (BID) | INTRAMUSCULAR | Status: DC
  Administered 2012-12-29 – 2013-01-01 (×6): 3 mL via INTRAVENOUS

## 2012-12-27 MED ORDER — ALBUTEROL SULFATE (5 MG/ML) 0.5% IN NEBU: 5.0000 mg | INHALATION_SOLUTION | RESPIRATORY_TRACT | Status: DC | PRN

## 2012-12-27 MED ORDER — IPRATROPIUM BROMIDE 0.02 % IN SOLN
0.5000 mg | Freq: Four times a day (QID) | RESPIRATORY_TRACT | Status: DC
  Administered 2012-12-27 – 2012-12-28 (×4): 0.5 mg via RESPIRATORY_TRACT
  Filled 2012-12-27 (×4): qty 2.5

## 2012-12-27 MED ORDER — CALCIUM CARBONATE-VITAMIN D 600-400 MG-UNIT PO TABS: 1.0000 | ORAL_TABLET | Freq: Two times a day (BID) | ORAL | Status: DC

## 2012-12-27 MED ORDER — ONDANSETRON HCL 4 MG/2ML IJ SOLN: 4.0000 mg | Freq: Four times a day (QID) | INTRAMUSCULAR | Status: AC | PRN

## 2012-12-27 MED ORDER — ENOXAPARIN SODIUM 40 MG/0.4ML ~~LOC~~ SOLN
40.0000 mg | SUBCUTANEOUS | Status: DC
  Administered 2012-12-27 – 2012-12-31 (×5): 40 mg via SUBCUTANEOUS
  Filled 2012-12-27 (×6): qty 0.4

## 2012-12-27 MED ORDER — ACETAMINOPHEN 650 MG RE SUPP: 650.0000 mg | Freq: Four times a day (QID) | RECTAL | Status: DC | PRN

## 2012-12-27 MED ORDER — ACETAMINOPHEN 325 MG PO TABS
650.0000 mg | ORAL_TABLET | Freq: Four times a day (QID) | ORAL | Status: DC | PRN
Start: 2012-12-27 — End: 2013-01-01

## 2012-12-27 MED ORDER — ALPRAZOLAM 0.5 MG PO TABS: 0.5000 mg | ORAL_TABLET | Freq: Two times a day (BID) | ORAL | Status: DC

## 2012-12-27 MED ORDER — BENZONATATE 100 MG PO CAPS
200.0000 mg | ORAL_CAPSULE | Freq: Two times a day (BID) | ORAL | Status: DC
  Administered 2012-12-27 – 2013-01-01 (×11): 200 mg via ORAL
  Filled 2012-12-27 (×13): qty 2

## 2012-12-27 MED ORDER — PANTOPRAZOLE SODIUM 40 MG PO TBEC
40.0000 mg | DELAYED_RELEASE_TABLET | Freq: Every day | ORAL | Status: DC
  Administered 2012-12-27 – 2013-01-01 (×6): 40 mg via ORAL
  Filled 2012-12-27 (×7): qty 1

## 2012-12-27 MED ORDER — METHYLPREDNISOLONE SODIUM SUCC 125 MG IJ SOLR
80.0000 mg | Freq: Four times a day (QID) | INTRAMUSCULAR | Status: DC
  Administered 2012-12-27 – 2012-12-28 (×5): 80 mg via INTRAVENOUS
  Filled 2012-12-27 (×7): qty 1.28

## 2012-12-27 MED ORDER — LEVALBUTEROL HCL 0.63 MG/3ML IN NEBU
0.6300 mg | INHALATION_SOLUTION | Freq: Four times a day (QID) | RESPIRATORY_TRACT | Status: DC
  Administered 2012-12-27 – 2012-12-28 (×4): 0.63 mg via RESPIRATORY_TRACT
  Filled 2012-12-27 (×8): qty 3

## 2012-12-27 MED ORDER — DM-GUAIFENESIN ER 30-600 MG PO TB12
1.0000 | ORAL_TABLET | Freq: Two times a day (BID) | ORAL | Status: DC | PRN
  Filled 2012-12-27: qty 1

## 2012-12-27 MED ORDER — ALBUTEROL (5 MG/ML) CONTINUOUS INHALATION SOLN
INHALATION_SOLUTION | RESPIRATORY_TRACT | Status: AC
  Filled 2012-12-27: qty 20

## 2012-12-27 MED ORDER — ALPRAZOLAM 0.5 MG PO TABS
0.5000 mg | ORAL_TABLET | Freq: Every day | ORAL | Status: DC
  Administered 2012-12-27: 0.5 mg via ORAL
  Filled 2012-12-27: qty 1

## 2012-12-27 MED ORDER — MIRTAZAPINE 15 MG PO TABS
15.0000 mg | ORAL_TABLET | Freq: Every day | ORAL | Status: DC
  Administered 2012-12-27 – 2012-12-31 (×5): 15 mg via ORAL
  Filled 2012-12-27 (×7): qty 1

## 2012-12-27 MED ORDER — IPRATROPIUM BROMIDE 0.02 % IN SOLN
0.5000 mg | Freq: Once | RESPIRATORY_TRACT | Status: AC
  Administered 2012-12-27: 0.5 mg via RESPIRATORY_TRACT
  Filled 2012-12-27: qty 2.5

## 2012-12-27 NOTE — ED Notes (Signed)
hospitalist at bedside

## 2012-12-27 NOTE — H&P (Signed)
[facility] History and Physical  [name] WGN:[id number] DOB: 10-18-1941 DOA: 12/27/2012  Referring physician: EDP PCP: [name] Edge, MD  Specialists: Sinda Du  Chief Complaint: SoB/Wheezing  HPI: [name] is a 72 y.o. female  heavy ex- smoker, with h/o GOLD stage IV COPD FEV1 26% on 3L home O2 presents to ER with above complaint. Her symptoms started with a URI 1 week ago this progressed and started experiencing worsening cough, congestion, chest tightness, wheezing went to [facility] office on Tuesday was started on Augmentin and prednisone taper. She tried this at home with nebulizer every 4 hours and noted only slight relief, last pm, noted increased chest tightness and Shortness of breath and this am could catch her breath at all after using the bathroom and came to ER via EMS. She reports being unable to cough up phlegm except one day when she coughed up a lot of phlegm after a neb treatment. She denies any pleuritic chest pain    Review of Systems: myalgias, chills The patient denies anorexia, fever, weight loss,, vision loss, decreased hearing, hoarseness, chest pain, syncope, dyspnea on exertion, peripheral edema, balance deficits, hemoptysis, abdominal pain, melena, hematochezia, severe indigestion/heartburn, hematuria, incontinence, genital sores, muscle weakness, suspicious skin lesions, transient blindness, difficulty walking, depression, unusual weight change, abnormal bleeding, enlarged lymph nodes, angioedema, and breast masses.    Past Medical History  Diagnosis Date  . COPD (chronic obstructive pulmonary disease)   . Thyroid disease   . Colitis   . Glucose intolerance (impaired glucose tolerance)   . Polyp of colon, adenomatous   . History of small bowel obstruction   . Arthralgia   . Dyspnea   . Pallor   . Chest pain   . Osteoporosis   . Dysuria   . Weight loss   . Edema   . Weakness   . Anxiety   . Depression   . Migraine   .  Hypertension   . Leukocytosis   . GERD (gastroesophageal reflux disease)   . Atrophic vaginitis 12/07/2012   Past Surgical History  Procedure Laterality Date  . Cholecystectomy    . Abdominal hysterectomy  1993  . Tubal ligation    . Bladder repair     Social History:  reports that she quit smoking about 11 years ago. Her smoking use included Cigarettes. She has a 80 pack-year smoking history. She has never used smokeless tobacco. She reports that  drinks alcohol. Her drug history is not on file. Lives at home with husband  Allergies  Allergen Reactions  . Morphine     REACTION: nightmares    Family History  Problem Relation Age of Onset  . Alcohol abuse Mother   . Heart disease Mother   . Stroke Mother   . Hypertension Mother   . Kidney disease Mother   . COPD Brother   . Cancer Paternal Aunt     colon  . Prostate cancer      Prior to Admission medications   Medication Sig Start Date End Date Taking? Authorizing Provider  albuterol (PROVENTIL HFA;VENTOLIN HFA) 108 (90 BASE) MCG/ACT inhaler Inhale 2 puffs into the lungs every 6 (six) hours as needed for wheezing or shortness of breath.  06/03/12  Yes Tammy S Parrett, NP  albuterol (PROVENTIL) (2.5 MG/3ML) 0.083% nebulizer solution Take 2.5 mg by nebulization every 4 (four) hours as needed for wheezing or shortness of breath. Every 4 -6 hours as needed.   Yes Historical Provider, MD  ALPRAZolam (XANAX) 0.5 MG tablet Take 0.5-1 mg by mouth 2 (two) times daily. Take 1 tablet by mouth every morning and take 2 tablets by mouth at bedtime.   Yes Historical Provider, MD  amoxicillin-clavulanate (AUGMENTIN) 875-125 MG per tablet Take 1 tablet by mouth 2 (two) times daily. 7 day supply started 2-25   Yes Historical Provider, MD  budesonide-formoterol (SYMBICORT) 160-4.5 MCG/ACT inhaler Inhale 2 puffs into the lungs 2 (two) times daily. 06/03/12  Yes Tammy S Parrett, NP  Calcium Carbonate-Vitamin D 600-400 MG-UNIT per tablet Take 1 tablet by  mouth 2 (two) times daily.     Yes Historical Provider, MD  Coenzyme Q10 ([employer] COENZYME Q10) 100 MG capsule Take 100 mg by mouth daily.     Yes Historical Provider, MD  dextromethorphan-guaifenesin (MUCINEX DM) 30-600 MG per 12 hr tablet Take 1 tablet by mouth at bedtime.    Yes Historical Provider, MD  fexofenadine (ALLEGRA) 180 MG tablet Take 180 mg by mouth daily.   Yes Historical Provider, MD  mirtazapine (REMERON) 15 MG tablet Take 15 mg by mouth at bedtime.     Yes Historical Provider, MD  naproxen sodium (ANAPROX) 220 MG tablet Take 220 mg by mouth 2 (two) times daily with a meal.   Yes Historical Provider, MD  omeprazole-sodium bicarbonate (ZEGERID) 40-1100 MG per capsule Take 1 capsule by mouth 2 (two) times daily. 07/24/11  Yes Tammy S Parrett, NP  predniSONE (DELTASONE) 10 MG tablet 4 tabs for 2 days, then 3 tabs for 2 days, 2 tabs for 2 days, then 1 tab for 2 days, then stop 12/23/12  Yes Tammy S Parrett, NP  tiotropium (SPIRIVA) 18 MCG inhalation capsule Place 1 capsule (18 mcg total) into inhaler and inhale daily. 06/03/12  Yes Julio Sicks, NP   Physical Exam: Filed Vitals:   12/27/12 1048 12/27/12 1249 12/27/12 1304 12/27/12 1328  BP:  104/55 119/62   Pulse:  116 122   Temp:   98.3 F (36.8 C)   TempSrc:   Axillary   Resp:  19 26   Height:   5\' 1"  (1.549 m)   Weight:   45 kg (99 lb 3.3 oz)   SpO2: 98% 94% 94% 95%     General: AAOx3, slightly tremulous after prolonged albuterol neb  HEENT: PERRLA, EOMI, NO JVD  Cardiovascular: S1S2/tachycardic  Respiratory: poor airmovement b/l, scattered exp wheezes esp in upper lobes  Abdomen: soft, NT, Bs present  Ext: no edema c/c  Neurologic: non focal  Skin: no rashes  Labs on Admission:  Basic Metabolic Panel:  Recent Labs Lab 12/27/12 1010  NA 137  K 3.9  CL 99  CO2 28  GLUCOSE 122*  BUN 17  CREATININE 0.48*  CALCIUM 8.3*   Liver Function Tests: No results found for this basename: AST, ALT, ALKPHOS,  BILITOT, PROT, ALBUMIN,  in the last 168 hours No results found for this basename: LIPASE, AMYLASE,  in the last 168 hours No results found for this basename: AMMONIA,  in the last 168 hours CBC:  Recent Labs Lab 12/27/12 1010  WBC 14.3*  HGB 11.4*  HCT 37.5  MCV 87.6  PLT 354   Cardiac Enzymes:  Recent Labs Lab 12/27/12 1010  TROPONINI <0.30    BNP (last 3 results)  Recent Labs  12/27/12 1010  PROBNP 126.4*   CBG: No results found for this basename: GLUCAP,  in the last 168 hours  Radiological Exams on Admission: Dg Chest 2 View  12/27/2012  *RADIOLOGY REPORT*  Clinical Data: Cough, shortness of breath, COPD.  CHEST - 2 VIEW  Comparison: 12/19/2011  Findings: There is hyperinflation of the lungs compatible with COPD.  Heart and mediastinal contours are within normal limits.  No focal opacities or effusions.  No acute bony abnormality.  IMPRESSION: COPD. No active cardiopulmonary disease.   Original Report Authenticated By: Charlett Nose, M.D.     EKG: Independently reviewed. No acute ST-T changes  Assessment/Plan Active Problems:   ANXIETY   HYPERTENSION, BENIGN ESSENTIAL   COPD with acute exacerbation   COPD, severe    1. Acute resp failure: due to 2 - ABG reassuring - if she deteriorates will need BIPAP  2. COPD exacerbation: - IV solumedrol, Levaquin, - Xopenex, atrovent nebs - check influenza PCR  3. Severe COPD: Gold stage 4 on 3L home O2 at baseline  4. HTN: stable  5. Anxiety: continue home anxiolytics  6. Sinus tachycardia due to 1/2 and albuterol nebs will change to xopenex  DVT proph: lovenox  Code Status: FULL Family Communication:d/w pt, husband and daughter at bedside Disposition Plan: inpatient  Time spent:  [facility] Pager [phone]  If 7PM-7AM, please contact night-coverage [email or web address] Password [facility] 12/27/2012, 4:24 PM

## 2012-12-27 NOTE — ED Notes (Signed)
Report to Kendal Hymen, RN-transfer to [phone]

## 2012-12-27 NOTE — ED Notes (Signed)
Per EMS, SOB X1 week-saw Pulmonologist on [facility]-stated she was "tight" placed on antibiotic-symptoms worsened-albuterol/atrovent treatment in route-solumedrol 125mg  given-patient unale to get relief from home meds/rescue inhaler-CBG 98

## 2012-12-27 NOTE — ED Provider Notes (Signed)
History    72-year-old female with shortness of breath. Gradual onset about a week ago and progressively worsening. Patient states that she was seen by a mid-level provider at her pulmonologist's office on Tuesday. She was started on steroids and Augmentin. Despite this, she feels like her symptoms have continued to worsen. Increase in cough. Nonproductive. Denies any chest pain. No fevers or chills. No unusual leg pain or swelling. Patient received 125 mg of Solu-Medrol and a nebulized albuterol treatment by EMS prior to arrival to the emergency room.  CSN: [id number]  Arrival date & time 12/27/12  0904   First MD Initiated Contact with Patient 12/27/12 [phone]      Chief Complaint  Patient presents with  . Shortness of Breath    (Consider location/radiation/quality/duration/timing/severity/associated sxs/prior treatment) HPI  Past Medical History  Diagnosis Date  . COPD (chronic obstructive pulmonary disease)   . Thyroid disease   . Colitis   . Glucose intolerance (impaired glucose tolerance)   . Polyp of colon, adenomatous   . History of small bowel obstruction   . Arthralgia   . Dyspnea   . Pallor   . Chest pain   . Osteoporosis   . Dysuria   . Weight loss   . Edema   . Weakness   . Anxiety   . Depression   . Migraine   . Hypertension   . Leukocytosis   . GERD (gastroesophageal reflux disease)   . Atrophic vaginitis 12/07/2012    Past Surgical History  Procedure Laterality Date  . Cholecystectomy    . Abdominal hysterectomy  1993  . Tubal ligation    . Bladder repair      Family History  Problem Relation Age of Onset  . Alcohol abuse Mother   . Heart disease Mother   . Stroke Mother   . Hypertension Mother   . Kidney disease Mother   . COPD Brother   . Cancer Paternal Aunt     colon  . Prostate cancer      History  Substance Use Topics  . Smoking status: Former Smoker -- 2.00 packs/day for 40 years    Types: Cigarettes    Quit date: 10/29/2001  .  Smokeless tobacco: Never Used  . Alcohol Use: Yes     Comment: occasional use    OB History   Grav Para Term Preterm Abortions TAB SAB Ect Mult Living                  Review of Systems  All systems reviewed and negative, other than as noted in HPI.   Allergies  Morphine  Home Medications   Current Outpatient Rx  Name  Route  Sig  Dispense  Refill  . albuterol (PROVENTIL HFA;VENTOLIN HFA) 108 (90 BASE) MCG/ACT inhaler   Inhalation   Inhale 2 puffs into the lungs every 6 (six) hours as needed for wheezing or shortness of breath.          Marland Kitchen albuterol (PROVENTIL) (2.5 MG/3ML) 0.083% nebulizer solution   Nebulization   Take 2.5 mg by nebulization every 4 (four) hours as needed for wheezing or shortness of breath. Every 4 -6 hours as needed.         . ALPRAZolam (XANAX) 0.5 MG tablet   Oral   Take 0.5-1 mg by mouth 2 (two) times daily. Take 1 tablet by mouth every morning and take 2 tablets by mouth at bedtime.         Marland Kitchen amoxicillin-clavulanate (  AUGMENTIN) 875-125 MG per tablet   Oral   Take 1 tablet by mouth 2 (two) times daily. 7 day supply started 2-25         . budesonide-formoterol (SYMBICORT) 160-4.5 MCG/ACT inhaler   Inhalation   Inhale 2 puffs into the lungs 2 (two) times daily.   3 Inhaler   3   . Calcium Carbonate-Vitamin D 600-400 MG-UNIT per tablet   Oral   Take 1 tablet by mouth 2 (two) times daily.           . Coenzyme Q10 ([employer] COENZYME Q10) 100 MG capsule   Oral   Take 100 mg by mouth daily.           Marland Kitchen dextromethorphan-guaifenesin (MUCINEX DM) 30-600 MG per 12 hr tablet   Oral   Take 1 tablet by mouth at bedtime.          . fexofenadine (ALLEGRA) 180 MG tablet   Oral   Take 180 mg by mouth daily.         . mirtazapine (REMERON) 15 MG tablet   Oral   Take 15 mg by mouth at bedtime.           . naproxen sodium (ANAPROX) 220 MG tablet   Oral   Take 220 mg by mouth 2 (two) times daily with a meal.         .  omeprazole-sodium bicarbonate (ZEGERID) 40-1100 MG per capsule   Oral   Take 1 capsule by mouth 2 (two) times daily.   180 capsule   3   . predniSONE (DELTASONE) 10 MG tablet      4 tabs for 2 days, then 3 tabs for 2 days, 2 tabs for 2 days, then 1 tab for 2 days, then stop   20 tablet   0   . tiotropium (SPIRIVA) 18 MCG inhalation capsule   Inhalation   Place 1 capsule (18 mcg total) into inhaler and inhale daily.   90 capsule   3     BP 134/78  Pulse 122  Temp(Src) 99 F (37.2 C) (Oral)  Resp 22  SpO2 98%  Physical Exam  Nursing note and vitals reviewed. Constitutional: She appears well-developed and well-nourished.  HENT:  Head: Normocephalic and atraumatic.  Eyes: Conjunctivae are normal. Right eye exhibits no discharge. Left eye exhibits no discharge.  Neck: Neck supple.  Cardiovascular: Regular rhythm and normal heart sounds.  Exam reveals no gallop and no friction rub.   No murmur heard. Tachycardic  Pulmonary/Chest: No respiratory distress. She has wheezes.  tachypneic  Abdominal: Soft. She exhibits no distension. There is no tenderness.  Musculoskeletal: She exhibits no edema and no tenderness.  Lower extremities symmetric as compared to each other. No calf tenderness. Negative Homan's. No palpable cords.   Neurological: She is alert.  Skin: Skin is warm and dry. She is not diaphoretic.  Psychiatric: She has a normal mood and affect. Her behavior is normal. Thought content normal.    ED Course  Procedures (including critical care time)  Labs Reviewed  CBC - Abnormal; Notable for the following:    WBC 14.3 (*)    Hemoglobin 11.4 (*)    RDW 16.2 (*)    All other components within normal limits  PRO B NATRIURETIC PEPTIDE - Abnormal; Notable for the following:    Pro B Natriuretic peptide (BNP) 126.4 (*)    All other components within normal limits  BASIC METABOLIC PANEL - Abnormal; Notable for the  following:    Glucose, Bld 122 (*)    Creatinine,  Ser 0.48 (*)    Calcium 8.3 (*)    All other components within normal limits  BLOOD GAS, ARTERIAL - Abnormal; Notable for the following:    pCO2 arterial 55.2 (*)    pO2, Arterial 75.4 (*)    Bicarbonate 32.9 (*)    Acid-Base Excess 7.0 (*)    All other components within normal limits  TROPONIN I   Dg Chest 2 View  12/27/2012  *RADIOLOGY REPORT*  Clinical Data: Cough, shortness of breath, COPD.  CHEST - 2 VIEW  Comparison: 12/19/2011  Findings: There is hyperinflation of the lungs compatible with COPD.  Heart and mediastinal contours are within normal limits.  No focal opacities or effusions.  No acute bony abnormality.  IMPRESSION: COPD. No active cardiopulmonary disease.   Original Report Authenticated By: Charlett Nose, M.D.    EKG:  Rhythm: sinus tach Vent. rate 115 BPM PR interval 140 ms QRS duration 86 ms QT/QTc 320/443 ms ST segments: poor baseline but does not appear to have significant ischemic changes  1. COPD exacerbation   2. Hypoxemia       MDM  72 year old female with dyspnea. Likely COPD exacerbation. Patient with diffuse wheezing on exam. Patient has been on steroids and abx for the past several days  without significant improvement. Her ABG shows hypoemia on her home oxygen requirement of 3 L. She received an hour-long nebulization w/ only minimal improvement. Consider alternative etiology such as atypical ACS, PE, anemia, etc but doubt. Pt has been taking augmentin. CXR clear and afebrile. Has leukocytosis, but has been on steroids. Will defer decision for additional abx to admitting providers.         Raeford Razor, MD 12/27/12 1227

## 2012-12-27 NOTE — ED Notes (Signed)
ZOX:[id number]<AV> Expected date:12/27/12<BR> Expected time: 9:15 AM<BR> Means of arrival:<BR> Comments:<BR> Shob, tachy, HTN

## 2012-12-28 DIAGNOSIS — D649 Anemia, unspecified: Secondary | ICD-10-CM

## 2012-12-28 DIAGNOSIS — J9620 Acute and chronic respiratory failure, unspecified whether with hypoxia or hypercapnia: Secondary | ICD-10-CM

## 2012-12-28 DIAGNOSIS — R350 Frequency of micturition: Secondary | ICD-10-CM

## 2012-12-28 LAB — URINALYSIS, ROUTINE W REFLEX MICROSCOPIC
Glucose, UA: 100 mg/dL — AB
Leukocytes, UA: NEGATIVE
Protein, ur: NEGATIVE mg/dL
Urobilinogen, UA: 0.2 mg/dL (ref 0.0–1.0)

## 2012-12-28 LAB — BASIC METABOLIC PANEL
CO2: 34 mEq/L — ABNORMAL HIGH (ref 19–32)
Calcium: 7.8 mg/dL — ABNORMAL LOW (ref 8.4–10.5)
GFR calc non Af Amer: >90 mL/min (ref >90)
Potassium: 3.9 mEq/L (ref 3.5–5.1)
Sodium: 139 mEq/L (ref 135–145)

## 2012-12-28 LAB — CBC
MCH: 26.8 pg (ref 26.0–34.0)
Platelets: 307 10*3/uL (ref 150–400)
RBC: 3.88 MIL/uL (ref 3.87–5.11)

## 2012-12-28 LAB — INFLUENZA PANEL BY PCR (TYPE A & B)
H1N1 flu by pcr: NOT DETECTED
Influenza A By PCR: NEGATIVE
Influenza B By PCR: NEGATIVE

## 2012-12-28 MED ORDER — LEVALBUTEROL HCL 0.63 MG/3ML IN NEBU
0.6300 mg | INHALATION_SOLUTION | RESPIRATORY_TRACT | Status: DC
  Administered 2012-12-28 – 2012-12-30 (×9): 0.63 mg via RESPIRATORY_TRACT
  Filled 2012-12-28 (×17): qty 3

## 2012-12-28 MED ORDER — HYDRALAZINE HCL 20 MG/ML IJ SOLN: 10.0000 mg | INTRAMUSCULAR | Status: DC | PRN

## 2012-12-28 MED ORDER — IPRATROPIUM BROMIDE 0.02 % IN SOLN
0.5000 mg | RESPIRATORY_TRACT | Status: DC
  Administered 2012-12-28 – 2012-12-30 (×9): 0.5 mg via RESPIRATORY_TRACT
  Filled 2012-12-28 (×9): qty 2.5

## 2012-12-28 MED ORDER — LEVALBUTEROL HCL 0.63 MG/3ML IN NEBU: 0.6300 mg | INHALATION_SOLUTION | RESPIRATORY_TRACT | Status: DC

## 2012-12-28 MED ORDER — METHYLPREDNISOLONE SODIUM SUCC 125 MG IJ SOLR
80.0000 mg | Freq: Three times a day (TID) | INTRAMUSCULAR | Status: DC
  Administered 2012-12-28 – 2012-12-29 (×2): 80 mg via INTRAVENOUS
  Filled 2012-12-28 (×5): qty 1.28

## 2012-12-28 MED ORDER — LEVALBUTEROL HCL 0.63 MG/3ML IN NEBU
0.6300 mg | INHALATION_SOLUTION | RESPIRATORY_TRACT | Status: DC | PRN
  Administered 2012-12-31: 0.63 mg via RESPIRATORY_TRACT
  Filled 2012-12-28: qty 3

## 2012-12-28 MED ORDER — CLONAZEPAM 0.5 MG PO TABS
0.2500 mg | ORAL_TABLET | Freq: Two times a day (BID) | ORAL | Status: DC
  Administered 2012-12-28 (×2): 0.25 mg via ORAL
  Filled 2012-12-28 (×2): qty 1

## 2012-12-28 NOTE — Progress Notes (Signed)
[facility] PROGRESS NOTE  [name]:[id number] DOB: [birth year] DOA: 12/27/2012 PCP: [name] Edge, MD  Assessment/Plan  Acute on chronic hypoxic resp failure due to acute on chronic COPD exacerbation, gold stage 4 on 3L home O2 at baseline.  Persistent mild tachypnea, but increased WOB - space solumedrol 80mg  to q8h today - continue ipra/xop with xop q2h prn - day 2 levofloxacin - flu pcr negative - On benzo, but has not tolerated morphine/narcotics well in the past  HTN, mildly elevated blood pressures likely due to stress and steroids -  Trend  -  Add hydralazine as needed  Anxiety and dyspnea, -  D/c xanax -  Start clonazepam 0.25mg  BID -  May need increased dose  Sinus tachycardia likely due to dyspnea and albuterol -  Improved -  D/c telemetry  Leukocytosis, resolved  Normocytic anemia, may be due to chronic disease or acute marrow suppression in setting of illness -  Tx for hgb < 7 -  Monitor for signs of bleeding or hemolysis (none at present)  Urinary frequency - UA neg -  Likely due to IVF  Mild hyperglycemia on morning labs, likely due to steroids  -  Wean steroids  Diet:  Healthy heart Access:  PIV IVF:  NS at 106ml/h  Proph:  lovenox  Code Status: full code Family Communication: spoke with patient alone Disposition Plan: pending improvement in dyspnea so patient able to complete ADLs, likely to home.     Consultants:  none  Procedures:  none  Antibiotics:  Levofloxacin 3/1 >>   HPI/Subjective:  STates that she feels better than yesterday.  She continues to feel [name] of breath at rest and particularly when she sits up to eat or try to use the bathroom.  Denies HA, CP, N/V/D/C, dysuria.  She has been voiding frequently, however.    Objective: Filed Vitals:   12/27/12 2226 12/28/12 0600 12/28/12 1132 12/28/12 1431  BP: 142/74 157/71  142/69  Pulse: 92 96  86  Temp: 98.1 F (36.7 C) 97.8 F (36.6 C)  97.9 F (36.6 C)   TempSrc: Oral Oral  Oral  Resp: 22 22    Height:      Weight:      SpO2: 100% 100% 96% 100%    Intake/Output Summary (Last 24 hours) at 12/28/12 1455 Last data filed at 12/28/12 1432  Gross per 24 hour  Intake 1874.4 ml  Output    400 ml  Net 1474.4 ml   Filed Weights   12/27/12 1304  Weight: 45 kg (99 lb 3.3 oz)    Exam:   General:  Thin CF, moderate respiratory distress with pursed lip breathing while speaking, SCM retractions, intercostal and subcostal retractions, forced expiratory phase  HEENT:  NCAT, MMM  Cardiovascular:  RRR, nl S1, S2 no mrg, 2+ pulses, warm extremities  Respiratory:  Absent BS at the bases, very high pitched and almost inaudible wheeze at the apices.  No rales or rhonchi.    Abdomen:    NABS, soft, NT/ND  MSK:    Normal tone and bulk, no LEE  Neuro:   Grossly intact  Data Reviewed: Basic Metabolic Panel:  Recent Labs Lab 12/27/12 1010 12/28/12 0511  NA 137 139  K 3.9 3.9  CL 99 99  CO2 28 34*  GLUCOSE 122* 136*  BUN 17 17  CREATININE 0.48* 0.49*  CALCIUM 8.3* 7.8*   Liver Function Tests: No results found for this basename: AST, ALT, ALKPHOS, BILITOT, PROT,  ALBUMIN,  in the last 168 hours No results found for this basename: LIPASE, AMYLASE,  in the last 168 hours No results found for this basename: AMMONIA,  in the last 168 hours CBC:  Recent Labs Lab 12/27/12 1010 12/28/12 0511  WBC 14.3* 7.3  HGB 11.4* 10.4*  HCT 37.5 34.2*  MCV 87.6 88.1  PLT 354 307   Cardiac Enzymes:  Recent Labs Lab 12/27/12 1010  TROPONINI <0.30   BNP (last 3 results)  Recent Labs  12/27/12 1010  PROBNP 126.4*   CBG: No results found for this basename: GLUCAP,  in the last 168 hours  No results found for this or any previous visit (from the past 240 hour(s)).   Studies: Dg Chest 2 View  12/27/2012  *RADIOLOGY REPORT*  Clinical Data: Cough, shortness of breath, COPD.  CHEST - 2 VIEW  Comparison: 12/19/2011  Findings: There is  hyperinflation of the lungs compatible with COPD.  Heart and mediastinal contours are within normal limits.  No focal opacities or effusions.  No acute bony abnormality.  IMPRESSION: COPD. No active cardiopulmonary disease.   Original Report Authenticated By: Charlett Nose, M.D.     Scheduled Meds: . benzonatate  200 mg Oral BID  . calcium-vitamin D  1 tablet Oral BID  . clonazePAM  0.25 mg Oral BID  . enoxaparin (LOVENOX) injection  40 mg Subcutaneous Q24H  . ipratropium  0.5 mg Nebulization Q4H  . levalbuterol  0.63 mg Nebulization Q4H  . levofloxacin  750 mg Intravenous Q24H  . methylPREDNISolone (SOLU-MEDROL) injection  80 mg Intravenous Q6H  . mirtazapine  15 mg Oral QHS  . pantoprazole  40 mg Oral Q1200  . sodium chloride  3 mL Intravenous Q12H   Continuous Infusions: . sodium chloride 75 mL/hr at 12/28/12 1357    Active Problems:   ANXIETY   HYPERTENSION, BENIGN ESSENTIAL   COPD with acute exacerbation   COPD, severe    Time spent: 30 min    [name], [facility]  [facility] Pager [phone]. If 7PM-7AM, please contact night-coverage at [email or web address], password [facility] 12/28/2012, 2:55 PM  LOS: 1 day

## 2012-12-28 NOTE — Progress Notes (Signed)
UR completed 

## 2012-12-29 ENCOUNTER — Ambulatory Visit: Payer: Medicare FFS

## 2012-12-29 LAB — CBC
Platelets: 355 10*3/uL (ref 150–400)
RBC: 3.67 MIL/uL — ABNORMAL LOW (ref 3.87–5.11)
WBC: 11.2 10*3/uL — ABNORMAL HIGH (ref 4.0–10.5)

## 2012-12-29 LAB — BASIC METABOLIC PANEL
Calcium: 7.8 mg/dL — ABNORMAL LOW (ref 8.4–10.5)
GFR calc non Af Amer: >90 mL/min (ref >90)
Sodium: 144 mEq/L (ref 135–145)

## 2012-12-29 MED ORDER — METHYLPREDNISOLONE SODIUM SUCC 125 MG IJ SOLR
80.0000 mg | Freq: Two times a day (BID) | INTRAMUSCULAR | Status: DC
  Administered 2012-12-29 – 2012-12-30 (×2): 80 mg via INTRAVENOUS
  Filled 2012-12-29 (×4): qty 1.28

## 2012-12-29 MED ORDER — BUDESONIDE 0.5 MG/2ML IN SUSP
0.5000 mg | Freq: Two times a day (BID) | RESPIRATORY_TRACT | Status: DC
  Administered 2012-12-29 – 2013-01-01 (×7): 0.5 mg via RESPIRATORY_TRACT
  Filled 2012-12-29 (×9): qty 2

## 2012-12-29 MED ORDER — CLONAZEPAM 0.5 MG PO TABS
0.5000 mg | ORAL_TABLET | Freq: Two times a day (BID) | ORAL | Status: DC
  Administered 2012-12-29 – 2013-01-01 (×7): 0.5 mg via ORAL
  Filled 2012-12-29 (×7): qty 1

## 2012-12-29 MED ORDER — ENSURE PUDDING PO PUDG
1.0000 | Freq: Three times a day (TID) | ORAL | Status: DC
  Administered 2012-12-29 – 2013-01-01 (×8): 1 via ORAL
  Filled 2012-12-29 (×11): qty 1

## 2012-12-29 NOTE — Progress Notes (Addendum)
[facility] PROGRESS NOTE  DESHANDA MOLITOR XBJ:[id number] DOB: 06-24-1941 DOA: 12/27/2012 PCP: Danise Edge, MD  Assessment/Plan  Acute on chronic hypoxic resp failure due to acute on chronic COPD exacerbation, gold stage 4 on 3L home O2 at baseline.  Persistent mild tachypnea, but increased WOB - continue solumedrol 80mg  q12h today - continue ipra/xop with xop q2h prn - day 3 levofloxacin - flu pcr negative  - On benzo, but has not tolerated morphine/narcotics well in the past -  Add budesonide -  Pt on max PPI  -  Denies sinus symptoms  HTN, mildly elevated blood pressures likely due to stress and steroids -  Trend  -  Add hydralazine as needed  Anxiety and dyspnea, wasn't able to sleep last night and feels anxious this AM -  Increase to clonazepam 0.5 mg BID and titrate as needed  Sinus tachycardia likely due to dyspnea and albuterol, resolved.  Urinary frequency, resolving - UA neg - d/c IVF  Mild hyperglycemia on morning labs, likely due to steroids  -  Wean steroids as tolerated  BMI 18.7.  At risk for protein-calorie malnutrition.  -  Start dietary supplements -  Liberalize diet  Diet:  Regular Access:  PIV IVF:  OFF Proph:  lovenox  Code Status: full code Family Communication: spoke with patient alone Disposition Plan: pending improvement in dyspnea so patient able to complete ADLs, likely to home.     Consultants:  none  Procedures:  none  Antibiotics:  Levofloxacin 3/1 >>   HPI/Subjective:  STates that she feels about the same as yesterday.  She continues to feel [name] of breath at rest and particularly when she sits up to eat or try to use the bathroom.  Denies HA, CP, N/V/D/C, dysuria.  Was not able to sleep well last night and feels anxious this morning.    Objective: Filed Vitals:   12/28/12 1948 12/28/12 2147 12/29/12 0430 12/29/12 0758  BP:  143/81 147/73   Pulse:  97 87   Temp:  98.6 F (37 C) 97.8 F (36.6 C)   TempSrc:   Oral Oral   Resp:  20 20   Height:      Weight:      SpO2: 97% 100% 100% 96%    Intake/Output Summary (Last 24 hours) at 12/29/12 1127 Last data filed at 12/29/12 0900  Gross per 24 hour  Intake 2242.5 ml  Output   1225 ml  Net 1017.5 ml   Filed Weights   12/27/12 1304  Weight: 45 kg (99 lb 3.3 oz)    Exam:   General:  Thin CF, moderate respiratory distress with pursed lip breathing while speaking, SCM retractions, intercostal and subcostal retractions, forced expiratory phase, stable from yesterday  HEENT:  NCAT, MMM  Cardiovascular:  RRR, nl S1, S2 no mrg, 2+ pulses, warm extremities  Respiratory:  Absent BS at the bases, high pitched and almost inaudible wheeze at the mid back and apices.  No rales or rhonchi.    Abdomen:    NABS, soft, NT/ND  MSK:    Normal tone and bulk, no LEE  Neuro:   Grossly intact  Data Reviewed: Basic Metabolic Panel:  Recent Labs Lab 12/27/12 1010 12/28/12 0511 12/29/12 0430  NA 137 139 144  K 3.9 3.9 3.8  CL 99 99 106  CO2 28 34* 34*  GLUCOSE 122* 136* 140*  BUN 17 17 20   CREATININE 0.48* 0.49* 0.44*  CALCIUM 8.3* 7.8* 7.8*   Liver  Function Tests: No results found for this basename: AST, ALT, ALKPHOS, BILITOT, PROT, ALBUMIN,  in the last 168 hours No results found for this basename: LIPASE, AMYLASE,  in the last 168 hours No results found for this basename: AMMONIA,  in the last 168 hours CBC:  Recent Labs Lab 12/27/12 1010 12/28/12 0511 12/29/12 0430  WBC 14.3* 7.3 11.2*  HGB 11.4* 10.4* 10.0*  HCT 37.5 34.2* 32.6*  MCV 87.6 88.1 88.8  PLT 354 307 355   Cardiac Enzymes:  Recent Labs Lab 12/27/12 1010  TROPONINI <0.30   BNP (last 3 results)  Recent Labs  12/27/12 1010  PROBNP 126.4*   CBG: No results found for this basename: GLUCAP,  in the last 168 hours  No results found for this or any previous visit (from the past 240 hour(s)).   Studies: No results found.  Scheduled Meds: . benzonatate  200  mg Oral BID  . budesonide (PULMICORT) nebulizer solution  0.5 mg Nebulization BID  . calcium-vitamin D  1 tablet Oral BID  . clonazePAM  0.5 mg Oral BID  . enoxaparin (LOVENOX) injection  40 mg Subcutaneous Q24H  . ipratropium  0.5 mg Nebulization Q4H  . levalbuterol  0.63 mg Nebulization Q4H  . levofloxacin  750 mg Intravenous Q24H  . methylPREDNISolone (SOLU-MEDROL) injection  80 mg Intravenous Q12H  . mirtazapine  15 mg Oral QHS  . pantoprazole  40 mg Oral Q1200  . sodium chloride  3 mL Intravenous Q12H   Continuous Infusions:    Principal Problem:   Acute-on-chronic respiratory failure Active Problems:   ANXIETY   HYPERTENSION, BENIGN ESSENTIAL   COPD with acute exacerbation   COPD, severe   Normocytic anemia   Urinary frequency    Time spent: 30 min    Mushka Laconte, [facility]  [facility] Pager [phone]. If 7PM-7AM, please contact night-coverage at [email or web address], password [facility] 12/29/2012, 11:27 AM  LOS: 2 days

## 2012-12-29 NOTE — Clinical Documentation Improvement (Signed)
    MALNUTRITION DOCUMENTATION CLARIFICATION  THIS DOCUMENT IS NOT A PERMANENT PART OF THE MEDICAL RECORD  TO RESPOND TO THE THIS QUERY, FOLLOW THE INSTRUCTIONS BELOW:  1. If needed, update documentation for the patient's encounter via the notes activity.  2. Access this query again and click edit on the In [employer].  3. After updating, or not, click F2 to complete all highlighted (required) fields concerning your review. Select "additional documentation in the medical record" OR "no additional documentation provided".  4. Click Sign note button.  5. The deficiency will fall out of your In Basket *Please let us know if you are not able to complete this workflow by phone or e-mail (listed below).  Please update your documentation within the medical record to reflect your response to this query.                                                                                        12/29/12   Dear Dr. Malachi Bonds, [facility],  In a better effort to capture your patient's severity of illness, reflect appropriate length of stay and utilization of resources, a review of the patient medical record has revealed the following indicators.    Based on your clinical judgment, please clarify and document in a progress note and/or discharge summary the clinical condition associated with the following supporting information:  In responding to this query please exercise your independent judgment.  The fact that a query is asked, does not imply that any particular answer is desired or expected.  Pt's BMI= 18.75                        Clarification Needed   Please clarify whether or not BMI in setting of WT= 99 lbs and HT=5'1"can be linked to one of the diagnoses listed below and document in pn  and d/c. Thank You!  BEST PRACTICE: When linking BMI to a diagnosis please document both BMI and diagnosis together in pn for accuracy of severity of illness (SOI) and risk of mortality (ROM).     Possible  Clinical Conditions?   AT RISK FOR malnutrition  _______Mild Malnutrition  _______Moderate Malnutrition _______Severe Malnutrition   _______Protein Calorie Malnutrition _______Severe Protein Calorie Malnutrition  _______Other Condition________________ _______Cannot clinically determine    Supporting Information: Risk Factors: Acute resp failure/Acute on chronic hypoxic resp failure, COPD exacerbation, HTN, Sinus Tachy, H/O weight loss, depression,   Signs & Symptoms: Ht 5'1"    Wt 99 lbs  BMI:  18.75    Diagnostics:   Treatment  Heart Diet  You may use possible, probable, or suspect with inpatient documentation. possible, probable, suspected diagnoses MUST be documented at the time of discharge  Reviewed: additional documentation in the medical record  Thank You,  Enis Slipper  RN, BSN, MSN/Inf, CCDS Clinical Documentation Specialist [facility] Dept Pager: [phone] / E-mail: [email or web address]  Health Information Management [place]

## 2012-12-29 NOTE — Progress Notes (Signed)
Pt in bed resting w/o any s/s of distress or pain. Pt denies any sob or chest pain. Will continue to monitor.

## 2012-12-30 DIAGNOSIS — R0902 Hypoxemia: Secondary | ICD-10-CM

## 2012-12-30 MED ORDER — LEVALBUTEROL HCL 0.63 MG/3ML IN NEBU
0.6300 mg | INHALATION_SOLUTION | Freq: Four times a day (QID) | RESPIRATORY_TRACT | Status: DC
  Administered 2012-12-30 – 2012-12-31 (×5): 0.63 mg via RESPIRATORY_TRACT
  Filled 2012-12-30 (×12): qty 3

## 2012-12-30 MED ORDER — IPRATROPIUM BROMIDE 0.02 % IN SOLN
0.5000 mg | Freq: Four times a day (QID) | RESPIRATORY_TRACT | Status: DC
  Administered 2012-12-30 – 2012-12-31 (×5): 0.5 mg via RESPIRATORY_TRACT
  Filled 2012-12-30 (×5): qty 2.5

## 2012-12-30 MED ORDER — METHYLPREDNISOLONE SODIUM SUCC 40 MG IJ SOLR
40.0000 mg | Freq: Two times a day (BID) | INTRAMUSCULAR | Status: DC
  Administered 2012-12-30 – 2013-01-01 (×4): 40 mg via INTRAVENOUS
  Filled 2012-12-30 (×6): qty 1

## 2012-12-30 NOTE — Consult Note (Signed)
PULMONARY  / CRITICAL CARE MEDICINE  Name: [name] MRN: [id number] DOB: 01-12-1941    ADMISSION DATE:  12/27/2012 CONSULTATION DATE:  3/4  REFERRING MD :  [facility]  CHIEF COMPLAINT:  Refractory dyspnea  BRIEF PATIENT DESCRIPTION: 72 yo with severe copd , O2 dependent x 5 years, AECOPD , recent viral syndrome, refractory to OPT.  SIGNIFICANT EVENTS / STUDIES:    LINES / TUBES:   CULTURES: none  ANTIBIOTICS: 3/1 levaquin  HISTORY OF PRESENT ILLNESS:   72 yo with severe copd , O2 dependent x 5 years, AECOPD , recent viral syndrome, refractory to OPT. Dr. Vassie Loll pt last seen by Parrett NP on 2-25 for aecopd, tx with abx/antivirals/prednnisone. Proved refractory to OPT tx and was admitted by [facility] 3/1. She has improved with current tx and is extermely satisfied with current MD. Husband requested pulmonary to evaluate.  PAST MEDICAL HISTORY :  Past Medical History  Diagnosis Date  . COPD (chronic obstructive pulmonary disease)   . Thyroid disease   . Colitis   . Glucose intolerance (impaired glucose tolerance)   . Polyp of colon, adenomatous   . History of small bowel obstruction   . Arthralgia   . Dyspnea   . Pallor   . Chest pain   . Osteoporosis   . Dysuria   . Weight loss   . Edema   . Weakness   . Anxiety   . Depression   . Migraine   . Hypertension   . Leukocytosis   . GERD (gastroesophageal reflux disease)   . Atrophic vaginitis 12/07/2012   Past Surgical History  Procedure Laterality Date  . Cholecystectomy    . Abdominal hysterectomy  1993  . Tubal ligation    . Bladder repair     Prior to Admission medications   Medication Sig Start Date End Date Taking? Authorizing Provider  albuterol (PROVENTIL HFA;VENTOLIN HFA) 108 (90 BASE) MCG/ACT inhaler Inhale 2 puffs into the lungs every 6 (six) hours as needed for wheezing or shortness of breath.  06/03/12  Yes Tammy S Parrett, NP  albuterol (PROVENTIL) (2.5 MG/3ML) 0.083% nebulizer solution Take 2.5 mg by  nebulization every 4 (four) hours as needed for wheezing or shortness of breath. Every 4 -6 hours as needed.   Yes Historical Provider, MD  ALPRAZolam Prudy Feeler) 0.5 MG tablet Take 0.5-1 mg by mouth 2 (two) times daily. Take 1 tablet by mouth every morning and take 2 tablets by mouth at bedtime.   Yes Historical Provider, MD  amoxicillin-clavulanate (AUGMENTIN) 875-125 MG per tablet Take 1 tablet by mouth 2 (two) times daily. 7 day supply started 2-25   Yes Historical Provider, MD  budesonide-formoterol (SYMBICORT) 160-4.5 MCG/ACT inhaler Inhale 2 puffs into the lungs 2 (two) times daily. 06/03/12  Yes Tammy S Parrett, NP  Calcium Carbonate-Vitamin D 600-400 MG-UNIT per tablet Take 1 tablet by mouth 2 (two) times daily.     Yes Historical Provider, MD  Coenzyme Q10 ([employer] COENZYME Q10) 100 MG capsule Take 100 mg by mouth daily.     Yes Historical Provider, MD  dextromethorphan-guaifenesin (MUCINEX DM) 30-600 MG per 12 hr tablet Take 1 tablet by mouth at bedtime.    Yes Historical Provider, MD  fexofenadine (ALLEGRA) 180 MG tablet Take 180 mg by mouth daily.   Yes Historical Provider, MD  mirtazapine (REMERON) 15 MG tablet Take 15 mg by mouth at bedtime.     Yes Historical Provider, MD  naproxen sodium (ANAPROX) 220 MG tablet Take  220 mg by mouth 2 (two) times daily with a meal.   Yes Historical Provider, MD  omeprazole-sodium bicarbonate (ZEGERID) 40-1100 MG per capsule Take 1 capsule by mouth 2 (two) times daily. 07/24/11  Yes Tammy S Parrett, NP  predniSONE (DELTASONE) 10 MG tablet 4 tabs for 2 days, then 3 tabs for 2 days, 2 tabs for 2 days, then 1 tab for 2 days, then stop 12/23/12  Yes Tammy S Parrett, NP  tiotropium (SPIRIVA) 18 MCG inhalation capsule Place 1 capsule (18 mcg total) into inhaler and inhale daily. 06/03/12  Yes Tammy Rogers Seeds, NP   Allergies  Allergen Reactions  . Morphine     REACTION: nightmares    FAMILY HISTORY:  Family History  Problem Relation Age of Onset  . Alcohol abuse  Mother   . Heart disease Mother   . Stroke Mother   . Hypertension Mother   . Kidney disease Mother   . COPD Brother   . Cancer Paternal Aunt     colon  . Prostate cancer     SOCIAL HISTORY:  reports that she quit smoking about 11 years ago. Her smoking use included Cigarettes. She has a 80 pack-year smoking history. She has never used smokeless tobacco. She reports that  drinks alcohol. Her drug history is not on file.  REVIEW OF SYSTEMS:   10 point review of system taken, please see HPI for positives and negatives.  SUBJECTIVE:  NAD VITAL SIGNS: Temp:  [97.8 F (36.6 C)-98 F (36.7 C)] 98 F (36.7 C) (03/04 0611) Pulse Rate:  [96-116] 96 (03/04 0611) Resp:  [18-22] 22 (03/04 0611) BP: (145-171)/(70-101) 167/86 mmHg (03/04 0611) SpO2:  [91 %-100 %] 98 % (03/04 0736)  PHYSICAL EXAMINATION: General:  Frail WF NAD atrest  Neuro:  Intact HEENT:  No LAN Neck:  No JVD Cardiovascular:  HSR RRR Lungs:  Decreased bs thr out Abdomen:  + bs Musculoskeletal:  intact Skin:  warm   Recent Labs Lab 12/27/12 1010 12/28/12 0511 12/29/12 0430  NA 137 139 144  K 3.9 3.9 3.8  CL 99 99 106  CO2 28 34* 34*  BUN 17 17 20   CREATININE 0.48* 0.49* 0.44*  GLUCOSE 122* 136* 140*    Recent Labs Lab 12/27/12 1010 12/28/12 0511 12/29/12 0430  HGB 11.4* 10.4* 10.0*  HCT 37.5 34.2* 32.6*  WBC 14.3* 7.3 11.2*  PLT 354 307 355   No results found.  ASSESSMENT / PLAN:  AECOPD in setting of severe O2 dependent . Recent viral syndrome tahat was refractory to OPT TX. She is improving nicely under the excellent care of Dr. Malachi Bonds and colleagues. Very little for PCCM to offer at this point. Plan: -Continue current interventions -add flutter valve for poor mucociliary clearance -adjusted BD's  -Have her follow up with Dr. Vassie Loll as an OPT. Call [phone](direct office number) to schedule.        Brett Canales Minor ACNP [facility] Pager [phone] till 3 pm If no answer page  [phone] 12/30/2012, 9:28 AM  Patient seen and examined, agree with above note.  I dictated the care and orders written for this patient under my direction.  Alyson Reedy, MD [phone]

## 2012-12-30 NOTE — Progress Notes (Signed)
[facility] PROGRESS NOTE  [name] JXB:[id number] DOB: [birth year] DOA: 12/27/2012 PCP: Danise Edge, MD  Assessment/Plan  Acute on chronic hypoxic resp failure due to acute on chronic COPD exacerbation, gold stage 4 on 3L home O2 at baseline.  Improved SOB today.   -  Appreciate pulmonology assistance - Wean solumedrol to 40mg  q12h today and tomorrow, then may be able to convert to oral steroids - Ipra/xop q6h with xop q2h prn - day 4 levofloxacin - flu pcr negative  - On benzo, and has not tolerated morphine/narcotics well in the past -  Continue budesonide -  Pt on max PPI  -  Denies sinus symptoms  HTN, mildly elevated blood pressures likely due to stress and steroids.  V -  Add norvasc (avoid BB and ACEI in setting of severe COPD) -  Continue hydralazine as needed -  Consider adding scheduled oral hydralazine if BP not trending down in next day  Anxiety and dyspnea improving -  Space ipra/xop -  Continue clonazepam 0.5 mg BID and titrate as needed  Mild hyperglycemia, likely due to steroids  -  Wean steroids as tolerated  BMI 18.7.  At risk for protein-calorie malnutrition.  -  Start dietary supplements -  Liberalize diet  Diet:  Regular Access:  PIV IVF:  OFF Proph:  lovenox  Code Status: full code Family Communication: spoke with patient alone Disposition Plan: pending improvement in dyspnea so patient able to complete ADLs, likely to home in a few days.  Ambulate today   Consultants:  none  Procedures:  none  Antibiotics:  Levofloxacin 3/1 >>   HPI/Subjective:  States that she feels better tdoay.  She is less SOB at rest but still SOB with transfer to the toilet.  Does better with exertion when oxygen increased to 4L, which she does at home also.  Denies HA, CP, N/V/D/C, dysuria.  Slept well last night.    Objective: Filed Vitals:   12/30/12 0459 12/30/12 0611 12/30/12 0736 12/30/12 1300  BP:  167/86  138/78  Pulse:  96  85  Temp:   98 F (36.7 C)  98.1 F (36.7 C)  TempSrc:  Oral  Oral  Resp:  22  19  Height:      Weight:      SpO2: 100% 99% 98% 99%    Intake/Output Summary (Last 24 hours) at 12/30/12 1400 Last data filed at 12/30/12 0940  Gross per 24 hour  Intake    140 ml  Output   1200 ml  Net  -1060 ml   Filed Weights   12/27/12 1304  Weight: 45 kg (99 lb 3.3 oz)    Exam:   General:  Thin CF, comfortable at rest today, but starts having SCM, subcostal RTX when talking  HEENT:  NCAT, MMM  Cardiovascular:  RRR, nl S1, S2 no mrg, 2+ pulses, warm extremities  Respiratory:  Diminished BS at the bases but improved from prior, full expiratory high pitched wheeze with prolonged expiration.  No rales or rhonchi.    Abdomen:    NABS, soft, NT/ND  MSK:    Normal tone and bulk, no LEE  Neuro:   Grossly intact  Data Reviewed: Basic Metabolic Panel:  Recent Labs Lab 12/27/12 1010 12/28/12 0511 12/29/12 0430  NA 137 139 144  K 3.9 3.9 3.8  CL 99 99 106  CO2 28 34* 34*  GLUCOSE 122* 136* 140*  BUN 17 17 20   CREATININE 0.48* 0.49* 0.44*  CALCIUM 8.3* 7.8* 7.8*   Liver Function Tests: No results found for this basename: AST, ALT, ALKPHOS, BILITOT, PROT, ALBUMIN,  in the last 168 hours No results found for this basename: LIPASE, AMYLASE,  in the last 168 hours No results found for this basename: AMMONIA,  in the last 168 hours CBC:  Recent Labs Lab 12/27/12 1010 12/28/12 0511 12/29/12 0430  WBC 14.3* 7.3 11.2*  HGB 11.4* 10.4* 10.0*  HCT 37.5 34.2* 32.6*  MCV 87.6 88.1 88.8  PLT 354 307 355   Cardiac Enzymes:  Recent Labs Lab 12/27/12 1010  TROPONINI <0.30   BNP (last 3 results)  Recent Labs  12/27/12 1010  PROBNP 126.4*   CBG: No results found for this basename: GLUCAP,  in the last 168 hours  No results found for this or any previous visit (from the past 240 hour(s)).   Studies: No results found.  Scheduled Meds: . benzonatate  200 mg Oral BID  . budesonide  (PULMICORT) nebulizer solution  0.5 mg Nebulization BID  . calcium-vitamin D  1 tablet Oral BID  . clonazePAM  0.5 mg Oral BID  . enoxaparin (LOVENOX) injection  40 mg Subcutaneous Q24H  . feeding supplement  1 Container Oral TID BM  . ipratropium  0.5 mg Nebulization Q6H  . levalbuterol  0.63 mg Nebulization Q6H  . levofloxacin  750 mg Intravenous Q24H  . methylPREDNISolone (SOLU-MEDROL) injection  40 mg Intravenous Q12H  . mirtazapine  15 mg Oral QHS  . pantoprazole  40 mg Oral Q1200  . sodium chloride  3 mL Intravenous Q12H   Continuous Infusions:    Principal Problem:   Acute-on-chronic respiratory failure Active Problems:   ANXIETY   HYPERTENSION, BENIGN ESSENTIAL   COPD with acute exacerbation   COPD, severe   Normocytic anemia   Urinary frequency    Time spent: 30 min    SHORT, [facility]  [facility] Pager [phone]. If 7PM-7AM, please contact night-coverage at [email or web address], password [facility] 12/30/2012, 2:00 PM  LOS: 3 days

## 2012-12-30 NOTE — Care Management Note (Signed)
    Page 1 of 1   01/01/2013     11:09:13 AM   CARE MANAGEMENT NOTE 01/01/2013  Patient:  [name],[name]   Account Number:  [id number]  Date Initiated:  12/28/2012  Documentation initiated by:  [facility]  Subjective/Objective Assessment:   72 year old female admitted with COPD exacerbation.     Action/Plan:   From home, has home oxygen.   Anticipated DC Date:  01/01/2013   Anticipated DC Plan:  HOME/SELF CARE      DC Planning Services  CM consult      Choice offered to / List presented to:             Status of service:  Completed, signed off Medicare Important Message given?  NA - LOS <3 / Initial given by admissions (If response is "NO", the following Medicare IM given date fields will be blank) Date Medicare IM given:   Date Additional Medicare IM given:    Discharge Disposition:  HOME/SELF CARE  Per UR Regulation:  Reviewed for med. necessity/level of care/duration of stay  If discussed at Long Length of Stay Meetings, dates discussed:    Comments:  01/01/13 KATHY MAHABIR RN,BSN NCM [phone] NO D/C NEEDS.  12/31/12 KATHY MAHABIR RN,BSN NCM [phone] WEANING STEROIDS.NO ANTICIPATED D/C NEEDS.HOME 02 VIA [facility]. 12/30/12 KATHY MAHABIR RN,BSN NCM [phone] IMPROVING,WEANING STEROIDS,HOME 02 VIA [facility],HAS TRAVEL TANK.D/C PLAN HOME WHEN MED STABLE.

## 2012-12-30 NOTE — Progress Notes (Signed)
I paid a social visit. She is improving well from her AECOPD with IV solumedrol & bronchodilators. Can ambulate today, taper to 40 q 12h x 2h , then PO steroids with slow taper over 2 weeks. Out pt FU can be with [place] within 1-2 weeks of discharge at the [place] office.  ALVA,RAKESH V.

## 2012-12-31 DIAGNOSIS — R634 Abnormal weight loss: Secondary | ICD-10-CM

## 2012-12-31 MED ORDER — SALINE SPRAY 0.65 % NA SOLN
1.0000 | NASAL | Status: DC | PRN
  Filled 2012-12-31: qty 44

## 2012-12-31 MED ORDER — LEVOFLOXACIN 750 MG PO TABS
750.0000 mg | ORAL_TABLET | Freq: Every day | ORAL | Status: DC
  Administered 2013-01-01: 750 mg via ORAL
  Filled 2012-12-31: qty 1

## 2012-12-31 NOTE — Progress Notes (Signed)
PULMONARY  / CRITICAL CARE MEDICINE  Name: [name] MRN: [id number] DOB: 27-Jun-1941    ADMISSION DATE:  12/27/2012 CONSULTATION DATE:  3/4  REFERRING MD :  [facility]  CHIEF COMPLAINT:  Refractory dyspnea  BRIEF PATIENT DESCRIPTION: 72 yo with severe copd , O2 dependent x 5 years, AECOPD ? recent viral syndrome, refractory to OPT.  In retrospect over 2 months prior to adm has increased saba hfa and for at least several days pta was using neb as well s relief assoc with marked anxiety and congested cough.          CULTURES: none  ANTIBIOTICS: 3/1 levaquin>>    SUBJECTIVE:  NAD feels better but at rest on increase 02 over baseline   VITAL SIGNS: Temp:  [97.5 F (36.4 C)-98.2 F (36.8 C)] 97.5 F (36.4 C) (03/05 0500) Pulse Rate:  [78-105] 78 (03/05 0500) Resp:  [18-20] 18 (03/05 0500) BP: (150-166)/(78-97) 150/78 mmHg (03/05 0500) SpO2:  [96 %-100 %] 98 % (03/05 0748) FIO2  4-6 lpm  PHYSICAL EXAMINATION: General:  Frail WF NAD atrest  Neuro:  Intact HEENT:  No LAN Neck:  No JVD Cardiovascular:  HSR RRR Lungs:  Decreased bs thr out with markied increased texp and end exp wheeze Abdomen:  + bs Musculoskeletal:  intact Skin:  warm   Recent Labs Lab 12/27/12 1010 12/28/12 0511 12/29/12 0430  NA 137 139 144  K 3.9 3.9 3.8  CL 99 99 106  CO2 28 34* 34*  BUN 17 17 20   CREATININE 0.48* 0.49* 0.44*  GLUCOSE 122* 136* 140*    Recent Labs Lab 12/27/12 1010 12/28/12 0511 12/29/12 0430  HGB 11.4* 10.4* 10.0*  HCT 37.5 34.2* 32.6*  WBC 14.3* 7.3 11.2*  PLT 354 307 355   No results found.  ASSESSMENT / PLAN:  AECOPD in setting of severe O2 dependent . Recent viral syndrome tahat was refractory to OPT TX. She is improving nicely under the excellent care of Dr. Malachi Bonds and colleagues. Very little for PCCM to offer at this point. Plan: -Continue current interventions but may be that she needs performist/budesonide as maint rx at this point instead of  hfa symbicort and anxiolysis might help as well -Have her follow up with Dr. Vassie Loll as an OPT. Call [phone](direct office number) to schedule. -Pccm available PRN    Sandrea Hughs, MD Pulmonary and Critical Care Medicine [facility] Cell [phone] After 5:30 PM or weekends, call [phone]

## 2012-12-31 NOTE — Progress Notes (Signed)
[facility] PROGRESS NOTE  [name] AVW:[id number] DOB: 1941/04/07 DOA: 12/27/2012 PCP: Danise Edge, MD  Assessment/Plan:  Acute on chronic hypoxic resp failure due to acute on chronic COPD exacerbation, gold stage 4 on 3L home O2 at baseline.  - Appreciate pulmonology assistance  - Continue to wean solumedrol will transition to oral regimen once patient ready for discharge - Ipra/xop q6h with xop q2h prn  - day 5 levofloxacin  - flu pcr negative  - On benzo, and has not tolerated morphine/narcotics well in the past  - Continue budesonide  - Pt on max PPI  - Denies sinus symptoms   HTN, mildly elevated blood pressures likely due to stress and steroids. V  - Add norvasc (avoid BB and ACEI in setting of severe COPD)  - Continue hydralazine PRN - continue to monitor blood pressures  Anxiety and dyspnea improving  - Continue clonazepam 0.5 mg BID and titrate as needed   Mild hyperglycemia, likely due to steroids  - As steroid weaned blood glucose levels are improving  BMI 18.7. At risk for protein-calorie malnutrition.  - Continue dietary supplements  - Liberalize diet   Code Status: full Family Communication: no family at bedside Disposition Plan: Pending further improvement in condition.  Will need f/u with pulmonologist as outpatient.   Consultants:  Pulmonology: Dr. Sherene Sires  Procedures:  none  Antibiotics:  Levaquin  HPI/Subjective: No new complaints.  Feeling slightly better  Objective: Filed Vitals:   12/31/12 0500 12/31/12 0748 12/31/12 1421 12/31/12 1431  BP: 150/78   150/85  Pulse: 78   101  Temp: 97.5 F (36.4 C)   98.2 F (36.8 C)  TempSrc: Oral   Oral  Resp: 18   20  Height:      Weight:      SpO2: 100% 98% 95% 97%    Intake/Output Summary (Last 24 hours) at 12/31/12 1609 Last data filed at 12/31/12 1300  Gross per 24 hour  Intake    690 ml  Output    475 ml  Net    215 ml   Filed Weights   12/27/12 1304  Weight: 45 kg  (99 lb 3.3 oz)    Exam:   General:  Pt in NAD, Alert and Awake  Cardiovascular: RRR, No MRG  Respiratory: Wheezes with expiration BL, prolonged expiratory wheezes  Abdomen: soft, NT, ND  Musculoskeletal: no cyanosis or clubbing   Data Reviewed: Basic Metabolic Panel:  Recent Labs Lab 12/27/12 1010 12/28/12 0511 12/29/12 0430  NA 137 139 144  K 3.9 3.9 3.8  CL 99 99 106  CO2 28 34* 34*  GLUCOSE 122* 136* 140*  BUN 17 17 20   CREATININE 0.48* 0.49* 0.44*  CALCIUM 8.3* 7.8* 7.8*   Liver Function Tests: No results found for this basename: AST, ALT, ALKPHOS, BILITOT, PROT, ALBUMIN,  in the last 168 hours No results found for this basename: LIPASE, AMYLASE,  in the last 168 hours No results found for this basename: AMMONIA,  in the last 168 hours CBC:  Recent Labs Lab 12/27/12 1010 12/28/12 0511 12/29/12 0430  WBC 14.3* 7.3 11.2*  HGB 11.4* 10.4* 10.0*  HCT 37.5 34.2* 32.6*  MCV 87.6 88.1 88.8  PLT 354 307 355   Cardiac Enzymes:  Recent Labs Lab 12/27/12 1010  TROPONINI <0.30   BNP (last 3 results)  Recent Labs  12/27/12 1010  PROBNP 126.4*   CBG: No results found for this basename: GLUCAP,  in the last 168  hours  No results found for this or any previous visit (from the past 240 hour(s)).   Studies: No results found.  Scheduled Meds: . benzonatate  200 mg Oral BID  . budesonide (PULMICORT) nebulizer solution  0.5 mg Nebulization BID  . calcium-vitamin D  1 tablet Oral BID  . clonazePAM  0.5 mg Oral BID  . enoxaparin (LOVENOX) injection  40 mg Subcutaneous Q24H  . feeding supplement  1 Container Oral TID BM  . ipratropium  0.5 mg Nebulization Q6H  . levalbuterol  0.63 mg Nebulization Q6H  . [START ON 01/01/2013] levofloxacin  750 mg Oral Daily  . methylPREDNISolone (SOLU-MEDROL) injection  40 mg Intravenous Q12H  . mirtazapine  15 mg Oral QHS  . pantoprazole  40 mg Oral Q1200  . sodium chloride  3 mL Intravenous Q12H   Continuous  Infusions:   Principal Problem:   Acute-on-chronic respiratory failure Active Problems:   ANXIETY   HYPERTENSION, BENIGN ESSENTIAL   COPD with acute exacerbation   COPD, severe   Normocytic anemia   Urinary frequency    Time spent: > 35 minutes    Penny Pia  [facility] Pager [phone] If 7PM-7AM, please contact night-coverage at [email or web address], password [facility] 12/31/2012, 4:09 PM  LOS: 4 days

## 2012-12-31 NOTE — Progress Notes (Signed)
PHARMACIST - PHYSICIAN COMMUNICATION DR:   Cena Benton CONCERNING: Antibiotic IV to Oral Route Change Policy  RECOMMENDATION: This patient is receiving Levaquin by the intravenous route.  Based on criteria approved by the Pharmacy and Therapeutics Committee, the antibiotic(s) is/are being converted to the equivalent oral dose form(s).  DESCRIPTION: These criteria include:   Patient being treated for a respiratory tract infection, urinary tract infection, or cellulitis  The patient is not neutropenic and does not exhibit a GI malabsorption state  The patient is eating (either orally or via tube) and/or has been taking other orally administered medications for a least 24 hours  The patient is improving clinically and has a Tmax < 100.5  If you have questions about this conversion, please contact the Pharmacy Department  []   [phone] )  [facility] []   [phone] )  [facility]  []   [phone] )  [facility] [x]   [phone] )  [facility]    Thanks! [name] Hoehn, PharmD 12/31/2012 1:32 PM  Pager: [phone]

## 2012-12-31 NOTE — Consult Note (Signed)
PULMONARY  / CRITICAL CARE MEDICINE  Name: [name] MRN: [id number] DOB: [birth year]    ADMISSION DATE:  12/27/2012 CONSULTATION DATE:  3/4  REFERRING MD :  [facility]  CHIEF COMPLAINT:  Refractory dyspnea  BRIEF PATIENT DESCRIPTION: 72 yo with severe copd , O2 dependent x 5 years, AECOPD , recent viral syndrome, refractory to OPT.  SIGNIFICANT EVENTS / STUDIES:    LINES / TUBES:   CULTURES: none  ANTIBIOTICS: 3/1 levaquin>>    SUBJECTIVE:  NAD feels better 3/5 VITAL SIGNS: Temp:  [97.5 F (36.4 C)-98.2 F (36.8 C)] 97.5 F (36.4 C) (03/05 0500) Pulse Rate:  [78-105] 78 (03/05 0500) Resp:  [18-20] 18 (03/05 0500) BP: (138-166)/(78-97) 150/78 mmHg (03/05 0500) SpO2:  [96 %-100 %] 98 % (03/05 0748)  PHYSICAL EXAMINATION: General:  Frail WF NAD atrest  Neuro:  Intact HEENT:  No LAN Neck:  No JVD Cardiovascular:  HSR RRR Lungs:  Decreased bs thr out Abdomen:  + bs Musculoskeletal:  intact Skin:  warm   Recent Labs Lab 12/27/12 1010 12/28/12 0511 12/29/12 0430  NA 137 139 144  K 3.9 3.9 3.8  CL 99 99 106  CO2 28 34* 34*  BUN 17 17 20   CREATININE 0.48* 0.49* 0.44*  GLUCOSE 122* 136* 140*    Recent Labs Lab 12/27/12 1010 12/28/12 0511 12/29/12 0430  HGB 11.4* 10.4* 10.0*  HCT 37.5 34.2* 32.6*  WBC 14.3* 7.3 11.2*  PLT 354 307 355   No results found.  ASSESSMENT / PLAN:  AECOPD in setting of severe O2 dependent . Recent viral syndrome tahat was refractory to OPT TX. She is improving nicely under the excellent care of Dr. Malachi Bonds and colleagues. Very little for PCCM to offer at this point. Plan: -Continue current interventions -add flutter valve for poor mucociliary clearance -adjusted BD's  -Have her follow up with Dr. Vassie Loll as an OPT. Call [phone](direct office number) to schedule. -3/5 pccm available PRN       [facility] ACNP Adolph Pollack PCCM Pager [phone] till 3 pm If no answer page [phone] 12/31/2012, 10:11 AM

## 2013-01-01 DIAGNOSIS — J440 Chronic obstructive pulmonary disease with acute lower respiratory infection: Secondary | ICD-10-CM

## 2013-01-01 DIAGNOSIS — J9620 Acute and chronic respiratory failure, unspecified whether with hypoxia or hypercapnia: Secondary | ICD-10-CM

## 2013-01-01 MED ORDER — BENZONATATE 200 MG PO CAPS: 200.0000 mg | ORAL_CAPSULE | Freq: Two times a day (BID) | ORAL | Status: DC

## 2013-01-01 MED ORDER — SODIUM CHLORIDE 0.9 % IJ SOLN
3.0000 mL | INTRAMUSCULAR | Status: DC | PRN
  Administered 2013-01-01: 3 mL via INTRAVENOUS

## 2013-01-01 MED ORDER — CHLORHEXIDINE GLUCONATE 0.12 % MT SOLN
15.0000 mL | Freq: Two times a day (BID) | OROMUCOSAL | Status: DC
  Filled 2013-01-01 (×3): qty 15

## 2013-01-01 MED ORDER — PREDNISONE 10 MG PO TABS: ORAL_TABLET | ORAL | Status: DC

## 2013-01-01 MED ORDER — LEVALBUTEROL HCL 0.63 MG/3ML IN NEBU
0.6300 mg | INHALATION_SOLUTION | Freq: Three times a day (TID) | RESPIRATORY_TRACT | Status: DC
  Administered 2013-01-01: 0.63 mg via RESPIRATORY_TRACT
  Filled 2013-01-01 (×4): qty 3

## 2013-01-01 MED ORDER — ENSURE PUDDING PO PUDG: 1.0000 | Freq: Three times a day (TID) | ORAL | Status: DC

## 2013-01-01 MED ORDER — BIOTENE DRY MOUTH MT LIQD: 15.0000 mL | Freq: Two times a day (BID) | OROMUCOSAL | Status: DC

## 2013-01-01 MED ORDER — IPRATROPIUM BROMIDE 0.02 % IN SOLN
0.5000 mg | Freq: Three times a day (TID) | RESPIRATORY_TRACT | Status: DC
  Administered 2013-01-01: 0.5 mg via RESPIRATORY_TRACT
  Filled 2013-01-01: qty 2.5

## 2013-01-01 NOTE — Discharge Summary (Signed)
Physician Discharge Summary  [name] AVW:[id number] DOB: 30-May-1941 DOA: 12/27/2012  PCP: Danise Edge, MD  Admit date: 12/27/2012 Discharge date: 01/01/2013  Time spent: 40 minutes  Recommendations for Outpatient Follow-up:  1. Please be sure to follow up with your pulmonologist. I have called the office so that they can contact the patient to set up a post discharge follow up appointment with Dr. Vassie Loll 2. Also be sure to check blood sugars  Discharge Diagnoses:  Principal Problem:   Acute-on-chronic respiratory failure Active Problems:   ANXIETY   HYPERTENSION, BENIGN ESSENTIAL   COPD with acute exacerbation   COPD, severe   Normocytic anemia   Urinary frequency   Discharge Condition: stable  Diet recommendation: Regular diet  Filed Weights   12/27/12 1304  Weight: 45 kg (99 lb 3.3 oz)    History of present illness:  72 y/o CF with strong smoking history (currently not smoking), with h/o GOLD stage IV COPD with FEV1 of 26% on 3 L home supplemental oxygen who presented to the ED with worsening SOB and DOE.  Hospital Course:  Acute on chronic hypoxic resp failure due to acute on chronic COPD exacerbation, gold stage 4 on 3L home O2 at baseline.  - Patient was seen while in house by pulmonology.  I have contacted the pulmonologist office for post discharge follow up. - Will discharge patient on prednisone taper. - patient to continue home regimen for her COPD - patient completed 5 days of levofloxacin  - flu pcr negative  - On benzo, and has not tolerated morphine/narcotics well in the past   HTN, mildly elevated blood pressures likely due to stress and steroids. V  - Add norvasc (avoid BB and ACEI in setting of severe COPD)  - Continue hydralazine PRN  - continue to monitor blood pressures   Anxiety and dyspnea improving  - Continue clonazepam 0.5 mg BID and titrate as needed   Mild hyperglycemia, likely due to steroids  - As steroid weaned blood glucose  levels are improving   BMI 18.7. At risk for protein-calorie malnutrition.  - Continue dietary supplements  - agree with regular diet at this juncture.   Procedures:  none  Consultations:  Dr. Sherene Sires (pulmonologist)  Discharge Exam: Filed Vitals:   12/31/12 2023 12/31/12 2315 01/01/13 0510 01/01/13 0736  BP:  123/69 163/86   Pulse:  93 83   Temp:  97.5 F (36.4 C) 98.4 F (36.9 C)   TempSrc:  Oral Oral   Resp:  18    Height:      Weight:      SpO2: 97% 99% 99% 93%    General: pt in nad, alert and awake Cardiovascular: RRR, No MRG Respiratory: no wheezes, breath sounds BL, prolonged expiratory phase, nasal canula in place with supplemental oxygen going at 4 L  Discharge Instructions   Future Appointments Provider Department Dept Phone   01/20/2013 11:30 AM Julio Sicks, NP [facility] [place] [phone]   06/01/2013 10:30 AM Bradd Canary, MD [facility] at  [facility] [phone]       Medication List    STOP taking these medications       amoxicillin-clavulanate 875-125 MG per tablet  Commonly known as:  AUGMENTIN     dextromethorphan-guaiFENesin 30-600 MG per 12 hr tablet  Commonly known as:  MUCINEX DM     naproxen sodium 220 MG tablet  Commonly known as:  ANAPROX      TAKE these  medications       albuterol (2.5 MG/3ML) 0.083% nebulizer solution  Commonly known as:  PROVENTIL  Take 2.5 mg by nebulization every 4 (four) hours as needed for wheezing or shortness of breath. Every 4 -6 hours as needed.     albuterol 108 (90 BASE) MCG/ACT inhaler  Commonly known as:  PROVENTIL HFA;VENTOLIN HFA  Inhale 2 puffs into the lungs every 6 (six) hours as needed for wheezing or shortness of breath.     ALPRAZolam 0.5 MG tablet  Commonly known as:  XANAX  Take 0.5-1 mg by mouth 2 (two) times daily. Take 1 tablet by mouth every morning and take 2 tablets by mouth at bedtime.     benzonatate 200 MG capsule  Commonly known as:  TESSALON   Take 1 capsule (200 mg total) by mouth 2 (two) times daily.     budesonide-formoterol 160-4.5 MCG/ACT inhaler  Commonly known as:  SYMBICORT  Inhale 2 puffs into the lungs 2 (two) times daily.     Calcium Carbonate-Vitamin D 600-400 MG-UNIT per tablet  Take 1 tablet by mouth 2 (two) times daily.     [employer] COENZYME Q10 100 MG capsule  Generic drug:  Coenzyme Q10  Take 100 mg by mouth daily.     feeding supplement Pudg  Take 1 Container by mouth 3 (three) times daily between meals.     fexofenadine 180 MG tablet  Commonly known as:  ALLEGRA  Take 180 mg by mouth daily.     mirtazapine 15 MG tablet  Commonly known as:  REMERON  Take 15 mg by mouth at bedtime.     omeprazole-sodium bicarbonate 40-1100 MG per capsule  Commonly known as:  ZEGERID  Take 1 capsule by mouth 2 (two) times daily.     predniSONE 10 MG tablet  Commonly known as:  DELTASONE  60 mg (6 tabs) po for the next 4 days, then 50 mg (5 tabs) po for the next 4 days, then 40 mg (4 tabs) for the next 4 days, then 30 mg (3 tabs) for the next 4 days, then 20 mg (2 tabs) for the next 4 days, then 10mg  (1 tab) for the next 4 days until done.    Dispense quantity sufficient     tiotropium 18 MCG inhalation capsule  Commonly known as:  SPIRIVA  Place 1 capsule (18 mcg total) into inhaler and inhale daily.          The results of significant diagnostics from this hospitalization (including imaging, microbiology, ancillary and laboratory) are listed below for reference.    Significant Diagnostic Studies: Dg Chest 2 View  12/27/2012  *RADIOLOGY REPORT*  Clinical Data: Cough, shortness of breath, COPD.  CHEST - 2 VIEW  Comparison: 12/19/2011  Findings: There is hyperinflation of the lungs compatible with COPD.  Heart and mediastinal contours are within normal limits.  No focal opacities or effusions.  No acute bony abnormality.  IMPRESSION: COPD. No active cardiopulmonary disease.   Original Report Authenticated By:  Charlett Nose, M.D.     Microbiology: No results found for this or any previous visit (from the past 240 hour(s)).   Labs: Basic Metabolic Panel:  Recent Labs Lab 12/27/12 1010 12/28/12 0511 12/29/12 0430  NA 137 139 144  K 3.9 3.9 3.8  CL 99 99 106  CO2 28 34* 34*  GLUCOSE 122* 136* 140*  BUN 17 17 20   CREATININE 0.48* 0.49* 0.44*  CALCIUM 8.3* 7.8* 7.8*   Liver Function  Tests: No results found for this basename: AST, ALT, ALKPHOS, BILITOT, PROT, ALBUMIN,  in the last 168 hours No results found for this basename: LIPASE, AMYLASE,  in the last 168 hours No results found for this basename: AMMONIA,  in the last 168 hours CBC:  Recent Labs Lab 12/27/12 1010 12/28/12 0511 12/29/12 0430  WBC 14.3* 7.3 11.2*  HGB 11.4* 10.4* 10.0*  HCT 37.5 34.2* 32.6*  MCV 87.6 88.1 88.8  PLT 354 307 355   Cardiac Enzymes:  Recent Labs Lab 12/27/12 1010  TROPONINI <0.30   BNP: BNP (last 3 results)  Recent Labs  12/27/12 1010  PROBNP 126.4*   CBG: No results found for this basename: GLUCAP,  in the last 168 hours     Signed:  Penny Pia  [facility] 01/01/2013, 11:45 AM

## 2013-01-20 ENCOUNTER — Encounter: Payer: Self-pay | Admitting: Adult Health

## 2013-01-20 ENCOUNTER — Ambulatory Visit (INDEPENDENT_AMBULATORY_CARE_PROVIDER_SITE_OTHER): Payer: Medicare FFS | Admitting: Adult Health

## 2013-01-20 VITALS — BP 122/82 | HR 90 | Temp 97.6°F | Ht 61.0 in | Wt 102.0 lb

## 2013-01-20 DIAGNOSIS — J441 Chronic obstructive pulmonary disease with (acute) exacerbation: Secondary | ICD-10-CM

## 2013-01-20 DIAGNOSIS — J449 Chronic obstructive pulmonary disease, unspecified: Secondary | ICD-10-CM

## 2013-01-20 MED ORDER — IPRATROPIUM BROMIDE 0.02 % IN SOLN: 500.0000 ug | Freq: Four times a day (QID) | RESPIRATORY_TRACT | Status: DC

## 2013-01-20 MED ORDER — PREDNISONE 10 MG PO TABS: 10.0000 mg | ORAL_TABLET | Freq: Every day | ORAL | Status: DC

## 2013-01-20 MED ORDER — BUDESONIDE 0.25 MG/2ML IN SUSP: 0.2500 mg | Freq: Every day | RESPIRATORY_TRACT | Status: DC

## 2013-01-20 NOTE — Assessment & Plan Note (Signed)
Slow to resolve exacerbation  Pt is end stage COPD w/ Fev1 of 26% (2010) , O2 dependent  She declines in home services or hospice at this time.  Will try Nebs and chronic steroids to see if this will help to decrease exacerbations and readmissions   Plan  Remain on Prednisone 10mg  daily -hold at this dose until seen back in office  Stop Symbicort and Spiriva - once you begin nebs as below Begin Pulmicort Neb .Twice daily   Begin Ipratropium Neb Four times a day   May use Albuterol Neb every 4 hr as needed.  Flutter valve Three times a day   Mucinex DM Twice daily  As needed  Cough/congestion  Fluids and rest  Please contact office for sooner follow up if symptoms do not improve or worsen or seek emergency care  follow up Dr. Vassie Loll  In 2 weeks in [facility]

## 2013-01-20 NOTE — Progress Notes (Signed)
  Subjective:    Patient ID: [name], female    DOB: [birth year], 72 y.o.   MRN: [id number]  HPI 66 yowf , heavy ex- smoker, quit 2002 with GOLD stg IV COPD FEV1 26% 6/10 on 24 h O2 since 2009.  -completed pulm rehab oct '11  Recurrent flares 2-3/yr 2/13, 9/13 requiring steroid bursts  CXR 11/2011 -chronic changes  07/17/2012 Acute OV CAT score 28 >> z -pak, pred  12/23/2012 Acute OV  Pt c/o sore throat, chest congestion, dry cough, increased SOB, chest discomfort x 3 days. Pt took 30mg  of prednisone yesterday and today. Seems to be helping but still very tight with wheezing. No fever or body aches Cough is dry, unable to get thick mucus up .  Using Nebs Three times a day  For last 2 days -seems to help a lot .  No hemoptysis or edema.  >>augmentin x 7   01/20/2013 Post Hospital follow up  Admitted 12/27/12 for COPD exacerbation . Tx w/ Levaquin and steroid burst. CXR w/ NAD .  Does not feel a lot better since discharge. Gets winded with little activity. Does not feel inhalers are working, Hard to coordinate the inhalers .  Breathing has gotten worse since leaving hospital. Lots of chest congestion and coughing with production of green mucus. Mucus does have some green, clear and white.  Has rattling in chest -thick mucus , hard to get mucus up.  Denies chest pain or chest tightness. Has been on 2 abx in last 1 month.  Last FEV1 ~26% .  She denies any hemoptysis, chest pain or edema.   Review of Systems  Constitutional: Negative for fever and unexpected weight change.  HENT: Positive for postnasal drip. Negative for ear pain, nosebleeds, congestion, rhinorrhea, sneezing, trouble swallowing, dental problem and sinus pressure.   Eyes: Negative for redness and itching.  Respiratory: Positive for cough, chest tightness, shortness of breath and wheezing.   Cardiovascular: Negative for palpitations and leg swelling.  Gastrointestinal: Negative for nausea and vomiting.  Genitourinary:  Negative for dysuria.  Musculoskeletal: Negative for joint swelling.  Skin: Negative for rash.  Neurological: Negative for headaches.  Hematological: Does not bruise/bleed easily.  Psychiatric/Behavioral: Negative for dysphoric mood. The patient is not nervous/anxious.        Objective:   Physical Exam  Gen. Pleasant, thin woman, in no distress ENT - no lesions, no post nasal drip Neck: No JVD, no thyromegaly, no carotid bruits Lungs : diminished BS in bases , no wheezing  Cardiovascular: Rhythm regular, heart sounds  normal, no murmurs or gallops, no peripheral edema Musculoskeletal: No deformities, no cyanosis or clubbing        Assessment & Plan:

## 2013-01-20 NOTE — Patient Instructions (Addendum)
Remain on Prednisone 10mg  daily -hold at this dose until seen back in office  Stop Symbicort and Spiriva - once you begin nebs as below Begin Pulmicort Neb .Twice daily   Begin Ipratropium Neb Four times a day   May use Albuterol Neb every 4 hr as needed.  Flutter valve Three times a day   Mucinex DM Twice daily  As needed  Cough/congestion  Fluids and rest  Please contact office for sooner follow up if symptoms do not improve or worsen or seek emergency care  follow up Dr. Vassie Loll  In 2 weeks in [facility]

## 2013-01-21 MED ORDER — BUDESONIDE 0.25 MG/2ML IN SUSP: 0.2500 mg | Freq: Two times a day (BID) | RESPIRATORY_TRACT | Status: DC

## 2013-01-21 NOTE — Addendum Note (Signed)
Addended by: Boone Master E on: 01/21/2013 09:55 AM   Modules accepted: Orders

## 2013-02-03 ENCOUNTER — Encounter: Payer: Self-pay | Admitting: Pulmonary Disease

## 2013-02-03 ENCOUNTER — Ambulatory Visit (INDEPENDENT_AMBULATORY_CARE_PROVIDER_SITE_OTHER): Payer: Medicare FFS | Admitting: Pulmonary Disease

## 2013-02-03 ENCOUNTER — Ambulatory Visit: Payer: Medicare FFS

## 2013-02-03 ENCOUNTER — Telehealth: Payer: Self-pay | Admitting: *Deleted

## 2013-02-03 VITALS — BP 120/76 | HR 102 | Temp 97.6°F | Ht 60.0 in | Wt 99.5 lb

## 2013-02-03 DIAGNOSIS — J449 Chronic obstructive pulmonary disease, unspecified: Secondary | ICD-10-CM

## 2013-02-03 MED ORDER — BUDESONIDE 0.5 MG/2ML IN SUSP: 0.5000 mg | Freq: Two times a day (BID) | RESPIRATORY_TRACT | Status: DC

## 2013-02-03 MED ORDER — ARFORMOTEROL TARTRATE 15 MCG/2ML IN NEBU: 15.0000 ug | INHALATION_SOLUTION | Freq: Two times a day (BID) | RESPIRATORY_TRACT | Status: DC

## 2013-02-03 NOTE — Progress Notes (Signed)
  Subjective:    Patient ID: [name], female    DOB: 1940/11/16, 72 y.o.   MRN: [id number]  HPI 41 yowf , heavy ex- smoker, quit 2002 with GOLD stg IV COPD FEV1 26% 6/10 on 24 h O2 since 2009.  -completed pulm rehab oct '11  Recurrent flares 2-3/yr 2/13, 9/13 requiring steroid bursts  07/17/2012 CAT score 28  CXR 12/2012 chronic changes  02/03/2013 Admitted 12/27/12 for COPD exacerbation . Tx w/ Levaquin and steroid burst. CXR w/ NAD .  ABG 7.39/55/75 Does not feel a lot better since discharge. Gets winded with little activity. Does not feel inhalers are working, Hard to coordinate the inhalers .   Mucus does have some green, clear and white.  Denies chest pain or chest tightness.  Took 2 abx in last 1 month.  She denies any hemoptysis, chest pain or edema.   Breathing has finally improved. Cough is still present with production of dark green mucus. Denies chest pain, chest tightness or SOB.  Review of Systems neg for any significant sore throat, dysphagia, itching, sneezing, nasal congestion or excess/ purulent secretions, fever, chills, sweats, unintended wt loss, pleuritic or exertional cp, hempoptysis, orthopnea pnd or change in chronic leg swelling. Also denies presyncope, palpitations, heartburn, abdominal pain, nausea, vomiting, diarrhea or change in bowel or urinary habits, dysuria,hematuria, rash, arthralgias, visual complaints, headache, numbness weakness or ataxia.     Objective:   Physical Exam  Gen. Pleasant, thin woman on o2 [place], in no distress ENT - no lesions, no post nasal drip Neck: No JVD, no thyromegaly, no carotid bruits Lungs: no use of accessory muscles, no dullness to percussion, decreased without rales or rhonchi  Cardiovascular: Rhythm regular, heart sounds  normal, no murmurs or gallops, no peripheral edema Musculoskeletal: No deformities, no cyanosis or clubbing        Assessment & Plan:

## 2013-02-03 NOTE — Telephone Encounter (Signed)
Patient on nurse schedule for injection today at 4:30p for Prolia; medication not in office. Researched and found request for Prolia via [facility] faxed on 02.06.14 w/o any benefit paperwork attached. Called [facility] to inquire; stated that benefit paperwork was sent out in Feb 2014 and pt's insurance [[employer]] required prior authorization for medication [PCP's MA has not recvd those forms]; requested copy to be faxed, so we may obtain PA to get medication ordered for patient injection. Pt has Pulmonary OV today as well, and will inform upon arrival to office that we need to reschedule injection appointment/SLS

## 2013-02-03 NOTE — Assessment & Plan Note (Signed)
Decrease to 5mg   (1/2 pill) daily for April On May 1 st, drop to 5mg  M/W/F till you see Tammy Stay on your regimen of nebs - will add  Brovana twice daily  (2bs - bid) Cut down albuterol/atrovent to as needed

## 2013-02-03 NOTE — Patient Instructions (Signed)
Decrease to 5mg  (1/2 pill) daily for April On May 1 st, drop to 5mg M/W/F till you see Tammy Stay on your regimen of nebs - will add  Brovana twice daily  (2bs - bid) Cut down albuterol/atrovent to as needed 

## 2013-02-11 NOTE — Telephone Encounter (Signed)
Prolia received in the office, message left on pt's home # to call and arrange nurse visit for injection.

## 2013-02-12 ENCOUNTER — Encounter: Payer: Self-pay | Admitting: *Deleted

## 2013-02-12 NOTE — Telephone Encounter (Signed)
Pt called back and scheduled injection for 02/16/13.

## 2013-02-16 ENCOUNTER — Ambulatory Visit (INDEPENDENT_AMBULATORY_CARE_PROVIDER_SITE_OTHER): Payer: Medicare FFS | Admitting: Family Medicine

## 2013-02-16 ENCOUNTER — Telehealth: Payer: Self-pay | Admitting: Family Medicine

## 2013-02-16 ENCOUNTER — Encounter: Payer: Self-pay | Admitting: *Deleted

## 2013-02-16 DIAGNOSIS — M810 Age-related osteoporosis without current pathological fracture: Secondary | ICD-10-CM

## 2013-02-16 MED ORDER — DENOSUMAB 60 MG/ML ~~LOC~~ SOLN
60.0000 mg | Freq: Once | SUBCUTANEOUS | Status: AC
  Administered 2013-02-16: 60 mg via SUBCUTANEOUS

## 2013-02-16 NOTE — Telephone Encounter (Signed)
Patient wants to take a trip to [place].  It would take 2 days on the road to get there and she would be there a week and then 2 days back.  Should she go?  Also, she said Dr Vassie Loll said she could have prednisone 5mg  when she needs it.  She would like an RX called in to [facility] on [address] as she is about out.

## 2013-02-16 NOTE — Telephone Encounter (Signed)
lmtcb x1 for pt. 

## 2013-02-17 ENCOUNTER — Telehealth: Payer: Self-pay | Admitting: Pulmonary Disease

## 2013-02-17 MED ORDER — PREDNISONE 10 MG PO TABS: 10.0000 mg | ORAL_TABLET | Freq: Every day | ORAL | Status: DC

## 2013-02-17 NOTE — Telephone Encounter (Signed)
Will close this msg as I have created one from our office.

## 2013-02-17 NOTE — Telephone Encounter (Signed)
[name] at 02/16/2013 3:59 PM   Status: Signed            Patient wants to take a trip to [place]. It would take 2 days on the road to get there and she would be there a week and then 2 days back. Should she go?  Also, she said Dr Vassie Loll said she could have prednisone 5mg  when she needs it. She would like an RX called in to [facility] on [address] as she is about out.    Received this msg in triage 02/16/13. Spoke with patient to verify exactly what patient is requesting from Dr. Vassie Loll. States she is going to [place] and will be staying for a few days. Says she has all her oxygen needs taken care of, however will run out of prednisone once she is there and would like to pick up some prior to leaving, Pred sent to new pharmacy at [address] per patients request Patient is aware and nothing further needed at this time.

## 2013-02-18 ENCOUNTER — Encounter: Payer: Self-pay | Admitting: Internal Medicine

## 2013-02-18 NOTE — Progress Notes (Signed)
Not seen by MD

## 2013-03-03 ENCOUNTER — Encounter: Payer: Self-pay | Admitting: Internal Medicine

## 2013-03-05 ENCOUNTER — Encounter: Payer: Self-pay | Admitting: *Deleted

## 2013-03-10 ENCOUNTER — Ambulatory Visit (INDEPENDENT_AMBULATORY_CARE_PROVIDER_SITE_OTHER): Payer: Medicare FFS | Admitting: Adult Health

## 2013-03-10 ENCOUNTER — Encounter: Payer: Self-pay | Admitting: Adult Health

## 2013-03-10 VITALS — BP 110/60 | HR 85 | Temp 98.0°F | Ht 61.0 in | Wt 97.0 lb

## 2013-03-10 DIAGNOSIS — J449 Chronic obstructive pulmonary disease, unspecified: Secondary | ICD-10-CM

## 2013-03-10 MED ORDER — BENZONATATE 200 MG PO CAPS: 200.0000 mg | ORAL_CAPSULE | Freq: Three times a day (TID) | ORAL | Status: DC | PRN

## 2013-03-10 NOTE — Patient Instructions (Addendum)
Decrease Prednisone 5mg   Every other day for 2 weeks then stop.  Flutter valve As needed   Mucinex DM Twice daily  As needed  Cough/congestion  Continue on Brovana and Budesonide Neb Twice daily   Please contact office for sooner follow up if symptoms do not improve or worsen or seek emergency care  Follow up Dr. Vassie Loll  In 2 months and As needed  -in [facility]

## 2013-03-10 NOTE — Progress Notes (Signed)
  Subjective:    Patient ID: [name], female    DOB: 16-Apr-1941, 72 y.o.   MRN: [id number]  HPI  56 yowf , heavy ex- smoker, quit 2002 with GOLD stg IV COPD FEV1 26% 6/10 on 24 h O2 since 2009.  -completed pulm rehab oct '11  Recurrent flares 2-3/yr 2/13, 9/13 requiring steroid bursts  07/17/2012 CAT score 28  CXR 12/2012 chronic changes  03/05/2013  Admitted 12/27/12 for COPD exacerbation . Tx w/ Levaquin and steroid burst. CXR w/ NAD .  ABG 7.39/55/75 Does not feel a lot better since discharge. Gets winded with little activity. Does not feel inhalers are working, Hard to coordinate the inhalers .   Mucus does have some green, clear and white.  Denies chest pain or chest tightness.  Took 2 abx in last 1 month.  She denies any hemoptysis, chest pain or edema.  Breathing has finally improved. Cough is still present with production of dark green mucus. Denies chest pain, chest tightness or SOB. >pred decreased to 5mg  daily   03/10/2013 Follow up  Returns for 1 month follow up . Seen last ov with slowly resolving flare of COPD from hospital admit 12/27/12  Her prednisone was decreased to 5mg  daily . Has tolerated dose reduction with flare in cough or wheezing.  Feels she has returned to her baseline .  She has several questions concerning a COPD study involving >Lung implantable coils  Wants to look into getting into this study at [facility] .  We talked about the paperwork and will look into further info as available. -discuss with Dr. Vassie Loll   She denies chest pain, wt loss, hemoptysis, edema or orthopnea.   Last CXR 12/27/12 w/ COPD changes, NAD   Review of Systems  neg for any significant sore throat, dysphagia, itching, sneezing, nasal congestion or excess/ purulent secretions, fever, chills, sweats, unintended wt loss, pleuritic or exertional cp, hempoptysis, orthopnea pnd or change in chronic leg swelling. Also denies presyncope, palpitations, heartburn, abdominal pain, nausea, vomiting,  diarrhea or change in bowel or urinary habits, dysuria,hematuria, rash, arthralgias, visual complaints, headache, numbness weakness or ataxia.     Objective:   Physical Exam   Gen. Pleasant, thin woman on o2 [place], in no distress ENT - no lesions, no post nasal drip Neck: No JVD, no thyromegaly, no carotid bruits Lungs: no use of accessory muscles, no dullness to percussion, decreased without rales or rhonchi  Cardiovascular: Rhythm regular, heart sounds  normal, no murmurs or gallops, no peripheral edema Musculoskeletal: No deformities, no cyanosis or clubbing        Assessment & Plan:

## 2013-03-12 NOTE — Assessment & Plan Note (Signed)
Recent flare now resolving, returning to baseline  Will attempt to taper off steroids completely   Plan  Decrease Prednisone 5mg   Every other day for 2 weeks then stop.  Flutter valve As needed   Mucinex DM Twice daily  As needed  Cough/congestion  Continue on Brovana and Budesonide Neb Twice daily   Please contact office for sooner follow up if symptoms do not improve or worsen or seek emergency care  Follow up Dr. Vassie Loll  In 2 months and As needed  -in [facility]

## 2013-03-24 ENCOUNTER — Telehealth: Payer: Self-pay | Admitting: Pulmonary Disease

## 2013-03-24 NOTE — Telephone Encounter (Signed)
Advised that she will need to contact medical records for pt records. Number provided. Carron Curie, CMA

## 2013-03-31 ENCOUNTER — Ambulatory Visit: Payer: Medicare FFS | Admitting: Adult Health

## 2013-04-13 ENCOUNTER — Telehealth: Payer: Self-pay | Admitting: Pulmonary Disease

## 2013-04-13 NOTE — Telephone Encounter (Signed)
I spoke with pt. I scheduled pt appt to see Tp Iin HP tomorrow. Nothing further was needed

## 2013-04-14 ENCOUNTER — Encounter: Payer: Self-pay | Admitting: Adult Health

## 2013-04-14 ENCOUNTER — Ambulatory Visit (INDEPENDENT_AMBULATORY_CARE_PROVIDER_SITE_OTHER): Payer: Medicare FFS | Admitting: Adult Health

## 2013-04-14 VITALS — BP 114/62 | HR 95 | Temp 98.2°F | Ht 61.0 in | Wt 96.0 lb

## 2013-04-14 DIAGNOSIS — J441 Chronic obstructive pulmonary disease with (acute) exacerbation: Secondary | ICD-10-CM

## 2013-04-14 MED ORDER — DOXYCYCLINE HYCLATE 100 MG PO TABS: 100.0000 mg | ORAL_TABLET | Freq: Two times a day (BID) | ORAL | Status: DC

## 2013-04-14 MED ORDER — PREDNISONE 10 MG PO TABS: ORAL_TABLET | ORAL | Status: DC

## 2013-04-14 NOTE — Assessment & Plan Note (Signed)
Mild flare -does not appear to be in fluid overload Suspect most of symptoms are due to tapering off steroids  And ? medication issues. (off Brovana).   Plan  Doxycycline 100mg  Twice daily  For 7 days  Mucinex DM Twice daily  As needed  .cough/congestion Fluids and rest.  Restart Prednisone 10mg  daily for 1 week then 5mg  daily -hold at this dose.  Restart Budesonide 0.25mg  Neb Twice daily  (verified rx w/  [facility])  Restart Brovana Neb Twice daily  (check with [facility] on rx refills )  Continue on Ipratropium Neb Three times a day  (verified rx w/ [facility] )  May use Albuterol Neb every 4hr as needed.  Please contact office for sooner follow up if symptoms do not improve or worsen or seek emergency care  Follow up as planned next month  and as needed

## 2013-04-14 NOTE — Patient Instructions (Addendum)
Doxycycline 100mg  Twice daily  For 7 days  Mucinex DM Twice daily  As needed  .cough/congestion Fluids and rest.  Restart Prednisone 10mg  daily for 1 week then 5mg  daily -hold at this dose.  Restart Budesonide 0.25mg  Neb Twice daily   Restart Brovana Neb Twice daily   Continue on Ipratropium Neb Three times a day   May use Albuterol Neb every 4hr as needed.  Please contact office for sooner follow up if symptoms do not improve or worsen or seek emergency care  Follow up as planned next month  and as needed

## 2013-04-14 NOTE — Progress Notes (Signed)
  Subjective:    Patient ID: [name], female    DOB: 06/15/1941, 72 y.o.   MRN: [id number]  HPI  76 yowf , heavy ex- smoker, quit 2002 with GOLD stg IV COPD FEV1 26% 6/10 on 24 h O2 since 2009.  -completed pulm rehab oct '11  Recurrent flares 2-3/yr 2/13, 9/13 requiring steroid bursts  07/17/2012 CAT score 28  CXR 12/2012 chronic changes  03/05/2013  Admitted 12/27/12 for COPD exacerbation . Tx w/ Levaquin and steroid burst. CXR w/ NAD .  ABG 7.39/55/75 Does not feel a lot better since discharge. Gets winded with little activity. Does not feel inhalers are working, Hard to coordinate the inhalers .   Mucus does have some green, clear and white.  Denies chest pain or chest tightness.  Took 2 abx in last 1 month.  She denies any hemoptysis, chest pain or edema.  Breathing has finally improved. Cough is still present with production of dark green mucus. Denies chest pain, chest tightness or SOB. >pred decreased to 5mg  daily   03/10/13  Follow up  Returns for 1 month follow up . Seen last ov with slowly resolving flare of COPD from hospital admit 12/27/12  Her prednisone was decreased to 5mg  daily . Has tolerated dose reduction with flare in cough or wheezing.  Feels she has returned to her baseline .  She has several questions concerning a COPD study involving >Lung implantable coils  Wants to look into getting into this study at [facility] .  We talked about the paperwork and will look into further info as available. -discuss with Dr. Vassie Loll   She denies chest pain, wt loss, hemoptysis, edema or orthopnea.   Last CXR 12/27/12 w/ COPD changes, NAD >tapered off steroids.   04/14/2013 Acute OV  Complains of more SOB x 5-6 days. She is having aches all over her body, swelling in her ankles on the inside x 5 days. Her toes feel swollen. C/o dry cough except yesterday she did cough up yellow-green phlem but none since, chest heaviness. She is using 3 liters 02 24/7 Has run out of brovana. Mistakenly  using 2 pulmicort instead.  Weight is down 1 lb over last month.  Tapered off prednisone 2 weeks ago.  No chest pain, orthopnea, fever, n/v, rash, hemoptysis .    Review of Systems  neg for any significant sore throat, dysphagia, itching, sneezing, nasal congestion or excess/ purulent secretions, fever, chills, sweats, unintended wt loss, pleuritic or exertional cp, hempoptysis, orthopnea pnd or change in chronic leg swelling. Also denies presyncope, palpitations, heartburn, abdominal pain, nausea, vomiting, diarrhea or change in bowel or urinary habits, dysuria,hematuria, rash, arthralgias, visual complaints, headache, numbness weakness or ataxia.     Objective:   Physical Exam   Gen. Pleasant, thin woman on o2 [place], in no distress ENT - no lesions, no post nasal drip Neck: No JVD, no thyromegaly, no carotid bruits Lungs: no use of accessory muscles, no dullness to percussion, decreased BS in bases without rales or rhonchi  Cardiovascular: Rhythm regular, heart sounds  normal, no murmurs or gallops, no-tr  peripheral edema  Musculoskeletal: No deformities, no cyanosis or clubbing        Assessment & Plan:

## 2013-04-23 ENCOUNTER — Other Ambulatory Visit: Payer: Self-pay | Admitting: Family Medicine

## 2013-04-23 DIAGNOSIS — Z1231 Encounter for screening mammogram for malignant neoplasm of breast: Secondary | ICD-10-CM

## 2013-04-30 ENCOUNTER — Ambulatory Visit (HOSPITAL_COMMUNITY)
Admission: RE | Admit: 2013-04-30 | Discharge: 2013-04-30 | Disposition: A | Discharge status: Home / Self Care | Payer: Medicare FFS | Source: Ambulatory Visit | Attending: Family Medicine | Admitting: Family Medicine

## 2013-04-30 DIAGNOSIS — Z1231 Encounter for screening mammogram for malignant neoplasm of breast: Secondary | ICD-10-CM | POA: Insufficient documentation

## 2013-05-05 ENCOUNTER — Ambulatory Visit (INDEPENDENT_AMBULATORY_CARE_PROVIDER_SITE_OTHER): Payer: Medicare FFS | Admitting: Internal Medicine

## 2013-05-05 ENCOUNTER — Encounter: Payer: Self-pay | Admitting: Internal Medicine

## 2013-05-05 VITALS — BP 100/74 | HR 72 | Ht 61.0 in | Wt 96.2 lb

## 2013-05-05 DIAGNOSIS — J441 Chronic obstructive pulmonary disease with (acute) exacerbation: Secondary | ICD-10-CM

## 2013-05-05 DIAGNOSIS — Z86010 Personal history of colonic polyps: Secondary | ICD-10-CM

## 2013-05-05 MED ORDER — MOVIPREP 100 G PO SOLR: 1.0000 | Freq: Once | ORAL | Status: DC

## 2013-05-05 NOTE — Patient Instructions (Addendum)
You have been scheduled for a colonoscopy with propofol. Please follow written instructions given to you at your visit today.  Please pick up your prep kit at the pharmacy within the next 1-3 days. If you use inhalers (even only as needed), please bring them with you on the day of your procedure. Your physician has requested that you go to www.startemmi.com and enter the access code given to you at your visit today. This web site gives a general overview about your procedure. However, you should still follow specific instructions given to you by our office regarding your preparation for the procedure.  CC: Dr Danise Edge

## 2013-05-05 NOTE — Progress Notes (Signed)
[name] [birth year] MRN [id number]  History of Present Illness:  This is a 72 year old white female who is due for a surveillance colonoscopy. She had a multilobulated adenomatous polyp removed on colonoscopy in 2009 and again had multiple  adenomatous polyps on a repeat colonoscopy in May 2011. At least 5 polyps were removed. She had a paternal aunt with colon cancer. Patient has severe COPD with an FEV1 26%. She has been on continuous oxygen at 3 L per minute since 2009. She had a recent exacerbation and hospitalization in April 2014 but is back to her baseline. She is down to 5 mg of prednisone a day which is her stable dose. She would like to go ahead with a colonoscopy because of her risks of colon cancer.   Past Medical History  Diagnosis Date  . COPD (chronic obstructive pulmonary disease)   . Thyroid disease   . Glucose intolerance (impaired glucose tolerance)   . History of small bowel obstruction   . Arthralgia   . Dyspnea   . Pallor   . Chest pain   . Osteoporosis   . Dysuria   . Weight loss   . Edema   . Weakness   . Anxiety   . Depression   . Migraine   . Hypertension   . Leukocytosis   . GERD (gastroesophageal reflux disease)   . Atrophic vaginitis 12/07/2012  . Hx of adenomatous colonic polyps   . Ischemic colitis    Past Surgical History  Procedure Laterality Date  . Cholecystectomy    . Abdominal hysterectomy  1993  . Tubal ligation    . Bladder repair      reports that she quit smoking about 11 years ago. Her smoking use included Cigarettes. She has a 80 pack-year smoking history. She has never used smokeless tobacco. She reports that  drinks alcohol. She reports that she does not use illicit drugs. family history includes Alcohol abuse in her mother; COPD in her brother; Colon cancer in her paternal aunt; Heart disease in her mother; Hypertension in her mother; Kidney disease in her mother; Prostate cancer in an unspecified family member; and Stroke  in her mother. Allergies  Allergen Reactions  . Morphine     REACTION: nightmares        Review of Systems: Denies dysphagia heartburn positive for shortness of breath  The remainder of the 10 point ROS is negative except as outlined in H&P   Physical Exam: General appearance  thin, in no distress., On portable oxygen 3 L a minute Eyes- non icteric. HEENT nontraumatic, normocephalic. Mouth no lesions, tongue papillated, no cheilosis. Neck supple without adenopathy, thyroid not enlarged, no carotid bruits, no JVD. Lungs Clear to auscultation bilaterally. Decreased breath sounds Cor normal S1, normal S2, regular rhythm, no murmur,  quiet precordium. Abdomen: Soft nontender. Rectal: Not done. Extremities no pedal edema. Skin no lesions. Neurological alert and oriented x 3. Psychological normal mood and affect.  Assessment and Plan:  Problem #51 72 year old white female who is due for repeat colonoscopy because of a history of multiple adenomatous polyps on the last 2 colonoscopies. She is at high risk for conscious sedation due to her severe respiratory disease. We will go ahead with scheduling a colonoscopy with propofol in the hospital setting. I have discussed the prep and sedation as well as the risks and she would like to go ahead and schedule it at her earliest convenience.   05/05/2013 [name]

## 2013-05-06 ENCOUNTER — Encounter: Payer: Self-pay | Admitting: Internal Medicine

## 2013-05-12 ENCOUNTER — Ambulatory Visit (INDEPENDENT_AMBULATORY_CARE_PROVIDER_SITE_OTHER): Payer: Medicare FFS | Admitting: Pulmonary Disease

## 2013-05-12 ENCOUNTER — Encounter: Payer: Self-pay | Admitting: Pulmonary Disease

## 2013-05-12 VITALS — BP 122/74 | HR 93 | Ht 61.0 in | Wt 96.0 lb

## 2013-05-12 DIAGNOSIS — J449 Chronic obstructive pulmonary disease, unspecified: Secondary | ICD-10-CM

## 2013-05-12 DIAGNOSIS — J4489 Other specified chronic obstructive pulmonary disease: Secondary | ICD-10-CM

## 2013-05-12 NOTE — Patient Instructions (Addendum)
In August, decrease prednisone to 5mg  M/w/f Some risk of colonoscopy discussed Call me with no for research study at [facility]

## 2013-05-12 NOTE — Assessment & Plan Note (Signed)
gold D COPD FEV1 26% 03/2009 on 24 h O2 since 2009 CAT 28  In August, decrease prednisone to 5mg  M/w/f   Some risk of colonoscopy discussed with  Conscious sedation She has several questions concerning a COPD study involving >Lung implantable coils She will call me with no for research study at [facility] - I reviewed inclusion criteria - feel that pCo2 55 may disqualify, she will also need full pFTs to reassess DLCO She likes her present regimen -we discussed newer agents

## 2013-05-12 NOTE — Progress Notes (Signed)
  Subjective:    Patient ID: [name], female    DOB: 1940-12-11, 72 y.o.   MRN: [id number]  HPI  70 yowf , heavy ex- smoker, quit 2002 with GOLD D COPD FEV1 26% 6/10 on 24 h O2 since 2009.  -completed pulm rehab oct '11  Recurrent flares 2-3/yr 2/13, 9/13 requiring steroid bursts  07/17/2012 CAT score 28   03/05/2013  Admitted 12/27/12 for COPD exacerbation . Tx w/ Levaquin and steroid burst. CXR w/ NAD .  ABG 7.39/55/75     05/12/2013 She requied prednisone since her admit in 5/14 &was placed on 5mg  daily since last visit Pt reports breathing has unchanged. Has occasional cough. No wheezing, no chest tx. She uses 3 liters 24/7. If she is really SOB at times she will bump up to 4L for a few minutes Likes her regimen of budesonide/brovana & atrovent nebs She has several questions concerning a COPD study involving >Lung implantable coils Wants to look into getting into this study at [facility] . Last CXR 12/27/12 w/ COPD changes, NAD    Review of Systems neg for any significant sore throat, dysphagia, itching, sneezing, nasal congestion or excess/ purulent secretions, fever, chills, sweats, unintended wt loss, pleuritic or exertional cp, hempoptysis, orthopnea pnd or change in chronic leg swelling. Also denies presyncope, palpitations, heartburn, abdominal pain, nausea, vomiting, diarrhea or change in bowel or urinary habits, dysuria,hematuria, rash, arthralgias, visual complaints, headache, numbness weakness or ataxia.     Objective:   Physical Exam  Gen. Pleasant, thin, in no distress ENT - no lesions, no post nasal drip Neck: No JVD, no thyromegaly, no carotid bruits Lungs: no use of accessory muscles, no dullness to percussion, decreased without rales or rhonchi  Cardiovascular: Rhythm regular, heart sounds  normal, no murmurs or gallops, no peripheral edema Musculoskeletal: No deformities, no cyanosis or clubbing        Assessment & Plan:

## 2013-05-13 ENCOUNTER — Telehealth: Payer: Self-pay | Admitting: Pulmonary Disease

## 2013-05-13 NOTE — Telephone Encounter (Signed)
Will forward to Dr. Alva so he is aware 

## 2013-05-14 ENCOUNTER — Encounter (HOSPITAL_COMMUNITY): Payer: Self-pay | Admitting: *Deleted

## 2013-05-14 ENCOUNTER — Encounter (HOSPITAL_COMMUNITY): Payer: Self-pay | Admitting: Pharmacy Technician

## 2013-05-19 NOTE — Telephone Encounter (Signed)
Left message for her but have not heard back. Mindy, can you try calling her again & give her my no to call if you are able to reach her

## 2013-05-21 NOTE — Telephone Encounter (Signed)
I called Irving Burton and gave your cell # [phone]. Will forward to Dr. Vassie Loll so he is aware

## 2013-05-21 NOTE — Telephone Encounter (Signed)
I spoke with Irving Burton and she gave me fax # [phone]. I advised will fax this over. Nothing further was needed

## 2013-05-21 NOTE — Telephone Encounter (Signed)
Let pt know that I spoke to [facility] fax her last note & PFT reports to [facility] to evaluate for study

## 2013-05-25 ENCOUNTER — Other Ambulatory Visit: Payer: Self-pay | Admitting: Family Medicine

## 2013-05-25 ENCOUNTER — Telehealth: Payer: Self-pay

## 2013-05-25 DIAGNOSIS — I10 Essential (primary) hypertension: Secondary | ICD-10-CM

## 2013-05-25 DIAGNOSIS — E039 Hypothyroidism, unspecified: Secondary | ICD-10-CM

## 2013-05-25 LAB — LIPID PANEL
HDL: 79 mg/dL (ref >39)
LDL Cholesterol: 104 mg/dL — ABNORMAL HIGH (ref 0–99)

## 2013-05-25 LAB — HEPATIC FUNCTION PANEL
ALT: 18 U/L (ref 0–35)
AST: 27 U/L (ref 0–37)
Albumin: 4.2 g/dL (ref 3.5–5.2)
Alkaline Phosphatase: 61 U/L (ref 39–117)
Indirect Bilirubin: 0.3 mg/dL (ref 0.0–0.9)
Total Protein: 6.6 g/dL (ref 6.0–8.3)

## 2013-05-25 LAB — RENAL FUNCTION PANEL
CO2: 34 mEq/L — ABNORMAL HIGH (ref 19–32)
Chloride: 96 mEq/L (ref 96–112)
Glucose, Bld: 78 mg/dL (ref 70–99)
Sodium: 140 mEq/L (ref 135–145)

## 2013-05-25 LAB — CBC
HCT: 38.4 % (ref 36.0–46.0)
Hemoglobin: 12.2 g/dL (ref 12.0–15.0)
MCH: 26.5 pg (ref 26.0–34.0)
MCV: 83.3 fL (ref 78.0–100.0)
RBC: 4.61 MIL/uL (ref 3.87–5.11)
WBC: 9.9 10*3/uL (ref 4.0–10.5)

## 2013-05-25 NOTE — Telephone Encounter (Signed)
Lab order placed.

## 2013-06-01 ENCOUNTER — Ambulatory Visit (INDEPENDENT_AMBULATORY_CARE_PROVIDER_SITE_OTHER): Payer: Medicare FFS | Admitting: Family Medicine

## 2013-06-01 ENCOUNTER — Encounter: Payer: Self-pay | Admitting: Family Medicine

## 2013-06-01 VITALS — BP 120/84 | HR 85 | Temp 97.4°F | Ht 61.0 in | Wt 97.0 lb

## 2013-06-01 DIAGNOSIS — G4700 Insomnia, unspecified: Secondary | ICD-10-CM

## 2013-06-01 DIAGNOSIS — D649 Anemia, unspecified: Secondary | ICD-10-CM

## 2013-06-01 DIAGNOSIS — I10 Essential (primary) hypertension: Secondary | ICD-10-CM

## 2013-06-01 DIAGNOSIS — F411 Generalized anxiety disorder: Secondary | ICD-10-CM

## 2013-06-01 DIAGNOSIS — J449 Chronic obstructive pulmonary disease, unspecified: Secondary | ICD-10-CM

## 2013-06-01 DIAGNOSIS — K219 Gastro-esophageal reflux disease without esophagitis: Secondary | ICD-10-CM

## 2013-06-01 MED ORDER — OMEPRAZOLE-SODIUM BICARBONATE 40-1100 MG PO CAPS: 1.0000 | ORAL_CAPSULE | Freq: Two times a day (BID) | ORAL | Status: DC

## 2013-06-01 MED ORDER — ALPRAZOLAM 0.5 MG PO TABS: 0.5000 mg | ORAL_TABLET | Freq: Two times a day (BID) | ORAL | Status: DC

## 2013-06-01 MED ORDER — MIRTAZAPINE 15 MG PO TABS: 15.0000 mg | ORAL_TABLET | Freq: Every day | ORAL | Status: DC

## 2013-06-01 NOTE — Patient Instructions (Addendum)
2 week renal   Labs prior to next visit renal cbc, tsh hepatic Cat Scratch Disease Cats often injure people by scratching or biting. This site of injury can become infected with a particular germ or bacteria present in the mouth of or on the cat. This germ is called Bartonella henselae. This infection is identified by the common name cat scratch disease (CSD).  SYMPTOMS  A red and sore pimple or bump, with or without pus, on the skin where the cat scratched or bit. The pimple or sore may be present for as long as three weeks after the scratch or bite occurred.  One or more enlarged lymph glands located toward the center of the body from where the injury occurred.  Less common symptoms include low-grade fever, tiredness, fatigue, headache and/or sore throat. DIAGNOSIS  The diagnosis is typically made by your caregiver who notes the history of a scratch or bite from a cat, and finds the skin sore and swollen lymph glands in the described area.  Culture of any drainage or pus from the injury site, or a needle aspiration or piece of tissue (biopsy) from a swollen lymph gland may also be done to confirm the diagnosis and assure that a different infection or disease is not causing your illness. Rare but serious complications may occur, they include:  Parinaud's syndrome - fever, swollen lymph glands and inflammation of the eye (conjunctivitis).  Infection of the brain (encephalitis).  Infection of the nerve of the eye (neuroretinitis).  Infection of the bone (osteomyelitis). TREATMENT  Usually treatment is not necessary or helpful, especially if you have a normal immune system. When infection is very severe, it may be treated with a medicine that kills the bacteria (antibiotic).  People with immune system problems (such as having AIDS or an organ transplant, or being on steroids or other immune modifying drugs) should be treated with antibiotics. HOME CARE INSTRUCTIONS   Avoid injury  while playing with cats.  Wash well after playing with cats.  Do not let your cat lick sores on your body.  Do not let your cat roam around outside of your house.  Keep the area of the cat scratch clean. Wash it with soap and water or apply an antiseptic solution such as povidone iodine.  You should get a tetanus shot if you have not had one in the past 5 or 10 years. If you receive one, your arm may get swollen and red and warm to the touch at the shot site. This is a common response to the medication in the shot. If you did not receive a tetanus shot here because you did not recall when your last one was given, make sure to check with your caregiver's office and determine if one is needed. Generally, for a "dirty" wound, you should receive a tetanus booster if you have not had one in the last five years. If you have a "clean" wound, you should receive a tetanus booster if you have not had one in the last ten years. SEEK IMMEDIATE MEDICAL CARE IF:   You have worsening signs of infection, such as more redness, increased pain, red streaking or pus coming from the wound, or warmth or swelling around the area of the scratch.  You develop worsening swollen lymph glands.  You develop abdominal pain, have problems with your vision or develop a skin rash.  You have a fever.  You become more tired or dizzy, or have a worsening headache.  You develop  inflammation of your eye or have increasing vision problems.  You have pain in one of your bones.  You develop a stiff neck.  You pass out. MAKE SURE YOU:   Understand these instructions.  Will watch your condition.  Will get help right away if you are not doing well or get worse. Document Released: 10/12/2000 Document Revised: 01/07/2012 Document Reviewed: 11/24/2008 [facility] Patient Information 2014 [place], [place].

## 2013-06-01 NOTE — Progress Notes (Signed)
Patient ID: [name], female   DOB: [birth year], 72 y.o.   MRN: [id number] [name] [id number] 09/18/1941 06/01/2013      Progress Note-Follow Up  Subjective  Chief Complaint  Chief Complaint  Patient presents with  . Annual Exam    physical    HPI  Patient is a 72 year old Caucasian female in today for routine medical care. She is doing well at the present time. Her oxygen is 24 7 but she's no longer having severe dyspnea. Had a recent hospitalization with multiple changes in meds but is doing much better. No increased shortness or breath, chest pain or palpitations at this time. Is taking medications as prescribed. No GI or GU complaints. Is eating well.  Past Medical History  Diagnosis Date  . COPD (chronic obstructive pulmonary disease)   . Thyroid disease   . Glucose intolerance (impaired glucose tolerance)   . History of small bowel obstruction   . Arthralgia   . Dyspnea   . Pallor   . Chest pain   . Osteoporosis   . Dysuria   . Weight loss   . Edema   . Weakness   . Anxiety   . Depression   . Migraine   . Hypertension   . Leukocytosis   . GERD (gastroesophageal reflux disease)   . Atrophic vaginitis 12/07/2012  . Hx of adenomatous colonic polyps   . Ischemic colitis     Past Surgical History  Procedure Laterality Date  . Cholecystectomy    . Abdominal hysterectomy  1993  . Tubal ligation    . Bladder repair    . Dilation and curettage of uterus      40 yrs ago  . Eye surgery  04/2011    bil. eyes    Family History  Problem Relation Age of Onset  . Alcohol abuse Mother   . Heart disease Mother   . Stroke Mother   . Hypertension Mother   . Kidney disease Mother   . COPD Brother   . Colon cancer Paternal Aunt   . Prostate cancer      History   Social History  . Marital Status: Married    Spouse Name: N/A    Number of Children: 1  . Years of Education: N/A   Occupational History  . Retired Science writer     Social History Main Topics  . Smoking status: Former Smoker -- 2.00 packs/day for 40 years    Types: Cigarettes    Quit date: 10/29/2001  . Smokeless tobacco: Never Used  . Alcohol Use: No  . Drug Use: No  . Sexually Active: Not on file   Other Topics Concern  . Not on file   Social History Narrative  . No narrative on file    Current Outpatient Prescriptions on File Prior to Visit  Medication Sig Dispense Refill  . albuterol (PROVENTIL HFA;VENTOLIN HFA) 108 (90 BASE) MCG/ACT inhaler Inhale 2 puffs into the lungs every 6 (six) hours as needed for wheezing or shortness of breath.       Marland Kitchen albuterol (PROVENTIL) (2.5 MG/3ML) 0.083% nebulizer solution Take 2.5 mg by nebulization every 4 (four) hours as needed for wheezing or shortness of breath.       . ALPRAZolam (XANAX) 0.5 MG tablet Take 0.5-1 mg by mouth 2 (two) times daily. Take 1 tablet by mouth every morning and take 2 tablets by mouth at bedtime.      Marland Kitchen arformoterol (BROVANA) 15 MCG/2ML  NEBU Take 2 mLs (15 mcg total) by nebulization 2 (two) times daily.  360 mL  3  . benzonatate (TESSALON) 200 MG capsule Take 1 capsule (200 mg total) by mouth 3 (three) times daily as needed for cough.  30 capsule  5  . budesonide (PULMICORT) 0.25 MG/2ML nebulizer solution Take 2 mLs (0.25 mg total) by nebulization 2 (two) times daily. Dx: 496  120 mL  12  . Calcium Carbonate-Vitamin D 600-400 MG-UNIT per tablet Take 1 tablet by mouth 2 (two) times daily.        . Coenzyme Q10 ([employer] COENZYME Q10) 100 MG capsule Take 100 mg by mouth daily.        Marland Kitchen dextromethorphan-guaiFENesin (MUCINEX DM) 30-600 MG per 12 hr tablet Take 1 tablet by mouth 2 (two) times daily as needed (for congestion).       . fexofenadine (ALLEGRA) 180 MG tablet Take 180 mg by mouth daily as needed (for allergies).       . Glucosamine-Chondroitin (GLUCOSAMINE CHONDR COMPLEX PO) Take 1 tablet by mouth 2 (two) times daily.       Marland Kitchen ipratropium (ATROVENT) 0.02 % nebulizer solution Take  500 mcg by nebulization 3 (three) times daily.      . mirtazapine (REMERON) 15 MG tablet Take 15 mg by mouth at bedtime.        Marland Kitchen omeprazole-sodium bicarbonate (ZEGERID) 40-1100 MG per capsule Take 1 capsule by mouth 2 (two) times daily.  180 capsule  3  . predniSONE (DELTASONE) 10 MG tablet Take 5 mg by mouth daily.        No current facility-administered medications on file prior to visit.    Allergies  Allergen Reactions  . Morphine     REACTION: nightmares    Review of Systems  Review of Systems  Constitutional: Negative for fever and malaise/fatigue.  HENT: Negative for congestion.   Eyes: Negative for discharge.  Respiratory: Negative for shortness of breath.   Cardiovascular: Negative for chest pain, palpitations and leg swelling.  Gastrointestinal: Negative for nausea, abdominal pain and diarrhea.  Genitourinary: Negative for dysuria.  Musculoskeletal: Negative for falls.  Skin: Negative for rash.  Neurological: Negative for loss of consciousness and headaches.  Endo/Heme/Allergies: Negative for polydipsia.  Psychiatric/Behavioral: Negative for depression and suicidal ideas. The patient is not nervous/anxious and does not have insomnia.     Objective  BP 120/84  Pulse 85  Temp(Src) 97.4 F (36.3 C) (Oral)  Ht 5\' 1"  (1.549 m)  Wt 97 lb (43.999 kg)  BMI 18.34 kg/m2  SpO2 99%  Physical Exam  Physical Exam  Constitutional: She is oriented to person, place, and time and well-developed, well-nourished, and in no distress. No distress.  HENT:  Head: Normocephalic and atraumatic.  Eyes: Conjunctivae are normal.  Neck: Neck supple. No thyromegaly present.  Cardiovascular: Normal rate, regular rhythm and normal heart sounds.   No murmur heard. Pulmonary/Chest: Effort normal and breath sounds normal. She has no wheezes.  Abdominal: She exhibits no distension and no mass.  Musculoskeletal: She exhibits no edema.  Lymphadenopathy:    She has no cervical adenopathy.   Neurological: She is alert and oriented to person, place, and time.  Skin: Skin is warm and dry. No rash noted. She is not diaphoretic.  Psychiatric: Memory, affect and judgment normal.    Lab Results  Component Value Date   TSH 5.581* 05/25/2013   Lab Results  Component Value Date   WBC 9.9 05/25/2013   HGB 12.2  05/25/2013   HCT 38.4 05/25/2013   MCV 83.3 05/25/2013   PLT 423* 05/25/2013   Lab Results  Component Value Date   CREATININE 0.54 05/25/2013   BUN 25* 05/25/2013   NA 140 05/25/2013   K 4.2 05/25/2013   CL 96 05/25/2013   CO2 34* 05/25/2013   Lab Results  Component Value Date   ALT 18 05/25/2013   AST 27 05/25/2013   ALKPHOS 61 05/25/2013   BILITOT 0.4 05/25/2013   Lab Results  Component Value Date   CHOL 217* 05/25/2013   Lab Results  Component Value Date   HDL 79 05/25/2013   Lab Results  Component Value Date   LDLCALC 104* 05/25/2013   Lab Results  Component Value Date   TRIG 172* 05/25/2013   Lab Results  Component Value Date   CHOLHDL 2.7 05/25/2013     Assessment & Plan  HYPERTENSION, BENIGN ESSENTIAL Well tretaed, no changes to meds  COPD On O2 24/7 doing well, feels better on her current regimen, is being evaluated for possible inclusion in a study at [facility] to have some stents placed in her lungs to help her increase her pulmonary capacity.  Anemia Resolved with recent blood draw  GERD Well controlled on current regimen, no changes, avoid offending foods

## 2013-06-03 NOTE — Assessment & Plan Note (Signed)
Resolved with recent blood draw 

## 2013-06-03 NOTE — Assessment & Plan Note (Signed)
Well controlled on current regimen, no changes, avoid offending foods

## 2013-06-03 NOTE — Assessment & Plan Note (Signed)
Well tretaed, no changes to meds

## 2013-06-03 NOTE — Assessment & Plan Note (Signed)
On O2 24/7 doing well, feels better on her current regimen, is being evaluated for possible inclusion in a study at [facility] to have some stents placed in her lungs to help her increase her pulmonary capacity.

## 2013-06-06 NOTE — H&P (Signed)
[name] 03/16/1941 MRN [id number]  History of Present Illness:  This is a 72-year-old white female who is due for a surveillance colonoscopy. She had a multilobulated adenomatous polyp removed on colonoscopy in 2009 and again had multiple  adenomatous polyps on a repeat colonoscopy in May 2011. At least 5 polyps were removed. She had a paternal aunt with colon cancer. Patient has severe COPD with an FEV1 26%. She has been on continuous oxygen at 3 L per minute since 2009. She had a recent exacerbation and hospitalization in April 2014 but is back to her baseline. She is down to 5 mg of prednisone a day which is her stable dose. She would like to go ahead with a colonoscopy because of her risks of colon cancer.   Past Medical History  Diagnosis Date  . COPD (chronic obstructive pulmonary disease)   . Thyroid disease   . Glucose intolerance (impaired glucose tolerance)   . History of small bowel obstruction   . Arthralgia   . Dyspnea   . Pallor   . Chest pain   . Osteoporosis   . Dysuria   . Weight loss   . Edema   . Weakness   . Anxiety   . Depression   . Migraine   . Hypertension   . Leukocytosis   . GERD (gastroesophageal reflux disease)   . Atrophic vaginitis 12/07/2012  . Hx of adenomatous colonic polyps   . Ischemic colitis    Past Surgical History  Procedure Laterality Date  . Cholecystectomy    . Abdominal hysterectomy  1993  . Tubal ligation    . Bladder repair      reports that she quit smoking about 11 years ago. Her smoking use included Cigarettes. She has a 80 pack-year smoking history. She has never used smokeless tobacco. She reports that  drinks alcohol. She reports that she does not use illicit drugs. family history includes Alcohol abuse in her mother; COPD in her brother; Colon cancer in her paternal aunt; Heart disease in her mother; Hypertension in her mother; Kidney disease in her mother; Prostate cancer in an unspecified family member; and Stroke  in her mother. Allergies  Allergen Reactions  . Morphine     REACTION: nightmares        Review of Systems: Denies dysphagia heartburn positive for shortness of breath  The remainder of the 10 point ROS is negative except as outlined in H&P   Physical Exam: General appearance  thin, in no distress., On portable oxygen 3 L a minute Eyes- non icteric. HEENT nontraumatic, normocephalic. Mouth no lesions, tongue papillated, no cheilosis. Neck supple without adenopathy, thyroid not enlarged, no carotid bruits, no JVD. Lungs Clear to auscultation bilaterally. Decreased breath sounds Cor normal S1, normal S2, regular rhythm, no murmur,  quiet precordium. Abdomen: Soft nontender. Rectal: Not done. Extremities no pedal edema. Skin no lesions. Neurological alert and oriented x 3. Psychological normal mood and affect.  Assessment and Plan:  Problem #1 72 year old white female who is due for repeat colonoscopy because of a history of multiple adenomatous polyps on the last 2 colonoscopies. She is at high risk for conscious sedation due to her severe respiratory disease. We will go ahead with scheduling a colonoscopy with propofol in the hospital setting. I have discussed the prep and sedation as well as the risks and she would like to go ahead and schedule it at her earliest convenience.   05/05/2013 [name]    multiple adenomatous polyps on the last 2 colonoscopies. She is at high risk for conscious sedation due to her severe respiratory disease. We will go ahead with scheduling a colonoscopy with propofol in the hospital setting. I have discussed the prep and sedation as well as the risks and she would like to go ahead and schedule it at her earliest convenience.  05/05/2013  [name]

## 2013-06-08 ENCOUNTER — Encounter (HOSPITAL_COMMUNITY)
Admission: RE | Disposition: A | Discharge status: Home / Self Care | Payer: Self-pay | Source: Ambulatory Visit | Attending: Internal Medicine

## 2013-06-08 ENCOUNTER — Encounter (HOSPITAL_COMMUNITY): Payer: Self-pay | Admitting: *Deleted

## 2013-06-08 ENCOUNTER — Ambulatory Visit (HOSPITAL_COMMUNITY)
Admission: RE | Admit: 2013-06-08 | Discharge: 2013-06-08 | Disposition: A | Discharge status: Home / Self Care | Payer: Medicare FFS | Source: Ambulatory Visit | Attending: Internal Medicine | Admitting: Internal Medicine

## 2013-06-08 ENCOUNTER — Encounter (HOSPITAL_COMMUNITY): Payer: Self-pay | Admitting: Anesthesiology

## 2013-06-08 ENCOUNTER — Ambulatory Visit (HOSPITAL_COMMUNITY): Payer: Medicare FFS | Admitting: Anesthesiology

## 2013-06-08 DIAGNOSIS — I10 Essential (primary) hypertension: Secondary | ICD-10-CM | POA: Insufficient documentation

## 2013-06-08 DIAGNOSIS — D371 Neoplasm of uncertain behavior of stomach: Secondary | ICD-10-CM | POA: Insufficient documentation

## 2013-06-08 DIAGNOSIS — J449 Chronic obstructive pulmonary disease, unspecified: Secondary | ICD-10-CM | POA: Insufficient documentation

## 2013-06-08 DIAGNOSIS — J4489 Other specified chronic obstructive pulmonary disease: Secondary | ICD-10-CM | POA: Insufficient documentation

## 2013-06-08 DIAGNOSIS — Z860100 Personal history of colon polyps, unspecified: Secondary | ICD-10-CM

## 2013-06-08 DIAGNOSIS — D126 Benign neoplasm of colon, unspecified: Secondary | ICD-10-CM

## 2013-06-08 DIAGNOSIS — Z87891 Personal history of nicotine dependence: Secondary | ICD-10-CM | POA: Insufficient documentation

## 2013-06-08 DIAGNOSIS — K219 Gastro-esophageal reflux disease without esophagitis: Secondary | ICD-10-CM | POA: Insufficient documentation

## 2013-06-08 DIAGNOSIS — M810 Age-related osteoporosis without current pathological fracture: Secondary | ICD-10-CM | POA: Insufficient documentation

## 2013-06-08 DIAGNOSIS — K5730 Diverticulosis of large intestine without perforation or abscess without bleeding: Secondary | ICD-10-CM | POA: Insufficient documentation

## 2013-06-08 HISTORY — PX: COLONOSCOPY WITH PROPOFOL: SHX5780

## 2013-06-08 SURGERY — COLONOSCOPY WITH PROPOFOL
Anesthesia: Monitor Anesthesia Care

## 2013-06-08 MED ORDER — PROMETHAZINE HCL 25 MG/ML IJ SOLN: 6.2500 mg | INTRAMUSCULAR | Status: DC | PRN

## 2013-06-08 MED ORDER — LIDOCAINE HCL 1 % IJ SOLN
INTRAMUSCULAR | Status: DC | PRN
  Administered 2013-06-08: 50 mg via INTRADERMAL

## 2013-06-08 MED ORDER — SODIUM CHLORIDE 0.9 % IV SOLN: INTRAVENOUS | Status: DC

## 2013-06-08 MED ORDER — MIDAZOLAM HCL 5 MG/5ML IJ SOLN
INTRAMUSCULAR | Status: DC | PRN
  Administered 2013-06-08 (×2): 1 mg via INTRAVENOUS

## 2013-06-08 MED ORDER — LACTATED RINGERS IV SOLN
INTRAVENOUS | Status: DC | PRN
  Administered 2013-06-08: 14:00:00 via INTRAVENOUS

## 2013-06-08 MED ORDER — FENTANYL CITRATE 0.05 MG/ML IJ SOLN
INTRAMUSCULAR | Status: DC | PRN
  Administered 2013-06-08: 50 ug via INTRAVENOUS
  Administered 2013-06-08 (×2): 25 ug via INTRAVENOUS

## 2013-06-08 MED ORDER — PHENYLEPHRINE HCL 10 MG/ML IJ SOLN
INTRAMUSCULAR | Status: DC | PRN
  Administered 2013-06-08: 80 ug via INTRAVENOUS

## 2013-06-08 MED ORDER — MEPERIDINE HCL 100 MG/ML IJ SOLN: 6.2500 mg | INTRAMUSCULAR | Status: DC | PRN

## 2013-06-08 MED ORDER — PROPOFOL INFUSION 10 MG/ML OPTIME
INTRAVENOUS | Status: DC | PRN
  Administered 2013-06-08: 50 ug/kg/min via INTRAVENOUS

## 2013-06-08 SURGICAL SUPPLY — 22 items

## 2013-06-08 NOTE — Transfer of Care (Signed)
Immediate Anesthesia Transfer of Care Note  Patient: [name]  Procedure(s) Performed: Procedure(s): COLONOSCOPY WITH PROPOFOL (N/A)  Patient Location: PACU and Endoscopy Unit  Anesthesia Type:MAC  Level of Consciousness: awake, alert , oriented and patient cooperative  Airway & Oxygen Therapy: Patient Spontanous Breathing and Patient connected to face mask oxygen  Post-op Assessment: Report given to PACU RN and Post -op Vital signs reviewed and stable  Post vital signs: Reviewed and stable  Complications: No apparent anesthesia complications

## 2013-06-08 NOTE — Interval H&P Note (Signed)
History and Physical Interval Note:  06/08/2013 1:55 PM  [name]  has presented today for surgery, with the diagnosis of colon polyps  The various methods of treatment have been discussed with the patient and family. After consideration of risks, benefits and other options for treatment, the patient has consented to  Procedure(s): COLONOSCOPY WITH PROPOFOL (N/A) as a surgical intervention .  The patient's history has been reviewed, patient examined, no change in status, stable for surgery.  I have reviewed the patient's chart and labs.  Questions were answered to the patient's satisfaction.     [name]

## 2013-06-08 NOTE — Anesthesia Postprocedure Evaluation (Signed)
  Anesthesia Post-op Note  Patient: [name]  Procedure(s) Performed: Procedure(s) (LRB): COLONOSCOPY WITH PROPOFOL (N/A)  Patient Location: PACU  Anesthesia Type: MAC  Level of Consciousness: awake and alert   Airway and Oxygen Therapy: Patient Spontanous Breathing  Post-op Pain: mild  Post-op Assessment: Post-op Vital signs reviewed, Patient's Cardiovascular Status Stable, Respiratory Function Stable, Patent Airway and No signs of Nausea or vomiting  Last Vitals:  Filed Vitals:   06/08/13 1540  BP: 124/79  Pulse:   Temp:   Resp: 21    Post-op Vital Signs: stable   Complications: No apparent anesthesia complications

## 2013-06-08 NOTE — Op Note (Addendum)
[facility] [address] [place], [address]   COLONOSCOPY PROCEDURE REPORT  PATIENT: [name], [name]  MR#: [id number] BIRTHDATE: 10-Jan-1941 , 71  yrs. old GENDER: Female ENDOSCOPIST: Hart Carwin, MD REFERRED BY:  Reuel Derby, M.D. PROCEDURE DATE:  06/08/2013 PROCEDURE:   Colonoscopy with cold biopsy polypectomy and Colonoscopy with snare polypectomy ASA CLASS:   Class III INDICATIONS:large multilobulated adenomatou spolyp 2009, 5 adenomatous and villoadenomatous polyps in 2011, pt has Oxygen dependent COPD MEDICATIONS: MAC sedation, administered by CRNA  DESCRIPTION OF PROCEDURE:   After the risks and benefits and of the procedure were explained, informed consent was obtained.  A digital rectal exam revealed no abnormalities of the rectum.    The Pentax Ped Colon [id number]  endoscope was introduced through the anus and advanced to the cecum, which was identified by both the appendix and ileocecal valve .  The quality of the prep was excellent, using MoviPrep .  The instrument was then slowly withdrawn as the colon was fully examined.     COLON FINDINGS: Two polypoid shaped sessile polyps measuring 15 mm and 3 mm  in size were found at the cecum.  A polypectomy was performed with cold forceps and using snare cautery 9 removed piecemeal).  The resection was complete and the polyp tissue was completely retrieved.   Mild diverticulosis was noted in the sigmoid colon.     Retroflexed views revealed no abnormalities. The scope was then withdrawn from the patient and the procedure completed.  COMPLICATIONS: There were no complications. ENDOSCOPIC IMPRESSION: 1.   Two sessile polyps measuring 15 mm in size were found at the cecum; polypectomy was performed with cold forceps and using snare cautery 2.   Mild diverticulosis was noted in the sigmoid colon  RECOMMENDATIONS: Await pathology results No ASA or NSAIDs x 2 weeks   REPEAT EXAM: 5 year if  stable medically  cc:  _______________________________ eSignedHart Carwin, MD 06/08/2013 3:02 PM     PATIENT NAME:  [name], [name] MR#: [id number]

## 2013-06-08 NOTE — Anesthesia Preprocedure Evaluation (Addendum)
Anesthesia Evaluation  Patient identified by MRN, date of birth, ID band Patient awake    Reviewed: Allergy & Precautions, H&P , NPO status , Patient's Chart, lab work & pertinent test results  Airway Mallampati: II TM Distance: >3 FB Neck ROM: Full    Dental no notable dental hx. (+) Upper Dentures and Edentulous Upper   Pulmonary shortness of breath, COPD COPD inhaler and oxygen dependent,    Pulmonary exam normal + decreased breath sounds      Cardiovascular hypertension, Pt. on medications Rhythm:Regular Rate:Normal     Neuro/Psych negative neurological ROS  negative psych ROS   GI/Hepatic negative GI ROS, Neg liver ROS,   Endo/Other  Hypothyroidism   Renal/GU negative Renal ROS  negative genitourinary   Musculoskeletal negative musculoskeletal ROS (+)   Abdominal   Peds negative pediatric ROS (+)  Hematology negative hematology ROS (+)   Anesthesia Other Findings   Reproductive/Obstetrics negative OB ROS                          Anesthesia Physical Anesthesia Plan  ASA: III  Anesthesia Plan: MAC   Post-op Pain Management:    Induction:   Airway Management Planned: Simple Face Mask  Additional Equipment:   Intra-op Plan:   Post-operative Plan:   Informed Consent: I have reviewed the patients History and Physical, chart, labs and discussed the procedure including the risks, benefits and alternatives for the proposed anesthesia with the patient or authorized representative who has indicated his/her understanding and acceptance.   Dental advisory given  Plan Discussed with:   Anesthesia Plan Comments:         Anesthesia Quick Evaluation

## 2013-06-09 ENCOUNTER — Encounter (HOSPITAL_COMMUNITY): Payer: Self-pay | Admitting: Internal Medicine

## 2013-06-13 ENCOUNTER — Encounter: Payer: Self-pay | Admitting: Internal Medicine

## 2013-06-17 ENCOUNTER — Other Ambulatory Visit: Payer: Self-pay | Admitting: Family Medicine

## 2013-06-17 LAB — CBC
HCT: 34.9 % — ABNORMAL LOW (ref 36.0–46.0)
MCH: 27.1 pg (ref 26.0–34.0)
MCHC: 32.1 g/dL (ref 30.0–36.0)
RDW: 17.3 % — ABNORMAL HIGH (ref 11.5–15.5)

## 2013-06-18 LAB — LIPID PANEL
LDL Cholesterol: 79 mg/dL (ref 0–99)
Triglycerides: 238 mg/dL — ABNORMAL HIGH (ref <150)
VLDL: 48 mg/dL — ABNORMAL HIGH (ref 0–40)

## 2013-06-18 LAB — RENAL FUNCTION PANEL
CO2: 36 mEq/L — ABNORMAL HIGH (ref 19–32)
Chloride: 96 mEq/L (ref 96–112)
Phosphorus: 4.8 mg/dL — ABNORMAL HIGH (ref 2.3–4.6)
Potassium: 4.5 mEq/L (ref 3.5–5.3)
Sodium: 139 mEq/L (ref 135–145)

## 2013-06-18 LAB — HEPATIC FUNCTION PANEL
AST: 20 U/L (ref 0–37)
Alkaline Phosphatase: 62 U/L (ref 39–117)
Bilirubin, Direct: <0.1 mg/dL (ref 0.0–0.3)
Total Bilirubin: 0.3 mg/dL (ref 0.3–1.2)

## 2013-06-25 ENCOUNTER — Telehealth: Payer: Self-pay

## 2013-06-25 NOTE — Telephone Encounter (Signed)
Called to verify with patient on how she is taking the Alprazolam. Pt states she takes 1 tab qam and 2 tabs qhs

## 2013-07-14 ENCOUNTER — Ambulatory Visit: Payer: Medicare FFS | Admitting: Pulmonary Disease

## 2013-07-14 ENCOUNTER — Telehealth: Payer: Self-pay | Admitting: Adult Health

## 2013-07-14 ENCOUNTER — Ambulatory Visit (INDEPENDENT_AMBULATORY_CARE_PROVIDER_SITE_OTHER): Payer: Medicare FFS | Admitting: Adult Health

## 2013-07-14 ENCOUNTER — Encounter: Payer: Self-pay | Admitting: Adult Health

## 2013-07-14 VITALS — BP 112/60 | HR 84 | Temp 97.5°F | Ht 61.0 in | Wt 95.0 lb

## 2013-07-14 DIAGNOSIS — Z23 Encounter for immunization: Secondary | ICD-10-CM

## 2013-07-14 DIAGNOSIS — J449 Chronic obstructive pulmonary disease, unspecified: Secondary | ICD-10-CM

## 2013-07-14 MED ORDER — ALBUTEROL SULFATE (2.5 MG/3ML) 0.083% IN NEBU: 2.5000 mg | INHALATION_SOLUTION | RESPIRATORY_TRACT | Status: DC | PRN

## 2013-07-14 MED ORDER — ARFORMOTEROL TARTRATE 15 MCG/2ML IN NEBU: 15.0000 ug | INHALATION_SOLUTION | Freq: Two times a day (BID) | RESPIRATORY_TRACT | Status: DC

## 2013-07-14 MED ORDER — BUDESONIDE 0.25 MG/2ML IN SUSP: 0.2500 mg | Freq: Two times a day (BID) | RESPIRATORY_TRACT | Status: DC

## 2013-07-14 MED ORDER — IPRATROPIUM BROMIDE 0.02 % IN SOLN: 500.0000 ug | Freq: Four times a day (QID) | RESPIRATORY_TRACT | Status: DC

## 2013-07-14 NOTE — Progress Notes (Signed)
  Subjective:    Patient ID: [name], female    DOB: [birth year], 72 y.o.   MRN: [id number]  HPI   72 yowf , heavy ex- smoker, quit 2002 with GOLD D COPD FEV1 26% 6/10 on 24 h O2 since 2009.  -completed pulm rehab oct '11  Recurrent flares 2-3/yr 2/13, 9/13 requiring steroid bursts  07/17/2012 CAT score 28   03/05/2013  Admitted 12/27/12 for COPD exacerbation . Tx w/ Levaquin and steroid burst. CXR w/ NAD .  ABG 7.39/55/75     05/12/13 Follow up  She requied prednisone since her admit in 5/14 &was placed on 5mg  daily since last visit Pt reports breathing has unchanged. Has occasional cough. No wheezing, no chest tx. She uses 3 liters 24/7. If she is really SOB at times she will bump up to 4L for a few minutes Likes her regimen of budesonide/brovana & atrovent nebs She has several questions concerning a COPD study involving >Lung implantable coils Wants to look into getting into this study at [facility] . Last CXR 12/27/12 w/ COPD changes, NAD   >>pred 5mg  m/w/f  07/14/2013 Follow up COPD  Pt reports she has had ups and down with her breathing since last OV. She coughs about twice a day and it is clear globs of phlem. This is normally after her breathing tx. Chest gets tight when she gets SOB. She is using 3 liters O2 24/7. At time sshe will go up to 4 liters if she feels very SOB. She tried to take the prednisone 5 mg M,W,F for about 3 weeks and her breathing worsened so she tried taking 1/2 tablet daily and hat sitll did not help breathing so she went back up to 10 mg daily.  Remains on Brovana and Budesonide Neb Twice daily   Had colonoscopy with polyp removal without dififculty Needs flu shot today  No chest pain, orthopnea, edema or fever.    Review of Systems  neg for any significant sore throat, dysphagia, itching, sneezing, nasal congestion or excess/ purulent secretions, fever, chills, sweats, unintended wt loss, pleuritic or exertional cp, hempoptysis, orthopnea pnd or change in  chronic leg swelling. Also denies presyncope, palpitations, heartburn, abdominal pain, nausea, vomiting, diarrhea or change in bowel or urinary habits, dysuria,hematuria, rash, arthralgias, visual complaints, headache, numbness weakness or ataxia.     Objective:   Physical Exam   Gen. Pleasant, thin, in no distress ENT - no lesions, no post nasal drip Neck: No JVD, no thyromegaly, no carotid bruits Lungs: no use of accessory muscles, no dullness to percussion, decreased without rales or rhonchi  Cardiovascular: Rhythm regular, heart sounds  normal, no murmurs or gallops, no peripheral edema Musculoskeletal: No deformities, no cyanosis or clubbing        Assessment & Plan:

## 2013-07-14 NOTE — Assessment & Plan Note (Signed)
Mild flare on lower dose of prednisone  Seems to flare with dose 5mg  of pred   Plan  Mucinex DM Twice daily  As needed  .cough/congestion Remain on Prednisone 10mg  daily for 2 weeks then  Decrease Prednisone 5mg  daily -hold at this dose until seen back in office.  Continue on Budesonide 0.25mg  Neb Twice daily   Continue on  [employer] Twice daily   Continue on Ipratropium Neb Four times a day   May use Albuterol Neb every 4hr as needed.  Saline nasal rinses and saline gel As needed   Flu shot today  Please contact office for sooner follow up if symptoms do not improve or worsen or seek emergency care  Follow up with Dr. Vassie Loll  In 2 month and As needed

## 2013-07-14 NOTE — Patient Instructions (Addendum)
Mucinex DM Twice daily  As needed  .cough/congestion Remain on Prednisone 10mg  daily for 2 weeks then  Decrease Prednisone 5mg  daily -hold at this dose until seen back in office.  Continue on Budesonide 0.25mg  Neb Twice daily   Continue on  [employer] Twice daily   Continue on Ipratropium Neb Four times a day   May use Albuterol Neb every 4hr as needed.  Saline nasal rinses and saline gel As needed   Flu shot today  Please contact office for sooner follow up if symptoms do not improve or worsen or seek emergency care  Follow up with Dr. Vassie Loll  In 2 month and As needed

## 2013-07-14 NOTE — Telephone Encounter (Signed)
Pt was to call to discuss and verify how she is taking her neb meds. Pt stated that she is taking her brovana/budesonide BID and ipratropium QID with albuterol q4h prn. Advised pt that she is taking her meds correctly and to continue this  Pt did report that she is getting her nebs thru 2 different pharmacies >> budesonide and ipratropium thru [facility] and brovana thru [facility].  Pt would like to get all 3 from [facility] and stated that this company is able to provide/cover all 3 meds.  She would like these mailed to her.  Advised will have TP sign rx's when she returns to the office tomorrow.  Home address verified with patient.

## 2013-07-15 NOTE — Telephone Encounter (Signed)
Rx's signed by TP and placed in the mail for pt Pt is aware Will sign off

## 2013-08-03 ENCOUNTER — Telehealth: Payer: Self-pay | Admitting: Pulmonary Disease

## 2013-08-03 MED ORDER — BUDESONIDE 0.25 MG/2ML IN SUSP: 0.2500 mg | Freq: Two times a day (BID) | RESPIRATORY_TRACT | Status: DC

## 2013-08-03 NOTE — Telephone Encounter (Signed)
I spoke with pt. She stated she called [facility] this am to find out why she hasn't received her medications yet. She was told they are running 21 days behind of refilling medications and she needs to call her doctor for refill if she is out. I advised pt will send in RX and nothing further needed

## 2013-08-31 ENCOUNTER — Ambulatory Visit (INDEPENDENT_AMBULATORY_CARE_PROVIDER_SITE_OTHER): Payer: Medicare FFS | Admitting: Family Medicine

## 2013-08-31 ENCOUNTER — Encounter: Payer: Self-pay | Admitting: Family Medicine

## 2013-08-31 VITALS — BP 118/78 | HR 105 | Temp 97.7°F | Ht 61.0 in | Wt 101.1 lb

## 2013-08-31 DIAGNOSIS — R32 Unspecified urinary incontinence: Secondary | ICD-10-CM

## 2013-08-31 DIAGNOSIS — J449 Chronic obstructive pulmonary disease, unspecified: Secondary | ICD-10-CM

## 2013-08-31 DIAGNOSIS — K5289 Other specified noninfective gastroenteritis and colitis: Secondary | ICD-10-CM

## 2013-08-31 DIAGNOSIS — K219 Gastro-esophageal reflux disease without esophagitis: Secondary | ICD-10-CM

## 2013-08-31 DIAGNOSIS — N390 Urinary tract infection, site not specified: Secondary | ICD-10-CM

## 2013-08-31 DIAGNOSIS — I10 Essential (primary) hypertension: Secondary | ICD-10-CM

## 2013-08-31 DIAGNOSIS — R351 Nocturia: Secondary | ICD-10-CM

## 2013-08-31 DIAGNOSIS — R634 Abnormal weight loss: Secondary | ICD-10-CM

## 2013-08-31 DIAGNOSIS — E039 Hypothyroidism, unspecified: Secondary | ICD-10-CM

## 2013-08-31 DIAGNOSIS — R197 Diarrhea, unspecified: Secondary | ICD-10-CM

## 2013-08-31 DIAGNOSIS — D649 Anemia, unspecified: Secondary | ICD-10-CM

## 2013-08-31 DIAGNOSIS — Z23 Encounter for immunization: Secondary | ICD-10-CM

## 2013-08-31 LAB — CBC
MCH: 27.6 pg (ref 26.0–34.0)
MCHC: 32.2 g/dL (ref 30.0–36.0)
Platelets: 353 10*3/uL (ref 150–400)
RBC: 4.38 MIL/uL (ref 3.87–5.11)

## 2013-08-31 LAB — RENAL FUNCTION PANEL
Albumin: 4 g/dL (ref 3.5–5.2)
CO2: 35 mEq/L — ABNORMAL HIGH (ref 19–32)
Calcium: 9.4 mg/dL (ref 8.4–10.5)
Phosphorus: 3.6 mg/dL (ref 2.3–4.6)
Sodium: 141 mEq/L (ref 135–145)

## 2013-08-31 LAB — HEPATIC FUNCTION PANEL
Bilirubin, Direct: <0.1 mg/dL (ref 0.0–0.3)
Total Bilirubin: 0.2 mg/dL — ABNORMAL LOW (ref 0.3–1.2)

## 2013-08-31 LAB — TSH: TSH: 1.547 u[IU]/mL (ref 0.350–4.500)

## 2013-08-31 NOTE — Assessment & Plan Note (Signed)
Repeat cbc today 

## 2013-08-31 NOTE — Progress Notes (Signed)
Patient ID: [name], female   DOB: [birth year], 72 y.o.   MRN: [id number] KHALI PERELLA [id number] July 15, 1941 08/31/2013      Progress Note-Follow Up  Subjective  Chief Complaint  Chief Complaint  Patient presents with  . Follow-up  . Injections    tdap    HPI  Patient is a 72 year old who is in today for followup sister with intermittent urinary frequency especially at night. Notes even some increasing urinary incontinence recently along with some infrequent episodes of bowel incontinence. It is always in her stool or loose which they have been a lot lately. No bloody or tarry stool. No blood in the urine. No dysuria. Does know when she drinks less coffee she has less trouble with the symptoms. Continues to struggle with shortness of breath secondary to her COPD. No fevers or chills. No cough, chest pain or palpitations.  Past Medical History  Diagnosis Date  . COPD (chronic obstructive pulmonary disease)   . Thyroid disease   . Glucose intolerance (impaired glucose tolerance)   . History of small bowel obstruction   . Arthralgia   . Dyspnea   . Pallor   . Chest pain   . Osteoporosis   . Dysuria   . Weight loss   . Edema   . Weakness   . Anxiety   . Depression   . Migraine   . Hypertension   . Leukocytosis   . GERD (gastroesophageal reflux disease)   . Atrophic vaginitis 12/07/2012  . Hx of adenomatous colonic polyps   . Ischemic colitis     Past Surgical History  Procedure Laterality Date  . Cholecystectomy    . Abdominal hysterectomy  1993  . Tubal ligation    . Bladder repair    . Dilation and curettage of uterus      40 yrs ago  . Eye surgery  04/2011    bil. eyes  . Colonoscopy with propofol N/A 06/08/2013    Procedure: COLONOSCOPY WITH PROPOFOL;  Surgeon: Hart Carwin, MD;  Location: [facility];  Service: Endoscopy;  Laterality: N/A;    Family History  Problem Relation Age of Onset  . Alcohol abuse Mother   . Heart disease Mother   .  Stroke Mother   . Hypertension Mother   . Kidney disease Mother   . COPD Mother   . Osteoporosis Mother     hip fracture  . COPD Brother   . Alcohol abuse Brother     drug abuse  . Hearing loss Brother     mvp  . Colon cancer Paternal Aunt   . Prostate cancer    . Alcohol abuse Father     MVA  . Thyroid disease Daughter   . COPD Paternal Grandfather   . COPD Sister   . Hypertension Sister   . Anxiety disorder Sister   . COPD Sister   . COPD Brother     current smoker  . Hearing loss Brother     cad  . COPD Brother     History   Social History  . Marital Status: Married    Spouse Name: N/A    Number of Children: 1  . Years of Education: N/A   Occupational History  . Retired Science writer    Social History Main Topics  . Smoking status: Former Smoker -- 2.00 packs/day for 40 years    Types: Cigarettes    Quit date: 10/29/2001  . Smokeless tobacco: Never Used  .  Alcohol Use: No  . Drug Use: No  . Sexual Activity: Not on file   Other Topics Concern  . Not on file   Social History Narrative  . No narrative on file    Current Outpatient Prescriptions on File Prior to Visit  Medication Sig Dispense Refill  . albuterol (PROVENTIL HFA;VENTOLIN HFA) 108 (90 BASE) MCG/ACT inhaler Inhale 2 puffs into the lungs every 6 (six) hours as needed for wheezing or shortness of breath.       Marland Kitchen albuterol (PROVENTIL) (2.5 MG/3ML) 0.083% nebulizer solution Take 3 mLs (2.5 mg total) by nebulization every 4 (four) hours as needed for wheezing or shortness of breath. Dx 496  300 mL  3  . ALPRAZolam (XANAX) 0.5 MG tablet Take 0.5 mg by mouth as directed. Take 1 tablet by mouth every morning and take 2 tablets by mouth at bedtime.      Marland Kitchen arformoterol (BROVANA) 15 MCG/2ML NEBU Take 2 mLs (15 mcg total) by nebulization 2 (two) times daily. Dx 496  360 mL  3  . benzonatate (TESSALON) 200 MG capsule Take 1 capsule (200 mg total) by mouth 3 (three) times daily as needed for  cough.  30 capsule  5  . budesonide (PULMICORT) 0.25 MG/2ML nebulizer solution Take 2 mLs (0.25 mg total) by nebulization 2 (two) times daily. Dx: 496  360 mL  3  . budesonide (PULMICORT) 0.25 MG/2ML nebulizer solution Take 2 mLs (0.25 mg total) by nebulization 2 (two) times daily.  120 mL  0  . Calcium Carbonate-Vitamin D 600-400 MG-UNIT per tablet Take 1 tablet by mouth 2 (two) times daily.        . Coenzyme Q10 ([employer] COENZYME Q10) 100 MG capsule Take 100 mg by mouth daily.        Marland Kitchen dextromethorphan-guaiFENesin (MUCINEX DM) 30-600 MG per 12 hr tablet Take 1 tablet by mouth 2 (two) times daily as needed (for congestion).       . fexofenadine (ALLEGRA) 180 MG tablet Take 180 mg by mouth daily as needed (for allergies).       . Glucosamine-Chondroitin (GLUCOSAMINE CHONDR COMPLEX PO) Take 1 tablet by mouth 2 (two) times daily.       Marland Kitchen ipratropium (ATROVENT) 0.02 % nebulizer solution Take 2.5 mLs (500 mcg total) by nebulization 4 (four) times daily. Dx 496  900 mL  3  . mirtazapine (REMERON) 15 MG tablet Take 1 tablet (15 mg total) by mouth at bedtime.  90 tablet  1  . omeprazole-sodium bicarbonate (ZEGERID) 40-1100 MG per capsule Take 1 capsule by mouth 2 (two) times daily.  180 capsule  3  . predniSONE (DELTASONE) 10 MG tablet Take 5 mg by mouth daily.        No current facility-administered medications on file prior to visit.    Allergies  Allergen Reactions  . Morphine     REACTION: nightmares    Review of Systems  Review of Systems  Constitutional: Negative for fever and malaise/fatigue.  HENT: Negative for congestion.   Eyes: Negative for discharge.  Respiratory: Positive for shortness of breath. Negative for cough and hemoptysis.   Cardiovascular: Negative for chest pain, palpitations and leg swelling.  Gastrointestinal: Negative for nausea, abdominal pain and diarrhea.  Genitourinary: Negative for dysuria.  Musculoskeletal: Negative for falls.  Skin: Negative for rash.   Neurological: Negative for loss of consciousness and headaches.  Endo/Heme/Allergies: Negative for polydipsia.  Psychiatric/Behavioral: Negative for depression and suicidal ideas. The patient is not nervous/anxious  and does not have insomnia.     Objective  BP 118/78  Pulse 105  Temp(Src) 97.7 F (36.5 C) (Oral)  Ht 5\' 1"  (1.549 m)  Wt 101 lb 1.3 oz (45.85 kg)  BMI 19.11 kg/m2  SpO2 91%  Physical Exam  Physical Exam  Constitutional: She is oriented to person, place, and time and well-developed, well-nourished, and in no distress. No distress.  HENT:  Head: Normocephalic and atraumatic.  Eyes: Conjunctivae are normal.  Neck: Neck supple. No thyromegaly present.  Cardiovascular: Normal rate, regular rhythm and normal heart sounds.   No murmur heard. Pulmonary/Chest: Effort normal and breath sounds normal. She has no wheezes.  Abdominal: She exhibits no distension and no mass.  Musculoskeletal: She exhibits no edema.  Lymphadenopathy:    She has no cervical adenopathy.  Neurological: She is alert and oriented to person, place, and time.  Skin: Skin is warm and dry. No rash noted. She is not diaphoretic.  Psychiatric: Memory, affect and judgment normal.    Lab Results  Component Value Date   TSH 1.187 06/17/2013   Lab Results  Component Value Date   WBC 9.1 06/17/2013   HGB 11.2* 06/17/2013   HCT 34.9* 06/17/2013   MCV 84.3 06/17/2013   PLT 444* 06/17/2013   Lab Results  Component Value Date   CREATININE 0.65 06/17/2013   BUN 35* 06/17/2013   NA 139 06/17/2013   K 4.5 06/17/2013   CL 96 06/17/2013   CO2 36* 06/17/2013   Lab Results  Component Value Date   ALT 14 06/17/2013   AST 20 06/17/2013   ALKPHOS 62 06/17/2013   BILITOT 0.3 06/17/2013   Lab Results  Component Value Date   CHOL 196 06/17/2013   Lab Results  Component Value Date   HDL 69 06/17/2013   Lab Results  Component Value Date   LDLCALC 79 06/17/2013   Lab Results  Component Value Date   TRIG 238*  06/17/2013   Lab Results  Component Value Date   CHOLHDL 2.8 06/17/2013     Assessment & Plan  COPD, severe Still struggling with significant hypoxia on 4 liters Ox via [place]. Is trying to get in the study at [facility] to have a new procedure. No new news. Following closely with pulmonology  COLITIS Recent flare in diarrhea. Over past 2-3 months, increased to 2-4 loose stool daily with some slight episodes of incontinence. Encouraged probiotics and fiber and check stool cultures. Has been on and off antibiotics and steroids.   HYPOTHYROIDISM Recheck tsh today, had recent elevation of TSH  UTI (lower urinary tract infection) Recent increased urinary urgency and incontinence. Will check UA and C&S. Add probiotic  Anemia Repeat cbc today  GERD No recent flares  HYPERTENSION, BENIGN ESSENTIAL Well controlled no changes.  Nocturia Arises 1-3 x a night. Urinalysis negative  WEIGHT LOSS Her appetite has increased and her weight is up.  Diarrhea Encouraged probiotics, benefiber bid, check stool cultures.

## 2013-08-31 NOTE — Assessment & Plan Note (Signed)
Well controlled no changes 

## 2013-08-31 NOTE — Assessment & Plan Note (Signed)
Recheck tsh today, had recent elevation of TSH

## 2013-08-31 NOTE — Patient Instructions (Signed)
Add a probiotic with Digestive Advantage and a fiber supplement such as Benefiber twice a day  Diarrhea Diarrhea is frequent loose and watery bowel movements. It can cause you to feel weak and dehydrated. Dehydration can cause you to become tired and thirsty, have a dry mouth, and have decreased urination that often is dark yellow. Diarrhea is a sign of another problem, most often an infection that will not last long. In most cases, diarrhea typically lasts 2 3 days. However, it can last longer if it is a sign of something more serious. It is important to treat your diarrhea as directed by your caregive to lessen or prevent future episodes of diarrhea. CAUSES  Some common causes include:  Gastrointestinal infections caused by viruses, bacteria, or parasites.  Food poisoning or food allergies.  Certain medicines, such as antibiotics, chemotherapy, and laxatives.  Artificial sweeteners and fructose.  Digestive disorders. HOME CARE INSTRUCTIONS  Ensure adequate fluid intake (hydration): have 1 cup (8 oz) of fluid for each diarrhea episode. Avoid fluids that contain simple sugars or sports drinks, fruit juices, whole milk products, and sodas. Your urine should be clear or pale yellow if you are drinking enough fluids. Hydrate with an oral rehydration solution that you can purchase at pharmacies, retail stores, and online. You can prepare an oral rehydration solution at home by mixing the following ingredients together:    tsp table salt.   tsp baking soda.   tsp salt substitute containing potassium chloride.  1  tablespoons sugar.  1 L (34 oz) of water.  Certain foods and beverages may increase the speed at which food moves through the gastrointestinal (GI) tract. These foods and beverages should be avoided and include:  Caffeinated and alcoholic beverages.  High-fiber foods, such as raw fruits and vegetables, nuts, seeds, and whole grain breads and cereals.  Foods and beverages  sweetened with sugar alcohols, such as xylitol, sorbitol, and mannitol.  Some foods may be well tolerated and may help thicken stool including:  Starchy foods, such as rice, toast, pasta, low-sugar cereal, oatmeal, grits, baked potatoes, crackers, and bagels.  Bananas.  Applesauce.  Add probiotic-rich foods to help increase healthy bacteria in the GI tract, such as yogurt and fermented milk products.  Wash your hands well after each diarrhea episode.  Only take over-the-counter or prescription medicines as directed by your caregiver.  Take a warm bath to relieve any burning or pain from frequent diarrhea episodes. SEEK IMMEDIATE MEDICAL CARE IF:   You are unable to keep fluids down.  You have persistent vomiting.  You have blood in your stool, or your stools are black and tarry.  You do not urinate in 6 8 hours, or there is only a small amount of very dark urine.  You have abdominal pain that increases or localizes.  You have weakness, dizziness, confusion, or lightheadedness.  You have a severe headache.  Your diarrhea gets worse or does not get better.  You have a fever or persistent symptoms for more than 2 3 days.  You have a fever and your symptoms suddenly get worse. MAKE SURE YOU:   Understand these instructions.  Will watch your condition.  Will get help right away if you are not doing well or get worse. Document Released: 10/05/2002 Document Revised: 10/01/2012 Document Reviewed: 06/22/2012 [facility] Patient Information 2014 [place], [place].

## 2013-08-31 NOTE — Assessment & Plan Note (Signed)
No recent flares 

## 2013-08-31 NOTE — Assessment & Plan Note (Deleted)
Still struggling with significant hypoxia on 4 liters Ox via [place]. Is trying to get in the study at [facility] to have a new procedure. No new news

## 2013-08-31 NOTE — Assessment & Plan Note (Signed)
Recent increased urinary urgency and incontinence. Will check UA and C&S. Add probiotic

## 2013-08-31 NOTE — Assessment & Plan Note (Addendum)
Still struggling with significant hypoxia on 4 liters Ox via [place]. Is trying to get in the study at [facility] to have a new procedure. No new news. Following closely with pulmonology

## 2013-08-31 NOTE — Assessment & Plan Note (Signed)
Recent flare in diarrhea. Over past 2-3 months, increased to 2-4 loose stool daily with some slight episodes of incontinence. Encouraged probiotics and fiber and check stool cultures. Has been on and off antibiotics and steroids.

## 2013-09-01 LAB — URINALYSIS
Glucose, UA: NEGATIVE mg/dL
Leukocytes, UA: NEGATIVE
Protein, ur: NEGATIVE mg/dL

## 2013-09-01 LAB — URINE CULTURE: Colony Count: NO GROWTH

## 2013-09-02 ENCOUNTER — Encounter: Payer: Self-pay | Admitting: Family Medicine

## 2013-09-02 ENCOUNTER — Telehealth: Payer: Self-pay

## 2013-09-02 DIAGNOSIS — R197 Diarrhea, unspecified: Secondary | ICD-10-CM | POA: Insufficient documentation

## 2013-09-02 DIAGNOSIS — R351 Nocturia: Secondary | ICD-10-CM

## 2013-09-02 HISTORY — DX: Nocturia: R35.1

## 2013-09-02 NOTE — Assessment & Plan Note (Signed)
Encouraged probiotics, benefiber bid, check stool cultures.

## 2013-09-02 NOTE — Assessment & Plan Note (Signed)
Her appetite has increased and her weight is up.

## 2013-09-02 NOTE — Assessment & Plan Note (Signed)
Arises 1-3 x a night. Urinalysis negative

## 2013-09-02 NOTE — Telephone Encounter (Signed)
Message copied by Eulis Manly on Wed Sep 02, 2013 12:58 PM ------      Message from: Danise Edge A      Created: Tue Sep 01, 2013  7:02 PM       Notify neg culture ------

## 2013-09-02 NOTE — Telephone Encounter (Signed)
Patient informed of results.  

## 2013-09-02 NOTE — Telephone Encounter (Signed)
Message copied by Eulis Manly on Wed Sep 02, 2013 12:06 PM ------      Message from: Danise Edge A      Created: Tue Sep 01, 2013  7:02 PM       Notify neg culture ------

## 2013-09-08 ENCOUNTER — Ambulatory Visit (INDEPENDENT_AMBULATORY_CARE_PROVIDER_SITE_OTHER): Payer: Medicare FFS | Admitting: Adult Health

## 2013-09-08 ENCOUNTER — Encounter: Payer: Self-pay | Admitting: Adult Health

## 2013-09-08 ENCOUNTER — Ambulatory Visit (HOSPITAL_BASED_OUTPATIENT_CLINIC_OR_DEPARTMENT_OTHER)
Admission: RE | Admit: 2013-09-08 | Discharge: 2013-09-08 | Disposition: A | Discharge status: Home / Self Care | Payer: Medicare FFS | Source: Ambulatory Visit | Attending: Adult Health | Admitting: Adult Health

## 2013-09-08 VITALS — BP 130/60 | HR 104 | Temp 97.5°F | Wt 102.0 lb

## 2013-09-08 DIAGNOSIS — R0989 Other specified symptoms and signs involving the circulatory and respiratory systems: Secondary | ICD-10-CM | POA: Insufficient documentation

## 2013-09-08 DIAGNOSIS — J189 Pneumonia, unspecified organism: Secondary | ICD-10-CM | POA: Insufficient documentation

## 2013-09-08 DIAGNOSIS — R05 Cough: Secondary | ICD-10-CM | POA: Insufficient documentation

## 2013-09-08 DIAGNOSIS — J449 Chronic obstructive pulmonary disease, unspecified: Secondary | ICD-10-CM | POA: Insufficient documentation

## 2013-09-08 DIAGNOSIS — J4489 Other specified chronic obstructive pulmonary disease: Secondary | ICD-10-CM | POA: Insufficient documentation

## 2013-09-08 DIAGNOSIS — R0609 Other forms of dyspnea: Secondary | ICD-10-CM | POA: Insufficient documentation

## 2013-09-08 DIAGNOSIS — J441 Chronic obstructive pulmonary disease with (acute) exacerbation: Secondary | ICD-10-CM

## 2013-09-08 DIAGNOSIS — R059 Cough, unspecified: Secondary | ICD-10-CM | POA: Insufficient documentation

## 2013-09-08 DIAGNOSIS — M899 Disorder of bone, unspecified: Secondary | ICD-10-CM | POA: Insufficient documentation

## 2013-09-08 MED ORDER — PREDNISONE 10 MG PO TABS: ORAL_TABLET | ORAL | Status: DC

## 2013-09-08 MED ORDER — DOXYCYCLINE HYCLATE 100 MG PO TABS: 100.0000 mg | ORAL_TABLET | Freq: Two times a day (BID) | ORAL | Status: DC

## 2013-09-08 NOTE — Assessment & Plan Note (Signed)
Multifocal PNA R>L PNA -CAP  CXR report showing new PNA  Will change abx for appropriate coverage -Avelox x 7 d  Pt and pharmacy called with changes  follow up in  1 week with cxr with Dr. Vassie Loll   Please contact office for sooner follow up if symptoms do not improve or worsen or seek emergency care

## 2013-09-08 NOTE — Progress Notes (Signed)
  Subjective:    Patient ID: [name], female    DOB: 05/05/1941, 72 y.o.   MRN: [id number]  HPI   18 yowf , heavy ex- smoker, quit 2002 with GOLD D COPD FEV1 26% 6/10 on 24 h O2 since 2009.  -completed pulm rehab oct '11  Recurrent flares 2-3/yr 2/13, 9/13 requiring steroid bursts  07/17/2012 CAT score 28   03/05/2013  Admitted 12/27/12 for COPD exacerbation . Tx w/ Levaquin and steroid burst. CXR w/ NAD .  ABG 7.39/55/75     05/12/13 Follow up  She requied prednisone since her admit in 5/14 &was placed on 5mg  daily since last visit Pt reports breathing has unchanged. Has occasional cough. No wheezing, no chest tx. She uses 3 liters 24/7. If she is really SOB at times she will bump up to 4L for a few minutes Likes her regimen of budesonide/brovana & atrovent nebs She has several questions concerning a COPD study involving >Lung implantable coils Wants to look into getting into this study at [facility] . Last CXR 12/27/12 w/ COPD changes, NAD   >>pred 5mg  m/w/f  07/14/13 Follow up COPD  Pt reports she has had ups and down with her breathing since last OV. She coughs about twice a day and it is clear globs of phlem. This is normally after her breathing tx. Chest gets tight when she gets SOB. She is using 3 liters O2 24/7. At time she will go up to 4 liters if she feels very SOB. She tried to take the prednisone 5 mg M,W,F for about 3 weeks and her breathing worsened so she tried taking 1/2 tablet daily and hat sitll did not help breathing so she went back up to 10 mg daily.  Remains on Brovana and Budesonide Neb Twice daily   Had colonoscopy with polyp removal without dififculty Needs flu shot today  No chest pain, orthopnea, edema or fever.  >>taper pred 5mg  daily   09/08/2013 Acute OV  Presents for an acute office visit.  Complains of over last 6 weeks with increased DOE.  Has been coughing up thick ball of mucus couple times a day. No fever . Some sinus drainage.  Has tapered down to  prednisone 5mg  daily over last month.  No exertional chest pain, orthopnea, edema , n/v/, recent travel .  Some wheezing and tightness on/off.  Feels that when she get below 10mg   Of prednisone breathing is not as good. Has turned O2 up to 4l/m with walking . Sats today 97% on arrival to office Weight is improved, eating better.      Review of Systems  neg for any significant sore throat, dysphagia, itching, sneezing, nasal congestion or excess/chills, sweats, unintended wt loss, pleuritic or exertional cp, hempoptysis, orthopnea pnd or change in chronic leg swelling. Also denies presyncope, palpitations, heartburn, abdominal pain, nausea, vomiting, diarrhea or change in bowel or urinary habits, dysuria,hematuria, rash, arthralgias, visual complaints, headache, numbness weakness or ataxia.     Objective:   Physical Exam   Gen. Pleasant, thin, in no distress ENT - no lesions, no post nasal drip Neck: No JVD, no thyromegaly, no carotid bruits Lungs: no use of accessory muscles, no dullness to percussion, decreased without rales or rhonchi  Cardiovascular: Rhythm regular, heart sounds  normal, no murmurs or gallops, no peripheral edema Musculoskeletal: No deformities, no cyanosis or clubbing        Assessment & Plan:

## 2013-09-08 NOTE — Patient Instructions (Addendum)
Doxycyline 100mg  Twice daily  For 7 days-take with food  Mucinex DM Twice daily  As needed  .cough/congestion Prednisone taper as follows : 40mg  daily x 4 days, 30mg  daily x 4 days ,  20mg  daily for 4 days , then hold at 10mg  daily .  Chest xray today .  Continue on Budesonide 0.25mg  Neb Twice daily   Continue on  [employer]ur[employer] Twice daily   Continue on Ipratropium Neb Four times a day   May use Albuterol Neb every 4hr as needed.  Saline nasal rinses and saline gel As needed   Please contact office for sooner follow up if symptoms do not improve or worsen or seek emergency care  Follow up with Dr. Vassie Loll next week as planned and As needed     Late ADD :  PNA on xray , will change to Avelox daily x 7 days.  Pt called and rx sent to pharmacy.  Ov in 1 week with cxr  Please contact office for sooner follow up if symptoms do not improve or worsen or seek emergency care

## 2013-09-08 NOTE — Assessment & Plan Note (Signed)
Flare  Check cxr today   Plan  Doxycyline 100mg  Twice daily  For 7 days-take with food  Mucinex DM Twice daily  As needed  .cough/congestion Prednisone taper as follows : 40mg  daily x 4 days, 30mg  daily x 4 days ,  20mg  daily for 4 days , then hold at 10mg  daily .  Chest xray today .  Continue on Budesonide 0.25mg  Neb Twice daily   Continue on  [employer]ur[employer] Twice daily   Continue on Ipratropium Neb Four times a day   May use Albuterol Neb every 4hr as needed.  Saline nasal rinses and saline gel As needed   Please contact office for sooner follow up if symptoms do not improve or worsen or seek emergency care  Follow up with Dr. Vassie Loll next week as planned and As needed

## 2013-09-09 ENCOUNTER — Ambulatory Visit: Payer: Medicare FFS | Admitting: Adult Health

## 2013-09-15 ENCOUNTER — Ambulatory Visit (INDEPENDENT_AMBULATORY_CARE_PROVIDER_SITE_OTHER): Payer: Medicare FFS | Admitting: Pulmonary Disease

## 2013-09-15 ENCOUNTER — Encounter: Payer: Self-pay | Admitting: Pulmonary Disease

## 2013-09-15 ENCOUNTER — Ambulatory Visit (HOSPITAL_BASED_OUTPATIENT_CLINIC_OR_DEPARTMENT_OTHER)
Admission: RE | Admit: 2013-09-15 | Discharge: 2013-09-15 | Disposition: A | Discharge status: Home / Self Care | Payer: Medicare FFS | Source: Ambulatory Visit | Attending: Pulmonary Disease | Admitting: Pulmonary Disease

## 2013-09-15 VITALS — BP 106/70 | HR 88 | Temp 97.8°F | Ht 61.0 in | Wt 104.0 lb

## 2013-09-15 DIAGNOSIS — J189 Pneumonia, unspecified organism: Secondary | ICD-10-CM

## 2013-09-15 DIAGNOSIS — J449 Chronic obstructive pulmonary disease, unspecified: Secondary | ICD-10-CM | POA: Insufficient documentation

## 2013-09-15 DIAGNOSIS — J441 Chronic obstructive pulmonary disease with (acute) exacerbation: Secondary | ICD-10-CM

## 2013-09-15 DIAGNOSIS — J4489 Other specified chronic obstructive pulmonary disease: Secondary | ICD-10-CM | POA: Insufficient documentation

## 2013-09-15 NOTE — Progress Notes (Signed)
  Subjective:    Patient ID: [name], female    DOB: 1941/01/23, 72 y.o.   MRN: [id number]  HPI  53 yowf , heavy ex- smoker, quit 2002 with GOLD D COPD FEV1 26% 6/10 on 24 h O2 since 2009.  -completed pulm rehab oct '11  Recurrent flares 2-3/yr requiring steroid bursts  Admitted 12/27/12 for COPD exacerbation . Tx w/ Levaquin and steroid burst. ABG 7.39/55/75  She required low dose  prednisone since her admit in 5/14  She uses 3 liters 24/7. If she is really SOB at times she will bump up to 4L for a few minutes  On a regimen of budesonide/brovana & atrovent nebs  Had colonoscopy with polyp removal 06/2013   09/15/13   Acute OV 1 wk ago for  increased DOE.  CXR probable multifocal bilateral pneumonia. Treated with avelox & prednisone  Chief Complaint  Patient presents with  . Follow-up    Pt c/o SOB, burning under shoulder blades.  Pt states her symptoms are improving, but slowly.    She has several questions concerning a COPD study involving Lung implantable coils at [facility] -has not heard back yet. We called  - Seems that they had her age wrong Today's CXR  -some improvement in infx  Review of Systems neg for any significant sore throat, dysphagia, itching, sneezing, nasal congestion or excess/ purulent secretions, fever, chills, sweats, unintended wt loss, pleuritic or exertional cp, hempoptysis, orthopnea pnd or change in chronic leg swelling. Also denies presyncope, palpitations, heartburn, abdominal pain, nausea, vomiting, diarrhea or change in bowel or urinary habits, dysuria,hematuria, rash, arthralgias, visual complaints, headache, numbness weakness or ataxia.     Objective:   Physical Exam  Gen. Pleasant,thin woman, in no distress, on nasal O2 ENT - no lesions, no post nasal drip Neck: No JVD, no thyromegaly, no carotid bruits Lungs: no use of accessory muscles, no dullness to percussion, decerased without rales or rhonchi  Cardiovascular: Rhythm regular, heart  sounds  normal, no murmurs or gallops, no peripheral edema Musculoskeletal: No deformities, no cyanosis or clubbing        Assessment & Plan:

## 2013-09-15 NOTE — Patient Instructions (Signed)
Complete prednisone CXR today We will contact [facility] about research study

## 2013-09-16 NOTE — Assessment & Plan Note (Signed)
Seems to exacerbate off steroids - consider low dose 5mg  prednisone tiw if another flare

## 2013-09-16 NOTE — Assessment & Plan Note (Signed)
Follow up CXR >improved Rpt CXR in 6 wks for resolution

## 2013-09-21 ENCOUNTER — Other Ambulatory Visit: Payer: Self-pay | Admitting: Family Medicine

## 2013-09-22 LAB — CLOSTRIDIUM DIFFICILE BY PCR: Toxigenic C. Difficile by PCR: NOT DETECTED

## 2013-09-22 LAB — OVA AND PARASITE EXAMINATION

## 2013-09-22 LAB — FECAL LACTOFERRIN, QUANT: Lactoferrin: NEGATIVE

## 2013-09-23 ENCOUNTER — Telehealth: Payer: Self-pay | Admitting: *Deleted

## 2013-09-23 MED ORDER — FLUCONAZOLE 150 MG PO TABS: ORAL_TABLET | ORAL | Status: DC

## 2013-09-23 NOTE — Telephone Encounter (Signed)
Message copied by Kathi Simpers on Wed Sep 23, 2013  3:49 PM ------      Message from: Danise Edge A      Created: Tue Sep 22, 2013 12:53 PM       Notify so far only yeast seen in stool culture. Start Diflucan 150 mg tabs 2 tabs po daily x 3 days then 1 tab po weekly x 4 weeks ------

## 2013-09-23 NOTE — Telephone Encounter (Signed)
Notified pt, she voices understanding, Rx sent.

## 2013-09-25 LAB — STOOL CULTURE

## 2013-10-20 ENCOUNTER — Other Ambulatory Visit: Payer: Self-pay | Admitting: Pulmonary Disease

## 2013-10-20 ENCOUNTER — Ambulatory Visit (HOSPITAL_BASED_OUTPATIENT_CLINIC_OR_DEPARTMENT_OTHER)
Admission: RE | Admit: 2013-10-20 | Discharge: 2013-10-20 | Disposition: A | Discharge status: Home / Self Care | Payer: Medicare FFS | Source: Ambulatory Visit | Attending: Pulmonary Disease | Admitting: Pulmonary Disease

## 2013-10-20 DIAGNOSIS — J4489 Other specified chronic obstructive pulmonary disease: Secondary | ICD-10-CM | POA: Insufficient documentation

## 2013-10-20 DIAGNOSIS — J449 Chronic obstructive pulmonary disease, unspecified: Secondary | ICD-10-CM | POA: Insufficient documentation

## 2013-10-20 DIAGNOSIS — J189 Pneumonia, unspecified organism: Secondary | ICD-10-CM

## 2013-10-21 ENCOUNTER — Other Ambulatory Visit: Payer: Self-pay | Admitting: *Deleted

## 2013-11-30 ENCOUNTER — Ambulatory Visit (INDEPENDENT_AMBULATORY_CARE_PROVIDER_SITE_OTHER): Payer: Medicare FFS | Admitting: Family Medicine

## 2013-11-30 ENCOUNTER — Encounter: Payer: Self-pay | Admitting: Family Medicine

## 2013-11-30 VITALS — BP 138/84 | HR 105 | Temp 98.2°F | Ht 61.0 in | Wt 107.0 lb

## 2013-11-30 DIAGNOSIS — D649 Anemia, unspecified: Secondary | ICD-10-CM

## 2013-11-30 DIAGNOSIS — I10 Essential (primary) hypertension: Secondary | ICD-10-CM

## 2013-11-30 DIAGNOSIS — J441 Chronic obstructive pulmonary disease with (acute) exacerbation: Secondary | ICD-10-CM

## 2013-11-30 DIAGNOSIS — R7309 Other abnormal glucose: Secondary | ICD-10-CM

## 2013-11-30 DIAGNOSIS — R739 Hyperglycemia, unspecified: Secondary | ICD-10-CM

## 2013-11-30 DIAGNOSIS — R32 Unspecified urinary incontinence: Secondary | ICD-10-CM

## 2013-11-30 LAB — CBC
HEMATOCRIT: 38.2 % (ref 36.0–46.0)
HEMOGLOBIN: 12.3 g/dL (ref 12.0–15.0)
MCH: 27.9 pg (ref 26.0–34.0)
MCHC: 32.2 g/dL (ref 30.0–36.0)
MCV: 86.6 fL (ref 78.0–100.0)
Platelets: 436 10*3/uL — ABNORMAL HIGH (ref 150–400)
RBC: 4.41 MIL/uL (ref 3.87–5.11)
RDW: 16.3 % — ABNORMAL HIGH (ref 11.5–15.5)
WBC: 13.7 10*3/uL — ABNORMAL HIGH (ref 4.0–10.5)

## 2013-11-30 NOTE — Progress Notes (Signed)
Subjective:     Patient ID: [name], female   DOB: 1941/04/30, 73 y.o.   MRN: [id number]  CC: 3 mo f/u with c/o diarrhea  Diarrhea  This is a new problem. The current episode started more than 1 month ago. The problem has been gradually improving. The stool consistency is described as watery. Pertinent negatives include no chills, coughing, fever, sweats or weight loss. The symptoms are aggravated by diary products and stress. Risk factors include recent antibiotic use, recent hospitalization and suspect food intake. She has tried increased fluids for the symptoms. The treatment provided mild relief.    Pt presents today for f/u with c/o diarrhea. At her last visit 08/31/2013 she was experiencing diarrhea and fecal urgency. Since November she has used Social worker. She notes a significant improvement in the last 2 weeks. She denies blood in her stool. She had one episode last week of fecal incontinence.   Pt sees pulmonologist regularly and had CAP in 09/2013 that has since resolved. She states her major symptoms were nasal and head congestion and malaise. She denies cough or congestion today. She feels much better and energetic. Pt reports her pulmonologist has sent her information to [facility] for potential interest in a study on COPD patients.   Review of Systems  Constitutional: Negative for fever, chills, weight loss and fatigue.  HENT: Negative for congestion and sinus pressure.   Respiratory: Negative for cough.   Gastrointestinal: Positive for diarrhea. Negative for constipation and blood in stool.  Genitourinary: Positive for urgency and frequency. Negative for dysuria.  Psychiatric/Behavioral: Negative for sleep disturbance.   Patient Active Problem List   Diagnosis Date Noted  . COPD exacerbation 09/08/2013  . CAP (community acquired pneumonia) 09/08/2013  . Nocturia 09/02/2013  . Diarrhea 09/02/2013  . Benign neoplasm of colon 06/08/2013  .  Normocytic anemia 12/28/2012  . Atrophic vaginitis 12/07/2012  . Cervical cancer screening 12/02/2012  . UTI (lower urinary tract infection) 06/07/2012  . Anemia 04/19/2012  . Headache 04/19/2012  . Right shoulder pain 01/26/2011  . THRUSH 05/30/2010  . FECAL INCONTINENCE 02/28/2009  . COLONIC POLYPS, ADENOMATOUS, HX OF 02/24/2009  . SMALL BOWEL OBSTRUCTION, HX OF 02/24/2009  . ARTHRALGIA 09/21/2008  . PALLOR 07/02/2008  . OSTEOPOROSIS 06/25/2008  . WEIGHT LOSS 04/08/2008  . SMALL BOWEL OBSTRUCTION 02/04/2008  . HYPOTHYROIDISM 10/27/2007  . HOARSENESS 10/27/2007  . ANXIETY 10/17/2007  . DEPRESSION 10/17/2007  . HYPERTENSION, BENIGN ESSENTIAL 07/31/2007  . GERD 07/31/2007   Family History  Problem Relation Age of Onset  . Alcohol abuse Mother   . Heart disease Mother   . Stroke Mother   . Hypertension Mother   . Kidney disease Mother   . COPD Mother   . Osteoporosis Mother     hip fracture  . COPD Brother   . Alcohol abuse Brother     drug abuse  . Hearing loss Brother     mvp  . Colon cancer Paternal Aunt   . Prostate cancer    . Alcohol abuse Father     MVA  . Thyroid disease Daughter   . COPD Paternal Grandfather   . COPD Sister   . Hypertension Sister   . Anxiety disorder Sister   . COPD Sister   . COPD Brother     current smoker  . Hearing loss Brother     cad  . COPD Brother    History   Social History  . Marital Status:  Married    Spouse Name: N/A    Number of Children: 1  . Years of Education: N/A   Occupational History  . Retired Hydrologist    Social History Main Topics  . Smoking status: Former Smoker -- 2.00 packs/day for 40 years    Types: Cigarettes    Quit date: 10/29/2001  . Smokeless tobacco: Never Used  . Alcohol Use: No  . Drug Use: No  . Sexual Activity: Not on file   Other Topics Concern  . Not on file   Social History Narrative  . No narrative on file   Current Outpatient Prescriptions on File Prior  to Visit  Medication Sig Dispense Refill  . albuterol (PROVENTIL HFA;VENTOLIN HFA) 108 (90 BASE) MCG/ACT inhaler Inhale 2 puffs into the lungs every 6 (six) hours as needed for wheezing or shortness of breath.       Marland Kitchen albuterol (PROVENTIL) (2.5 MG/3ML) 0.083% nebulizer solution Take 3 mLs (2.5 mg total) by nebulization every 4 (four) hours as needed for wheezing or shortness of breath. Dx 496  300 mL  3  . ALPRAZolam (XANAX) 0.5 MG tablet Take 0.5 mg by mouth as directed. Take 1 tablet by mouth every morning and take 2 tablets by mouth at bedtime.      Marland Kitchen arformoterol (BROVANA) 15 MCG/2ML NEBU Take 2 mLs (15 mcg total) by nebulization 2 (two) times daily. Dx 496  360 mL  3  . budesonide (PULMICORT) 0.25 MG/2ML nebulizer solution Take 2 mLs (0.25 mg total) by nebulization 2 (two) times daily. Dx: 496  360 mL  3  . Calcium Carbonate-Vitamin D 600-400 MG-UNIT per tablet Take 1 tablet by mouth 2 (two) times daily.        . Coenzyme Q10 ([employer] COENZYME Q10) 100 MG capsule Take 100 mg by mouth daily.        Marland Kitchen dextromethorphan-guaiFENesin (MUCINEX DM) 30-600 MG per 12 hr tablet Take 1 tablet by mouth 2 (two) times daily as needed (for congestion).       . fexofenadine (ALLEGRA) 180 MG tablet Take 180 mg by mouth daily as needed (for allergies).       . Glucosamine-Chondroitin (GLUCOSAMINE CHONDR COMPLEX PO) Take 1 tablet by mouth 2 (two) times daily.       Marland Kitchen ipratropium (ATROVENT) 0.02 % nebulizer solution Take 2.5 mLs (500 mcg total) by nebulization 4 (four) times daily. Dx 496  900 mL  3  . mirtazapine (REMERON) 15 MG tablet Take 1 tablet (15 mg total) by mouth at bedtime.  90 tablet  1  . omeprazole-sodium bicarbonate (ZEGERID) 40-1100 MG per capsule Take 1 capsule by mouth 2 (two) times daily.  180 capsule  3  . benzonatate (TESSALON) 200 MG capsule Take 1 capsule (200 mg total) by mouth 3 (three) times daily as needed for cough.  30 capsule  5   No current facility-administered medications on file prior  to visit.   Allergies  Allergen Reactions  . Morphine     REACTION: nightmares       Objective:   Physical Exam  Constitutional: She is oriented to person, place, and time. She appears well-developed and well-nourished. No distress.  HENT:  Head: Normocephalic and atraumatic.  Mouth/Throat: Mucous membranes are dry. No posterior oropharyngeal edema or posterior oropharyngeal erythema.  Eyes: Lids are normal. Right eye exhibits no discharge. Left eye exhibits no discharge.  Cardiovascular: Regular rhythm and normal heart sounds.   Pulmonary/Chest: Effort normal and breath  sounds normal.  Pt is on 4 L O2.  Neurological: She is alert and oriented to person, place, and time.  Skin: No rash noted.  Psychiatric: She has a normal mood and affect.   Repeat BP 11/30/2013 Right arm- 128/82  Wt Readings from Last 3 Encounters:  11/30/13 107 lb (48.535 kg)  09/15/13 104 lb (47.174 kg)  09/08/13 102 lb (46.267 kg)   Temp Readings from Last 3 Encounters:  11/30/13 98.2 F (36.8 C) Oral  09/15/13 97.8 F (36.6 C) Oral  09/08/13 97.5 F (36.4 C) Oral   BP Readings from Last 3 Encounters:  11/30/13 138/84  09/15/13 106/70  09/08/13 130/60   Pulse Readings from Last 3 Encounters:  11/30/13 105  09/15/13 88  09/08/13 104   CBC    Component Value Date/Time   WBC 11.6* 08/31/2013 1416   RBC 4.38 08/31/2013 1416   HGB 12.1 08/31/2013 1416   HCT 37.6 08/31/2013 1416   PLT 353 08/31/2013 1416   MCV 85.8 08/31/2013 1416   MCH 27.6 08/31/2013 1416   MCHC 32.2 08/31/2013 1416   RDW 15.9* 08/31/2013 1416   LYMPHSABS 2.9 04/05/2011 1014   MONOABS 0.9 04/05/2011 1014   EOSABS 0.2 04/05/2011 1014   BASOSABS 0.1 04/05/2011 1014       Assessment:       1. Diarrhea resolving gradually 2. COPD, severe.  3. Resolved anemia 4. Elevated BUN levels     Plan:       1. Diarrhea, resolving - Pt was educated about probiotics and states she would like to continue to take Digestive Advantage and  Benefiber. Discussed avoidance of high-fat dairy products and encouraged fluids. 2. COPD, severe - pt f/u with pulmonologist 3. Resolved anemia 4. Labs today include CBC, U/A + culture, renal panel, HgbA1c, TSH. 5. F/u 3 mo.   02.02.2015  [place] Carina Chaplin, PA-S.  Patient seen, interviewed and examined with student agree with documentation

## 2013-11-30 NOTE — Patient Instructions (Signed)
Mild, avoid caffeine, minimize sodium   Nonspecific Tachycardia Tachycardia is a faster than normal heartbeat (more than 100 beats per minute). In adults, the heart normally beats between 60 and 100 times a minute. A fast heartbeat may be a normal response to exercise or stress. It does not necessarily mean that something is wrong. However, sometimes when your heart beats too fast it may not be able to pump enough blood to the rest of your body. This can result in chest pain, shortness of breath, dizziness, and even fainting. Nonspecific tachycardia means that the specific cause or pattern of your tachycardia is unknown. CAUSES  Tachycardia may be harmless or it may be due to a more serious underlying cause. Possible causes of tachycardia include:  Exercise or exertion.  Fever.  Pain or injury.  Infection.  Loss of body fluids (dehydration).  Overactive thyroid.  Lack of red blood cells (anemia).  Anxiety and stress.  Alcohol.  Caffeine.  Tobacco products.  Diet pills.  Illegal drugs.  Heart disease. SYMPTOMS  Rapid or irregular heartbeat (palpitations).  Suddenly feeling your heart beating (cardiac awareness).  Dizziness.  Tiredness (fatigue).  Shortness of breath.  Chest pain.  Nausea.  Fainting. DIAGNOSIS  Your caregiver will perform a physical exam and take your medical history. In some cases, a heart specialist (cardiologist) may be consulted. Your caregiver may also order:  Blood tests.  Electrocardiography. This test records the electrical activity of your heart.  A heart monitoring test. TREATMENT  Treatment will depend on the likely cause of your tachycardia. The goal is to treat the underlying cause of your tachycardia. Treatment methods may include:  Replacement of fluids or blood through an intravenous (IV) tube for moderate to severe dehydration or anemia.  New medicines or changes in your current medicines.  Diet and lifestyle  changes.  Treatment for certain infections.  Stress relief or relaxation methods. HOME CARE INSTRUCTIONS   Rest.  Drink enough fluids to keep your urine clear or pale yellow.  Do not smoke.  Avoid:  Caffeine.  Tobacco.  Alcohol.  Chocolate.  Stimulants such as over-the-counter diet pills or pills that help you stay awake.  Situations that cause anxiety or stress.  Illegal drugs such as marijuana, phencyclidine (PCP), and cocaine.  Only take medicine as directed by your caregiver.  Keep all follow-up appointments as directed by your caregiver. SEEK IMMEDIATE MEDICAL CARE IF:   You have pain in your chest, upper arms, jaw, or neck.  You become weak, dizzy, or feel faint.  You have palpitations that will not go away.  You vomit, have diarrhea, or pass blood in your stool.  Your skin is cool, pale, and wet.  You have a fever that will not go away with rest, fluids, and medicine. MAKE SURE YOU:   Understand these instructions.  Will watch your condition.  Will get help right away if you are not doing well or get worse. Document Released: 11/22/2004 Document Revised: 01/07/2012 Document Reviewed: 09/25/2011 [facility] Patient Information 2014 [place], [place].

## 2013-11-30 NOTE — Progress Notes (Signed)
Pre visit review using our clinic review tool, if applicable. No additional management support is needed unless otherwise documented below in the visit note. 

## 2013-12-01 ENCOUNTER — Telehealth: Payer: Self-pay | Admitting: Family Medicine

## 2013-12-01 ENCOUNTER — Encounter: Payer: Self-pay | Admitting: Family Medicine

## 2013-12-01 DIAGNOSIS — R739 Hyperglycemia, unspecified: Secondary | ICD-10-CM | POA: Insufficient documentation

## 2013-12-01 HISTORY — DX: Hyperglycemia, unspecified: R73.9

## 2013-12-01 LAB — URINALYSIS
BILIRUBIN URINE: NEGATIVE
Glucose, UA: NEGATIVE mg/dL
Hgb urine dipstick: NEGATIVE
KETONES UR: NEGATIVE mg/dL
Leukocytes, UA: NEGATIVE
Nitrite: NEGATIVE
PROTEIN: NEGATIVE mg/dL
Specific Gravity, Urine: 1.011 (ref 1.005–1.030)
Urobilinogen, UA: 0.2 mg/dL (ref 0.0–1.0)
pH: 7 (ref 5.0–8.0)

## 2013-12-01 LAB — RENAL FUNCTION PANEL
Albumin: 4.3 g/dL (ref 3.5–5.2)
BUN: 24 mg/dL — AB (ref 6–23)
CO2: 34 meq/L — AB (ref 19–32)
Calcium: 9.8 mg/dL (ref 8.4–10.5)
Chloride: 98 mEq/L (ref 96–112)
Creat: 0.5 mg/dL (ref 0.50–1.10)
GLUCOSE: 91 mg/dL (ref 70–99)
POTASSIUM: 4.1 meq/L (ref 3.5–5.3)
Phosphorus: 4.5 mg/dL (ref 2.3–4.6)
Sodium: 144 mEq/L (ref 135–145)

## 2013-12-01 LAB — URINE CULTURE
Colony Count: NO GROWTH
Organism ID, Bacteria: NO GROWTH

## 2013-12-01 LAB — HEMOGLOBIN A1C
Hgb A1c MFr Bld: 6 % — ABNORMAL HIGH (ref <5.7)
MEAN PLASMA GLUCOSE: 126 mg/dL — AB (ref <117)

## 2013-12-01 LAB — TSH: TSH: 2.665 u[IU]/mL (ref 0.350–4.500)

## 2013-12-01 NOTE — Assessment & Plan Note (Signed)
resolved 

## 2013-12-01 NOTE — Assessment & Plan Note (Signed)
Improving patient feels much better, will continue O2 via [place] and has follow up appt with pulmonology

## 2013-12-01 NOTE — Assessment & Plan Note (Signed)
Well controlled no changes 

## 2013-12-01 NOTE — Telephone Encounter (Signed)
Relevant patient education assigned to patient using Emmi. ° °

## 2013-12-01 NOTE — Assessment & Plan Note (Signed)
Repeat hgba1c, avoid simple carbs

## 2013-12-10 ENCOUNTER — Ambulatory Visit (INDEPENDENT_AMBULATORY_CARE_PROVIDER_SITE_OTHER): Payer: Medicare FFS | Admitting: Pulmonary Disease

## 2013-12-10 ENCOUNTER — Encounter: Payer: Self-pay | Admitting: Pulmonary Disease

## 2013-12-10 VITALS — BP 108/66 | HR 97 | Ht 59.0 in | Wt 106.0 lb

## 2013-12-10 DIAGNOSIS — J441 Chronic obstructive pulmonary disease with (acute) exacerbation: Secondary | ICD-10-CM

## 2013-12-10 DIAGNOSIS — J189 Pneumonia, unspecified organism: Secondary | ICD-10-CM

## 2013-12-10 DIAGNOSIS — J9610 Chronic respiratory failure, unspecified whether with hypoxia or hypercapnia: Secondary | ICD-10-CM

## 2013-12-10 NOTE — Assessment & Plan Note (Signed)
resolved 

## 2013-12-10 NOTE — Patient Instructions (Signed)
Prevnar shot today Pulm rehab referral We discussed new study about triple combination Decrease prednisone 5mg  M/W/F Take pulmicort & brovana twice daily Ok to decrease albuterol/atrovent nebs to every 6h as needed

## 2013-12-10 NOTE — Assessment & Plan Note (Signed)
Prevnar shot today Pulm rehab referral We discussed new study about triple combination Decrease prednisone 5mg M/W/F Take pulmicort & brovana twice daily Ok to decrease albuterol/atrovent nebs to every 6h as needed  

## 2013-12-10 NOTE — Progress Notes (Signed)
   Subjective:    Patient ID: [name], female    DOB: 04/18/1941, 73 y.o.   MRN: [id number]  HPI  21 yowf , heavy ex- smoker, quit 2002 with GOLD D COPD FEV1 26% 6/10 on 24 h O2 since 2009.  -completed pulm rehab oct '11  Recurrent flares 2-3/yr requiring steroid bursts  Admitted 12/27/12 for COPD exacerbation . Tx w/ Levaquin and steroid burst. ABG 7.39/55/75  She required low dose prednisone since her admit in 5/14  She uses 3 liters 24/7. If she is really SOB at times she will bump up to 4L for a few minutes  On a regimen of budesonide/brovana & alb/atrovent nebs  Had colonoscopy with polyp removal 06/2013   12/10/2013  Chief Complaint  Patient presents with  . Follow-up    Pt c/o increased SOB, sinus congestion.  Pt believes it is allergy related.  S/s X2 wks.      08/2013 CXR  multifocal bilateral pneumonia.  Improved with avelox & prednisone  She was turned down for a COPD study involving Lung implantable coils at [facility] due to hypercarbia Immunization reviewed, h/o recurrent pneumonia  Past Medical History  Diagnosis Date  . COPD (chronic obstructive pulmonary disease)   . Thyroid disease   . Glucose intolerance (impaired glucose tolerance)   . History of small bowel obstruction   . Arthralgia   . Dyspnea   . Pallor   . Chest pain   . Osteoporosis   . Dysuria   . Weight loss   . Edema   . Weakness   . Anxiety   . Depression   . Migraine   . Hypertension   . Leukocytosis   . GERD (gastroesophageal reflux disease)   . Atrophic vaginitis 12/07/2012  . Hx of adenomatous colonic polyps   . Ischemic colitis   . Nocturia 09/02/2013  . Diarrhea 09/02/2013  . Hyperglycemia 12/01/2013     Review of Systems neg for any significant sore throat, dysphagia, itching, sneezing, nasal congestion or excess/ purulent secretions, fever, chills, sweats, unintended wt loss, pleuritic or exertional cp, hempoptysis, orthopnea pnd or change in chronic leg swelling. Also denies  presyncope, palpitations, heartburn, abdominal pain, nausea, vomiting, diarrhea or change in bowel or urinary habits, dysuria,hematuria, rash, arthralgias, visual complaints, headache, numbness weakness or ataxia.     Objective:   Physical Exam  Gen. Pleasant, thin, in no distress, normal affect ENT - no lesions, no post nasal drip Neck: No JVD, no thyromegaly, no carotid bruits Lungs: no use of accessory muscles, no dullness to percussion, decreased without rales or rhonchi  Cardiovascular: Rhythm regular, heart sounds  normal, no murmurs or gallops, no peripheral edema Abdomen: soft and non-tender, no hepatosplenomegaly, BS normal. Musculoskeletal: No deformities, no cyanosis or clubbing Neuro:  alert, non focal       Assessment & Plan:

## 2013-12-11 ENCOUNTER — Encounter (HOSPITAL_COMMUNITY): Payer: Self-pay

## 2013-12-11 ENCOUNTER — Telehealth: Payer: Self-pay | Admitting: Pulmonary Disease

## 2013-12-11 NOTE — Telephone Encounter (Signed)
I was able to find PFT for [facility].  Nothing further needed at this time.  Satira Anis

## 2013-12-11 NOTE — Progress Notes (Unsigned)
I have called and left a message with Charlcie to inquire about participation in Pulmonary Rehab. Will send letter in mail and follow up.  

## 2013-12-14 ENCOUNTER — Telehealth (HOSPITAL_COMMUNITY): Payer: Self-pay

## 2013-12-14 NOTE — Telephone Encounter (Signed)
Called patient to inquire about Pulmonary Rehab.  Patient is interested in program but wants to wait to get started until after she takes a trip to [place]. Patient is going to check with insurance company regarding coverage and call me back to set up an orientation date.

## 2013-12-16 ENCOUNTER — Ambulatory Visit: Payer: Medicare FFS | Admitting: Pulmonary Disease

## 2013-12-22 ENCOUNTER — Other Ambulatory Visit: Payer: Self-pay

## 2013-12-22 DIAGNOSIS — G4700 Insomnia, unspecified: Secondary | ICD-10-CM

## 2013-12-22 MED ORDER — MIRTAZAPINE 15 MG PO TABS: 15.0000 mg | ORAL_TABLET | Freq: Every day | ORAL | Status: DC

## 2013-12-22 NOTE — Telephone Encounter (Signed)
Pt left a message stating that she needs a written RX to pick up for Mirtazapine? Last RX was wrote on 06-01-13 quantity 90 with 1 refill

## 2013-12-22 NOTE — Telephone Encounter (Signed)
Pt informed that RX is ready to be picked up

## 2014-01-11 ENCOUNTER — Telehealth (HOSPITAL_COMMUNITY): Payer: Self-pay

## 2014-01-11 NOTE — Telephone Encounter (Signed)
I have called and left a message with Isabella to inquire about participation in Pulmonary Rehab. Will send letter in mail and follow up.  

## 2014-01-20 ENCOUNTER — Ambulatory Visit: Payer: Medicare FFS | Admitting: Pulmonary Disease

## 2014-01-21 ENCOUNTER — Ambulatory Visit: Payer: Medicare FFS | Admitting: Adult Health

## 2014-01-21 ENCOUNTER — Ambulatory Visit (INDEPENDENT_AMBULATORY_CARE_PROVIDER_SITE_OTHER): Payer: Medicare FFS | Admitting: Pulmonary Disease

## 2014-01-21 ENCOUNTER — Encounter: Payer: Self-pay | Admitting: Pulmonary Disease

## 2014-01-21 VITALS — BP 118/76 | HR 89 | Ht 61.0 in | Wt 105.0 lb

## 2014-01-21 DIAGNOSIS — J441 Chronic obstructive pulmonary disease with (acute) exacerbation: Secondary | ICD-10-CM

## 2014-01-21 NOTE — Assessment & Plan Note (Addendum)
Wonder if she is getting hypercarbic in am Drop to 2L Oxygen during sleep We will check O2 satn during sleep Stay on pulmicort & brovana

## 2014-01-21 NOTE — Patient Instructions (Signed)
Drop to 2L Oxygen during sleep We will check O2 levels during sleep Stay on pulmicort & brovana

## 2014-01-21 NOTE — Progress Notes (Signed)
   Subjective:    Patient ID: [name], female    DOB: 03-Jun-1941, 73 y.o.   MRN: [id number]  HPI  22 yowf , heavy ex- smoker, quit 2002 with GOLD D COPD FEV1 26% 6/10 on 24 h O2 since 2009.  -completed pulm rehab oct '11  Recurrent flares 2-3/yr requiring steroid bursts  Admitted 12/2012 for COPD exacerbation .ABG 7.39/55/75 on 3L [place]  She required low dose prednisone since her admit in 02/2013  She uses 3 liters 24/7. If she is really SOB at times she will bump up to 4L for a few minutes  On a regimen of budesonide/brovana & alb/atrovent nebs  Had colonoscopy with polyp removal 06/2013    01/21/2014  Chief Complaint  Patient presents with  . Follow-up    Pt uses 3-4 liters O2 cont 24/7. pt reports she still feels like she can't breathe but O2 readings are in the 90's. Pt reports the other night her nails turned grey but O2 levels were in the 90's. C/o slight wheezing/chest tx, coughs about once daily after breathing tx.    Reports episode where her nails turned 'grey' -satns were ok C/o morning headaches ,needing goody powder  09/2013 CXR  Improved multifocal bilateral pneumonia.   Uses prednisone alternate day    Review of Systems neg for any significant sore throat, dysphagia, itching, sneezing, nasal congestion or excess/ purulent secretions, fever, chills, sweats, unintended wt loss, pleuritic or exertional cp, hempoptysis, orthopnea pnd or change in chronic leg swelling. Also denies presyncope, palpitations, heartburn, abdominal pain, nausea, vomiting, diarrhea or change in bowel or urinary habits, dysuria,hematuria, rash, arthralgias, visual complaints, headache, numbness weakness or ataxia.     Objective:   Physical Exam  Gen. Pleasant, thin woman, in no distress ENT - no lesions, no post nasal drip Neck: No JVD, no thyromegaly, no carotid bruits Lungs: no use of accessory muscles, no dullness to percussion, decreased without rales or rhonchi  Cardiovascular:  Rhythm regular, heart sounds  normal, no murmurs or gallops, no peripheral edema Musculoskeletal: No deformities, no cyanosis or clubbing         Assessment & Plan:

## 2014-02-04 ENCOUNTER — Telehealth: Payer: Self-pay | Admitting: Pulmonary Disease

## 2014-02-04 NOTE — Telephone Encounter (Signed)
ONO on 2L >> no sig desatn Stay on this level

## 2014-02-04 NOTE — Telephone Encounter (Signed)
Called home # and LMTCB x1 w/ family member Called mobile # and spoke with pt. Aware of results. Nothing further needed

## 2014-03-25 ENCOUNTER — Ambulatory Visit: Payer: Medicare FFS | Admitting: Pulmonary Disease

## 2014-03-25 ENCOUNTER — Encounter: Payer: Self-pay | Admitting: Pulmonary Disease

## 2014-03-26 ENCOUNTER — Ambulatory Visit (INDEPENDENT_AMBULATORY_CARE_PROVIDER_SITE_OTHER): Payer: Medicare FFS | Admitting: Family Medicine

## 2014-03-26 ENCOUNTER — Encounter: Payer: Self-pay | Admitting: Family Medicine

## 2014-03-26 VITALS — BP 146/88 | HR 94 | Temp 97.7°F | Ht 61.0 in | Wt 102.0 lb

## 2014-03-26 DIAGNOSIS — F418 Other specified anxiety disorders: Secondary | ICD-10-CM

## 2014-03-26 DIAGNOSIS — F419 Anxiety disorder, unspecified: Principal | ICD-10-CM

## 2014-03-26 DIAGNOSIS — R739 Hyperglycemia, unspecified: Secondary | ICD-10-CM

## 2014-03-26 DIAGNOSIS — F329 Major depressive disorder, single episode, unspecified: Secondary | ICD-10-CM

## 2014-03-26 DIAGNOSIS — E039 Hypothyroidism, unspecified: Secondary | ICD-10-CM

## 2014-03-26 DIAGNOSIS — K219 Gastro-esophageal reflux disease without esophagitis: Secondary | ICD-10-CM

## 2014-03-26 DIAGNOSIS — I10 Essential (primary) hypertension: Secondary | ICD-10-CM

## 2014-03-26 DIAGNOSIS — F341 Dysthymic disorder: Secondary | ICD-10-CM

## 2014-03-26 DIAGNOSIS — J441 Chronic obstructive pulmonary disease with (acute) exacerbation: Secondary | ICD-10-CM

## 2014-03-26 DIAGNOSIS — G4700 Insomnia, unspecified: Secondary | ICD-10-CM

## 2014-03-26 DIAGNOSIS — R7309 Other abnormal glucose: Secondary | ICD-10-CM

## 2014-03-26 MED ORDER — CITALOPRAM HYDROBROMIDE 20 MG PO TABS: 20.0000 mg | ORAL_TABLET | Freq: Every day | ORAL | Status: DC

## 2014-03-26 MED ORDER — MIRTAZAPINE 15 MG PO TABS: 15.0000 mg | ORAL_TABLET | Freq: Every day | ORAL | Status: DC

## 2014-03-26 MED ORDER — ALPRAZOLAM 0.5 MG PO TABS: ORAL_TABLET | ORAL | Status: DC

## 2014-03-26 NOTE — Progress Notes (Signed)
Pre visit review using our clinic review tool, if applicable. No additional management support is needed unless otherwise documented below in the visit note. 

## 2014-03-26 NOTE — Patient Instructions (Signed)

## 2014-03-26 NOTE — Progress Notes (Signed)
Patient ID: [name], female   DOB: 11-05-1940, 73 y.o.   MRN: [id number] NORA SABEY [id number] [birth year] 03/26/2014      Progress Note-Follow Up  Subjective  Chief Complaint  Chief Complaint  Patient presents with  . Follow-up    medication "messing" her up    HPI  Patient is a 73 year old female in today for routine medical care. Patient is in today reporting worsening anxiety and depression with recent Cymbalta and being used. Would like to change meds. Her anxiety got better when she stopped them. She's having trouble sleeping. She denies chest pain or palpitations. No recent illness. . Denies CP/palp/SOB/HA/congestion/fevers/GI or GU c/o. Taking meds as prescribed  Past Medical History  Diagnosis Date  . COPD (chronic obstructive pulmonary disease)   . Thyroid disease   . Glucose intolerance (impaired glucose tolerance)   . History of small bowel obstruction   . Arthralgia   . Dyspnea   . Pallor   . Chest pain   . Osteoporosis   . Dysuria   . Weight loss   . Edema   . Weakness   . Anxiety   . Depression   . Migraine   . Hypertension   . Leukocytosis   . GERD (gastroesophageal reflux disease)   . Atrophic vaginitis 12/07/2012  . Hx of adenomatous colonic polyps   . Ischemic colitis   . Nocturia 09/02/2013  . Diarrhea 09/02/2013  . Hyperglycemia 12/01/2013    Past Surgical History  Procedure Laterality Date  . Cholecystectomy    . Abdominal hysterectomy  1993  . Tubal ligation    . Bladder repair    . Dilation and curettage of uterus      40 yrs ago  . Eye surgery  04/2011    bil. eyes  . Colonoscopy with propofol N/A 06/08/2013    Procedure: COLONOSCOPY WITH PROPOFOL;  Surgeon: Lafayette Dragon, MD;  Location: [facility];  Service: Endoscopy;  Laterality: N/A;    Family History  Problem Relation Age of Onset  . Alcohol abuse Mother   . Heart disease Mother   . Stroke Mother   . Hypertension Mother   . Kidney disease Mother   . COPD Mother    . Osteoporosis Mother     hip fracture  . COPD Brother   . Alcohol abuse Brother     drug abuse  . Hearing loss Brother     mvp  . Colon cancer Paternal Aunt   . Prostate cancer    . Alcohol abuse Father     MVA  . Thyroid disease Daughter   . COPD Paternal Grandfather   . COPD Sister   . Hypertension Sister   . Anxiety disorder Sister   . COPD Sister   . COPD Brother     current smoker  . Hearing loss Brother     cad  . COPD Brother     History   Social History  . Marital Status: Married    Spouse Name: N/A    Number of Children: 1  . Years of Education: N/A   Occupational History  . Retired Hydrologist    Social History Main Topics  . Smoking status: Former Smoker -- 2.00 packs/day for 40 years    Types: Cigarettes    Quit date: 10/29/2001  . Smokeless tobacco: Never Used  . Alcohol Use: No  . Drug Use: No  . Sexual Activity: Not on file  Other Topics Concern  . Not on file   Social History Narrative  . No narrative on file    Current Outpatient Prescriptions on File Prior to Visit  Medication Sig Dispense Refill  . albuterol (PROVENTIL HFA;VENTOLIN HFA) 108 (90 BASE) MCG/ACT inhaler Inhale 2 puffs into the lungs every 6 (six) hours as needed for wheezing or shortness of breath.       Marland Kitchen albuterol (PROVENTIL) (2.5 MG/3ML) 0.083% nebulizer solution Take 3 mLs (2.5 mg total) by nebulization every 4 (four) hours as needed for wheezing or shortness of breath. Dx 496  300 mL  3  . ALPRAZolam (XANAX) 0.5 MG tablet Take 0.5 mg by mouth as directed. Take 1 tablet by mouth every morning and take 2 tablets by mouth at bedtime.      Marland Kitchen arformoterol (BROVANA) 15 MCG/2ML NEBU Take 2 mLs (15 mcg total) by nebulization 2 (two) times daily. Dx 496  360 mL  3  . benzonatate (TESSALON) 200 MG capsule Take 1 capsule (200 mg total) by mouth 3 (three) times daily as needed for cough.  30 capsule  5  . budesonide (PULMICORT) 0.25 MG/2ML nebulizer solution  Take 2 mLs (0.25 mg total) by nebulization 2 (two) times daily. Dx: 496  360 mL  3  . Calcium Carbonate-Vitamin D 600-400 MG-UNIT per tablet Take 1 tablet by mouth 2 (two) times daily.        . cetirizine (ZYRTEC) 10 MG tablet Take 10 mg by mouth daily.      . Coenzyme Q10 ([employer] COENZYME Q10) 100 MG capsule Take 100 mg by mouth daily.        Marland Kitchen dextromethorphan-guaiFENesin (MUCINEX DM) 30-600 MG per 12 hr tablet Take 1 tablet by mouth 2 (two) times daily as needed (for congestion).       . fexofenadine (ALLEGRA) 180 MG tablet Take 180 mg by mouth daily as needed (for allergies).       . Glucosamine-Chondroitin (GLUCOSAMINE CHONDR COMPLEX PO) Take 1 tablet by mouth 2 (two) times daily.       Marland Kitchen ipratropium (ATROVENT) 0.02 % nebulizer solution Take 2.5 mLs (500 mcg total) by nebulization 4 (four) times daily. Dx 496  900 mL  3  . mirtazapine (REMERON) 15 MG tablet Take 1 tablet (15 mg total) by mouth at bedtime.  90 tablet  1  . omeprazole-sodium bicarbonate (ZEGERID) 40-1100 MG per capsule Take 1 capsule by mouth 2 (two) times daily.  180 capsule  3  . predniSONE (DELTASONE) 10 MG tablet Take 5 mg by mouth. M,w,f       No current facility-administered medications on file prior to visit.    Allergies  Allergen Reactions  . Morphine     REACTION: nightmares    Review of Systems  Review of Systems  Constitutional: Negative for fever and malaise/fatigue.  HENT: Negative for congestion.   Eyes: Negative for discharge.  Respiratory: Negative for shortness of breath.   Cardiovascular: Negative for chest pain, palpitations and leg swelling.  Gastrointestinal: Negative for nausea, abdominal pain and diarrhea.  Genitourinary: Negative for dysuria.  Musculoskeletal: Negative for falls.  Skin: Negative for rash.  Neurological: Negative for loss of consciousness and headaches.  Endo/Heme/Allergies: Negative for polydipsia.  Psychiatric/Behavioral: Negative for depression and suicidal ideas. The  patient is not nervous/anxious and does not have insomnia.     Objective  BP 152/90  Pulse 127  Temp(Src) 97.7 F (36.5 C) (Oral)  Ht 5\' 1"  (1.549 m)  Wt  102 lb (46.267 kg)  BMI 19.28 kg/m2  SpO2 92%  Physical Exam  Physical Exam  Constitutional: She is oriented to person, place, and time and well-developed, well-nourished, and in no distress. No distress.  HENT:  Head: Normocephalic and atraumatic.  Eyes: Conjunctivae are normal.  Neck: Neck supple. No thyromegaly present.  Cardiovascular: Regular rhythm and normal heart sounds.   No murmur heard. tachycardia  Pulmonary/Chest: Effort normal and breath sounds normal. She has no wheezes.  Abdominal: She exhibits no distension and no mass.  Musculoskeletal: She exhibits no edema.  Lymphadenopathy:    She has no cervical adenopathy.  Neurological: She is alert and oriented to person, place, and time.  Skin: Skin is warm and dry. No rash noted. She is not diaphoretic.  Psychiatric: Memory, affect and judgment normal.    Lab Results  Component Value Date   TSH 2.665 11/30/2013   Lab Results  Component Value Date   WBC 13.7* 11/30/2013   HGB 12.3 11/30/2013   HCT 38.2 11/30/2013   MCV 86.6 11/30/2013   PLT 436* 11/30/2013   Lab Results  Component Value Date   CREATININE 0.50 11/30/2013   BUN 24* 11/30/2013   NA 144 11/30/2013   K 4.1 11/30/2013   CL 98 11/30/2013   CO2 34* 11/30/2013   Lab Results  Component Value Date   ALT 19 08/31/2013   AST 23 08/31/2013   ALKPHOS 55 08/31/2013   BILITOT 0.2* 08/31/2013   Lab Results  Component Value Date   CHOL 196 06/17/2013   Lab Results  Component Value Date   HDL 69 06/17/2013   Lab Results  Component Value Date   LDLCALC 79 06/17/2013   Lab Results  Component Value Date   TRIG 238* 06/17/2013   Lab Results  Component Value Date   CHOLHDL 2.8 06/17/2013     Assessment & Plan   GERD Avoid offending foods, start probiotics. Do not eat large meals in late evening and consider  raising head of bed.   HYPERTENSION, BENIGN ESSENTIAL Improved on recheck  HYPOTHYROIDISM Well treated.  COPD exacerbation Improved, on O2 24/7  Hyperglycemia hgba1c acceptable, minimize simple carbs. Increase exercise as tolerated.  Depression with anxiety Start Citalopram and may use Alprazolam prn  Insomnia Did not tolerate Ambien, may continue Remeron

## 2014-03-28 ENCOUNTER — Encounter: Payer: Self-pay | Admitting: Family Medicine

## 2014-03-28 DIAGNOSIS — G4700 Insomnia, unspecified: Secondary | ICD-10-CM | POA: Insufficient documentation

## 2014-03-28 NOTE — Assessment & Plan Note (Signed)
Start Citalopram and may use Alprazolam prn

## 2014-03-28 NOTE — Assessment & Plan Note (Signed)
Improved on recheck.  

## 2014-03-28 NOTE — Assessment & Plan Note (Signed)
Avoid offending foods, start probiotics. Do not eat large meals in late evening and consider raising head of bed.  

## 2014-03-28 NOTE — Assessment & Plan Note (Signed)
Improved, on O2 24/7

## 2014-03-28 NOTE — Assessment & Plan Note (Signed)
hgba1c acceptable, minimize simple carbs. Increase exercise as tolerated.  

## 2014-03-28 NOTE — Assessment & Plan Note (Signed)
Did not tolerate Ambien, may continue Remeron

## 2014-03-28 NOTE — Assessment & Plan Note (Signed)
Well treated 

## 2014-04-08 ENCOUNTER — Ambulatory Visit: Payer: Medicare FFS | Admitting: Family Medicine

## 2014-04-09 ENCOUNTER — Encounter: Payer: Self-pay | Admitting: Family Medicine

## 2014-04-09 ENCOUNTER — Ambulatory Visit (INDEPENDENT_AMBULATORY_CARE_PROVIDER_SITE_OTHER): Payer: Medicare FFS | Admitting: Family Medicine

## 2014-04-09 VITALS — BP 162/84 | HR 118 | Temp 97.4°F | Resp 18 | Ht 61.0 in | Wt 101.0 lb

## 2014-04-09 DIAGNOSIS — J9611 Chronic respiratory failure with hypoxia: Secondary | ICD-10-CM

## 2014-04-09 DIAGNOSIS — J9610 Chronic respiratory failure, unspecified whether with hypoxia or hypercapnia: Secondary | ICD-10-CM

## 2014-04-09 DIAGNOSIS — F329 Major depressive disorder, single episode, unspecified: Secondary | ICD-10-CM

## 2014-04-09 DIAGNOSIS — I10 Essential (primary) hypertension: Secondary | ICD-10-CM

## 2014-04-09 DIAGNOSIS — F341 Dysthymic disorder: Secondary | ICD-10-CM

## 2014-04-09 DIAGNOSIS — F418 Other specified anxiety disorders: Secondary | ICD-10-CM

## 2014-04-09 DIAGNOSIS — R0902 Hypoxemia: Secondary | ICD-10-CM

## 2014-04-09 DIAGNOSIS — F419 Anxiety disorder, unspecified: Principal | ICD-10-CM

## 2014-04-09 DIAGNOSIS — K219 Gastro-esophageal reflux disease without esophagitis: Secondary | ICD-10-CM

## 2014-04-09 MED ORDER — CITALOPRAM HYDROBROMIDE 20 MG PO TABS: 20.0000 mg | ORAL_TABLET | Freq: Every day | ORAL | Status: DC

## 2014-04-09 NOTE — Patient Instructions (Signed)

## 2014-04-16 ENCOUNTER — Telehealth (HOSPITAL_COMMUNITY): Payer: Self-pay

## 2014-04-16 NOTE — Telephone Encounter (Signed)
I have called and left a message with [facility] to inquire about participation in Pulmonary Rehab. Will send letter in mail and follow up.

## 2014-04-18 ENCOUNTER — Encounter: Payer: Self-pay | Admitting: Family Medicine

## 2014-04-18 NOTE — Assessment & Plan Note (Signed)
Avoid offending foods, start probiotics. Do not eat large meals in late evening and consider raising head of bed.  

## 2014-04-18 NOTE — Progress Notes (Signed)
Patient ID: BRIDGITT RAGGIO, female   DOB: 28-Nov-1940, 73 y.o.   MRN: [id number] GIANNY KILLMAN [id number] [birth year] 04/18/2014      Progress Note-Follow Up  Subjective  Chief Complaint  Chief Complaint  Patient presents with  . Follow-up    Celexa is doing well    HPI  Patient is a 73 year old female in today for routine medical care. She is in today for followup. She has had a good response to the Celexa. She's had no flare in her COPD since her last visit. Her breathing remains easily short but is not worsening. Denies CP/palp/HA/congestion/fevers/GI or GU c/o. Taking meds as prescribed  Past Medical History  Diagnosis Date  . COPD (chronic obstructive pulmonary disease)   . Thyroid disease   . Glucose intolerance (impaired glucose tolerance)   . History of small bowel obstruction   . Arthralgia   . Dyspnea   . Pallor   . Chest pain   . Osteoporosis   . Dysuria   . Weight loss   . Edema   . Weakness   . Anxiety   . Depression   . Migraine   . Hypertension   . Leukocytosis   . GERD (gastroesophageal reflux disease)   . Atrophic vaginitis 12/07/2012  . Hx of adenomatous colonic polyps   . Ischemic colitis   . Nocturia 09/02/2013  . Diarrhea 09/02/2013  . Hyperglycemia 12/01/2013    Past Surgical History  Procedure Laterality Date  . Cholecystectomy    . Abdominal hysterectomy  1993  . Tubal ligation    . Bladder repair    . Dilation and curettage of uterus      40 yrs ago  . Eye surgery  04/2011    bil. eyes  . Colonoscopy with propofol N/A 06/08/2013    Procedure: COLONOSCOPY WITH PROPOFOL;  Surgeon: Lafayette Dragon, MD;  Location: [facility];  Service: Endoscopy;  Laterality: N/A;    Family History  Problem Relation Age of Onset  . Alcohol abuse Mother   . Heart disease Mother   . Stroke Mother   . Hypertension Mother   . Kidney disease Mother   . COPD Mother   . Osteoporosis Mother     hip fracture  . COPD Brother   . Alcohol abuse Brother    drug abuse  . Hearing loss Brother     mvp  . Colon cancer Paternal Aunt   . Prostate cancer    . Alcohol abuse Father     MVA  . Thyroid disease Daughter   . COPD Paternal Grandfather   . COPD Sister   . Hypertension Sister   . Anxiety disorder Sister   . COPD Sister   . COPD Brother     current smoker  . Hearing loss Brother     cad  . COPD Brother     History   Social History  . Marital Status: Married    Spouse Name: N/A    Number of Children: 1  . Years of Education: N/A   Occupational History  . Retired Hydrologist    Social History Main Topics  . Smoking status: Former Smoker -- 2.00 packs/day for 40 years    Types: Cigarettes    Quit date: 10/29/2001  . Smokeless tobacco: Never Used  . Alcohol Use: No  . Drug Use: No  . Sexual Activity: Not on file   Other Topics Concern  . Not on file  Social History Narrative  . No narrative on file    Current Outpatient Prescriptions on File Prior to Visit  Medication Sig Dispense Refill  . albuterol (PROVENTIL HFA;VENTOLIN HFA) 108 (90 BASE) MCG/ACT inhaler Inhale 2 puffs into the lungs every 6 (six) hours as needed for wheezing or shortness of breath.       Marland Kitchen albuterol (PROVENTIL) (2.5 MG/3ML) 0.083% nebulizer solution Take 3 mLs (2.5 mg total) by nebulization every 4 (four) hours as needed for wheezing or shortness of breath. Dx 496  300 mL  3  . ALPRAZolam (XANAX) 0.5 MG tablet 1 tab po bid and 2 tabs po qhs prn anxiety, insomnia  120 tablet  1  . arformoterol (BROVANA) 15 MCG/2ML NEBU Take 2 mLs (15 mcg total) by nebulization 2 (two) times daily. Dx 496  360 mL  3  . benzonatate (TESSALON) 200 MG capsule Take 1 capsule (200 mg total) by mouth 3 (three) times daily as needed for cough.  30 capsule  5  . budesonide (PULMICORT) 0.25 MG/2ML nebulizer solution Take 2 mLs (0.25 mg total) by nebulization 2 (two) times daily. Dx: 496  360 mL  3  . Calcium Carbonate-Vitamin D 600-400 MG-UNIT per tablet  Take 1 tablet by mouth 2 (two) times daily.        . cetirizine (ZYRTEC) 10 MG tablet Take 10 mg by mouth daily.      . Coenzyme Q10 ([employer]S COENZYME Q10) 100 MG capsule Take 100 mg by mouth daily.        . fexofenadine (ALLEGRA) 180 MG tablet Take 180 mg by mouth daily as needed (for allergies).       . Glucosamine-Chondroitin (GLUCOSAMINE CHONDR COMPLEX PO) Take 1 tablet by mouth 2 (two) times daily.       Marland Kitchen ipratropium (ATROVENT) 0.02 % nebulizer solution Take 2.5 mLs (500 mcg total) by nebulization 4 (four) times daily. Dx 496  900 mL  3  . mirtazapine (REMERON) 15 MG tablet Take 1 tablet (15 mg total) by mouth at bedtime.  90 tablet  1  . omeprazole-sodium bicarbonate (ZEGERID) 40-1100 MG per capsule Take 1 capsule by mouth 2 (two) times daily.  180 capsule  3  . predniSONE (DELTASONE) 10 MG tablet Take 5 mg by mouth. M,w,f       No current facility-administered medications on file prior to visit.    Allergies  Allergen Reactions  . Ambien [Zolpidem]     anxiety  . Cymbalta [Duloxetine Hcl]     anxiety  . Morphine     REACTION: nightmares    Review of Systems  Review of Systems  Constitutional: Negative for fever and malaise/fatigue.  HENT: Negative for congestion.   Eyes: Negative for discharge.  Respiratory: Positive for shortness of breath.   Cardiovascular: Positive for leg swelling. Negative for chest pain and palpitations.  Gastrointestinal: Negative for nausea, abdominal pain and diarrhea.  Genitourinary: Negative for dysuria.  Musculoskeletal: Negative for falls.  Skin: Negative for rash.  Neurological: Negative for loss of consciousness and headaches.  Endo/Heme/Allergies: Negative for polydipsia.  Psychiatric/Behavioral: Negative for depression and suicidal ideas. The patient is not nervous/anxious and does not have insomnia.     Objective  BP 162/84  Pulse 118  Temp(Src) 97.4 F (36.3 C) (Oral)  Resp 18  Ht 5\' 1"  (1.549 m)  Wt 101 lb (45.813 kg)  BMI  19.09 kg/m2  SpO2 93%  Physical Exam  Physical Exam  Constitutional: She is oriented to person,  place, and time and well-developed, well-nourished, and in no distress. No distress.  HENT:  Head: Normocephalic and atraumatic.  Eyes: Conjunctivae are normal.  Neck: Neck supple. No thyromegaly present.  Cardiovascular: Normal rate, regular rhythm and normal heart sounds.   No murmur heard. Pulmonary/Chest: Effort normal and breath sounds normal. She has no wheezes.  O2 via [place], prolonged expiration  Abdominal: She exhibits no distension and no mass.  Musculoskeletal: She exhibits no edema.  Lymphadenopathy:    She has no cervical adenopathy.  Neurological: She is alert and oriented to person, place, and time.  Skin: Skin is warm and dry. No rash noted. She is not diaphoretic.  Psychiatric: Memory, affect and judgment normal.    Lab Results  Component Value Date   TSH 2.665 11/30/2013   Lab Results  Component Value Date   WBC 13.7* 11/30/2013   HGB 12.3 11/30/2013   HCT 38.2 11/30/2013   MCV 86.6 11/30/2013   PLT 436* 11/30/2013   Lab Results  Component Value Date   CREATININE 0.50 11/30/2013   BUN 24* 11/30/2013   NA 144 11/30/2013   K 4.1 11/30/2013   CL 98 11/30/2013   CO2 34* 11/30/2013   Lab Results  Component Value Date   ALT 19 08/31/2013   AST 23 08/31/2013   ALKPHOS 55 08/31/2013   BILITOT 0.2* 08/31/2013   Lab Results  Component Value Date   CHOL 196 06/17/2013   Lab Results  Component Value Date   HDL 69 06/17/2013   Lab Results  Component Value Date   LDLCALC 79 06/17/2013   Lab Results  Component Value Date   TRIG 238* 06/17/2013   Lab Results  Component Value Date   CHOLHDL 2.8 06/17/2013     Assessment & Plan  GERD Avoid offending foods, start probiotics. Do not eat large meals in late evening and consider raising head of bed.   HYPERTENSION, BENIGN ESSENTIAL Improved on recheck will continue to monitor  Depression with anxiety Continue on Celexa and may  continue Alprazolam prn. Is doing much better  Chronic respiratory failure On O2 via [place]. Is following with pulmonology.

## 2014-04-18 NOTE — Assessment & Plan Note (Signed)
On O2 via [place]. Is following with pulmonology.

## 2014-04-18 NOTE — Assessment & Plan Note (Signed)
Improved on recheck will continue to monitor

## 2014-04-18 NOTE — Assessment & Plan Note (Addendum)
Continue on Celexa and may continue Alprazolam prn. Is doing much better

## 2014-04-22 ENCOUNTER — Ambulatory Visit (INDEPENDENT_AMBULATORY_CARE_PROVIDER_SITE_OTHER): Payer: Medicare FFS | Admitting: Pulmonary Disease

## 2014-04-22 ENCOUNTER — Encounter: Payer: Self-pay | Admitting: Pulmonary Disease

## 2014-04-22 VITALS — BP 132/70 | HR 99 | Ht 61.0 in | Wt 100.0 lb

## 2014-04-22 DIAGNOSIS — J441 Chronic obstructive pulmonary disease with (acute) exacerbation: Secondary | ICD-10-CM

## 2014-04-22 DIAGNOSIS — J4489 Other specified chronic obstructive pulmonary disease: Secondary | ICD-10-CM

## 2014-04-22 DIAGNOSIS — J9610 Chronic respiratory failure, unspecified whether with hypoxia or hypercapnia: Secondary | ICD-10-CM

## 2014-04-22 DIAGNOSIS — J449 Chronic obstructive pulmonary disease, unspecified: Secondary | ICD-10-CM

## 2014-04-22 DIAGNOSIS — J9612 Chronic respiratory failure with hypercapnia: Secondary | ICD-10-CM

## 2014-04-22 MED ORDER — BUDESONIDE 0.25 MG/2ML IN SUSP: 0.2500 mg | Freq: Two times a day (BID) | RESPIRATORY_TRACT | Status: DC

## 2014-04-22 MED ORDER — ARFORMOTEROL TARTRATE 15 MCG/2ML IN NEBU: 15.0000 ug | INHALATION_SOLUTION | Freq: Two times a day (BID) | RESPIRATORY_TRACT | Status: DC

## 2014-04-22 MED ORDER — ALBUTEROL SULFATE (2.5 MG/3ML) 0.083% IN NEBU: 2.5000 mg | INHALATION_SOLUTION | RESPIRATORY_TRACT | Status: DC | PRN

## 2014-04-22 MED ORDER — PREDNISONE 10 MG PO TABS: 5.0000 mg | ORAL_TABLET | Freq: Every day | ORAL | Status: DC

## 2014-04-22 NOTE — Progress Notes (Signed)
   Subjective:    Patient ID: [name], female    DOB: [birth year], 73 y.o.   MRN: [id number]  HPI  7 yowf , heavy ex- smoker, quit 2002 with GOLD D COPD FEV1 26% 6/10 on 24 h O2 since 2009.  -completed pulm rehab oct '11  Recurrent flares 2-3/yr requiring steroid bursts  Admitted 12/2012 for COPD exacerbation .ABG 7.39/55/75 on 3L [place]  She required low dose prednisone since her admit in 02/2013  She uses 3 liters 24/7. If she is really SOB at times she will bump up to 4L for a few minutes  On a regimen of budesonide/brovana & alb/atrovent nebs  Had colonoscopy with polyp removal 06/2013    04/22/2014  Chief Complaint  Patient presents with  . Follow-up    Pt c/o stable SOB with exertion.  Denies cough, congestion.    Uses prednisone alternate day  ONO on 2L >> no sig desatn Headaches have improved after starting Celexa  She continues to have episodes of sudden onset dyspnea , which is relieved by increasing the oxygen . Husband feels that these are not anxiety episodes , since they also occur with exertion .she is worried that her oxygen cord developed kinks and she's not getting enough oxygen .     Review of Systems neg for any significant sore throat, dysphagia, itching, sneezing, nasal congestion or excess/ purulent secretions, fever, chills, sweats, unintended wt loss, pleuritic or exertional cp, hempoptysis, orthopnea pnd or change in chronic leg swelling. Also denies presyncope, palpitations, heartburn, abdominal pain, nausea, vomiting, diarrhea or change in bowel or urinary habits, dysuria,hematuria, rash, arthralgias, visual complaints, headache, numbness weakness or ataxia.     Objective:   Physical Exam  Gen. Pleasant, well-nourished, in no distress ENT - no lesions, no post nasal drip Neck: No JVD, no thyromegaly, no carotid bruits Lungs: no use of accessory muscles, no dullness to percussion, clear without rales or rhonchi  Cardiovascular: Rhythm regular,  heart sounds  normal, no murmurs or gallops, no peripheral edema Musculoskeletal: No deformities, no cyanosis or clubbing        Assessment & Plan:

## 2014-04-22 NOTE — Patient Instructions (Signed)
Refills given x 3 months x 3  Take prednisone 5 mg daily  Try to decrease oxygen to 2 L during sleep -may help with headaches

## 2014-04-22 NOTE — Assessment & Plan Note (Signed)
Refills given x 3 months x 3  Increase prednisone 5 mg daily

## 2014-04-22 NOTE — Assessment & Plan Note (Signed)
Try to decrease oxygen to 2 L during sleep -may help with headaches PB have to consider anxiety as the cause of her episodic dyspnea

## 2014-04-23 ENCOUNTER — Telehealth (HOSPITAL_COMMUNITY): Payer: Self-pay

## 2014-06-08 ENCOUNTER — Ambulatory Visit: Payer: Medicare FFS | Admitting: Family

## 2014-06-08 ENCOUNTER — Telehealth: Payer: Self-pay | Admitting: Family

## 2014-06-08 DIAGNOSIS — Z0289 Encounter for other administrative examinations: Secondary | ICD-10-CM

## 2014-06-08 NOTE — Telephone Encounter (Signed)
Pt states her COPD is bothering her to bad and will be unable to attend appt

## 2014-06-08 NOTE — Telephone Encounter (Signed)
Spoke with [name]. She states her COPD is always severe. I advised [name] that if her breathing is more difficult than usual that she should proceed to the ER. [name] states she will call Dr Bari Mantis office and if breathing worsens she will proceed to the ER.

## 2014-06-08 NOTE — Telephone Encounter (Signed)
Please advise pt that if her COPD symptoms are severe she should proceed to the ER.

## 2014-08-04 ENCOUNTER — Other Ambulatory Visit (INDEPENDENT_AMBULATORY_CARE_PROVIDER_SITE_OTHER)

## 2014-08-04 DIAGNOSIS — I10 Essential (primary) hypertension: Secondary | ICD-10-CM

## 2014-08-04 DIAGNOSIS — E039 Hypothyroidism, unspecified: Secondary | ICD-10-CM

## 2014-08-04 DIAGNOSIS — K219 Gastro-esophageal reflux disease without esophagitis: Secondary | ICD-10-CM

## 2014-08-04 LAB — CBC WITH DIFFERENTIAL/PLATELET
Basophils Absolute: 0.1 10*3/uL (ref 0.0–0.1)
Basophils Relative: 0.9 % (ref 0.0–3.0)
EOS ABS: 0.3 10*3/uL (ref 0.0–0.7)
Eosinophils Relative: 3 % (ref 0.0–5.0)
HEMATOCRIT: 34.8 % — AB (ref 36.0–46.0)
Hemoglobin: 10.8 g/dL — ABNORMAL LOW (ref 12.0–15.0)
Lymphocytes Relative: 28.6 % (ref 12.0–46.0)
Lymphs Abs: 3.1 10*3/uL (ref 0.7–4.0)
MCHC: 31 g/dL (ref 30.0–36.0)
MCV: 88 fl (ref 78.0–100.0)
MONO ABS: 1 10*3/uL (ref 0.1–1.0)
Monocytes Relative: 9.7 % (ref 3.0–12.0)
NEUTROS PCT: 57.8 % (ref 43.0–77.0)
Neutro Abs: 6.2 10*3/uL (ref 1.4–7.7)
PLATELETS: 397 10*3/uL (ref 150.0–400.0)
RBC: 3.96 Mil/uL (ref 3.87–5.11)
RDW: 16.8 % — AB (ref 11.5–15.5)
WBC: 10.7 10*3/uL — ABNORMAL HIGH (ref 4.0–10.5)

## 2014-08-04 LAB — LIPID PANEL
CHOLESTEROL: 192 mg/dL (ref 0–200)
HDL: 75.1 mg/dL (ref >39.00)
NonHDL: 116.9
TRIGLYCERIDES: 219 mg/dL — AB (ref 0.0–149.0)
Total CHOL/HDL Ratio: 3
VLDL: 43.8 mg/dL — AB (ref 0.0–40.0)

## 2014-08-04 LAB — RENAL FUNCTION PANEL
ALBUMIN: 3.6 g/dL (ref 3.5–5.2)
BUN: 24 mg/dL — AB (ref 6–23)
CALCIUM: 9.3 mg/dL (ref 8.4–10.5)
CO2: 33 mEq/L — ABNORMAL HIGH (ref 19–32)
CREATININE: 0.5 mg/dL (ref 0.4–1.2)
Chloride: 98 mEq/L (ref 96–112)
GFR: 138.42 mL/min (ref >60.00)
GLUCOSE: 54 mg/dL — AB (ref 70–99)
Phosphorus: 3.6 mg/dL (ref 2.3–4.6)
Potassium: 3.5 mEq/L (ref 3.5–5.1)
Sodium: 141 mEq/L (ref 135–145)

## 2014-08-04 LAB — HEPATIC FUNCTION PANEL
ALT: 13 U/L (ref 0–35)
AST: 25 U/L (ref 0–37)
Albumin: 3.6 g/dL (ref 3.5–5.2)
Alkaline Phosphatase: 61 U/L (ref 39–117)
BILIRUBIN TOTAL: 0.3 mg/dL (ref 0.2–1.2)
Bilirubin, Direct: 0.1 mg/dL (ref 0.0–0.3)
Total Protein: 7.2 g/dL (ref 6.0–8.3)

## 2014-08-04 LAB — TSH: TSH: 5.28 u[IU]/mL — AB (ref 0.35–4.50)

## 2014-08-04 LAB — HEMOGLOBIN A1C: HEMOGLOBIN A1C: 5.6 % (ref 4.6–6.5)

## 2014-08-04 LAB — LDL CHOLESTEROL, DIRECT: Direct LDL: 71.8 mg/dL

## 2014-08-05 ENCOUNTER — Encounter: Payer: Self-pay | Admitting: Pulmonary Disease

## 2014-08-05 ENCOUNTER — Ambulatory Visit (INDEPENDENT_AMBULATORY_CARE_PROVIDER_SITE_OTHER): Payer: Medicare FFS | Admitting: Pulmonary Disease

## 2014-08-05 VITALS — BP 113/78 | HR 101 | Temp 97.6°F | Ht 61.0 in | Wt 99.0 lb

## 2014-08-05 DIAGNOSIS — J9612 Chronic respiratory failure with hypercapnia: Secondary | ICD-10-CM | POA: Diagnosis not present

## 2014-08-05 DIAGNOSIS — J441 Chronic obstructive pulmonary disease with (acute) exacerbation: Secondary | ICD-10-CM | POA: Diagnosis not present

## 2014-08-05 DIAGNOSIS — Z23 Encounter for immunization: Secondary | ICD-10-CM

## 2014-08-05 DIAGNOSIS — Z515 Encounter for palliative care: Secondary | ICD-10-CM | POA: Diagnosis not present

## 2014-08-05 MED ORDER — ALBUTEROL SULFATE HFA 108 (90 BASE) MCG/ACT IN AERS: 2.0000 | INHALATION_SPRAY | Freq: Four times a day (QID) | RESPIRATORY_TRACT | Status: DC | PRN

## 2014-08-05 NOTE — Assessment & Plan Note (Signed)
Appears severe but stable at this time Would continue 5 mg of prednisone, since this has decreased exacerbations

## 2014-08-05 NOTE — Assessment & Plan Note (Signed)
Not sure if headaches are related to chronic hypercarbia. She may be a candidate for noct bipap in the future

## 2014-08-05 NOTE — Progress Notes (Signed)
   Subjective:    Patient ID: [name], female    DOB: 12-26-1940, 73 y.o.   MRN: [id number]  HPI  89 yowf , heavy ex- smoker, quit 2002 with GOLD D COPD FEV1 26% 6/10 on 24 h O2 since 2009.  -completed pulm rehab oct '11  Recurrent flares 2-3/yr requiring steroid bursts  She uses 3 liters 24/7. If she is really SOB at times she will bump up to 4L for a few minutes  On a regimen of budesonide/brovana & alb/atrovent nebs   Significant tests/ events  Admitted 12/2012 for COPD exacerbation .ABG 7.39/55/75 on 3L [place]  She required low dose prednisone since her admit in 02/2013   Had colonoscopy with polyp removal 06/2013  08/2013 BL pneumonia  03/2014 ONO on 2L >> no sig desatn   08/05/2014  Chief Complaint  Patient presents with  . COPD    follow-up. Pt states that she stillhas terrible headaches even after decreasing oxygen to 2 liters at night. She states she feels like her SOB is progressively getting worse since last visit.     Uses prednisone  daily Headaches persist, using GC powder alternate day, on Celexa -anxiety better with xanax 2-3 flares this year, but only one over last 4 mnths requiring UC visit   Review of Systems neg for any significant sore throat, dysphagia, itching, sneezing, nasal congestion or excess/ purulent secretions, fever, chills, sweats, unintended wt loss, pleuritic or exertional cp, hempoptysis, orthopnea pnd or change in chronic leg swelling. Also denies presyncope, palpitations, heartburn, abdominal pain, nausea, vomiting, diarrhea or change in bowel or urinary habits, dysuria,hematuria, rash, arthralgias, visual complaints, headache, numbness weakness or ataxia.     Objective:   Physical Exam  Gen. Pleasant, thin, in no distress, normal affect ENT - no lesions, no post nasal drip Neck: No JVD, no thyromegaly, no carotid bruits Lungs: no use of accessory muscles, no dullness to percussion, decreased without rales or rhonchi  Cardiovascular:  Rhythm regular, heart sounds  normal, no murmurs or gallops, no peripheral edema Abdomen: soft and non-tender, no hepatosplenomegaly, BS normal. Musculoskeletal: No deformities, no cyanosis or clubbing Neuro:  alert, non focal       Assessment & Plan:

## 2014-08-05 NOTE — Patient Instructions (Signed)
Refills Continue 5 mg prednisone daily Calcium/ vit D tabs daily Flu shot today

## 2014-08-05 NOTE — Assessment & Plan Note (Signed)
We discussed quality of life issues, should she develop terminal illness. She would not want mechanical ventilation, if her condition was not reversible. She has discussed this with her family

## 2014-08-10 ENCOUNTER — Ambulatory Visit (INDEPENDENT_AMBULATORY_CARE_PROVIDER_SITE_OTHER): Payer: Medicare PPO | Admitting: Family Medicine

## 2014-08-10 ENCOUNTER — Encounter: Payer: Self-pay | Admitting: Family Medicine

## 2014-08-10 VITALS — BP 125/52 | HR 84 | Temp 97.4°F | Ht 61.0 in | Wt 100.4 lb

## 2014-08-10 DIAGNOSIS — R946 Abnormal results of thyroid function studies: Secondary | ICD-10-CM

## 2014-08-10 DIAGNOSIS — F419 Anxiety disorder, unspecified: Secondary | ICD-10-CM

## 2014-08-10 DIAGNOSIS — F32A Depression, unspecified: Secondary | ICD-10-CM

## 2014-08-10 DIAGNOSIS — I10 Essential (primary) hypertension: Secondary | ICD-10-CM

## 2014-08-10 DIAGNOSIS — F418 Other specified anxiety disorders: Secondary | ICD-10-CM

## 2014-08-10 DIAGNOSIS — F329 Major depressive disorder, single episode, unspecified: Secondary | ICD-10-CM

## 2014-08-10 DIAGNOSIS — G4700 Insomnia, unspecified: Secondary | ICD-10-CM

## 2014-08-10 DIAGNOSIS — Z23 Encounter for immunization: Secondary | ICD-10-CM

## 2014-08-10 DIAGNOSIS — E039 Hypothyroidism, unspecified: Secondary | ICD-10-CM

## 2014-08-10 DIAGNOSIS — K219 Gastro-esophageal reflux disease without esophagitis: Secondary | ICD-10-CM

## 2014-08-10 DIAGNOSIS — J9612 Chronic respiratory failure with hypercapnia: Secondary | ICD-10-CM

## 2014-08-10 DIAGNOSIS — D649 Anemia, unspecified: Secondary | ICD-10-CM

## 2014-08-10 LAB — CBC
HCT: 30.8 % — ABNORMAL LOW (ref 36.0–46.0)
Hemoglobin: 9.5 g/dL — ABNORMAL LOW (ref 12.0–15.0)
MCHC: 30.9 g/dL (ref 30.0–36.0)
MCV: 87.6 fl (ref 78.0–100.0)
PLATELETS: 324 10*3/uL (ref 150.0–400.0)
RBC: 3.51 Mil/uL — AB (ref 3.87–5.11)
RDW: 16.6 % — ABNORMAL HIGH (ref 11.5–15.5)
WBC: 9.6 10*3/uL (ref 4.0–10.5)

## 2014-08-10 LAB — T4, FREE: FREE T4: 2.29 ng/dL — AB (ref 0.60–1.60)

## 2014-08-10 MED ORDER — CITALOPRAM HYDROBROMIDE 20 MG PO TABS: 20.0000 mg | ORAL_TABLET | Freq: Every day | ORAL | Status: DC

## 2014-08-10 MED ORDER — OMEPRAZOLE-SODIUM BICARBONATE 40-1100 MG PO CAPS: 1.0000 | ORAL_CAPSULE | Freq: Two times a day (BID) | ORAL | Status: DC

## 2014-08-10 MED ORDER — ALPRAZOLAM 0.5 MG PO TABS: ORAL_TABLET | ORAL | Status: DC

## 2014-08-10 MED ORDER — MIRTAZAPINE 15 MG PO TABS: 15.0000 mg | ORAL_TABLET | Freq: Every day | ORAL | Status: DC

## 2014-08-10 MED ORDER — FERROUS FUMARATE-FOLIC ACID 324-1 MG PO TABS: 1.0000 | ORAL_TABLET | Freq: Every day | ORAL | Status: DC

## 2014-08-10 NOTE — Progress Notes (Signed)
Pre visit review using our clinic review tool, if applicable. No additional management support is needed unless otherwise documented below in the visit note. 

## 2014-08-12 ENCOUNTER — Telehealth: Payer: Self-pay

## 2014-08-12 DIAGNOSIS — E039 Hypothyroidism, unspecified: Secondary | ICD-10-CM

## 2014-08-12 DIAGNOSIS — D649 Anemia, unspecified: Secondary | ICD-10-CM

## 2014-08-12 NOTE — Telephone Encounter (Signed)
Message copied by Varney Daily on Thu Aug 12, 2014  8:43 AM ------      Message from: Penni Homans A      Created: Tue Aug 10, 2014 10:23 PM       Anemia slightly worse please send her in Hemocyte F 1 tab po daily disp #30 with 2 rf. tsh and T4 or both up one suggests hypothyroid and one suggests hyperthyroid so we need to recheck labs in 3 weeks, cbc tsh, free T4 ------

## 2014-08-15 ENCOUNTER — Encounter: Payer: Self-pay | Admitting: Family Medicine

## 2014-08-15 NOTE — Assessment & Plan Note (Signed)
Stable on current O2 requirements

## 2014-08-15 NOTE — Assessment & Plan Note (Signed)
Increase leafy greens, consider increased lean red meat and using cast iron cookware. Continue to monitor, report any concerns 

## 2014-08-15 NOTE — Assessment & Plan Note (Signed)
Well controlled, no changes to meds. Encouraged heart healthy diet such as the DASH diet and exercise as tolerated.  °

## 2014-08-15 NOTE — Assessment & Plan Note (Signed)
Will add Hemocyte F and monitor, repeat CBC in 1 months

## 2014-08-15 NOTE — Progress Notes (Signed)
Patient ID: [name], female   DOB: 1941-07-24, 73 y.o.   MRN: [id number] [name] [id number] 1941/02/05 08/15/2014      Progress Note-Follow Up  Subjective  Chief Complaint  Chief Complaint  Patient presents with  . Follow-up    2 month  . Injections    prevnar    HPI  Patient is a 73 year old female in today for routine medical care. Patient sent in for followup. Struggling with worsening fatigue but otherwise denies recent illness. No recent injury. No change in bowel habits. Agrees to [employer] shot today. Is using O2 at 3 L routinely. Denies CP/palp/SOB/HA/congestion/fevers/GI or GU c/o. Taking meds as prescribed   Past Medical History  Diagnosis Date  . COPD (chronic obstructive pulmonary disease)   . Thyroid disease   . Glucose intolerance (impaired glucose tolerance)   . History of small bowel obstruction   . Arthralgia   . Dyspnea   . Pallor   . Chest pain   . Osteoporosis   . Dysuria   . Weight loss   . Edema   . Weakness   . Anxiety   . Depression   . Migraine   . Hypertension   . Leukocytosis   . GERD (gastroesophageal reflux disease)   . Atrophic vaginitis 12/07/2012  . Hx of adenomatous colonic polyps   . Ischemic colitis   . Nocturia 09/02/2013  . Diarrhea 09/02/2013  . Hyperglycemia 12/01/2013    Past Surgical History  Procedure Laterality Date  . Cholecystectomy    . Abdominal hysterectomy  1993  . Tubal ligation    . Bladder repair    . Dilation and curettage of uterus      40 yrs ago  . Eye surgery  04/2011    bil. eyes  . Colonoscopy with propofol N/A 06/08/2013    Procedure: COLONOSCOPY WITH PROPOFOL;  Surgeon: Lafayette Dragon, MD;  Location: [facility];  Service: Endoscopy;  Laterality: N/A;    Family History  Problem Relation Age of Onset  . Alcohol abuse Mother   . Heart disease Mother   . Stroke Mother   . Hypertension Mother   . Kidney disease Mother   . COPD Mother   . Osteoporosis Mother     hip fracture  .  COPD Brother   . Alcohol abuse Brother     drug abuse  . Hearing loss Brother     mvp  . Colon cancer Paternal Aunt   . Prostate cancer    . Alcohol abuse Father     MVA  . Thyroid disease Daughter   . COPD Paternal Grandfather   . COPD Sister   . Hypertension Sister   . Anxiety disorder Sister   . COPD Sister   . COPD Brother     current smoker  . Hearing loss Brother     cad  . COPD Brother     History   Social History  . Marital Status: Married    Spouse Name: N/A    Number of Children: 1  . Years of Education: N/A   Occupational History  . Retired Hydrologist    Social History Main Topics  . Smoking status: Former Smoker -- 2.00 packs/day for 40 years    Types: Cigarettes    Quit date: 10/29/2001  . Smokeless tobacco: Never Used  . Alcohol Use: No  . Drug Use: No  . Sexual Activity: Not on file   Other Topics  Concern  . Not on file   Social History Narrative  . No narrative on file    Current Outpatient Prescriptions on File Prior to Visit  Medication Sig Dispense Refill  . albuterol (PROVENTIL HFA;VENTOLIN HFA) 108 (90 BASE) MCG/ACT inhaler Inhale 2 puffs into the lungs every 6 (six) hours as needed for wheezing or shortness of breath.  3 Inhaler  3  . albuterol (PROVENTIL) (2.5 MG/3ML) 0.083% nebulizer solution Take 3 mLs (2.5 mg total) by nebulization every 4 (four) hours as needed for wheezing or shortness of breath. Dx 496  300 mL  3  . arformoterol (BROVANA) 15 MCG/2ML NEBU Take 2 mLs (15 mcg total) by nebulization 2 (two) times daily. Dx 496  360 mL  3  . benzonatate (TESSALON) 200 MG capsule Take 1 capsule (200 mg total) by mouth 3 (three) times daily as needed for cough.  30 capsule  5  . budesonide (PULMICORT) 0.25 MG/2ML nebulizer solution Take 2 mLs (0.25 mg total) by nebulization 2 (two) times daily. Dx: 496  360 mL  3  . Calcium Carbonate-Vitamin D 600-400 MG-UNIT per tablet Take 1 tablet by mouth 2 (two) times daily.         . cetirizine (ZYRTEC) 10 MG tablet Take 10 mg by mouth daily.      . Coenzyme Q10 ([employer] COENZYME Q10) 100 MG capsule Take 100 mg by mouth daily.        . fexofenadine (ALLEGRA) 180 MG tablet Take 180 mg by mouth daily as needed (for allergies).       . Glucosamine-Chondroitin (GLUCOSAMINE CHONDR COMPLEX PO) Take 1 tablet by mouth 2 (two) times daily.       Marland Kitchen ipratropium (ATROVENT) 0.02 % nebulizer solution Take 500 mcg by nebulization every 6 (six) hours as needed. Dx 496      . predniSONE (DELTASONE) 10 MG tablet Take 0.5 tablets (5 mg total) by mouth daily.  90 tablet  3   No current facility-administered medications on file prior to visit.    Allergies  Allergen Reactions  . Ambien [Zolpidem]     anxiety  . Cymbalta [Duloxetine Hcl]     anxiety  . Morphine     REACTION: nightmares    Review of Systems  Review of Systems  Constitutional: Positive for malaise/fatigue. Negative for fever.  HENT: Negative for congestion.   Eyes: Negative for discharge.  Respiratory: Positive for sputum production. Negative for shortness of breath.   Cardiovascular: Negative for chest pain, palpitations and leg swelling.  Gastrointestinal: Negative for nausea, abdominal pain and diarrhea.  Genitourinary: Negative for dysuria.  Musculoskeletal: Negative for falls.  Skin: Negative for rash.  Neurological: Negative for loss of consciousness and headaches.  Endo/Heme/Allergies: Negative for polydipsia.  Psychiatric/Behavioral: Negative for depression and suicidal ideas. The patient is not nervous/anxious and does not have insomnia.     Objective  BP 125/52  Pulse 84  Temp(Src) 97.4 F (36.3 C) (Oral)  Ht 5\' 1"  (1.549 m)  Wt 100 lb 6.4 oz (45.541 kg)  BMI 18.98 kg/m2  SpO2 97%  Physical Exam  Physical Exam  Constitutional: She is oriented to person, place, and time and well-developed, well-nourished, and in no distress. No distress.  HENT:  Head: Normocephalic and atraumatic.  Eyes:  Conjunctivae are normal.  Neck: Neck supple. No thyromegaly present.  Cardiovascular: Normal rate, regular rhythm and normal heart sounds.   No murmur heard. Pulmonary/Chest: Effort normal and breath sounds normal. She has no wheezes.  O2 via [place]  Abdominal: She exhibits no distension and no mass.  Musculoskeletal: She exhibits no edema.  Lymphadenopathy:    She has no cervical adenopathy.  Neurological: She is alert and oriented to person, place, and time.  Skin: Skin is warm and dry. No rash noted. She is not diaphoretic.  Psychiatric: Memory, affect and judgment normal.    Lab Results  Component Value Date   TSH 5.28* 08/04/2014   Lab Results  Component Value Date   WBC 9.6 08/10/2014   HGB 9.5* 08/10/2014   HCT 30.8* 08/10/2014   MCV 87.6 08/10/2014   PLT 324.0 08/10/2014   Lab Results  Component Value Date   CREATININE 0.5 08/04/2014   BUN 24* 08/04/2014   NA 141 08/04/2014   K 3.5 08/04/2014   CL 98 08/04/2014   CO2 33* 08/04/2014   Lab Results  Component Value Date   ALT 13 08/04/2014   AST 25 08/04/2014   ALKPHOS 61 08/04/2014   BILITOT 0.3 08/04/2014   Lab Results  Component Value Date   CHOL 192 08/04/2014   Lab Results  Component Value Date   HDL 75.10 08/04/2014   Lab Results  Component Value Date   LDLCALC 79 06/17/2013   Lab Results  Component Value Date   TRIG 219.0* 08/04/2014   Lab Results  Component Value Date   CHOLHDL 3 08/04/2014     Assessment & Plan  HYPERTENSION, BENIGN ESSENTIAL Well controlled, no changes to meds. Encouraged heart healthy diet such as the DASH diet and exercise as tolerated.   Anemia Increase leafy greens, consider increased lean red meat and using cast iron cookware. Continue to monitor, report any concerns  GERD Avoid offending foods, start probiotics. Do not eat large meals in late evening and consider raising head of bed.   Hypothyroidism TSH and free T4 both elevated. Will recheck in 1 month.  asymptomatic  Normocytic anemia Will add Hemocyte F and monitor, repeat CBC in 1 months  Chronic respiratory failure Stable on current O2 requirements

## 2014-08-15 NOTE — Assessment & Plan Note (Signed)
Avoid offending foods, start probiotics. Do not eat large meals in late evening and consider raising head of bed.  

## 2014-08-15 NOTE — Assessment & Plan Note (Signed)
TSH and free T4 both elevated. Will recheck in 1 month. asymptomatic

## 2014-08-16 ENCOUNTER — Ambulatory Visit (INDEPENDENT_AMBULATORY_CARE_PROVIDER_SITE_OTHER): Payer: Medicare FFS | Admitting: Adult Health

## 2014-08-16 ENCOUNTER — Encounter: Payer: Self-pay | Admitting: Adult Health

## 2014-08-16 ENCOUNTER — Ambulatory Visit (INDEPENDENT_AMBULATORY_CARE_PROVIDER_SITE_OTHER)
Admission: RE | Admit: 2014-08-16 | Discharge: 2014-08-16 | Disposition: A | Discharge status: Home / Self Care | Payer: Medicare FFS | Source: Ambulatory Visit | Attending: Adult Health | Admitting: Adult Health

## 2014-08-16 VITALS — BP 115/64 | HR 100 | Temp 97.1°F | Ht 61.0 in | Wt 98.4 lb

## 2014-08-16 DIAGNOSIS — J441 Chronic obstructive pulmonary disease with (acute) exacerbation: Secondary | ICD-10-CM | POA: Diagnosis not present

## 2014-08-16 MED ORDER — AMOXICILLIN-POT CLAVULANATE 875-125 MG PO TABS: 1.0000 | ORAL_TABLET | Freq: Two times a day (BID) | ORAL | Status: AC

## 2014-08-16 MED ORDER — FERROUS FUMARATE-FOLIC ACID 324-1 MG PO TABS: 1.0000 | ORAL_TABLET | Freq: Every day | ORAL | Status: DC

## 2014-08-16 NOTE — Telephone Encounter (Signed)
Pt informed. Labs ordered and rx sent to pharmacy

## 2014-08-16 NOTE — Patient Instructions (Signed)
Augmentin 875mg  Twice daily  For 7 days -take with food  Mucinex DM Twice daily  As needed  Cough/congestion  Fluids and rest  Increase Prednisone 20mg  daily for 1 week then 10mg  daily 1 week then back 5mg  daily  Chest xray today  Follow up Dr. Elsworth Soho as planned and As needed   Please contact office for sooner follow up if symptoms do not improve or worsen or seek emergency care

## 2014-08-17 NOTE — Assessment & Plan Note (Addendum)
Exacerbation  Check cxr   Plan  Augmentin 875mg  Twice daily  For 7 days -take with food  Mucinex DM Twice daily  As needed  Cough/congestion  Fluids and rest  Increase Prednisone 20mg  daily for 1 week then 10mg  daily 1 week then back 5mg  daily  Chest xray today  Follow up Dr. Elsworth Soho as planned and As needed   Please contact office for sooner follow up if symptoms do not improve or worsen or seek emergency care

## 2014-08-17 NOTE — Progress Notes (Signed)
   Subjective:    Patient ID: [name], female    DOB: 26-May-1941, 73 y.o.   MRN: [id number]  HPI   73 yowf , heavy ex- smoker, quit 2002 with GOLD D COPD FEV1 26% 6/10 on 24 h O2 since 2009.  -completed pulm rehab oct '11  Recurrent flares 2-3/yr requiring steroid bursts  She uses 3 liters 24/7. If she is really SOB at times she will bump up to 4L for a few minutes  On a regimen of budesonide/brovana & alb/atrovent nebs   Significant tests/ events  Admitted 12/2012 for COPD exacerbation .ABG 7.39/55/75 on 3L [place]  She required low dose prednisone since her admit in 02/2013   Had colonoscopy with polyp removal 06/2013  08/2013 BL pneumonia  03/2014 ONO on 2L >> no sig desatn   08/05/14  Uses prednisone  daily Headaches persist, using GC powder alternate day, on Celexa -anxiety better with xanax 2-3 flares this year, but only one over last 4 mnths requiring UC visit  08/16/14 Acute OV  (Hx of heavy ex- smoker, quit 2002 with GOLD D COPD FEV1 26% 03/2009 on 24 h O2 since 2009. ) Complains of chest congestion; cough; SOB; coughing up dark yellow/ green mucus for last 4 days.  Mucinex not helping.  Currently on prednisone 5mg  daily  Denies chest pain, orthopnea, edema or hemoptysis, or n/v/d.    Review of Systems  neg for any significant sore throat, dysphagia, itching, sneezing, , sweats, unintended wt loss, pleuritic or exertional cp, hempoptysis, orthopnea pnd or change in chronic leg swelling. Also denies presyncope, palpitations, heartburn, abdominal pain, nausea, vomiting, diarrhea or change in bowel or urinary habits, dysuria,hematuria, rash, arthralgias, visual complaints, headache, numbness weakness or ataxia.     Objective:   Physical Exam   Gen. Pleasant, thin, in no distress, normal affect ENT - no lesions, no post nasal drip Neck: No JVD, no thyromegaly, no carotid bruits Lungs: no use of accessory muscles, no dullness to percussion,  Few tr exp wheezing    Cardiovascular: Rhythm regular, heart sounds  normal, no murmurs or gallops, no peripheral edema Abdomen: soft and non-tender, no hepatosplenomegaly, BS normal. Musculoskeletal: No deformities, no cyanosis or clubbing Neuro:  alert, non focal       Assessment & Plan:

## 2014-08-18 NOTE — Progress Notes (Signed)
Quick Note:  lmtcb ______ 

## 2014-08-19 NOTE — Progress Notes (Signed)
Quick Note:  Called spoke with patient, advised of cxr results / recs as stated by TP. Pt verbalized her understanding and denied any questions. ______ 

## 2014-09-03 ENCOUNTER — Encounter: Payer: Self-pay | Admitting: Family Medicine

## 2014-09-22 ENCOUNTER — Other Ambulatory Visit: Payer: Self-pay | Admitting: Family Medicine

## 2014-09-22 ENCOUNTER — Other Ambulatory Visit: Payer: Self-pay

## 2014-09-22 DIAGNOSIS — E039 Hypothyroidism, unspecified: Secondary | ICD-10-CM

## 2014-09-22 DIAGNOSIS — D649 Anemia, unspecified: Secondary | ICD-10-CM

## 2014-09-22 LAB — TSH: TSH: 1.147 u[IU]/mL (ref 0.350–4.500)

## 2014-09-22 LAB — T4, FREE: FREE T4: 1.09 ng/dL (ref 0.80–1.80)

## 2014-09-23 LAB — CBC WITH DIFFERENTIAL/PLATELET
Basophils Absolute: 0.1 10*3/uL (ref 0.0–0.1)
Basophils Relative: 1 % (ref 0–1)
Eosinophils Absolute: 0 10*3/uL (ref 0.0–0.7)
Eosinophils Relative: 0 % (ref 0–5)
HEMATOCRIT: 36.7 % (ref 36.0–46.0)
HEMOGLOBIN: 11.6 g/dL — AB (ref 12.0–15.0)
Lymphocytes Relative: 8 % — ABNORMAL LOW (ref 12–46)
Lymphs Abs: 1 10*3/uL (ref 0.7–4.0)
MCH: 27.5 pg (ref 26.0–34.0)
MCHC: 31.6 g/dL (ref 30.0–36.0)
MCV: 87 fL (ref 78.0–100.0)
MPV: 11 fL (ref 9.4–12.4)
Monocytes Absolute: 0.4 10*3/uL (ref 0.1–1.0)
Monocytes Relative: 3 % (ref 3–12)
Neutro Abs: 10.7 10*3/uL — ABNORMAL HIGH (ref 1.7–7.7)
Neutrophils Relative %: 88 % — ABNORMAL HIGH (ref 43–77)
PLATELETS: 427 10*3/uL — AB (ref 150–400)
RBC: 4.22 MIL/uL (ref 3.87–5.11)
RDW: 16.8 % — ABNORMAL HIGH (ref 11.5–15.5)
WBC: 12.2 10*3/uL — AB (ref 4.0–10.5)

## 2014-11-12 ENCOUNTER — Ambulatory Visit (INDEPENDENT_AMBULATORY_CARE_PROVIDER_SITE_OTHER): Payer: Medicare PPO | Admitting: Family Medicine

## 2014-11-12 ENCOUNTER — Encounter: Payer: Self-pay | Admitting: Family Medicine

## 2014-11-12 VITALS — BP 164/73 | HR 96 | Temp 97.9°F | Resp 16 | Wt 100.0 lb

## 2014-11-12 DIAGNOSIS — R8290 Unspecified abnormal findings in urine: Secondary | ICD-10-CM

## 2014-11-12 DIAGNOSIS — N390 Urinary tract infection, site not specified: Secondary | ICD-10-CM

## 2014-11-12 DIAGNOSIS — E782 Mixed hyperlipidemia: Secondary | ICD-10-CM

## 2014-11-12 DIAGNOSIS — D649 Anemia, unspecified: Secondary | ICD-10-CM

## 2014-11-12 DIAGNOSIS — I10 Essential (primary) hypertension: Secondary | ICD-10-CM

## 2014-11-12 DIAGNOSIS — R300 Dysuria: Secondary | ICD-10-CM

## 2014-11-12 LAB — LIPID PANEL
Cholesterol: 171 mg/dL (ref 0–200)
HDL: 65 mg/dL (ref >39)
LDL CALC: 29 mg/dL (ref 0–99)
Total CHOL/HDL Ratio: 2.6 Ratio
Triglycerides: 384 mg/dL — ABNORMAL HIGH (ref <150)
VLDL: 77 mg/dL — ABNORMAL HIGH (ref 0–40)

## 2014-11-12 LAB — POCT URINALYSIS DIPSTICK
Glucose, UA: NEGATIVE
NITRITE UA: POSITIVE
PH UA: 5
Spec Grav, UA: >=1.03
UROBILINOGEN UA: 4

## 2014-11-12 LAB — CBC
HCT: 39.5 % (ref 36.0–46.0)
Hemoglobin: 12.3 g/dL (ref 12.0–15.0)
MCH: 27.2 pg (ref 26.0–34.0)
MCHC: 31.1 g/dL (ref 30.0–36.0)
MCV: 87.2 fL (ref 78.0–100.0)
MPV: 10.8 fL (ref 8.6–12.4)
Platelets: 352 10*3/uL (ref 150–400)
RBC: 4.53 MIL/uL (ref 3.87–5.11)
RDW: 15.6 % — ABNORMAL HIGH (ref 11.5–15.5)
WBC: 10.6 10*3/uL — ABNORMAL HIGH (ref 4.0–10.5)

## 2014-11-12 LAB — COMPREHENSIVE METABOLIC PANEL
ALT: 14 U/L (ref 0–35)
AST: 20 U/L (ref 0–37)
Albumin: 3.7 g/dL (ref 3.5–5.2)
Alkaline Phosphatase: 61 U/L (ref 39–117)
BILIRUBIN TOTAL: 0.2 mg/dL (ref 0.2–1.2)
BUN: 24 mg/dL — ABNORMAL HIGH (ref 6–23)
CO2: 35 mEq/L — ABNORMAL HIGH (ref 19–32)
CREATININE: 0.58 mg/dL (ref 0.50–1.10)
Calcium: 8.4 mg/dL (ref 8.4–10.5)
Chloride: 99 mEq/L (ref 96–112)
Glucose, Bld: 118 mg/dL — ABNORMAL HIGH (ref 70–99)
POTASSIUM: 3.6 meq/L (ref 3.5–5.3)
Sodium: 144 mEq/L (ref 135–145)
Total Protein: 6.1 g/dL (ref 6.0–8.3)

## 2014-11-12 LAB — TSH: TSH: 1.504 u[IU]/mL (ref 0.350–4.500)

## 2014-11-12 MED ORDER — FERROUS FUMARATE-FOLIC ACID 324-1 MG PO TABS: 1.0000 | ORAL_TABLET | Freq: Every day | ORAL | Status: DC

## 2014-11-12 MED ORDER — NITROFURANTOIN MONOHYD MACRO 100 MG PO CAPS: 100.0000 mg | ORAL_CAPSULE | Freq: Two times a day (BID) | ORAL | Status: DC

## 2014-11-12 NOTE — Progress Notes (Signed)
Pre visit review using our clinic review tool, if applicable. No additional management support is needed unless otherwise documented below in the visit note. 

## 2014-11-12 NOTE — Patient Instructions (Signed)

## 2014-11-13 ENCOUNTER — Emergency Department (HOSPITAL_BASED_OUTPATIENT_CLINIC_OR_DEPARTMENT_OTHER): Payer: Medicare PPO

## 2014-11-13 ENCOUNTER — Encounter (HOSPITAL_BASED_OUTPATIENT_CLINIC_OR_DEPARTMENT_OTHER): Payer: Self-pay | Admitting: Emergency Medicine

## 2014-11-13 ENCOUNTER — Inpatient Hospital Stay (HOSPITAL_BASED_OUTPATIENT_CLINIC_OR_DEPARTMENT_OTHER)
Admission: EM | Admit: 2014-11-13 | Discharge: 2014-11-17 | DRG: 191 | Disposition: A | Discharge status: Home / Self Care | Payer: Medicare PPO | Attending: Internal Medicine | Admitting: Internal Medicine

## 2014-11-13 DIAGNOSIS — N390 Urinary tract infection, site not specified: Secondary | ICD-10-CM | POA: Diagnosis present

## 2014-11-13 DIAGNOSIS — E785 Hyperlipidemia, unspecified: Secondary | ICD-10-CM | POA: Diagnosis not present

## 2014-11-13 DIAGNOSIS — R0602 Shortness of breath: Secondary | ICD-10-CM

## 2014-11-13 DIAGNOSIS — Z79899 Other long term (current) drug therapy: Secondary | ICD-10-CM | POA: Diagnosis not present

## 2014-11-13 DIAGNOSIS — Z90710 Acquired absence of both cervix and uterus: Secondary | ICD-10-CM

## 2014-11-13 DIAGNOSIS — K219 Gastro-esophageal reflux disease without esophagitis: Secondary | ICD-10-CM | POA: Diagnosis present

## 2014-11-13 DIAGNOSIS — I10 Essential (primary) hypertension: Secondary | ICD-10-CM | POA: Diagnosis present

## 2014-11-13 DIAGNOSIS — Z888 Allergy status to other drugs, medicaments and biological substances status: Secondary | ICD-10-CM

## 2014-11-13 DIAGNOSIS — B9620 Unspecified Escherichia coli [E. coli] as the cause of diseases classified elsewhere: Secondary | ICD-10-CM | POA: Diagnosis present

## 2014-11-13 DIAGNOSIS — Z1623 Resistance to quinolones and fluoroquinolones: Secondary | ICD-10-CM | POA: Diagnosis present

## 2014-11-13 DIAGNOSIS — J449 Chronic obstructive pulmonary disease, unspecified: Secondary | ICD-10-CM | POA: Diagnosis present

## 2014-11-13 DIAGNOSIS — Z885 Allergy status to narcotic agent status: Secondary | ICD-10-CM | POA: Diagnosis not present

## 2014-11-13 DIAGNOSIS — Z7952 Long term (current) use of systemic steroids: Secondary | ICD-10-CM | POA: Diagnosis not present

## 2014-11-13 DIAGNOSIS — Z9981 Dependence on supplemental oxygen: Secondary | ICD-10-CM | POA: Diagnosis not present

## 2014-11-13 DIAGNOSIS — J441 Chronic obstructive pulmonary disease with (acute) exacerbation: Secondary | ICD-10-CM | POA: Diagnosis not present

## 2014-11-13 DIAGNOSIS — R6510 Systemic inflammatory response syndrome (SIRS) of non-infectious origin without acute organ dysfunction: Secondary | ICD-10-CM

## 2014-11-13 DIAGNOSIS — F418 Other specified anxiety disorders: Secondary | ICD-10-CM | POA: Diagnosis not present

## 2014-11-13 DIAGNOSIS — D649 Anemia, unspecified: Secondary | ICD-10-CM | POA: Diagnosis present

## 2014-11-13 DIAGNOSIS — R0603 Acute respiratory distress: Secondary | ICD-10-CM

## 2014-11-13 DIAGNOSIS — Z7951 Long term (current) use of inhaled steroids: Secondary | ICD-10-CM

## 2014-11-13 DIAGNOSIS — J9611 Chronic respiratory failure with hypoxia: Secondary | ICD-10-CM

## 2014-11-13 DIAGNOSIS — E039 Hypothyroidism, unspecified: Secondary | ICD-10-CM | POA: Diagnosis present

## 2014-11-13 DIAGNOSIS — J9610 Chronic respiratory failure, unspecified whether with hypoxia or hypercapnia: Secondary | ICD-10-CM | POA: Diagnosis not present

## 2014-11-13 LAB — STREP PNEUMONIAE URINARY ANTIGEN: Strep Pneumo Urinary Antigen: NEGATIVE

## 2014-11-13 LAB — COMPREHENSIVE METABOLIC PANEL
ALT: 30 U/L (ref 0–35)
AST: 52 U/L — ABNORMAL HIGH (ref 0–37)
Albumin: 4.1 g/dL (ref 3.5–5.2)
Alkaline Phosphatase: 79 U/L (ref 39–117)
Anion gap: 7 (ref 5–15)
BUN: 22 mg/dL (ref 6–23)
CHLORIDE: 96 meq/L (ref 96–112)
CO2: 35 mmol/L — ABNORMAL HIGH (ref 19–32)
Calcium: 8.8 mg/dL (ref 8.4–10.5)
Creatinine, Ser: 0.43 mg/dL — ABNORMAL LOW (ref 0.50–1.10)
GFR calc Af Amer: >90 mL/min (ref >90)
GLUCOSE: 129 mg/dL — AB (ref 70–99)
POTASSIUM: 3.9 mmol/L (ref 3.5–5.1)
Sodium: 138 mmol/L (ref 135–145)
Total Bilirubin: 0.3 mg/dL (ref 0.3–1.2)
Total Protein: 7 g/dL (ref 6.0–8.3)

## 2014-11-13 LAB — I-STAT ARTERIAL BLOOD GAS, ED
ACID-BASE EXCESS: 9 mmol/L — AB (ref 0.0–2.0)
BICARBONATE: 37.1 meq/L — AB (ref 20.0–24.0)
O2 SAT: 95 %
PCO2 ART: 70 mmHg — AB (ref 35.0–45.0)
TCO2: 39 mmol/L (ref 0–100)
pH, Arterial: 7.338 — ABNORMAL LOW (ref 7.350–7.450)
pO2, Arterial: 90 mmHg (ref 80.0–100.0)

## 2014-11-13 LAB — URINALYSIS, ROUTINE W REFLEX MICROSCOPIC
Bilirubin Urine: NEGATIVE
Glucose, UA: NEGATIVE mg/dL
Hgb urine dipstick: NEGATIVE
KETONES UR: 15 mg/dL — AB
NITRITE: NEGATIVE
PH: 6 (ref 5.0–8.0)
PROTEIN: NEGATIVE mg/dL
SPECIFIC GRAVITY, URINE: 1.017 (ref 1.005–1.030)
Urobilinogen, UA: 0.2 mg/dL (ref 0.0–1.0)

## 2014-11-13 LAB — URINE MICROSCOPIC-ADD ON

## 2014-11-13 LAB — D-DIMER, QUANTITATIVE (NOT AT ARMC): D-Dimer, Quant: 0.68 ug/mL-FEU — ABNORMAL HIGH (ref 0.00–0.48)

## 2014-11-13 LAB — CBC WITH DIFFERENTIAL/PLATELET
BASOS PCT: 0 % (ref 0–1)
Basophils Absolute: 0 10*3/uL (ref 0.0–0.1)
Eosinophils Absolute: 0.2 10*3/uL (ref 0.0–0.7)
Eosinophils Relative: 2 % (ref 0–5)
HCT: 41.3 % (ref 36.0–46.0)
Hemoglobin: 12.6 g/dL (ref 12.0–15.0)
Lymphocytes Relative: 6 % — ABNORMAL LOW (ref 12–46)
Lymphs Abs: 0.7 10*3/uL (ref 0.7–4.0)
MCH: 27.8 pg (ref 26.0–34.0)
MCHC: 30.5 g/dL (ref 30.0–36.0)
MCV: 91 fL (ref 78.0–100.0)
MONO ABS: 0.6 10*3/uL (ref 0.1–1.0)
Monocytes Relative: 5 % (ref 3–12)
NEUTROS ABS: 10.8 10*3/uL — AB (ref 1.7–7.7)
NEUTROS PCT: 87 % — AB (ref 43–77)
PLATELETS: 299 10*3/uL (ref 150–400)
RBC: 4.54 MIL/uL (ref 3.87–5.11)
RDW: 15.9 % — ABNORMAL HIGH (ref 11.5–15.5)
WBC: 12.5 10*3/uL — ABNORMAL HIGH (ref 4.0–10.5)

## 2014-11-13 LAB — BRAIN NATRIURETIC PEPTIDE: B Natriuretic Peptide: 88.5 pg/mL (ref 0.0–100.0)

## 2014-11-13 LAB — I-STAT CG4 LACTIC ACID, ED: Lactic Acid, Venous: 1.14 mmol/L (ref 0.5–2.2)

## 2014-11-13 MED ORDER — LORATADINE 10 MG PO TABS
10.0000 mg | ORAL_TABLET | Freq: Every day | ORAL | Status: DC
  Administered 2014-11-14 – 2014-11-17 (×4): 10 mg via ORAL
  Filled 2014-11-13 (×4): qty 1

## 2014-11-13 MED ORDER — ACETAMINOPHEN 500 MG PO TABS
500.0000 mg | ORAL_TABLET | Freq: Four times a day (QID) | ORAL | Status: DC | PRN
  Administered 2014-11-15 – 2014-11-16 (×2): 500 mg via ORAL
  Filled 2014-11-13 (×2): qty 1

## 2014-11-13 MED ORDER — VANCOMYCIN HCL IN DEXTROSE 1-5 GM/200ML-% IV SOLN
1000.0000 mg | Freq: Once | INTRAVENOUS | Status: AC
  Administered 2014-11-13: 1000 mg via INTRAVENOUS
  Filled 2014-11-13: qty 200

## 2014-11-13 MED ORDER — SODIUM CHLORIDE 0.9 % IV SOLN
Freq: Once | INTRAVENOUS | Status: AC
  Administered 2014-11-13: 16:00:00 via INTRAVENOUS

## 2014-11-13 MED ORDER — VANCOMYCIN HCL IN DEXTROSE 1-5 GM/200ML-% IV SOLN: 1000.0000 mg | INTRAVENOUS | Status: DC

## 2014-11-13 MED ORDER — METHYLPREDNISOLONE SODIUM SUCC 125 MG IJ SOLR
60.0000 mg | Freq: Three times a day (TID) | INTRAMUSCULAR | Status: DC
  Administered 2014-11-13 – 2014-11-14 (×2): 60 mg via INTRAVENOUS
  Filled 2014-11-13 (×3): qty 2
  Filled 2014-11-13 (×3): qty 0.96

## 2014-11-13 MED ORDER — ACETAMINOPHEN 325 MG PO TABS
650.0000 mg | ORAL_TABLET | Freq: Once | ORAL | Status: AC
  Administered 2014-11-13: 650 mg via ORAL
  Filled 2014-11-13: qty 2

## 2014-11-13 MED ORDER — GLUCOSAMINE-CHONDROITIN 500-400 MG PO CAPS: 1.0000 | ORAL_CAPSULE | Freq: Two times a day (BID) | ORAL | Status: DC

## 2014-11-13 MED ORDER — ALBUTEROL (5 MG/ML) CONTINUOUS INHALATION SOLN
20.0000 mg/h | INHALATION_SOLUTION | Freq: Once | RESPIRATORY_TRACT | Status: AC
  Administered 2014-11-13: 20 mg/h via RESPIRATORY_TRACT
  Filled 2014-11-13: qty 20

## 2014-11-13 MED ORDER — LEVOFLOXACIN IN D5W 750 MG/150ML IV SOLN
750.0000 mg | INTRAVENOUS | Status: DC
  Administered 2014-11-13: 750 mg via INTRAVENOUS
  Filled 2014-11-13 (×2): qty 150

## 2014-11-13 MED ORDER — COENZYME Q10 100 MG PO CAPS: 100.0000 mg | ORAL_CAPSULE | Freq: Every day | ORAL | Status: DC

## 2014-11-13 MED ORDER — CALCIUM CARBONATE-VITAMIN D 500-200 MG-UNIT PO TABS
1.0000 | ORAL_TABLET | Freq: Two times a day (BID) | ORAL | Status: DC
  Administered 2014-11-13 – 2014-11-17 (×8): 1 via ORAL
  Filled 2014-11-13 (×9): qty 1

## 2014-11-13 MED ORDER — MIRTAZAPINE 15 MG PO TABS
15.0000 mg | ORAL_TABLET | Freq: Every day | ORAL | Status: DC
Start: 2014-11-13 — End: 2014-11-17
  Administered 2014-11-13 – 2014-11-16 (×4): 15 mg via ORAL
  Filled 2014-11-13 (×5): qty 1

## 2014-11-13 MED ORDER — CEFEPIME HCL 2 G IJ SOLR: 2.0000 g | INTRAMUSCULAR | Status: DC

## 2014-11-13 MED ORDER — BENZONATATE 100 MG PO CAPS
200.0000 mg | ORAL_CAPSULE | Freq: Three times a day (TID) | ORAL | Status: DC | PRN
  Filled 2014-11-13: qty 2

## 2014-11-13 MED ORDER — SODIUM CHLORIDE 0.9 % IV SOLN
Freq: Once | INTRAVENOUS | Status: AC
  Administered 2014-11-13: 21:00:00 via INTRAVENOUS

## 2014-11-13 MED ORDER — CEFEPIME HCL 2 G IJ SOLR
INTRAMUSCULAR | Status: AC
  Filled 2014-11-13: qty 2

## 2014-11-13 MED ORDER — ALBUTEROL SULFATE (2.5 MG/3ML) 0.083% IN NEBU: 2.5000 mg | INHALATION_SOLUTION | RESPIRATORY_TRACT | Status: DC | PRN

## 2014-11-13 MED ORDER — ALPRAZOLAM 0.5 MG PO TABS
0.5000 mg | ORAL_TABLET | Freq: Two times a day (BID) | ORAL | Status: DC | PRN
  Administered 2014-11-14 – 2014-11-16 (×3): 0.5 mg via ORAL
  Filled 2014-11-13 (×3): qty 1

## 2014-11-13 MED ORDER — CITALOPRAM HYDROBROMIDE 20 MG PO TABS
20.0000 mg | ORAL_TABLET | Freq: Every day | ORAL | Status: DC
  Administered 2014-11-13 – 2014-11-17 (×5): 20 mg via ORAL
  Filled 2014-11-13 (×5): qty 1

## 2014-11-13 MED ORDER — DEXTROSE 5 % IV SOLN
2.0000 g | Freq: Once | INTRAVENOUS | Status: AC
  Administered 2014-11-13: 2 g via INTRAVENOUS

## 2014-11-13 MED ORDER — LEVOFLOXACIN IN D5W 500 MG/100ML IV SOLN: 500.0000 mg | INTRAVENOUS | Status: DC

## 2014-11-13 MED ORDER — FERROUS FUMARATE 325 (106 FE) MG PO TABS
1.0000 | ORAL_TABLET | Freq: Every day | ORAL | Status: DC
  Administered 2014-11-14 – 2014-11-17 (×4): 106 mg via ORAL
  Filled 2014-11-13 (×5): qty 1

## 2014-11-13 MED ORDER — ARFORMOTEROL TARTRATE 15 MCG/2ML IN NEBU
15.0000 ug | INHALATION_SOLUTION | Freq: Two times a day (BID) | RESPIRATORY_TRACT | Status: DC
  Filled 2014-11-13 (×3): qty 2

## 2014-11-13 MED ORDER — FERROUS FUMARATE-FOLIC ACID 324-1 MG PO TABS: 1.0000 | ORAL_TABLET | Freq: Every day | ORAL | Status: DC

## 2014-11-13 MED ORDER — ALBUTEROL SULFATE (2.5 MG/3ML) 0.083% IN NEBU: 5.0000 mg | INHALATION_SOLUTION | Freq: Once | RESPIRATORY_TRACT | Status: DC

## 2014-11-13 MED ORDER — PANTOPRAZOLE SODIUM 40 MG PO TBEC
40.0000 mg | DELAYED_RELEASE_TABLET | Freq: Every day | ORAL | Status: DC
  Administered 2014-11-13 – 2014-11-17 (×5): 40 mg via ORAL
  Filled 2014-11-13 (×6): qty 1

## 2014-11-13 MED ORDER — HEPARIN SODIUM (PORCINE) 5000 UNIT/ML IJ SOLN
5000.0000 [IU] | Freq: Three times a day (TID) | INTRAMUSCULAR | Status: DC
  Administered 2014-11-13 – 2014-11-17 (×11): 5000 [IU] via SUBCUTANEOUS
  Filled 2014-11-13 (×14): qty 1

## 2014-11-13 MED ORDER — IPRATROPIUM BROMIDE 0.02 % IN SOLN
0.5000 mg | Freq: Once | RESPIRATORY_TRACT | Status: AC
  Administered 2014-11-13: 0.5 mg via RESPIRATORY_TRACT
  Filled 2014-11-13: qty 2.5

## 2014-11-13 MED ORDER — METHYLPREDNISOLONE SODIUM SUCC 125 MG IJ SOLR
125.0000 mg | Freq: Once | INTRAMUSCULAR | Status: AC
  Administered 2014-11-13: 125 mg via INTRAVENOUS
  Filled 2014-11-13: qty 2

## 2014-11-13 MED ORDER — FOLIC ACID 1 MG PO TABS
1.0000 mg | ORAL_TABLET | Freq: Every day | ORAL | Status: DC
  Administered 2014-11-14 – 2014-11-17 (×4): 1 mg via ORAL
  Filled 2014-11-13 (×4): qty 1

## 2014-11-13 MED ORDER — IPRATROPIUM-ALBUTEROL 0.5-2.5 (3) MG/3ML IN SOLN
3.0000 mL | RESPIRATORY_TRACT | Status: DC
  Administered 2014-11-13 – 2014-11-14 (×6): 3 mL via RESPIRATORY_TRACT
  Filled 2014-11-13 (×6): qty 3

## 2014-11-13 MED ORDER — BUDESONIDE 0.25 MG/2ML IN SUSP
0.2500 mg | Freq: Two times a day (BID) | RESPIRATORY_TRACT | Status: DC
  Administered 2014-11-13 – 2014-11-17 (×8): 0.25 mg via RESPIRATORY_TRACT
  Filled 2014-11-13 (×11): qty 2

## 2014-11-13 NOTE — H&P (Signed)
[facility] History and Physical  [name] QIO:[id number] DOB: February 10, 1941 DOA: 11/13/2014  Referring physician: ED physician PCP: Penni Homans, MD  Specialists:   Chief Complaint: Shortness of breath  HPI: [name] is a 74 y.o. female with past medical history of hypertension, hyperlipidemia, GERD, COPD on 3 L oxygen at home, depression, anxiety, who presents with shortness of breath.  Patient reports that yesterday she was started treatment for UTI by her PCP because of dysuria. She was given description of nitrofurantoin. She feels better after started taking nitrofurantoin. Since this morning, she started having worsening shortness of breath. She has chronic cough due to COPD which has not significantly worsened. No sputum production. She has fever, headache and nausea. She does not have vomiting, diarrhea or abdominal pain. She denies any chest pain. She reports that she traveled recently by driving for 3 hours to and from, totally 6 hours. She does not have tenderness over the calf areas. Patient denies chest pain, abdominal pain, diarrhea, skin rashes or leg swelling.  Work up in the ED demonstrates elevated d-dimer, positive urinalysis for UTI, BNP 88, lactate 1.14, leukocytosis with WBC 12.5 (patient is on prednisone 5 mg daily). chest x-ray showed COPD without infiltration.  Review of Systems: As presented in the history of presenting illness, rest negative.  Where does patient live?  At home Can patient participate in ADLs? Yes  Allergy:  Allergies  Allergen Reactions  . Ambien [Zolpidem]     anxiety  . Cymbalta [Duloxetine Hcl]     anxiety  . Morphine     REACTION: nightmares    Past Medical History  Diagnosis Date  . COPD (chronic obstructive pulmonary disease)   . Thyroid disease   . Glucose intolerance (impaired glucose tolerance)   . History of small bowel obstruction   . Arthralgia   . Dyspnea   . Pallor   . Chest pain   . Osteoporosis    . Dysuria   . Weight loss   . Edema   . Weakness   . Anxiety   . Depression   . Migraine   . Hypertension   . Leukocytosis   . GERD (gastroesophageal reflux disease)   . Atrophic vaginitis 12/07/2012  . Hx of adenomatous colonic polyps   . Ischemic colitis   . Nocturia 09/02/2013  . Diarrhea 09/02/2013  . Hyperglycemia 12/01/2013    Past Surgical History  Procedure Laterality Date  . Cholecystectomy    . Abdominal hysterectomy  1993  . Tubal ligation    . Bladder repair    . Dilation and curettage of uterus      40 yrs ago  . Eye surgery  04/2011    bil. eyes  . Colonoscopy with propofol N/A 06/08/2013    Procedure: COLONOSCOPY WITH PROPOFOL;  Surgeon: Lafayette Dragon, MD;  Location: [facility];  Service: Endoscopy;  Laterality: N/A;    Social History:  reports that she quit smoking about 13 years ago. Her smoking use included Cigarettes. She has a 80 pack-year smoking history. She has never used smokeless tobacco. She reports that she does not drink alcohol or use illicit drugs.  Family History:  Family History  Problem Relation Age of Onset  . Alcohol abuse Mother   . Heart disease Mother   . Stroke Mother   . Hypertension Mother   . Kidney disease Mother   . COPD Mother   . Osteoporosis Mother     hip fracture  .  COPD Brother   . Alcohol abuse Brother     drug abuse  . Hearing loss Brother     mvp  . Colon cancer Paternal Aunt   . Prostate cancer    . Alcohol abuse Father     MVA  . Thyroid disease Daughter   . COPD Paternal Grandfather   . COPD Sister   . Hypertension Sister   . Anxiety disorder Sister   . COPD Sister   . COPD Brother     current smoker  . Hearing loss Brother     cad  . COPD Brother      Prior to Admission medications   Medication Sig Start Date End Date Taking? Authorizing Provider  albuterol (PROVENTIL HFA;VENTOLIN HFA) 108 (90 BASE) MCG/ACT inhaler Inhale 2 puffs into the lungs every 6 (six) hours as needed for wheezing or  shortness of breath. 08/05/14   Rigoberto Noel, MD  albuterol (PROVENTIL) (2.5 MG/3ML) 0.083% nebulizer solution Take 3 mLs (2.5 mg total) by nebulization every 4 (four) hours as needed for wheezing or shortness of breath. Dx 496 04/22/14   Rigoberto Noel, MD  ALPRAZolam Duanne Moron) 0.5 MG tablet 1 tab po bid and 2 tabs po qhs prn anxiety, insomnia 08/10/14   Mosie Lukes, MD  arformoterol (BROVANA) 15 MCG/2ML NEBU Take 2 mLs (15 mcg total) by nebulization 2 (two) times daily. Dx 496 04/22/14   Rigoberto Noel, MD  benzonatate (TESSALON) 200 MG capsule Take 1 capsule (200 mg total) by mouth 3 (three) times daily as needed for cough. 03/10/13   Tammy S Parrett, NP  budesonide (PULMICORT) 0.25 MG/2ML nebulizer solution Take 2 mLs (0.25 mg total) by nebulization 2 (two) times daily. Dx: 496 04/22/14   Rigoberto Noel, MD  Calcium Carbonate-Vitamin D 600-400 MG-UNIT per tablet Take 1 tablet by mouth 2 (two) times daily.      Historical Provider, MD  cetirizine (ZYRTEC) 10 MG tablet Take 10 mg by mouth daily.    Historical Provider, MD  citalopram (CELEXA) 20 MG tablet Take 1 tablet (20 mg total) by mouth daily. 08/10/14   Mosie Lukes, MD  Coenzyme Q10 (CVS COENZYME Q10) 100 MG capsule Take 100 mg by mouth daily.      Historical Provider, MD  Ferrous Fumarate-Folic Acid (HEMOCYTE-F) 324-1 MG TABS Take 1 tablet by mouth daily. 11/12/14   Mosie Lukes, MD  fexofenadine (ALLEGRA) 180 MG tablet Take 180 mg by mouth daily as needed (for allergies).     Historical Provider, MD  Glucosamine-Chondroitin (GLUCOSAMINE CHONDR COMPLEX PO) Take 1 tablet by mouth 2 (two) times daily.     Historical Provider, MD  ipratropium (ATROVENT) 0.02 % nebulizer solution Take 500 mcg by nebulization every 6 (six) hours as needed. Dx 496 07/14/13   Tammy S Parrett, NP  mirtazapine (REMERON) 15 MG tablet Take 1 tablet (15 mg total) by mouth at bedtime. 08/10/14   Mosie Lukes, MD  nitrofurantoin, macrocrystal-monohydrate, (MACROBID) 100 MG  capsule Take 1 capsule (100 mg total) by mouth 2 (two) times daily. 11/12/14   Mosie Lukes, MD  omeprazole-sodium bicarbonate (ZEGERID) 40-1100 MG per capsule Take 1 capsule by mouth 2 (two) times daily. 08/10/14   Mosie Lukes, MD  predniSONE (DELTASONE) 10 MG tablet Take 0.5 tablets (5 mg total) by mouth daily. 04/22/14   Rigoberto Noel, MD    Physical Exam: Filed Vitals:   11/13/14 1730 11/13/14 1805 11/13/14 1818 11/13/14 1913  BP: 112/58 108/62  102/52  Pulse: 112 106 104 110  Temp:    98.3 F (36.8 C)  TempSrc:    Oral  Resp: 26 29 26 20   SpO2: 95% 97% 97% 94%   General: Not in acute distress HEENT:       Eyes: PERRL, EOMI, no scleral icterus       ENT: No discharge from the ears and nose, no pharynx injection, no tonsillar enlargement.        Neck: No JVD, no bruit, no mass felt. Cardiac: S1/S2, RRR, No murmurs, No gallops or rubs Pulm: severely decreased air movement bilaterally. Mild wheezing bilaterally.  Abd: Soft, nondistended, nontender, no rebound pain, no organomegaly, BS present Ext: No edema bilaterally. 2+DP/PT pulse bilaterally Musculoskeletal: No joint deformities, erythema, or stiffness, ROM full Skin: No rashes.  Neuro: Alert and oriented X3, cranial nerves II-XII grossly intact, muscle strength 5/5 in all extremeties, sensation to light touch intact. Brachial reflex 2+ bilaterally. Knee reflex 1+ bilaterally. Negative Babinski's sign. Normal finger to nose test. Psych: Patient is not psychotic, no suicidal or hemocidal ideation.  Labs on Admission:  Basic Metabolic Panel:  Recent Labs Lab 11/12/14 1513 11/13/14 1445  NA 144 138  K 3.6 3.9  CL 99 96  CO2 35* 35*  GLUCOSE 118* 129*  BUN 24* 22  CREATININE 0.58 0.43*  CALCIUM 8.4 8.8   Liver Function Tests:  Recent Labs Lab 11/12/14 1513 11/13/14 1445  AST 20 52*  ALT 14 30  ALKPHOS 61 79  BILITOT 0.2 0.3  PROT 6.1 7.0  ALBUMIN 3.7 4.1   No results for input(s): LIPASE, AMYLASE in the  last 168 hours. No results for input(s): AMMONIA in the last 168 hours. CBC:  Recent Labs Lab 11/12/14 1513 11/13/14 1445  WBC 10.6* 12.5*  NEUTROABS  --  10.8*  HGB 12.3 12.6  HCT 39.5 41.3  MCV 87.2 91.0  PLT 352 299   Cardiac Enzymes: No results for input(s): CKTOTAL, CKMB, CKMBINDEX, TROPONINI in the last 168 hours.  BNP (last 3 results) No results for input(s): PROBNP in the last 8760 hours. CBG: No results for input(s): GLUCAP in the last 168 hours.  Radiological Exams on Admission: Dg Chest Port 1 View  11/13/2014   CLINICAL DATA:  Patient c/o increased shortness of breath that started this morning. Hx of copd and htn. Former smoker.  EXAM: PORTABLE CHEST - 1 VIEW  COMPARISON:  08/16/2014.  FINDINGS: Borderline enlarged cardiac silhouette with a mild increase in size. Increased prominence of the pulmonary vasculature and interstitial markings. The lungs remain hyperexpanded. No pleural fluid. Mild scoliosis.  IMPRESSION: Interval borderline cardiomegaly and mild changes of congestive heart failure superimposed on COPD.   Electronically Signed   By: Enrique Sack M.D.   On: 11/13/2014 15:04    EKG: Independently reviewed. RAD and RAE  Assessment/Plan Principal Problem:   COPD exacerbation Active Problems:   Hypothyroidism   Depression with anxiety   HYPERTENSION, BENIGN ESSENTIAL   GERD   Anemia   UTI (lower urinary tract infection)   Chronic respiratory failure  SOB: It is most likely due to COPD exacerbation given severely decreased air movement and wheezing on auscultation. CHF is unlikely given normal BNP. Since patient had recent long distant traveling and elevated d-dimer, it is important to rule out pulmonary embolism.  - will admit patient to telemetry bed  - Nebulizers: DuoNeb and albuterol, plus home Ar formoterol, Pulmicort - Solu-Medrol 60 mg IV  q8h  - levaquine IV - urine legionella and S. pneumococcal antigen - follow up blood culture x2, sputum  culture and her respiratory events panel - Tessalon for cough - CTA to r/o PE  Hypothyroidism: TSH was 1.504 on 11/12/14. On Synthroid -Continue Synthroid  UTI:  -on Levaquin -Follow-up urine culture  Anxiety and depression: No suicidal homicidal ideations -When necessary Xanax -Celexa  GERD:: -Protonix  DVT ppx: SQ Heparin    Code Status: Full code Family Communication: None at bed side.   Disposition Plan: Admit to inpatient   Date of Service 11/13/2014    Ivor Costa [facility] Pager [phone]  If 7PM-7AM, please contact night-coverage [email or web address] Password [facility] 11/13/2014, 7:48 PM

## 2014-11-13 NOTE — Progress Notes (Signed)
Called to CT to notify STAT CT-angio.

## 2014-11-13 NOTE — Progress Notes (Addendum)
74 y.o. female with history of COPD on oxygen who presents emergency department with fever, tachycardia, tachypnea. Patient has COPD exacerbation , normally on 3 L but now on 4 L and meets SIRS criteria. Recently started antibiotics yesterday for UTI. Will obtain cultures, chest x-ray, urine. Will give broad-spectrum device, Solu-Medrol, continuous albuterol and admit to tele .

## 2014-11-13 NOTE — Progress Notes (Signed)
Report received from [place], RN from [facility] for admission to [phone]

## 2014-11-13 NOTE — ED Provider Notes (Signed)
Medical screening examination/treatment/procedure(s) were conducted as a shared visit with non-physician practitioner(s) and myself.  I personally evaluated the patient during the encounter.   EKG Interpretation   Date/Time:  Saturday November 13 2014 14:39:45 EST Ventricular Rate:  123 PR Interval:  148 QRS Duration: 70 QT Interval:  294 QTC Calculation: 420 R Axis:   94 Text Interpretation:  Sinus tachycardia Biatrial enlargement Rightward  axis Pulmonary disease pattern Abnormal ECG No significant change since  2005 except rate is faster Confirmed by WARD,  DO, KRISTEN [phone]) on  11/13/2014 2:43:15 PM      Pt is a 74 y.o. female with history of COPD on oxygen who presents emergency department with fever, tachycardia, tachypnea. Patient has COPD exacerbation and meets SIRS criteria. Recently started antibiotics yesterday for UTI. Will obtain cultures, chest x-ray, urine. Will give broad-spectrum device, Solu-Medrol, continuous albuterol and admit.  Dewy Rose, DO 11/13/14 1531

## 2014-11-13 NOTE — Progress Notes (Signed)
ANTIBIOTIC CONSULT NOTE - INITIAL  Pharmacy Consult for vancomycin, cefepime  Indication: pneumonia  Allergies  Allergen Reactions  . Ambien [Zolpidem]     anxiety  . Cymbalta [Duloxetine Hcl]     anxiety  . Morphine     REACTION: nightmares    Patient Measurements:   Adjusted Body Weight:   Vital Signs: Temp: 101.3 F (38.5 C) (01/16 1423) Temp Source: Oral (01/16 1423) BP: 178/110 mmHg (01/16 1423) Pulse Rate: 128 (01/16 1423) Intake/Output from previous day:   Intake/Output from this shift:    Labs:  Recent Labs  11/12/14 1513 11/13/14 1445  WBC 10.6* 12.5*  HGB 12.3 12.6  PLT 352 299  CREATININE 0.58  --    Medical History: Past Medical History  Diagnosis Date  . COPD (chronic obstructive pulmonary disease)   . Thyroid disease   . Glucose intolerance (impaired glucose tolerance)   . History of small bowel obstruction   . Arthralgia   . Dyspnea   . Pallor   . Chest pain   . Osteoporosis   . Dysuria   . Weight loss   . Edema   . Weakness   . Anxiety   . Depression   . Migraine   . Hypertension   . Leukocytosis   . GERD (gastroesophageal reflux disease)   . Atrophic vaginitis 12/07/2012  . Hx of adenomatous colonic polyps   . Ischemic colitis   . Nocturia 09/02/2013  . Diarrhea 09/02/2013  . Hyperglycemia 12/01/2013    Medications:  See EMR  Assessment: 74 yo female presents with SOB, pt is on 3L O2 at baseline and has a hx of COPD. WBC 12.5, tmax/24h 103, LA wnl, BP up to 178/110, UA 1/15 concerning for UTI, SCr <1, eCrCl 45 ml/min. Of note, pt was prescribed Macrobid yesterday as an outpt, this will not penetrate the urine in CrCl < 60 and has the chance of causing pulmonary toxicity when used in these patients, this could have precipitated the pt's current illness.  Goal of Therapy:  Vancomycin trough level 15-20 mcg/ml  Plan:  -Cefepime 2 g IV q24h -Vancomycin 1 g IV q24h -VT at steady state -Monitor cultures  Hughes Better,  PharmD, BCPS Clinical Pharmacist Pager: [phone] 11/13/2014 3:15 PM

## 2014-11-13 NOTE — Progress Notes (Addendum)
On-call provider  Dr Blaine Hamper notified of D-dimer result(0.68).

## 2014-11-13 NOTE — ED Notes (Signed)
Pt presents to ED with complaints of shortness of breath that started this morning

## 2014-11-13 NOTE — ED Notes (Signed)
Bailey Mech, EDPA at bedside.

## 2014-11-13 NOTE — ED Notes (Signed)
Spoke with Dr Leonides Schanz regarding NS bolus- VORB for NS at 100cc/hour, No bolus ordered

## 2014-11-13 NOTE — Progress Notes (Signed)
[facility] admissions notified of patients arrival. Looking for assigned MD for patient. Pt Alert and oriented x4. Oriented to room. Temp 98.3, BP 102/52, HR 110, Resp 20. Rash noted to back, otherwise skin intact. Report given to Night Shift RN

## 2014-11-13 NOTE — ED Provider Notes (Signed)
CSN: [id number]     Arrival date & time 11/13/14  1422 History   First MD Initiated Contact with Patient 11/13/14 1441     Chief Complaint  Patient presents with  . Shortness of Breath     (Consider location/radiation/quality/duration/timing/severity/associated sxs/prior Treatment) HPI Comments: 74 year old female with an extensive past medical history including COPD presenting to the ED with shortness of breath when she woke up earlier this morning. Patient reports she was up until 1:00 AM watching TV and feeling fine. She is on 3 L of oxygen 24 7, and needed to increase her oxygen to 4 L today. States she had a fever of 103 earlier today. Denies cough out of her normal. States this feels like her COPD acting up. She was seen by her PCP yesterday for her three-month checkup and diagnosed with a urinary tract infection and prescribed Macrobid. Denies abdominal pain, nausea or vomiting. Denies chest pain.  Patient is a 74 y.o. female presenting with shortness of breath. The history is provided by the patient.  Shortness of Breath Associated symptoms: fever     Past Medical History  Diagnosis Date  . COPD (chronic obstructive pulmonary disease)   . Thyroid disease   . Glucose intolerance (impaired glucose tolerance)   . History of small bowel obstruction   . Arthralgia   . Dyspnea   . Pallor   . Chest pain   . Osteoporosis   . Dysuria   . Weight loss   . Edema   . Weakness   . Anxiety   . Depression   . Migraine   . Hypertension   . Leukocytosis   . GERD (gastroesophageal reflux disease)   . Atrophic vaginitis 12/07/2012  . Hx of adenomatous colonic polyps   . Ischemic colitis   . Nocturia 09/02/2013  . Diarrhea 09/02/2013  . Hyperglycemia 12/01/2013   Past Surgical History  Procedure Laterality Date  . Cholecystectomy    . Abdominal hysterectomy  1993  . Tubal ligation    . Bladder repair    . Dilation and curettage of uterus      40 yrs ago  . Eye surgery  04/2011     bil. eyes  . Colonoscopy with propofol N/A 06/08/2013    Procedure: COLONOSCOPY WITH PROPOFOL;  Surgeon: Lafayette Dragon, MD;  Location: [facility];  Service: Endoscopy;  Laterality: N/A;   Family History  Problem Relation Age of Onset  . Alcohol abuse Mother   . Heart disease Mother   . Stroke Mother   . Hypertension Mother   . Kidney disease Mother   . COPD Mother   . Osteoporosis Mother     hip fracture  . COPD Brother   . Alcohol abuse Brother     drug abuse  . Hearing loss Brother     mvp  . Colon cancer Paternal Aunt   . Prostate cancer    . Alcohol abuse Father     MVA  . Thyroid disease Daughter   . COPD Paternal Grandfather   . COPD Sister   . Hypertension Sister   . Anxiety disorder Sister   . COPD Sister   . COPD Brother     current smoker  . Hearing loss Brother     cad  . COPD Brother    History  Substance Use Topics  . Smoking status: Former Smoker -- 2.00 packs/day for 40 years    Types: Cigarettes    Quit date: 10/29/2001  .  Smokeless tobacco: Never Used  . Alcohol Use: No   OB History    No data available     Review of Systems  Constitutional: Positive for fever.  Respiratory: Positive for shortness of breath.   All other systems reviewed and are negative.     Allergies  Ambien; Cymbalta; and Morphine  Home Medications   Prior to Admission medications   Medication Sig Start Date End Date Taking? Authorizing Provider  albuterol (PROVENTIL HFA;VENTOLIN HFA) 108 (90 BASE) MCG/ACT inhaler Inhale 2 puffs into the lungs every 6 (six) hours as needed for wheezing or shortness of breath. 08/05/14   Rigoberto Noel, MD  albuterol (PROVENTIL) (2.5 MG/3ML) 0.083% nebulizer solution Take 3 mLs (2.5 mg total) by nebulization every 4 (four) hours as needed for wheezing or shortness of breath. Dx 496 04/22/14   Rigoberto Noel, MD  ALPRAZolam Duanne Moron) 0.5 MG tablet 1 tab po bid and 2 tabs po qhs prn anxiety, insomnia 08/10/14   Mosie Lukes, MD   arformoterol (BROVANA) 15 MCG/2ML NEBU Take 2 mLs (15 mcg total) by nebulization 2 (two) times daily. Dx 496 04/22/14   Rigoberto Noel, MD  benzonatate (TESSALON) 200 MG capsule Take 1 capsule (200 mg total) by mouth 3 (three) times daily as needed for cough. 03/10/13   Tammy S Parrett, NP  budesonide (PULMICORT) 0.25 MG/2ML nebulizer solution Take 2 mLs (0.25 mg total) by nebulization 2 (two) times daily. Dx: 496 04/22/14   Rigoberto Noel, MD  Calcium Carbonate-Vitamin D 600-400 MG-UNIT per tablet Take 1 tablet by mouth 2 (two) times daily.      Historical Provider, MD  cetirizine (ZYRTEC) 10 MG tablet Take 10 mg by mouth daily.    Historical Provider, MD  citalopram (CELEXA) 20 MG tablet Take 1 tablet (20 mg total) by mouth daily. 08/10/14   Mosie Lukes, MD  Coenzyme Q10 ([employer] COENZYME Q10) 100 MG capsule Take 100 mg by mouth daily.      Historical Provider, MD  Ferrous Fumarate-Folic Acid (HEMOCYTE-F) 324-1 MG TABS Take 1 tablet by mouth daily. 11/12/14   Mosie Lukes, MD  fexofenadine (ALLEGRA) 180 MG tablet Take 180 mg by mouth daily as needed (for allergies).     Historical Provider, MD  Glucosamine-Chondroitin (GLUCOSAMINE CHONDR COMPLEX PO) Take 1 tablet by mouth 2 (two) times daily.     Historical Provider, MD  ipratropium (ATROVENT) 0.02 % nebulizer solution Take 500 mcg by nebulization every 6 (six) hours as needed. Dx 496 07/14/13   Tammy S Parrett, NP  mirtazapine (REMERON) 15 MG tablet Take 1 tablet (15 mg total) by mouth at bedtime. 08/10/14   Mosie Lukes, MD  nitrofurantoin, macrocrystal-monohydrate, (MACROBID) 100 MG capsule Take 1 capsule (100 mg total) by mouth 2 (two) times daily. 11/12/14   Mosie Lukes, MD  omeprazole-sodium bicarbonate (ZEGERID) 40-1100 MG per capsule Take 1 capsule by mouth 2 (two) times daily. 08/10/14   Mosie Lukes, MD  predniSONE (DELTASONE) 10 MG tablet Take 0.5 tablets (5 mg total) by mouth daily. 04/22/14   Rigoberto Noel, MD   BP 102/55 mmHg  Pulse  118  Temp(Src) 99.6 F (37.6 C) (Oral)  Resp 27  SpO2 93% Physical Exam  Constitutional: She is oriented to person, place, and time. She appears well-developed and well-nourished. She appears distressed (mildly).  HENT:  Head: Normocephalic and atraumatic.  Mouth/Throat: Oropharynx is clear and moist.  Eyes: Conjunctivae and EOM are normal.  Neck: Normal range of motion. Neck supple.  Cardiovascular: Normal rate, regular rhythm and normal heart sounds.   Pulmonary/Chest: Tachypnea noted. She is in respiratory distress (moderate).  Diminished breath sounds throughout.  Musculoskeletal: Normal range of motion. She exhibits no edema.  Neurological: She is alert and oriented to person, place, and time. No sensory deficit.  Skin: Skin is warm and dry.  Psychiatric: She has a normal mood and affect. Her behavior is normal.  Nursing note and vitals reviewed.   ED Course  Procedures (including critical care time)  CRITICAL CARE Performed by: Lucien Mons   Total critical care time: 35  Critical care time was exclusive of separately billable procedures and treating other patients.  Critical care was necessary to treat or prevent imminent or life-threatening deterioration.  Critical care was time spent personally by me on the following activities: development of treatment plan with patient and/or surrogate as well as nursing, discussions with consultants, evaluation of patient's response to treatment, examination of patient, obtaining history from patient or surrogate, ordering and performing treatments and interventions, ordering and review of laboratory studies, ordering and review of radiographic studies, pulse oximetry and re-evaluation of patient's condition.  Labs Review Labs Reviewed  CBC WITH DIFFERENTIAL - Abnormal; Notable for the following:    WBC 12.5 (*)    RDW 15.9 (*)    Neutrophils Relative % 87 (*)    Neutro Abs 10.8 (*)    Lymphocytes Relative 6 (*)    All other  components within normal limits  COMPREHENSIVE METABOLIC PANEL - Abnormal; Notable for the following:    CO2 35 (*)    Glucose, Bld 129 (*)    Creatinine, Ser 0.43 (*)    AST 52 (*)    All other components within normal limits  I-STAT ARTERIAL BLOOD GAS, ED - Abnormal; Notable for the following:    pH, Arterial 7.338 (*)    pCO2 arterial 70.0 (*)    Bicarbonate 37.1 (*)    Acid-Base Excess 9.0 (*)    All other components within normal limits  CULTURE, BLOOD (ROUTINE X 2)  CULTURE, BLOOD (ROUTINE X 2)  URINE CULTURE  BRAIN NATRIURETIC PEPTIDE  URINALYSIS, ROUTINE W REFLEX MICROSCOPIC  BLOOD GAS, ARTERIAL  I-STAT CG4 LACTIC ACID, ED  I-STAT CG4 LACTIC ACID, ED    Imaging Review Dg Chest Port 1 View  11/13/2014   CLINICAL DATA:  Patient c/o increased shortness of breath that started this morning. Hx of copd and htn. Former smoker.  EXAM: PORTABLE CHEST - 1 VIEW  COMPARISON:  08/16/2014.  FINDINGS: Borderline enlarged cardiac silhouette with a mild increase in size. Increased prominence of the pulmonary vasculature and interstitial markings. The lungs remain hyperexpanded. No pleural fluid. Mild scoliosis.  IMPRESSION: Interval borderline cardiomegaly and mild changes of congestive heart failure superimposed on COPD.   Electronically Signed   By: Enrique Sack M.D.   On: 11/13/2014 15:04     EKG Interpretation   Date/Time:  Saturday November 13 2014 14:39:45 EST Ventricular Rate:  123 PR Interval:  148 QRS Duration: 70 QT Interval:  294 QTC Calculation: 420 R Axis:   94 Text Interpretation:  Sinus tachycardia Biatrial enlargement Rightward  axis Pulmonary disease pattern Abnormal ECG No significant change since  2005 except rate is faster Confirmed by WARD,  DO, KRISTEN ([id number]) on  11/13/2014 2:43:15 PM      MDM   Final diagnoses:  COPD exacerbation  Shortness of breath  Respiratory distress  SIRS (  systemic inflammatory response syndrome)   Pt presenting in moderate  respiratory distress, febrile, tachycardic. Poor air movement and diminished breath sounds throughout. Initial concern for COPD exacerbation with overlying pneumonia. Broad spectrum antibiotics cefepime and vanc started. Dx of UTI yesterday. No urinary symptoms. On 4 L O2 when normally on 3L 24/7. Pt given 125 mg IV solumedrol and continuous nebs with improvement of air movement, wheezes noted throughout. Pt appears dry rather than fluid overloaded, no peripheral edema. Maintenance fluids started. Pt admitted for COPD exacerbation. Admission accepted by Dr. Allyson Sabal, [facility].  Discussed with attending Dr. Leonides Schanz who also evaluated patient and agrees with plan of care.   Carman Ching, PA-C 11/13/14 [phone]

## 2014-11-14 ENCOUNTER — Encounter (HOSPITAL_COMMUNITY): Payer: Self-pay | Admitting: Radiology

## 2014-11-14 ENCOUNTER — Inpatient Hospital Stay (HOSPITAL_COMMUNITY): Payer: Medicare PPO

## 2014-11-14 DIAGNOSIS — F418 Other specified anxiety disorders: Secondary | ICD-10-CM

## 2014-11-14 LAB — BLOOD GAS, ARTERIAL
Acid-Base Excess: 4.8 mmol/L — ABNORMAL HIGH (ref 0.0–2.0)
Bicarbonate: 30.1 mEq/L — ABNORMAL HIGH (ref 20.0–24.0)
DRAWN BY: 10552
O2 Content: 2 L/min
O2 Saturation: 96.2 %
PCO2 ART: 56.6 mmHg — AB (ref 35.0–45.0)
PO2 ART: 84.5 mmHg (ref 80.0–100.0)
Patient temperature: 98.6
TCO2: 31.9 mmol/L (ref 0–100)
pH, Arterial: 7.346 — ABNORMAL LOW (ref 7.350–7.450)

## 2014-11-14 LAB — CBC WITH DIFFERENTIAL/PLATELET
BASOS ABS: 0 10*3/uL (ref 0.0–0.1)
Basophils Relative: 0 % (ref 0–1)
Eosinophils Absolute: 0 10*3/uL (ref 0.0–0.7)
Eosinophils Relative: 0 % (ref 0–5)
HEMATOCRIT: 35.9 % — AB (ref 36.0–46.0)
HEMOGLOBIN: 10.8 g/dL — AB (ref 12.0–15.0)
Lymphocytes Relative: 4 % — ABNORMAL LOW (ref 12–46)
Lymphs Abs: 0.3 10*3/uL — ABNORMAL LOW (ref 0.7–4.0)
MCH: 27.1 pg (ref 26.0–34.0)
MCHC: 30.1 g/dL (ref 30.0–36.0)
MCV: 90 fL (ref 78.0–100.0)
MONO ABS: 0.1 10*3/uL (ref 0.1–1.0)
Monocytes Relative: 2 % — ABNORMAL LOW (ref 3–12)
NEUTROS ABS: 8.3 10*3/uL — AB (ref 1.7–7.7)
Neutrophils Relative %: 94 % — ABNORMAL HIGH (ref 43–77)
Platelets: 280 10*3/uL (ref 150–400)
RBC: 3.99 MIL/uL (ref 3.87–5.11)
RDW: 16.1 % — ABNORMAL HIGH (ref 11.5–15.5)
WBC: 8.8 10*3/uL (ref 4.0–10.5)

## 2014-11-14 LAB — URINE CULTURE
COLONY COUNT: NO GROWTH
CULTURE: NO GROWTH

## 2014-11-14 LAB — COMPREHENSIVE METABOLIC PANEL
ALT: 31 U/L (ref 0–35)
AST: 30 U/L (ref 0–37)
Albumin: 3 g/dL — ABNORMAL LOW (ref 3.5–5.2)
Alkaline Phosphatase: 58 U/L (ref 39–117)
Anion gap: 5 (ref 5–15)
BUN: 15 mg/dL (ref 6–23)
CHLORIDE: 102 meq/L (ref 96–112)
CO2: 37 mmol/L — ABNORMAL HIGH (ref 19–32)
Calcium: 8.5 mg/dL (ref 8.4–10.5)
Creatinine, Ser: 0.5 mg/dL (ref 0.50–1.10)
GFR calc Af Amer: >90 mL/min (ref >90)
GFR calc non Af Amer: >90 mL/min (ref >90)
Glucose, Bld: 137 mg/dL — ABNORMAL HIGH (ref 70–99)
Potassium: 4.1 mmol/L (ref 3.5–5.1)
SODIUM: 144 mmol/L (ref 135–145)
Total Bilirubin: 0.5 mg/dL (ref 0.3–1.2)
Total Protein: 5.6 g/dL — ABNORMAL LOW (ref 6.0–8.3)

## 2014-11-14 MED ORDER — PREDNISONE 50 MG PO TABS
60.0000 mg | ORAL_TABLET | Freq: Every day | ORAL | Status: DC
  Administered 2014-11-15 – 2014-11-17 (×3): 60 mg via ORAL
  Filled 2014-11-14 (×4): qty 1

## 2014-11-14 MED ORDER — IOHEXOL 350 MG/ML SOLN
75.0000 mL | Freq: Once | INTRAVENOUS | Status: AC | PRN
  Administered 2014-11-14: 75 mL via INTRAVENOUS

## 2014-11-14 MED ORDER — ARFORMOTEROL TARTRATE 15 MCG/2ML IN NEBU
15.0000 ug | INHALATION_SOLUTION | Freq: Two times a day (BID) | RESPIRATORY_TRACT | Status: DC
  Administered 2014-11-14 – 2014-11-17 (×7): 15 ug via RESPIRATORY_TRACT
  Filled 2014-11-14 (×8): qty 2

## 2014-11-14 MED ORDER — CETYLPYRIDINIUM CHLORIDE 0.05 % MT LIQD
7.0000 mL | Freq: Two times a day (BID) | OROMUCOSAL | Status: DC
  Administered 2014-11-14 – 2014-11-17 (×7): 7 mL via OROMUCOSAL

## 2014-11-14 NOTE — Progress Notes (Signed)
[facility] Progress Note   [name] UYQ:[id number] DOB: October 01, 1941 DOA: 11/13/2014 PCP: Penni Homans, MD  Brief narrative: [name] is a 74 y.o. female with past medical history of hypertension, hyperlipidemia, GERD, COPD on 3 L oxygen at home, depression, anxiety, who presents with shortness of breath.   Subjective: Breathing is improved but still very dyspneic when moving around in the room.   Assessment/Plan: Principal Problem:   COPD exacerbation- severe COPD on home O2-  Chronic respiratory failure - CT chest reveals only emphysema - significantly improved- cont steroids but wean- cont nebs - will need to ensure we have weaned O2 down as much as possible as she is a CO2 retainer- she uses 3 L of O2 at home and looking the ABG today which was done on 2 L of O2, she will likely need only 1-2 l to maintain adequate O2 saturations without suppressing her hypoxic drive.   Active Problems:    Depression with anxiety Cont celexa    GERD -cont Zegrid    Anemia    Fever on admission  - fever of 101 on admission - UA positive - took Nitrofurantoin x 2 doses as outpt- now on Rocephin - f/u cultures  - questionable if fever was due to drug reaction as it occurred after second dose - question if fever was related to upper airway infection (viral?)  Code Status: full code Family Communication:  Disposition Plan: home when stable DVT prophylaxis: heparin  Consultants:   Procedures:   Antibiotics: Anti-infectives    Start     Dose/Rate Route Frequency Ordered Stop   11/14/14 1600  vancomycin (VANCOCIN) IVPB 1000 mg/200 mL premix  Status:  Discontinued     1,000 mg200 mL/hr over 60 Minutes Intravenous Every 24 hours 11/13/14 1525 11/13/14 2105   11/14/14 1500  ceFEPIme (MAXIPIME) 2 g in dextrose 5 % 50 mL IVPB  Status:  Discontinued     2 g100 mL/hr over 30 Minutes Intravenous Every 24 hours 11/13/14 1525 11/13/14 2105   11/13/14 2200  levofloxacin  (LEVAQUIN) IVPB 750 mg     750 mg100 mL/hr over 90 Minutes Intravenous Every 48 hours 11/13/14 2111     11/13/14 2115  levofloxacin (LEVAQUIN) IVPB 500 mg  Status:  Discontinued     500 mg100 mL/hr over 60 Minutes Intravenous Every 24 hours 11/13/14 2105 11/13/14 2111   11/13/14 1501  ceFEPIme (MAXIPIME) 2 G injection    Comments:  Benton, Joss   : cabinet override      11/13/14 1501 11/13/14 1522   11/13/14 1500  ceFEPIme (MAXIPIME) 2 g in dextrose 5 % 50 mL IVPB     2 g100 mL/hr over 30 Minutes Intravenous  Once 11/13/14 1447 11/13/14 1554   11/13/14 1500  vancomycin (VANCOCIN) IVPB 1000 mg/200 mL premix     1,000 mg200 mL/hr over 60 Minutes Intravenous  Once 11/13/14 1447 11/13/14 1705         Objective: Filed Weights   11/13/14 1913  Weight: 44.9 kg (98 lb 15.8 oz)    Intake/Output Summary (Last 24 hours) at 11/14/14 1248 Last data filed at 11/14/14 1000  Gross per 24 hour  Intake    590 ml  Output    900 ml  Net   -310 ml     Vitals Filed Vitals:   11/13/14 2203 11/13/14 2340 11/14/14 0347 11/14/14 0549  BP:    116/56  Pulse:    85  Temp:  97.6 F (36.4 C)  TempSrc:    Oral  Resp:    17  Height:      Weight:      SpO2: 97% 98% 97% 99%    Exam: General: AAO x 3,  No acute respiratory distress Lungs: Clear to auscultation bilaterally without wheezes or crackles- very poor air entry Cardiovascular: Regular rate and rhythm without murmur gallop or rub normal S1 and S2 Abdomen: Nontender, nondistended, soft, bowel sounds positive, no rebound, no ascites, no appreciable mass Extremities: No significant cyanosis, clubbing, or edema bilateral lower extremities  Data Reviewed: Basic Metabolic Panel:  Recent Labs Lab 11/12/14 1513 11/13/14 1445 11/14/14 0545  NA 144 138 144  K 3.6 3.9 4.1  CL 99 96 102  CO2 35* 35* 37*  GLUCOSE 118* 129* 137*  BUN 24* 22 15  CREATININE 0.58 0.43* 0.50  CALCIUM 8.4 8.8 8.5   Liver Function Tests:  Recent Labs Lab  11/12/14 1513 11/13/14 1445 11/14/14 0545  AST 20 52* 30  ALT 14 30 31   ALKPHOS 61 79 58  BILITOT 0.2 0.3 0.5  PROT 6.1 7.0 5.6*  ALBUMIN 3.7 4.1 3.0*   No results for input(s): LIPASE, AMYLASE in the last 168 hours. No results for input(s): AMMONIA in the last 168 hours. CBC:  Recent Labs Lab 11/12/14 1513 11/13/14 1445 11/14/14 0545  WBC 10.6* 12.5* 8.8  NEUTROABS  --  10.8* 8.3*  HGB 12.3 12.6 10.8*  HCT 39.5 41.3 35.9*  MCV 87.2 91.0 90.0  PLT 352 299 280   Cardiac Enzymes: No results for input(s): CKTOTAL, CKMB, CKMBINDEX, TROPONINI in the last 168 hours. BNP (last 3 results) No results for input(s): PROBNP in the last 8760 hours. CBG: No results for input(s): GLUCAP in the last 168 hours.  Recent Results (from the past 240 hour(s))  Urine Culture     Status: None (Preliminary result)   Collection Time: 11/12/14  4:24 PM  Result Value Ref Range Status   Colony Count >=100,000 COLONIES/ML  Preliminary   Preliminary Report ESCHERICHIA COLI  Preliminary  Blood Culture (routine x 2)     Status: None (Preliminary result)   Collection Time: 11/13/14  2:45 PM  Result Value Ref Range Status   Specimen Description BLOOD RIGHT FOREARM  Final   Special Requests BOTTLES DRAWN AEROBIC AND ANAEROBIC 5ML  Final   Culture   Final           BLOOD CULTURE RECEIVED NO GROWTH TO DATE CULTURE WILL BE HELD FOR 5 DAYS BEFORE ISSUING A FINAL NEGATIVE REPORT Performed at [facility]    Report Status PENDING  Incomplete  Blood Culture (routine x 2)     Status: None (Preliminary result)   Collection Time: 11/13/14  2:50 PM  Result Value Ref Range Status   Specimen Description BLOOD RIGHT ANTECUBITAL  Final   Special Requests BOTTLES DRAWN AEROBIC AND ANAEROBIC 5CC  Final   Culture   Final           BLOOD CULTURE RECEIVED NO GROWTH TO DATE CULTURE WILL BE HELD FOR 5 DAYS BEFORE ISSUING A FINAL NEGATIVE REPORT Performed at [facility]    Report Status PENDING   Incomplete     Studies:  Recent x-ray studies have been reviewed in detail by the Attending Physician  Scheduled Meds:  Scheduled Meds: . albuterol  5 mg Nebulization Once  . antiseptic oral rinse  7 mL Mouth Rinse BID  . arformoterol  15 mcg Nebulization BID  . budesonide  0.25 mg Nebulization BID  . calcium-vitamin D  1 tablet Oral BID  . citalopram  20 mg Oral Daily  . folic acid  1 mg Oral Daily   And  . ferrous fumarate  1 tablet Oral Daily  . heparin  5,000 Units Subcutaneous 3 times per day  . ipratropium-albuterol  3 mL Nebulization Q4H  . levofloxacin (LEVAQUIN) IV  750 mg Intravenous Q48H  . loratadine  10 mg Oral Daily  . mirtazapine  15 mg Oral QHS  . pantoprazole  40 mg Oral Daily  . [START ON 11/15/2014] predniSONE  60 mg Oral Q breakfast   Continuous Infusions:   Time spent on care of this patient: 35 min   [place], MD 11/14/2014, 12:48 PM  LOS: 1 day   [facility] Office  [phone] Pager - Text Page per [email or web address]  If 7PM-7AM, please contact night-coverage [email or web address]

## 2014-11-14 NOTE — Progress Notes (Signed)
Patient requested her pulmonologist Dr. Elsworth Soho to consult on her this admission. Dr. Wynelle Cleveland notified.

## 2014-11-14 NOTE — Progress Notes (Signed)
UR Completed.  [phone]  

## 2014-11-14 NOTE — Evaluation (Signed)
Physical Therapy Evaluation Patient Details Name: [name] MRN: [id number] DOB: [birth year] Today's Date: 11/14/2014   History of Present Illness  HPI: [name] is a 74 y.o. female with past medical history of hypertension, hyperlipidemia, GERD, COPD on 3 L oxygen at home, depression, anxiety, who presents with shortness of breath. Had started treatment for UTI recently as well; diagnosed with COPD exaccerbation  Past Medical History  Diagnosis Date  . COPD (chronic obstructive pulmonary disease)   . Thyroid disease   . Glucose intolerance (impaired glucose tolerance)   . History of small bowel obstruction   . Arthralgia   . Dyspnea   . Pallor   . Chest pain   . Osteoporosis   . Dysuria   . Weight loss   . Edema   . Weakness   . Anxiety   . Depression   . Migraine   . Hypertension   . Leukocytosis   . GERD (gastroesophageal reflux disease)   . Atrophic vaginitis 12/07/2012  . Hx of adenomatous colonic polyps   . Ischemic colitis   . Nocturia 09/02/2013  . Diarrhea 09/02/2013  . Hyperglycemia 12/01/2013   Past Surgical History  Procedure Laterality Date  . Cholecystectomy    . Abdominal hysterectomy  1993  . Tubal ligation    . Bladder repair    . Dilation and curettage of uterus      40 yrs ago  . Eye surgery  04/2011    bil. eyes  . Colonoscopy with propofol N/A 06/08/2013    Procedure: COLONOSCOPY WITH PROPOFOL;  Surgeon: Lafayette Dragon, MD;  Location: [facility];  Service: Endoscopy;  Laterality: N/A;     Clinical Impression  Pt admitted with above diagnosis. Pt currently with functional limitations due to the deficits listed below (see PT Problem List).  Pt will benefit from skilled PT to increase their independence and safety with mobility to allow discharge to the venue listed below.       Follow Up Recommendations Home health PT;Other (comment) [facility] for chronic disease management)    Equipment Recommendations  3in1 (PT) (pt to consider)     Recommendations for Other Services OT consult (for energy conservation education/ADLs)     Precautions / Restrictions Precautions Precaution Comments: Monitor O2 sats Restrictions Weight Bearing Restrictions: No      Mobility  Bed Mobility Overal bed mobility: Modified Independent             General bed mobility comments: used bed rails  Transfers Overall transfer level: Needs assistance Equipment used: None Transfers: Sit to/from Stand Sit to Stand: Supervision         General transfer comment: Supervision for safety; cues for hand placement; noted heavy dependence on pulling up on grab bar with getting up from commode  Ambulation/Gait Ambulation/Gait assistance: Min guard Ambulation Distance (Feet): 20 Feet (20 feet total; to/from bathroom; seated rest on toilet in ba) Assistive device: None (though noted more furniture walking with fatigue) Gait Pattern/deviations: Decreased stride length     General Gait Details: Overall steady intitially, then noted tends to reach out for UE support when fatigued  Stairs            Wheelchair Mobility    Modified Rankin (Stroke Patients Only)       Balance  Pertinent Vitals/Pain Pain Assessment: No/denies pain    Home Living Family/patient expects to be discharged to:: Private residence Living Arrangements: Spouse/significant other Available Help at Discharge: Family;Available PRN/intermittently (husband is on supplemental O2 as well) Type of Home: House Home Access: Stairs to enter   [employer] of Steps: 1 Home Layout: One level Home Equipment: Walker - 4 wheels (and HOme O2)      Prior Function Level of Independence: Independent;Independent with assistive device(s)         Comments: uses rollator prn, mostly for carrying O2 while out     Hand Dominance        Extremity/Trunk Assessment   Upper Extremity  Assessment: Overall WFL for tasks assessed (though noted bilateral tremor with fatigue)           Lower Extremity Assessment: Generalized weakness      Cervical / Trunk Assessment: Normal  Communication   Communication: No difficulties;Other (comment) (except SOB while talking)  Cognition Arousal/Alertness: Awake/alert Behavior During Therapy: WFL for tasks assessed/performed Overall Cognitive Status: Within Functional Limits for tasks assessed                      General Comments General comments (skin integrity, edema, etc.): Session conducted on 3 L O2; noted O2 sats dropped to 86% observed lowest, and HR incr to 129; RN notified    Exercises        Assessment/Plan    PT Assessment Patient needs continued PT services  PT Diagnosis Difficulty walking;Generalized weakness (decr functional capacity)   PT Problem List Decreased strength;Decreased activity tolerance;Decreased balance;Decreased mobility;Decreased knowledge of use of DME;Decreased knowledge of precautions;Cardiopulmonary status limiting activity  PT Treatment Interventions DME instruction;Gait training;Stair training;Functional mobility training;Therapeutic activities;Therapeutic exercise;Patient/family education   PT Goals (Current goals can be found in the Care Plan section) Acute Rehab PT Goals Patient Stated Goal: return to going out PT Goal Formulation: With patient Time For Goal Achievement: 11/28/14 Potential to Achieve Goals: Fair    Frequency Min 3X/week   Barriers to discharge        Co-evaluation               End of Session Equipment Utilized During Treatment: Oxygen Activity Tolerance: Patient limited by fatigue Patient left: in chair;with call bell/phone within reach Nurse Communication: Mobility status;Other (comment) (and noted vitals)         Time: 6213-0865 PT Time Calculation (min) (ACUTE ONLY): 33 min   Charges:   PT Evaluation $Initial PT Evaluation Tier  I: 1 Procedure PT Treatments $Gait Training: 8-22 mins $Therapeutic Activity: 8-22 mins   PT G Codes:        Quin Hoop 11/14/2014, 10:31 AM  Roney Marion, PT  Acute Rehabilitation Services Pager [phone] Office [phone]

## 2014-11-14 NOTE — Progress Notes (Signed)
Called to CT again for follow -up .

## 2014-11-15 DIAGNOSIS — N390 Urinary tract infection, site not specified: Secondary | ICD-10-CM

## 2014-11-15 DIAGNOSIS — J9612 Chronic respiratory failure with hypercapnia: Secondary | ICD-10-CM

## 2014-11-15 DIAGNOSIS — J441 Chronic obstructive pulmonary disease with (acute) exacerbation: Principal | ICD-10-CM

## 2014-11-15 LAB — URINE CULTURE

## 2014-11-15 LAB — LEGIONELLA ANTIGEN, URINE

## 2014-11-15 LAB — INFLUENZA PANEL BY PCR (TYPE A & B)
H1N1 flu by pcr: NOT DETECTED
Influenza A By PCR: NEGATIVE
Influenza B By PCR: NEGATIVE

## 2014-11-15 MED ORDER — DIPHENHYDRAMINE HCL 25 MG PO CAPS
25.0000 mg | ORAL_CAPSULE | Freq: Four times a day (QID) | ORAL | Status: DC | PRN
  Administered 2014-11-15 – 2014-11-17 (×3): 25 mg via ORAL
  Filled 2014-11-15 (×3): qty 1

## 2014-11-15 MED ORDER — IPRATROPIUM-ALBUTEROL 0.5-2.5 (3) MG/3ML IN SOLN
3.0000 mL | RESPIRATORY_TRACT | Status: DC
  Administered 2014-11-15: 3 mL via RESPIRATORY_TRACT
  Filled 2014-11-15: qty 3

## 2014-11-15 MED ORDER — IPRATROPIUM-ALBUTEROL 0.5-2.5 (3) MG/3ML IN SOLN
3.0000 mL | RESPIRATORY_TRACT | Status: DC
Start: 2014-11-15 — End: 2014-11-17
  Administered 2014-11-15 – 2014-11-17 (×10): 3 mL via RESPIRATORY_TRACT
  Filled 2014-11-15 (×12): qty 3

## 2014-11-15 MED ORDER — AMOXICILLIN-POT CLAVULANATE 875-125 MG PO TABS
1.0000 | ORAL_TABLET | Freq: Two times a day (BID) | ORAL | Status: DC
  Administered 2014-11-15 – 2014-11-17 (×4): 1 via ORAL
  Filled 2014-11-15 (×6): qty 1

## 2014-11-15 MED ORDER — SALINE SPRAY 0.65 % NA SOLN
1.0000 | NASAL | Status: DC | PRN
  Filled 2014-11-15: qty 44

## 2014-11-15 MED ORDER — FLUTICASONE PROPIONATE 50 MCG/ACT NA SUSP
2.0000 | Freq: Two times a day (BID) | NASAL | Status: DC
  Administered 2014-11-15 – 2014-11-17 (×5): 2 via NASAL
  Filled 2014-11-15: qty 16

## 2014-11-15 NOTE — Consult Note (Signed)
Name: [name] MRN: [id number] DOB: 1941/03/20    ADMISSION DATE:  11/13/2014 CONSULTATION DATE:  1/18  REFERRING MD :  Wynelle Cleveland   CHIEF COMPLAINT:  AECOPD   BRIEF PATIENT DESCRIPTION:  74 year old female f/b RA w/ known h/o GOLD D COPD; and chronic resp failure on that basis (O2 3 liters/Pred 5mg /d). Admitted 1/16 w/ working dx of AECOPD (possibly exacerbated by allergic reaction). PCCM has been asked to assist w. Care as of 1/18  SIGNIFICANT EVENTS    STUDIES:  CT chest    HISTORY OF PRESENT ILLNESS:   74 year old female f/b RA w/ known h/o GOLD D COPD; and chronic resp failure on that basis (O2 3 liters/Pred 5mg /d). Went to PCP on 1/16 w/ CC: dysuria sent home w/ working dx of UTI and treated w/ Nitrofurantoin. She stated the following day began to note increased fever, nausea and headache. Temp was >101. Began to develop increased dyspnea w/ poor air movement and increased WOB so presented to ER. Treated w/ working dx of AECOPD. Pulm asked to see to assist w/ treating her pulmonary disease at the pt's request.   PAST MEDICAL HISTORY :   has a past medical history of COPD (chronic obstructive pulmonary disease); Thyroid disease; Glucose intolerance (impaired glucose tolerance); History of small bowel obstruction; Arthralgia; Dyspnea; Pallor; Chest pain; Osteoporosis; Dysuria; Weight loss; Edema; Weakness; Anxiety; Depression; Migraine; Hypertension; Leukocytosis; GERD (gastroesophageal reflux disease); Atrophic vaginitis (12/07/2012); adenomatous colonic polyps; Ischemic colitis; Nocturia (09/02/2013); Diarrhea (09/02/2013); and Hyperglycemia (12/01/2013).  has past surgical history that includes Cholecystectomy; Abdominal hysterectomy (1993); Tubal ligation; Bladder repair; Dilation and curettage of uterus; Eye surgery (04/2011); and Colonoscopy with propofol (N/A, 06/08/2013). Prior to Admission medications   Medication Sig Start Date End Date Taking? Authorizing Provider    albuterol (PROVENTIL HFA;VENTOLIN HFA) 108 (90 BASE) MCG/ACT inhaler Inhale 2 puffs into the lungs every 6 (six) hours as needed for wheezing or shortness of breath. 08/05/14  Yes Rigoberto Noel, MD  albuterol (PROVENTIL) (2.5 MG/3ML) 0.083% nebulizer solution Take 3 mLs (2.5 mg total) by nebulization every 4 (four) hours as needed for wheezing or shortness of breath. Dx 496 04/22/14  Yes Rigoberto Noel, MD  ALPRAZolam Duanne Moron) 0.5 MG tablet 1 tab po bid and 2 tabs po qhs prn anxiety, insomnia Patient taking differently: Take 0.5-1.25 mg by mouth 3 (three) times daily. Takes 1 tablet twice daily, then takes 1.5 tablets at bedtime 08/10/14  Yes Mosie Lukes, MD  arformoterol (BROVANA) 15 MCG/2ML NEBU Take 2 mLs (15 mcg total) by nebulization 2 (two) times daily. Dx 496 04/22/14  Yes Rigoberto Noel, MD  Ascorbic Acid (VITAMIN C ER PO) Take 1 Package by mouth daily.    Yes Historical Provider, MD  budesonide (PULMICORT) 0.25 MG/2ML nebulizer solution Take 2 mLs (0.25 mg total) by nebulization 2 (two) times daily. Dx: 628 04/22/14  Yes Rigoberto Noel, MD  Calcium Carbonate-Vitamin D 600-400 MG-UNIT per tablet Take 1 tablet by mouth 2 (two) times daily.     Yes Historical Provider, MD  citalopram (CELEXA) 20 MG tablet Take 1 tablet (20 mg total) by mouth daily. Patient taking differently: Take 10 mg by mouth daily.  08/10/14  Yes Mosie Lukes, MD  Ferrous Fumarate-Folic Acid (HEMOCYTE-F) 324-1 MG TABS Take 1 tablet by mouth daily. 11/12/14  Yes Mosie Lukes, MD  fexofenadine (ALLEGRA) 180 MG tablet Take 180 mg by mouth daily as needed (for allergies).  Yes Historical Provider, MD  Glucosamine-Chondroitin (GLUCOSAMINE CHONDR COMPLEX PO) Take 1 tablet by mouth 2 (two) times daily.    Yes Historical Provider, MD  mirtazapine (REMERON) 15 MG tablet Take 1 tablet (15 mg total) by mouth at bedtime. 08/10/14  Yes Mosie Lukes, MD  nitrofurantoin, macrocrystal-monohydrate, (MACROBID) 100 MG capsule Take 1 capsule  (100 mg total) by mouth 2 (two) times daily. 11/12/14  Yes Mosie Lukes, MD  omeprazole-sodium bicarbonate (ZEGERID) 40-1100 MG per capsule Take 1 capsule by mouth 2 (two) times daily. 08/10/14  Yes Mosie Lukes, MD  predniSONE (DELTASONE) 10 MG tablet Take 0.5 tablets (5 mg total) by mouth daily. 04/22/14  Yes Rigoberto Noel, MD   Allergies  Allergen Reactions  . Morphine Other (See Comments)    REACTION: nightmares  . Ambien [Zolpidem] Anxiety  . Cymbalta [Duloxetine Hcl] Anxiety    FAMILY HISTORY:  family history includes Alcohol abuse in her brother, father, and mother; Anxiety disorder in her sister; COPD in her brother, brother, brother, mother, paternal grandfather, sister, and sister; Colon cancer in her paternal aunt; Hearing loss in her brother and brother; Heart disease in her mother; Hypertension in her mother and sister; Kidney disease in her mother; Osteoporosis in her mother; Prostate cancer in an other family member; Stroke in her mother; Thyroid disease in her daughter. SOCIAL HISTORY:  reports that she quit smoking about 13 years ago. Her smoking use included Cigarettes. She has a 80 pack-year smoking history. She has never used smokeless tobacco. She reports that she does not drink alcohol or use illicit drugs.  REVIEW OF SYSTEMS (bolds are positive):   Constitutional:  fever, chills, weight loss, malaise/fatigue and diaphoresis.  HENT: Negative for hearing loss, ear pain, nosebleeds, congestion, sore throat, neck pain, tinnitus and ear discharge.   Eyes: Negative for blurred vision, double vision, photophobia, pain, discharge and redness.  Respiratory: Negative for cough, hemoptysis, sputum production, shortness of breath, wheezing and stridor.   Cardiovascular: Negative for chest pain, palpitations, orthopnea, claudication, leg swelling and PND.  Gastrointestinal: Negative for heartburn, nausea, vomiting, abdominal pain, diarrhea, constipation, blood in stool and melena.    Genitourinary: Negative for dysuria, urgency, frequency, hematuria and flank pain.  Musculoskeletal: Negative for myalgias, back pain, joint pain and falls.  Skin: Negative for itching and rash.  Neurological: Negative for dizziness, tingling, tremors, sensory change, speech change, focal weakness, seizures, loss of consciousness, weakness and headaches.  Endo/Heme/Allergies: Negative for environmental allergies and polydipsia. Does not bruise/bleed easily.  SUBJECTIVE:  Slowly feeling better   VITAL SIGNS: Temp:  [98 F (36.7 C)-98.4 F (36.9 C)] 98 F (36.7 C) (01/18 0542) Pulse Rate:  [85-103] 87 (01/18 0840) Resp:  [16-18] 16 (01/18 0840) BP: (119-135)/(52-81) 135/81 mmHg (01/18 0542) SpO2:  [96 %-98 %] 98 % (01/18 0840) FiO2 (%):  [32 %] 32 % (01/17 2051)  PHYSICAL EXAMINATION: General:  74 year old female, sitting up at side of bed. Not in distress.  Neuro:  Awake, no focal def  HEENT:  Voice hoarse. No JVD  Cardiovascular:  rrr Lungs:  Decreased t/o Abdomen:  Soft, non-tender  Musculoskeletal:  Intact  Skin:  Dry, improving raised rash on thorax    Recent Labs Lab 11/12/14 1513 11/13/14 1445 11/14/14 0545  NA 144 138 144  K 3.6 3.9 4.1  CL 99 96 102  CO2 35* 35* 37*  BUN 24* 22 15  CREATININE 0.58 0.43* 0.50  GLUCOSE 118* 129* 137*  Recent Labs Lab 11/12/14 1513 11/13/14 1445 11/14/14 0545  HGB 12.3 12.6 10.8*  HCT 39.5 41.3 35.9*  WBC 10.6* 12.5* 8.8  PLT 352 299 280   Ct Angio Chest Pe W/cm &/or Wo Cm  11/14/2014   CLINICAL DATA:  Acute onset of shortness of breath and mild wheezing. Elevated D-dimer. Initial encounter.  EXAM: CT ANGIOGRAPHY CHEST WITH CONTRAST  TECHNIQUE: Multidetector CT imaging of the chest was performed using the standard protocol during bolus administration of intravenous contrast. Multiplanar CT image reconstructions and MIPs were obtained to evaluate the vascular anatomy.  CONTRAST:  46mL OMNIPAQUE IOHEXOL 350 MG/ML SOLN   COMPARISON:  Chest radiograph performed 11/13/2014  FINDINGS: There is no evidence of pulmonary embolus.  Bilateral emphysematous change is noted. Minimal peripheral atelectasis or scarring is seen. There is no evidence of significant focal consolidation, pleural effusion or pneumothorax. No masses are identified; no abnormal focal contrast enhancement is seen.  The mediastinum is unremarkable in appearance. No mediastinal lymphadenopathy is seen. The great vessels are grossly unremarkable. Incidental note is made of a direct origin of the left vertebral artery from the aortic arch. No axillary lymphadenopathy is seen. The visualized portions of the thyroid gland are unremarkable in appearance.  The visualized portions of the liver and spleen are unremarkable. The patient is status post cholecystectomy, with clips noted along the gallbladder fossa. The visualized portions of the pancreas, adrenal glands and kidneys are within normal limits.  No acute osseous abnormalities are seen.  Review of the MIP images confirms the above findings.  IMPRESSION: 1. No evidence of pulmonary embolus. 2. Bilateral emphysematous change noted. Minimal peripheral atelectasis or scarring seen. Lungs otherwise clear.   Electronically Signed   By: Garald Balding M.D.   On: 11/14/2014 02:27   Dg Chest Port 1 View  11/13/2014   CLINICAL DATA:  Patient c/o increased shortness of breath that started this morning. Hx of copd and htn. Former smoker.  EXAM: PORTABLE CHEST - 1 VIEW  COMPARISON:  08/16/2014.  FINDINGS: Borderline enlarged cardiac silhouette with a mild increase in size. Increased prominence of the pulmonary vasculature and interstitial markings. The lungs remain hyperexpanded. No pleural fluid. Mild scoliosis.  IMPRESSION: Interval borderline cardiomegaly and mild changes of congestive heart failure superimposed on COPD.   Electronically Signed   By: Enrique Sack M.D.   On: 11/13/2014 15:04    ASSESSMENT / PLAN:  Acute on  Chronic respiratory failure AECOPD super imposed on GOLD D COPD: unclear if this was exacerbated by a viral URI or possibly allergic reaction to Nitrofurantoin.  Sinusitis w/ PND GERD  Recent UTI  Rash  Urticaria   Discussion  She certainly could have been exposed to viral illness during her office visit w/ her PCP. Also hard to ignore the possibility of drug reaction given rash and urticaria.  She seems to be improving. Would not worry about pushing O2 down to 1-2 liters. She has been fixed at 3 liters for some time now and this affords her the most activity tolerance.   Plan/rec Cont supplemental O2 at 3 liters Cont scheduled Brovana and Pulmicort Add nasal Hygiene regimen Steroid taper  Complete 5--7d abx for UTI +/- AECOPD Cont PPI Add Nitrofurantoin to allergy list.  F/u on [place] ACNP-BC [place] Pager # [phone] OR # [phone] if no answer   Attending note-her symptoms started acutely with a rash and chills, rash has subsided now. Nitrofurantoin seems to have  been the only new medication added -I agree that this should be added to her allergy list. Of note, urine culture from 1/15 showed Escherichia coli resistant to levofloxacin-I have changed her antibiotics to Augmentin-5 days should suffice. Again I would not worry about getting her oxygen levels down, she does need 3 L of oxygen during exertion-as has been evidenced during numerous office visits.  We will be available as needed, flu precautions can be discontinued  ALVA,RAKESH V. MD [phone]  11/15/2014, 10:55 AM

## 2014-11-15 NOTE — Progress Notes (Signed)
Occupational Therapy Evaluation Patient Details Name: ODIE RAUEN MRN: [id number] DOB: [birth year] Today's Date: 11/15/2014    History of Present Illness HPI: KALICIA DUFRESNE is a 74 y.o. female with past medical history of hypertension, hyperlipidemia, GERD, COPD on 3 L oxygen at home, depression, anxiety, who presents with shortness of breath. Had started treatment for UTI recently as well; diagnosed with COPD exaccerbation   Clinical Impression   PTA, pt lived at home with husband and was mod I with ADL and mobility. Pt is moving well but states that ADL tasks are very difficult due to her COPD. Began educating pt on E conservation techniques. Will plan to see pt again tomorrow to further educate on energy conservation and activity modification.     Follow Up Recommendations  No OT follow up;Supervision - Intermittent    Equipment Recommendations  None recommended by OT    Recommendations for Other Services       Precautions / Restrictions Precautions Precaution Comments: Monitor O2 sats      Mobility Bed Mobility Overal bed mobility: Modified Independent             General bed mobility comments: used bed rails  Transfers Overall transfer level: Modified independent Equipment used: None                  Balance Overall balance assessment: No apparent balance deficits (not formally assessed)                                          ADL Overall ADL's : Needs assistance/impaired     Grooming: Set up   Upper Body Bathing: Set up   Lower Body Bathing: Set up   Upper Body Dressing : Set up   Lower Body Dressing: Set up   Toilet Transfer: Modified Independent   Toileting- Clothing Manipulation and Hygiene: Modified independent       Functional mobility during ADLs: Modified independent General ADL Comments: Pt moving wel                                     Pertinent Vitals/Pain Pain Assessment: No/denies  pain     Hand Dominance     Extremity/Trunk Assessment Upper Extremity Assessment Upper Extremity Assessment: Overall WFL for tasks assessed   Lower Extremity Assessment Lower Extremity Assessment: Defer to PT evaluation   Cervical / Trunk Assessment Cervical / Trunk Assessment: Normal   Communication Communication Communication: No difficulties;Other (comment) (except SOB while talking)   Cognition Arousal/Alertness: Awake/alert Behavior During Therapy: WFL for tasks assessed/performed Overall Cognitive Status: Within Functional Limits for tasks assessed                     General Comments       Exercises Exercises:  (encouraged OOB)     Shoulder Instructions      Home Living Family/patient expects to be discharged to:: Private residence Living Arrangements: Spouse/significant other Available Help at Discharge: Family;Available PRN/intermittently (husband is on supplemental O2 as well) Type of Home: House Home Access: Stairs to enter [employer] of Steps: 1   Home Layout: One level     Bathroom Shower/Tub: Tub/shower unit Shower/tub characteristics: Architectural technologist: Standard Bathroom Accessibility: Yes How Accessible: Accessible via walker Home Equipment: [place] - 4 wheels;Shower  seat (and HOme O2)          Prior Functioning/Environment Level of Independence: Independent;Independent with assistive device(s)        Comments: uses rollator prn, mostly for carrying O2 while out    OT Diagnosis: Generalized weakness   OT Problem List: Decreased activity tolerance;Decreased knowledge of use of DME or AE;Cardiopulmonary status limiting activity   OT Treatment/Interventions: Self-care/ADL training;Energy conservation;DME and/or AE instruction;Patient/family education    OT Goals(Current goals can be found in the care plan section) Acute Rehab OT Goals Patient Stated Goal: return to going out OT Goal Formulation: With  patient Time For Goal Achievement: 11/29/14 Potential to Achieve Goals: Good  OT Frequency: Min 2X/week   Barriers to D/C:            Co-evaluation              End of Session Equipment Utilized During Treatment: Oxygen Nurse Communication: Mobility status  Activity Tolerance: Patient limited by fatigue Patient left: in bed;with call bell/phone within reach   Time: 1430-1449 OT Time Calculation (min): 19 min Charges:  OT General Charges $OT Visit: 1 Procedure OT Evaluation $Initial OT Evaluation Tier I: 1 Procedure OT Treatments $Self Care/Home Management : 8-22 mins G-Codes:    Tamari Redwine,HILLARY Dec 05, 2014, 2:58 PM   [facility], OTR/L  [phone] 12-05-14

## 2014-11-15 NOTE — Care Management Note (Unsigned)
    Page 1 of 1   11/15/2014     12:59:28 PM CARE MANAGEMENT NOTE 11/15/2014  Patient:  [name],[name]   Account Number:  [id number]  Date Initiated:  11/15/2014  Documentation initiated by:  Tomi Bamberger  Subjective/Objective Assessment:   dx copd  admit- lives with spouse.     Action/Plan:   pt eval- rec hhpt and 3 n 1   Anticipated DC Date:  11/16/2014   Anticipated DC Plan:  [place]  CM consult      Choice offered to / List presented to:             Status of service:  In process, will continue to follow Medicare Important Message given?  YES (If response is "NO", the following Medicare IM given date fields will be blank) Date Medicare IM given:  11/15/2014 Medicare IM given by:  Tomi Bamberger Date Additional Medicare IM given:   Additional Medicare IM given by:    Discharge Disposition:    Per UR Regulation:  Reviewed for med. necessity/level of care/duration of stay  If discussed at [place] of Stay Meetings, dates discussed:    Comments:

## 2014-11-15 NOTE — Progress Notes (Signed)
[facility] Progress Note   ZARRIAH STARKEL QQV:[id number] DOB: Nov 30, 1940 DOA: 11/13/2014 PCP: Penni Homans, MD  Brief narrative: [name] is a 74 y.o. female with past medical history of hypertension, hyperlipidemia, GERD, COPD on 3 L oxygen at home, depression, anxiety, who presents with shortness of breath.   Subjective: Not feeling much better today. Asked for pulm eval yesterday.   Assessment/Plan: Principal Problem:   COPD exacerbation- severe COPD on home O2-  Chronic respiratory failure - CT chest reveals only emphysema -  improved- started on Prednisone at 60 mg daily- cont nebs - will need to ensure we have weaned O2 down as much as possible as she is a CO2 retainer- she uses 3 L of O2 at home and looking the ABG today which was done on 2 L of O2, she will likely need only 1-2 l to maintain adequate O2 saturations without suppressing her hypoxic drive.  - have called pulmonary based upon patient's request  Active Problems:    Depression with anxiety Cont celexa    GERD -cont Zegrid    Anemia    Fever on admission  - fever of 101 on admission - UA positive - took Nitrofurantoin x 2 doses as outpt- now on Levaquin-urine culture negative- will give a total of 3 days of antibiotics - questionable if fever was due to drug reaction as it occurred after second dose - question if fever was related to upper airway infection (viral?)  Code Status: full code Family Communication:  Disposition Plan: home when stable DVT prophylaxis: heparin  Consultants:   Procedures:   Antibiotics: Anti-infectives    Start     Dose/Rate Route Frequency Ordered Stop   11/14/14 1600  vancomycin (VANCOCIN) IVPB 1000 mg/200 mL premix  Status:  Discontinued     1,000 mg200 mL/hr over 60 Minutes Intravenous Every 24 hours 11/13/14 1525 11/13/14 2105   11/14/14 1500  ceFEPIme (MAXIPIME) 2 g in dextrose 5 % 50 mL IVPB  Status:  Discontinued     2 g100 mL/hr over 30  Minutes Intravenous Every 24 hours 11/13/14 1525 11/13/14 2105   11/13/14 2200  levofloxacin (LEVAQUIN) IVPB 750 mg     750 mg100 mL/hr over 90 Minutes Intravenous Every 48 hours 11/13/14 2111     11/13/14 2115  levofloxacin (LEVAQUIN) IVPB 500 mg  Status:  Discontinued     500 mg100 mL/hr over 60 Minutes Intravenous Every 24 hours 11/13/14 2105 11/13/14 2111   11/13/14 1501  ceFEPIme (MAXIPIME) 2 G injection    Comments:  Benton, Joss   : cabinet override      11/13/14 1501 11/13/14 1522   11/13/14 1500  ceFEPIme (MAXIPIME) 2 g in dextrose 5 % 50 mL IVPB     2 g100 mL/hr over 30 Minutes Intravenous  Once 11/13/14 1447 11/13/14 1554   11/13/14 1500  vancomycin (VANCOCIN) IVPB 1000 mg/200 mL premix     1,000 mg200 mL/hr over 60 Minutes Intravenous  Once 11/13/14 1447 11/13/14 1705         Objective: Filed Weights   11/13/14 1913  Weight: 44.9 kg (98 lb 15.8 oz)    Intake/Output Summary (Last 24 hours) at 11/15/14 1056 Last data filed at 11/15/14 1055  Gross per 24 hour  Intake   1138 ml  Output    300 ml  Net    838 ml     Vitals Filed Vitals:   11/14/14 2051 11/14/14 2132 11/15/14 0542 11/15/14 0840  BP:  127/64 135/81   Pulse:  96 85 87  Temp:  98.4 F (36.9 C) 98 F (36.7 C)   TempSrc:  Oral Oral   Resp:  18 18 16   Height:      Weight:      SpO2: 96% 97% 97% 98%    Exam: General: AAO x 3,  No acute respiratory distress Lungs: Clear to auscultation bilaterally without wheezes or crackles- very poor air entry Cardiovascular: Regular rate and rhythm without murmur gallop or rub normal S1 and S2 Abdomen: Nontender, nondistended, soft, bowel sounds positive, no rebound, no ascites, no appreciable mass Extremities: No significant cyanosis, clubbing, or edema bilateral lower extremities  Data Reviewed: Basic Metabolic Panel:  Recent Labs Lab 11/12/14 1513 11/13/14 1445 11/14/14 0545  NA 144 138 144  K 3.6 3.9 4.1  CL 99 96 102  CO2 35* 35* 37*  GLUCOSE  118* 129* 137*  BUN 24* 22 15  CREATININE 0.58 0.43* 0.50  CALCIUM 8.4 8.8 8.5   Liver Function Tests:  Recent Labs Lab 11/12/14 1513 11/13/14 1445 11/14/14 0545  AST 20 52* 30  ALT 14 30 31   ALKPHOS 61 79 58  BILITOT 0.2 0.3 0.5  PROT 6.1 7.0 5.6*  ALBUMIN 3.7 4.1 3.0*   No results for input(s): LIPASE, AMYLASE in the last 168 hours. No results for input(s): AMMONIA in the last 168 hours. CBC:  Recent Labs Lab 11/12/14 1513 11/13/14 1445 11/14/14 0545  WBC 10.6* 12.5* 8.8  NEUTROABS  --  10.8* 8.3*  HGB 12.3 12.6 10.8*  HCT 39.5 41.3 35.9*  MCV 87.2 91.0 90.0  PLT 352 299 280   Cardiac Enzymes: No results for input(s): CKTOTAL, CKMB, CKMBINDEX, TROPONINI in the last 168 hours. BNP (last 3 results) No results for input(s): PROBNP in the last 8760 hours. CBG: No results for input(s): GLUCAP in the last 168 hours.  Recent Results (from the past 240 hour(s))  Urine Culture     Status: None   Collection Time: 11/12/14  4:24 PM  Result Value Ref Range Status   Culture ESCHERICHIA COLI  Final   Colony Count >=100,000 COLONIES/ML  Final   Organism ID, Bacteria ESCHERICHIA COLI  Final      Susceptibility   Escherichia coli -  (no method available)    AMPICILLIN >=32 Resistant     AMOX/CLAVULANIC 4 Sensitive     AMPICILLIN/SULBACTAM 16 Intermediate     PIP/TAZO <=4 Sensitive     IMIPENEM <=0.25 Sensitive     CEFAZOLIN <=4 Sensitive     CEFTRIAXONE <=1 Sensitive     CEFTAZIDIME <=1 Sensitive     CEFEPIME <=1 Sensitive     GENTAMICIN <=1 Sensitive     TOBRAMYCIN <=1 Sensitive     CIPROFLOXACIN >=4 Resistant     LEVOFLOXACIN >=8 Resistant     NITROFURANTOIN <=16 Sensitive     TRIMETH/SULFA <=20 Sensitive   Blood Culture (routine x 2)     Status: None (Preliminary result)   Collection Time: 11/13/14  2:45 PM  Result Value Ref Range Status   Specimen Description BLOOD RIGHT FOREARM  Final   Special Requests BOTTLES DRAWN AEROBIC AND ANAEROBIC 5ML  Final    Culture   Final           BLOOD CULTURE RECEIVED NO GROWTH TO DATE CULTURE WILL BE HELD FOR 5 DAYS BEFORE ISSUING A FINAL NEGATIVE REPORT Performed at [facility]    Report Status PENDING  Incomplete  Blood Culture (routine x 2)     Status: None (Preliminary result)   Collection Time: 11/13/14  2:50 PM  Result Value Ref Range Status   Specimen Description BLOOD RIGHT ANTECUBITAL  Final   Special Requests BOTTLES DRAWN AEROBIC AND ANAEROBIC 5CC  Final   Culture   Final           BLOOD CULTURE RECEIVED NO GROWTH TO DATE CULTURE WILL BE HELD FOR 5 DAYS BEFORE ISSUING A FINAL NEGATIVE REPORT Performed at [facility]    Report Status PENDING  Incomplete  Urine culture     Status: None   Collection Time: 11/13/14  5:20 PM  Result Value Ref Range Status   Specimen Description URINE, CLEAN CATCH  Final   Special Requests NONE  Final   Colony Count NO GROWTH Performed at [facility]   Final   Culture NO GROWTH Performed at [facility]   Final   Report Status 11/14/2014 FINAL  Final     Studies:  Recent x-ray studies have been reviewed in detail by the Attending Physician  Scheduled Meds:  Scheduled Meds: . albuterol  5 mg Nebulization Once  . antiseptic oral rinse  7 mL Mouth Rinse BID  . arformoterol  15 mcg Nebulization BID  . budesonide  0.25 mg Nebulization BID  . calcium-vitamin D  1 tablet Oral BID  . citalopram  20 mg Oral Daily  . folic acid  1 mg Oral Daily   And  . ferrous fumarate  1 tablet Oral Daily  . heparin  5,000 Units Subcutaneous 3 times per day  . ipratropium-albuterol  3 mL Nebulization Q4H while awake  . levofloxacin (LEVAQUIN) IV  750 mg Intravenous Q48H  . loratadine  10 mg Oral Daily  . mirtazapine  15 mg Oral QHS  . pantoprazole  40 mg Oral Daily  . predniSONE  60 mg Oral Q breakfast   Continuous Infusions:   Time spent on care of this patient: 35 min   Adin, MD 11/15/2014, 10:56 AM  LOS: 2 days    [facility] Office  [phone] Pager - Text Page per [email or web address]  If 7PM-7AM, please contact night-coverage [email or web address]

## 2014-11-16 NOTE — Progress Notes (Signed)
[facility] Progress Note   HASSIE MANDT TZG:[id number] DOB: 11/28/1940 DOA: 11/13/2014 PCP: Penni Homans, MD  Brief narrative: [name] is a 74 y.o. female with past medical history of hypertension, hyperlipidemia, GERD, COPD on 3 L oxygen at home, depression, anxiety, who presents with shortness of breath.   Subjective: Continues to feel very short of breath on exertion.  Assessment/Plan: Principal Problem:   COPD exacerbation- severe COPD on home O2-  Chronic respiratory failure - CT chest reveals only emphysema - consulted pulm due to patient' s request  -  Improving slowly- started on Prednisone at 60 mg daily- will not taper yet as she is still wheezing today- cont nebs, flutter valve, Mucinex - transitioned to Augmentin by pulm- complete for 5 day course  - have asked her to increase her ambulation  Active Problems:  UTI - U cx negative as she had already taked 2 doses of Nitrofurantoin at home    Depression with anxiety Cont celexa    GERD -cont Zegrid    Anemia    Fever on admission  - fever of 101 on admission - UA positive - took Nitrofurantoin x 2 doses as outpt- now on Levaquin-urine culture negative- will give a total of 3 days of antibiotics - questionable if fever was due to drug reaction as it occurred after second dose - question if fever was related to upper airway infection (viral?)  Code Status: full code Family Communication:  Disposition Plan: home when stable DVT prophylaxis: heparin  Consultants: pulmonary  Procedures:   Antibiotics: Anti-infectives    Start     Dose/Rate Route Frequency Ordered Stop   11/15/14 1700  amoxicillin-clavulanate (AUGMENTIN) 875-125 MG per tablet 1 tablet     1 tablet Oral 2 times daily with meals 11/15/14 1537     11/14/14 1600  vancomycin (VANCOCIN) IVPB 1000 mg/200 mL premix  Status:  Discontinued     1,000 mg200 mL/hr over 60 Minutes Intravenous Every 24 hours 11/13/14 1525 11/13/14  2105   11/14/14 1500  ceFEPIme (MAXIPIME) 2 g in dextrose 5 % 50 mL IVPB  Status:  Discontinued     2 g100 mL/hr over 30 Minutes Intravenous Every 24 hours 11/13/14 1525 11/13/14 2105   11/13/14 2200  levofloxacin (LEVAQUIN) IVPB 750 mg  Status:  Discontinued     750 mg100 mL/hr over 90 Minutes Intravenous Every 48 hours 11/13/14 2111 11/15/14 1537   11/13/14 2115  levofloxacin (LEVAQUIN) IVPB 500 mg  Status:  Discontinued     500 mg100 mL/hr over 60 Minutes Intravenous Every 24 hours 11/13/14 2105 11/13/14 2111   11/13/14 1501  ceFEPIme (MAXIPIME) 2 G injection    Comments:  Benton, Joss   : cabinet override      11/13/14 1501 11/13/14 1522   11/13/14 1500  ceFEPIme (MAXIPIME) 2 g in dextrose 5 % 50 mL IVPB     2 g100 mL/hr over 30 Minutes Intravenous  Once 11/13/14 1447 11/13/14 1554   11/13/14 1500  vancomycin (VANCOCIN) IVPB 1000 mg/200 mL premix     1,000 mg200 mL/hr over 60 Minutes Intravenous  Once 11/13/14 1447 11/13/14 1705         Objective: Filed Weights   11/13/14 1913  Weight: 44.9 kg (98 lb 15.8 oz)    Intake/Output Summary (Last 24 hours) at 11/16/14 1229 Last data filed at 11/16/14 1208  Gross per 24 hour  Intake    480 ml  Output   1400 ml  Net   -920 ml     Vitals Filed Vitals:   11/16/14 0525 11/16/14 0816 11/16/14 0822 11/16/14 0828  BP:      Pulse:      Temp:      TempSrc:      Resp:      Height:      Weight:      SpO2: 98% 97% 97% 97%    Exam: General: AAO x 3,  No acute respiratory distress Lungs: Clear to auscultation bilaterally without wheezes or crackles- very poor air entry Cardiovascular: Regular rate and rhythm without murmur gallop or rub normal S1 and S2 Abdomen: Nontender, nondistended, soft, bowel sounds positive, no rebound, no ascites, no appreciable mass Extremities: No significant cyanosis, clubbing, or edema bilateral lower extremities  Data Reviewed: Basic Metabolic Panel:  Recent Labs Lab 11/12/14 1513 11/13/14 1445  11/14/14 0545  NA 144 138 144  K 3.6 3.9 4.1  CL 99 96 102  CO2 35* 35* 37*  GLUCOSE 118* 129* 137*  BUN 24* 22 15  CREATININE 0.58 0.43* 0.50  CALCIUM 8.4 8.8 8.5   Liver Function Tests:  Recent Labs Lab 11/12/14 1513 11/13/14 1445 11/14/14 0545  AST 20 52* 30  ALT 14 30 31   ALKPHOS 61 79 58  BILITOT 0.2 0.3 0.5  PROT 6.1 7.0 5.6*  ALBUMIN 3.7 4.1 3.0*   No results for input(s): LIPASE, AMYLASE in the last 168 hours. No results for input(s): AMMONIA in the last 168 hours. CBC:  Recent Labs Lab 11/12/14 1513 11/13/14 1445 11/14/14 0545  WBC 10.6* 12.5* 8.8  NEUTROABS  --  10.8* 8.3*  HGB 12.3 12.6 10.8*  HCT 39.5 41.3 35.9*  MCV 87.2 91.0 90.0  PLT 352 299 280   Cardiac Enzymes: No results for input(s): CKTOTAL, CKMB, CKMBINDEX, TROPONINI in the last 168 hours. BNP (last 3 results) No results for input(s): PROBNP in the last 8760 hours. CBG: No results for input(s): GLUCAP in the last 168 hours.  Recent Results (from the past 240 hour(s))  Urine Culture     Status: None   Collection Time: 11/12/14  4:24 PM  Result Value Ref Range Status   Culture ESCHERICHIA COLI  Final   Colony Count >=100,000 COLONIES/ML  Final   Organism ID, Bacteria ESCHERICHIA COLI  Final      Susceptibility   Escherichia coli -  (no method available)    AMPICILLIN >=32 Resistant     AMOX/CLAVULANIC 4 Sensitive     AMPICILLIN/SULBACTAM 16 Intermediate     PIP/TAZO <=4 Sensitive     IMIPENEM <=0.25 Sensitive     CEFAZOLIN <=4 Sensitive     CEFTRIAXONE <=1 Sensitive     CEFTAZIDIME <=1 Sensitive     CEFEPIME <=1 Sensitive     GENTAMICIN <=1 Sensitive     TOBRAMYCIN <=1 Sensitive     CIPROFLOXACIN >=4 Resistant     LEVOFLOXACIN >=8 Resistant     NITROFURANTOIN <=16 Sensitive     TRIMETH/SULFA <=20 Sensitive   Blood Culture (routine x 2)     Status: None (Preliminary result)   Collection Time: 11/13/14  2:45 PM  Result Value Ref Range Status   Specimen Description BLOOD  RIGHT FOREARM  Final   Special Requests BOTTLES DRAWN AEROBIC AND ANAEROBIC 5ML  Final   Culture   Final           BLOOD CULTURE RECEIVED NO GROWTH TO DATE CULTURE WILL BE HELD FOR 5 DAYS BEFORE  ISSUING A FINAL NEGATIVE REPORT Performed at [facility]    Report Status PENDING  Incomplete  Blood Culture (routine x 2)     Status: None (Preliminary result)   Collection Time: 11/13/14  2:50 PM  Result Value Ref Range Status   Specimen Description BLOOD RIGHT ANTECUBITAL  Final   Special Requests BOTTLES DRAWN AEROBIC AND ANAEROBIC 5CC  Final   Culture   Final           BLOOD CULTURE RECEIVED NO GROWTH TO DATE CULTURE WILL BE HELD FOR 5 DAYS BEFORE ISSUING A FINAL NEGATIVE REPORT Performed at [facility]    Report Status PENDING  Incomplete  Urine culture     Status: None   Collection Time: 11/13/14  5:20 PM  Result Value Ref Range Status   Specimen Description URINE, CLEAN CATCH  Final   Special Requests NONE  Final   Colony Count NO GROWTH Performed at [facility]   Final   Culture NO GROWTH Performed at [facility]   Final   Report Status 11/14/2014 FINAL  Final     Studies:  Recent x-ray studies have been reviewed in detail by the Attending Physician  Scheduled Meds:  Scheduled Meds: . albuterol  5 mg Nebulization Once  . amoxicillin-clavulanate  1 tablet Oral BID WC  . antiseptic oral rinse  7 mL Mouth Rinse BID  . arformoterol  15 mcg Nebulization BID  . budesonide  0.25 mg Nebulization BID  . calcium-vitamin D  1 tablet Oral BID  . citalopram  20 mg Oral Daily  . folic acid  1 mg Oral Daily   And  . ferrous fumarate  1 tablet Oral Daily  . fluticasone  2 spray Each Nare BID  . heparin  5,000 Units Subcutaneous 3 times per day  . ipratropium-albuterol  3 mL Nebulization Q4H WA  . loratadine  10 mg Oral Daily  . mirtazapine  15 mg Oral QHS  . pantoprazole  40 mg Oral Daily  . predniSONE  60 mg Oral Q breakfast   Continuous  Infusions:   Time spent on care of this patient: 35 min   [place], MD 11/16/2014, 12:29 PM  LOS: 3 days   [facility] Office  [phone] Pager - Text Page per [email or web address]  If 7PM-7AM, please contact night-coverage [email or web address]

## 2014-11-16 NOTE — Progress Notes (Signed)
PT Cancellation Note  Patient Details Name: [name] MRN: [id number] DOB: 11-06-1940   Cancelled Treatment:    Reason Eval/Treat Not Completed: Other (comment) (pt declines further PT/therapies due to feels no skilled need "The techs can mobilize me just fine".  Will sign off per pt's wishes. 11/16/2014  Donnella Sham, [place] [phone]  (pager) Katharyn Schauer, Tessie Fass 11/16/2014, 3:41 PM

## 2014-11-17 LAB — RESPIRATORY VIRUS PANEL
Adenovirus: NEGATIVE
INFLUENZA B 1: NEGATIVE
Influenza A: NEGATIVE
Metapneumovirus: NEGATIVE
Parainfluenza 1: NEGATIVE
Parainfluenza 2: NEGATIVE
Parainfluenza 3: NEGATIVE
RESPIRATORY SYNCYTIAL VIRUS A: NEGATIVE
RESPIRATORY SYNCYTIAL VIRUS B: NEGATIVE
Rhinovirus: NEGATIVE

## 2014-11-17 MED ORDER — PREDNISONE 10 MG PO KIT: PACK | ORAL | Status: DC

## 2014-11-17 MED ORDER — CEPHALEXIN 500 MG PO CAPS: 500.0000 mg | ORAL_CAPSULE | Freq: Two times a day (BID) | ORAL | Status: DC

## 2014-11-17 MED ORDER — PREDNISONE 10 MG PO TABS: 5.0000 mg | ORAL_TABLET | Freq: Every day | ORAL | Status: DC

## 2014-11-17 MED ORDER — IPRATROPIUM-ALBUTEROL 0.5-2.5 (3) MG/3ML IN SOLN: 3.0000 mL | Freq: Four times a day (QID) | RESPIRATORY_TRACT | Status: DC | PRN

## 2014-11-17 NOTE — Progress Notes (Signed)
CARE MANAGEMENT NOTE 11/17/2014  Patient:  [name],[name]   Account Number:  [id number]  Date Initiated:  11/15/2014  Documentation initiated by:  Tomi Bamberger  Subjective/Objective Assessment:   dx copd  admit- lives with spouse.     Action/Plan:   pt eval- rec hhpt and 3 n 1   Anticipated DC Date:  11/16/2014   Anticipated DC Plan:  [place]  CM consult      Choice offered to / List presented to:     DME arranged  NEBULIZER MACHINE      DME agency  [place] arranged  [place] - 11 Patient Refused      Status of service:  Completed, signed off Medicare Important Message given?  YES (If response is "NO", the following Medicare IM given date fields will be blank) Date Medicare IM given:  11/15/2014 Medicare IM given by:  Tomi Bamberger Date Additional Medicare IM given:  11/17/2014 Additional Medicare IM given by:  Jonnie Finner  Discharge Disposition:  HOME/SELF CARE  Per UR Regulation:  Reviewed for med. necessity/level of care/duration of stay  If discussed at [place] of Stay Meetings, dates discussed:    Comments:  11/17/2014 1030 NCM spoke to pt and offered choice for [facility]. Pt states she does not need HH at this time. Pt states she does not need 3n1 for home. She is requesting new nebulizer machine for home. States her machine is 73 years old. States she receives her oxygen from [place]. Will fax referral to [place] once orders received.  Jonnie Finner RN CCM Case Mgmt phone [phone]

## 2014-11-17 NOTE — Progress Notes (Signed)
Faxed orders, dc summary and facesheet to [facility] for neb machine to be delivered to the home. Contacted [employer] rep, Jeneen Rinks to make aware of new orders for DME. Jonnie Finner RN CCM Case Mgmt phone [phone]

## 2014-11-17 NOTE — Discharge Summary (Signed)
Discharge Summary  [name] RSW:[id number] DOB: 1941-05-19  PCP: Penni Homans, MD  Admit date: 11/13/2014 Discharge date: 11/17/2014  Time spent: <27mns  Recommendations for Outpatient Follow-up:  1. pcp to ensure UTI complete resolved 2. pulmonology for copd  Discharge Diagnoses:  Active Hospital Problems   Diagnosis Date Noted  . COPD exacerbation 09/08/2013  . Chronic respiratory failure 12/10/2013  . UTI (lower urinary tract infection) 06/07/2012  . Anemia 04/19/2012  . Hypothyroidism 10/27/2007  . Depression with anxiety 10/17/2007  . HYPERTENSION, BENIGN ESSENTIAL 07/31/2007  . GERD 07/31/2007    Resolved Hospital Problems   Diagnosis Date Noted Date Resolved  No resolved problems to display.    Discharge Condition: stable, improved  Diet recommendation: cardiac diet  Filed Weights   11/13/14 1913  Weight: 44.9 kg (98 lb 15.8 oz)    History of present illness:  [name] NOTARIANNIis a 74y.o. female with past medical history of hypertension, hyperlipidemia, GERD, COPD on 3 L oxygen at home, depression, anxiety, who presents with shortness of breath.  Patient reports that yesterday she was started treatment for UTI by her PCP because of dysuria. She was given description of nitrofurantoin. She feels better after started taking nitrofurantoin. Since this morning, she started having worsening shortness of breath. She has chronic cough due to COPD which has not significantly worsened. No sputum production. She has fever, headache and nausea. She does not have vomiting, diarrhea or abdominal pain. She denies any chest pain. She reports that she traveled recently by driving for 3 hours to and from, totally 6 hours. She does not have tenderness over the calf areas. Patient denies chest pain, abdominal pain, diarrhea, skin rashes or leg swelling.  Work up in the ED demonstrates elevated d-dimer, positive urinalysis for UTI, BNP 88, lactate 1.14, leukocytosis with  WBC 12.5 (patient is on prednisone 5 mg daily). chest x-ray showed COPD without infiltration.    Hospital Course:  Principal Problem:   COPD exacerbation Active Problems:   Hypothyroidism   Depression with anxiety   HYPERTENSION, BENIGN ESSENTIAL   GERD   Anemia   UTI (lower urinary tract infection)   Chronic respiratory failure  Principal Problem:  COPD exacerbation- severe COPD on home O2- Chronic respiratory failure - CT chest reveals only emphysema - consulted pulm due to patient' s request  - Improving slowly- at time of discharge with minimal wheezing at right lung base. No wheezing on left side. Patient ambulate with 3liter oxygen without difficulty, patient reported back to baseline wanted to be discharged home. She is discharged home with prednisone taper/keflex and pulmonology close follow up.  Active Problems:  UTI - U cx on 1/15 ecoli sensitive to nitrofurantoin, repeat culture on admission no growth as she had already taked 2 doses of Nitrofurantoin at home. Patient reported rash with nitrofurantoin, abx changed to keflex per sensitivity report.   Depression with anxiety Cont celexa   GERD -cont Zegrid   Anemia, h/h stable at discharge   Fever on admission  - fever of 101 on admission - UA positive -given keflex at discharge to finish treatment  Procedures:  none  Consultations:  Pulmonology   Discharge Exam: BP 140/69 mmHg  Pulse 85  Temp(Src) 99.1 F (37.3 C) (Oral)  Resp 18  Ht _0  (1.549 m)  Wt 44.9 kg (98 lb 15.8 oz)  BMI 18.71 kg/m2  SpO2 96%  General: nad Cardiovascular: RRR Respiratory: good air entry, mild wheezing right lower lung zone,  Discharge Instructions You were cared for by a hospitalist during your hospital stay. If you have any questions about your discharge medications or the care you received while you were in the hospital after you are discharged, you can call the unit and asked to speak with the  hospitalist on call if the hospitalist that took care of you is not available. Once you are discharged, your primary care physician will handle any further medical issues. Please note that NO REFILLS for any discharge medications will be authorized once you are discharged, as it is imperative that you return to your primary care physician (or establish a relationship with a primary care physician if you do not have one) for your aftercare needs so that they can reassess your need for medications and monitor your lab values.  Discharge Instructions    Diet - low sodium heart healthy    Complete by:  As directed      Increase activity slowly    Complete by:  As directed             Medication List    STOP taking these medications        nitrofurantoin (macrocrystal-monohydrate) 100 MG capsule  Commonly known as:  MACROBID      TAKE these medications        albuterol (2.5 MG/3ML) 0.083% nebulizer solution  Commonly known as:  PROVENTIL  Take 3 mLs (2.5 mg total) by nebulization every 4 (four) hours as needed for wheezing or shortness of breath. Dx 496     albuterol 108 (90 BASE) MCG/ACT inhaler  Commonly known as:  PROVENTIL HFA;VENTOLIN HFA  Inhale 2 puffs into the lungs every 6 (six) hours as needed for wheezing or shortness of breath.     ALPRAZolam 0.5 MG tablet  Commonly known as:  XANAX  1 tab po bid and 2 tabs po qhs prn anxiety, insomnia     arformoterol 15 MCG/2ML Nebu  Commonly known as:  BROVANA  Take 2 mLs (15 mcg total) by nebulization 2 (two) times daily. Dx 496     budesonide 0.25 MG/2ML nebulizer solution  Commonly known as:  PULMICORT  Take 2 mLs (0.25 mg total) by nebulization 2 (two) times daily. Dx: 496     Calcium Carbonate-Vitamin D 600-400 MG-UNIT per tablet  Take 1 tablet by mouth 2 (two) times daily.     cephALEXin 500 MG capsule  Commonly known as:  KEFLEX  Take 1 capsule (500 mg total) by mouth 2 (two) times daily.     citalopram 20 MG tablet    Commonly known as:  CELEXA  Take 1 tablet (20 mg total) by mouth daily.     Ferrous Fumarate-Folic Acid 597-4 MG Tabs  Commonly known as:  HEMOCYTE-F  Take 1 tablet by mouth daily.     fexofenadine 180 MG tablet  Commonly known as:  ALLEGRA  Take 180 mg by mouth daily as needed (for allergies).     GLUCOSAMINE CHONDR COMPLEX PO  Take 1 tablet by mouth 2 (two) times daily.     mirtazapine 15 MG tablet  Commonly known as:  REMERON  Take 1 tablet (15 mg total) by mouth at bedtime.     omeprazole-sodium bicarbonate 40-1100 MG per capsule  Commonly known as:  ZEGERID  Take 1 capsule by mouth 2 (two) times daily.     predniSONE 10 MG tablet  Commonly known as:  DELTASONE  Take 0.5 tablets (5 mg total) by mouth  daily. Start taking this after finish taper from high dose prednisone     PredniSONE 10 MG Kit  - Taper from 82m po qd over 6 days.  - day1 667m then 5025mthen 52m58mhen 30mg54men20mg,79mn 10mg, 35m stop.     VITAMIN C ER PO  Take 1 Package by mouth daily.       Allergies  Allergen Reactions  . Morphine Other (See Comments)    REACTION: nightmares  . Ambien [Zolpidem] Anxiety  . Cymbalta [Duloxetine Hcl] Anxiety  . Nitrofurantoin Rash       Follow-up Information    Follow up with BLYTH, Penni Homans 1 week.   Specialty:  Family Medicine   Contact information:   [address] [phone] up with ALVA,RARigoberto Noeln 2 weeks.   Specialty:  Pulmonary Disease   Contact information:   [address] [id number]        The results of significant diagnostics from this hospitalization (including imaging, microbiology, ancillary and laboratory) are listed below for reference.    Significant Diagnostic Studies: Ct Angio Chest Pe W/cm &/or Wo Cm  11/14/2014   CLINICAL DATA:  Acute onset of shortness of breath and mild wheezing. Elevated D-dimer. Initial encounter.  EXAM: CT  ANGIOGRAPHY CHEST WITH CONTRAST  TECHNIQUE: Multidetector CT imaging of the chest was performed using the standard protocol during bolus administration of intravenous contrast. Multiplanar CT image reconstructions and MIPs were obtained to evaluate the vascular anatomy.  CONTRAST:  75mL OM64mQUE IOHEXOL 350 MG/ML SOLN  COMPARISON:  Chest radiograph performed 11/13/2014  FINDINGS: There is no evidence of pulmonary embolus.  Bilateral emphysematous change is noted. Minimal peripheral atelectasis or scarring is seen. There is no evidence of significant focal consolidation, pleural effusion or pneumothorax. No masses are identified; no abnormal focal contrast enhancement is seen.  The mediastinum is unremarkable in appearance. No mediastinal lymphadenopathy is seen. The great vessels are grossly unremarkable. Incidental note is made of a direct origin of the left vertebral artery from the aortic arch. No axillary lymphadenopathy is seen. The visualized portions of the thyroid gland are unremarkable in appearance.  The visualized portions of the liver and spleen are unremarkable. The patient is status post cholecystectomy, with clips noted along the gallbladder fossa. The visualized portions of the pancreas, adrenal glands and kidneys are within normal limits.  No acute osseous abnormalities are seen.  Review of the MIP images confirms the above findings.  IMPRESSION: 1. No evidence of pulmonary embolus. 2. Bilateral emphysematous change noted. Minimal peripheral atelectasis or scarring seen. Lungs otherwise clear.   Electronically Signed   By: Jeffery Garald BaldingOn: 11/14/2014 02:27   Dg Chest Port 1 View  11/13/2014   CLINICAL DATA:  Patient c/o increased shortness of breath that started this morning. Hx of copd and htn. Former smoker.  EXAM: PORTABLE CHEST - 1 VIEW  COMPARISON:  08/16/2014.  FINDINGS: Borderline enlarged cardiac silhouette with a mild increase in size. Increased prominence of the pulmonary  vasculature and interstitial markings. The lungs remain hyperexpanded. No pleural fluid. Mild scoliosis.  IMPRESSION: Interval borderline cardiomegaly and mild changes of congestive heart failure superimposed on COPD.   Electronically Signed   By: Steve  REnrique SackOn: 11/13/2014 15:04    Microbiology: Recent Results (from the past 240 hour(s))  Urine Culture  Status: None   Collection Time: 11/12/14  4:24 PM  Result Value Ref Range Status   Culture ESCHERICHIA COLI  Final   Colony Count >=100,000 COLONIES/ML  Final   Organism ID, Bacteria ESCHERICHIA COLI  Final      Susceptibility   Escherichia coli -  (no method available)    AMPICILLIN >=32 Resistant     AMOX/CLAVULANIC 4 Sensitive     AMPICILLIN/SULBACTAM 16 Intermediate     PIP/TAZO <=4 Sensitive     IMIPENEM <=0.25 Sensitive     CEFAZOLIN <=4 Sensitive     CEFTRIAXONE <=1 Sensitive     CEFTAZIDIME <=1 Sensitive     CEFEPIME <=1 Sensitive     GENTAMICIN <=1 Sensitive     TOBRAMYCIN <=1 Sensitive     CIPROFLOXACIN >=4 Resistant     LEVOFLOXACIN >=8 Resistant     NITROFURANTOIN <=16 Sensitive     TRIMETH/SULFA <=20 Sensitive   Blood Culture (routine x 2)     Status: None (Preliminary result)   Collection Time: 11/13/14  2:45 PM  Result Value Ref Range Status   Specimen Description BLOOD RIGHT FOREARM  Final   Special Requests BOTTLES DRAWN AEROBIC AND ANAEROBIC 5ML  Final   Culture   Final           BLOOD CULTURE RECEIVED NO GROWTH TO DATE CULTURE WILL BE HELD FOR 5 DAYS BEFORE ISSUING A FINAL NEGATIVE REPORT Performed at [facility]    Report Status PENDING  Incomplete  Blood Culture (routine x 2)     Status: None (Preliminary result)   Collection Time: 11/13/14  2:50 PM  Result Value Ref Range Status   Specimen Description BLOOD RIGHT ANTECUBITAL  Final   Special Requests BOTTLES DRAWN AEROBIC AND ANAEROBIC 5CC  Final   Culture   Final           BLOOD CULTURE RECEIVED NO GROWTH TO DATE CULTURE WILL BE  HELD FOR 5 DAYS BEFORE ISSUING A FINAL NEGATIVE REPORT Performed at [facility]    Report Status PENDING  Incomplete  Urine culture     Status: None   Collection Time: 11/13/14  5:20 PM  Result Value Ref Range Status   Specimen Description URINE, CLEAN CATCH  Final   Special Requests NONE  Final   Colony Count NO GROWTH Performed at [facility]   Final   Culture NO GROWTH Performed at [facility]   Final   Report Status 11/14/2014 FINAL  Final     Labs: Basic Metabolic Panel:  Recent Labs Lab 11/12/14 1513 11/13/14 1445 11/14/14 0545  NA 144 138 144  K 3.6 3.9 4.1  CL 99 96 102  CO2 35* 35* 37*  GLUCOSE 118* 129* 137*  BUN 24* 22 15  CREATININE 0.58 0.43* 0.50  CALCIUM 8.4 8.8 8.5   Liver Function Tests:  Recent Labs Lab 11/12/14 1513 11/13/14 1445 11/14/14 0545  AST 20 52* 30  ALT _0 ALKPHOS 61 79 58  BILITOT 0.2 0.3 0.5  PROT 6.1 7.0 5.6*  ALBUMIN 3.7 4.1 3.0*   No results for input(s): LIPASE, AMYLASE in the last 168 hours. No results for input(s): AMMONIA in the last 168 hours. CBC:  Recent Labs Lab 11/12/14 1513 11/13/14 1445 11/14/14 0545  WBC 10.6* 12.5* 8.8  NEUTROABS  --  10.8* 8.3*  HGB 12.3 12.6 10.8*  HCT 39.5 41.3 35.9*  MCV 87.2 91.0 90.0  PLT 352 299 280  Cardiac Enzymes: No results for input(s): CKTOTAL, CKMB, CKMBINDEX, TROPONINI in the last 168 hours. BNP: BNP (last 3 results) No results for input(s): PROBNP in the last 8760 hours. CBG: No results for input(s): GLUCAP in the last 168 hours.     Signed:  Cabrini Ruggieri  [facility] 11/17/2014, 12:19 PM

## 2014-11-17 NOTE — Progress Notes (Signed)
Nsg Discharge Note  Admit Date:  11/13/2014 Discharge date: 11/17/2014   Crissie Figures to be D/C'd Home per MD order.  AVS completed.  Copy for chart, and copy for patient signed, and dated. Patient/caregiver able to verbalize understanding.  Discharge Medication:   Medication List    STOP taking these medications        nitrofurantoin (macrocrystal-monohydrate) 100 MG capsule  Commonly known as:  MACROBID      TAKE these medications        albuterol (2.5 MG/3ML) 0.083% nebulizer solution  Commonly known as:  PROVENTIL  Take 3 mLs (2.5 mg total) by nebulization every 4 (four) hours as needed for wheezing or shortness of breath. Dx 496     albuterol 108 (90 BASE) MCG/ACT inhaler  Commonly known as:  PROVENTIL HFA;VENTOLIN HFA  Inhale 2 puffs into the lungs every 6 (six) hours as needed for wheezing or shortness of breath.     ALPRAZolam 0.5 MG tablet  Commonly known as:  XANAX  1 tab po bid and 2 tabs po qhs prn anxiety, insomnia     arformoterol 15 MCG/2ML Nebu  Commonly known as:  BROVANA  Take 2 mLs (15 mcg total) by nebulization 2 (two) times daily. Dx 496     budesonide 0.25 MG/2ML nebulizer solution  Commonly known as:  PULMICORT  Take 2 mLs (0.25 mg total) by nebulization 2 (two) times daily. Dx: 496     Calcium Carbonate-Vitamin D 600-400 MG-UNIT per tablet  Take 1 tablet by mouth 2 (two) times daily.     cephALEXin 500 MG capsule  Commonly known as:  KEFLEX  Take 1 capsule (500 mg total) by mouth 2 (two) times daily.     citalopram 20 MG tablet  Commonly known as:  CELEXA  Take 1 tablet (20 mg total) by mouth daily.     Ferrous Fumarate-Folic Acid 791-5 MG Tabs  Commonly known as:  HEMOCYTE-F  Take 1 tablet by mouth daily.     fexofenadine 180 MG tablet  Commonly known as:  ALLEGRA  Take 180 mg by mouth daily as needed (for allergies).     GLUCOSAMINE CHONDR COMPLEX PO  Take 1 tablet by mouth 2 (two) times daily.     ipratropium-albuterol  0.5-2.5 (3) MG/3ML Soln  Commonly known as:  DUONEB  Take 3 mLs by nebulization every 6 (six) hours as needed.     mirtazapine 15 MG tablet  Commonly known as:  REMERON  Take 1 tablet (15 mg total) by mouth at bedtime.     omeprazole-sodium bicarbonate 40-1100 MG per capsule  Commonly known as:  ZEGERID  Take 1 capsule by mouth 2 (two) times daily.     predniSONE 10 MG tablet  Commonly known as:  DELTASONE  Take 0.5 tablets (5 mg total) by mouth daily. Start taking this after finish taper from high dose prednisone     PredniSONE 10 MG Kit  - Taper from 17m po qd over 6 days.  - day1 680m then 5099mthen 21m74mhen 30mg20men20mg,26mn 10mg, 51m stop.     VITAMIN C ER PO  Take 1 Package by mouth daily.        Discharge Assessment: Filed Vitals:   11/17/14 0615  BP: 140/69  Pulse: 85  Temp: 99.1 F (37.3 C)  Resp: 18   Skin clean, dry and intact without evidence of skin break down, no evidence of skin tears noted. Some generalized redness noted, pt.  States " that's from an antibiotic I took at home". IV catheter discontinued intact. Site without signs and symptoms of complications - no redness or edema noted at insertion site, patient denies c/o pain - only slight tenderness at site.  Dressing with slight pressure applied.  D/c Instructions-Education: Discharge instructions given to patient/family with verbalized understanding. D/c education completed with patient/family including follow up instructions, medication list, d/c activities limitations if indicated, with other d/c instructions as indicated by MD - patient able to verbalize understanding, all questions fully answered. Patient instructed to return to ED, call 911, or call MD for any changes in condition.  Patient escorted via [place], and D/C home via private auto.  Dayle Points, RN 11/17/2014 1:17 PM

## 2014-11-18 ENCOUNTER — Telehealth: Payer: Self-pay

## 2014-11-18 NOTE — Telephone Encounter (Signed)
Admit date: 11/13/2014 Discharge date: 11/17/2014  Reason for admission:  COPD exacerbation  Left a message for call back.

## 2014-11-18 NOTE — Telephone Encounter (Signed)
Admit date: 11/13/2014  Discharge date: 11/17/2014   Reason for admission: COPD exacerbation  Transition Care Management Follow-up Telephone Call  How have you been since you were released from the hospital? Pt states she feels better today than yesterday.  However, pt sounds in respiratory distress over the phone.  Pt had to stop periodically during the conversation to caught her breath.  She admitted that was short of breath and having difficulty speaking.  She denies chest pain, chest tightness, fever, and cough.  She is wearing 3L of oxygen.  She has nebulizer set up to give self a breathing treatment.  Pt was strongly advised to give self a breathing treatment, if no improvement GO IMMEDIATELY TO ER.  Pt stated understanding and agreed to comply.       Do you understand why you were in the hospital? yes   Do you understand the discharge instructions? yes  Items Reviewed:  Medications reviewed: yes  Allergies reviewed: yes  Dietary changes reviewed: yes  Referrals reviewed: yes, needs to schedule appointment with Dr. Elsworth Soho, pulmonology.    Functional Questionnaire:   Activities of Daily Living (ADLs):   She states they are independent in the following: Pt states she is too weak to complete ADLS on her own.  Husband is having to assist her.  States they require assistance with the following: see note above.   Any transportation issues/concerns?: no   Any patient concerns? no   Confirmed importance and date/time of follow-up visits scheduled: yes   Confirmed with patient if condition begins to worsen call PCP or go to the ER: yes   Hospital follow up appointment scheduled for Tuesday, 11/23/14 @ 8 am with Dr. Charlett Blake.

## 2014-11-19 LAB — CULTURE, BLOOD (ROUTINE X 2)
CULTURE: NO GROWTH
Culture: NO GROWTH

## 2014-11-21 DIAGNOSIS — R300 Dysuria: Secondary | ICD-10-CM | POA: Insufficient documentation

## 2014-11-21 NOTE — Progress Notes (Signed)
Patient ID: [name], female   DOB: May 10, 1941, 74 y.o.   MRN: [id number]   [name]  [id number] 29-May-1941 11/21/2014      Progress Note-Follow Up  Subjective  Chief Complaint  Chief Complaint  Patient presents with  . Urinary Frequency    with burning sensation during urination    HPI  Patient is a 74 y.o. female in today for routine medical care. Patient is noting urinary burning and frequency, no hematuria, fevers or chills. Had been feeling well until these symptoms developed in the past day or two. Denies CP/palp/SOB/HA/congestion/fevers/GI c/o. Taking meds as prescribed  Past Medical History  Diagnosis Date  . COPD (chronic obstructive pulmonary disease)   . Thyroid disease   . Glucose intolerance (impaired glucose tolerance)   . History of small bowel obstruction   . Arthralgia   . Dyspnea   . Pallor   . Chest pain   . Osteoporosis   . Dysuria   . Weight loss   . Edema   . Weakness   . Anxiety   . Depression   . Migraine   . Hypertension   . Leukocytosis   . GERD (gastroesophageal reflux disease)   . Atrophic vaginitis 12/07/2012  . Hx of adenomatous colonic polyps   . Ischemic colitis   . Nocturia 09/02/2013  . Diarrhea 09/02/2013  . Hyperglycemia 12/01/2013    Past Surgical History  Procedure Laterality Date  . Cholecystectomy    . Abdominal hysterectomy  1993  . Tubal ligation    . Bladder repair    . Dilation and curettage of uterus      40 yrs ago  . Eye surgery  04/2011    bil. eyes  . Colonoscopy with propofol N/A 06/08/2013    Procedure: COLONOSCOPY WITH PROPOFOL;  Surgeon: Lafayette Dragon, MD;  Location: [facility];  Service: Endoscopy;  Laterality: N/A;    Family History  Problem Relation Age of Onset  . Alcohol abuse Mother   . Heart disease Mother   . Stroke Mother   . Hypertension Mother   . Kidney disease Mother   . COPD Mother   . Osteoporosis Mother     hip fracture  . COPD Brother   . Alcohol abuse Brother     drug abuse  . Hearing loss Brother     mvp  . Colon cancer Paternal Aunt   . Prostate cancer    . Alcohol abuse Father     MVA  . Thyroid disease Daughter   . COPD Paternal Grandfather   . COPD Sister   . Hypertension Sister   . Anxiety disorder Sister   . COPD Sister   . COPD Brother     current smoker  . Hearing loss Brother     cad  . COPD Brother     History   Social History  . Marital Status: Married    Spouse Name: N/A    Number of Children: 1  . Years of Education: N/A   Occupational History  . Retired Hydrologist    Social History Main Topics  . Smoking status: Former Smoker -- 2.00 packs/day for 40 years    Types: Cigarettes    Quit date: 10/29/2001  . Smokeless tobacco: Never Used  . Alcohol Use: No  . Drug Use: No  . Sexual Activity: Not on file   Other Topics Concern  . Not on file   Social History Narrative  Current Outpatient Prescriptions on File Prior to Visit  Medication Sig Dispense Refill  . albuterol (PROVENTIL HFA;VENTOLIN HFA) 108 (90 BASE) MCG/ACT inhaler Inhale 2 puffs into the lungs every 6 (six) hours as needed for wheezing or shortness of breath. 3 Inhaler 3  . albuterol (PROVENTIL) (2.5 MG/3ML) 0.083% nebulizer solution Take 3 mLs (2.5 mg total) by nebulization every 4 (four) hours as needed for wheezing or shortness of breath. Dx 496 300 mL 3  . ALPRAZolam (XANAX) 0.5 MG tablet 1 tab po bid and 2 tabs po qhs prn anxiety, insomnia (Patient taking differently: Take 0.5-1.25 mg by mouth 3 (three) times daily. Takes 1 tablet twice daily, then takes 1.5 tablets at bedtime) 360 tablet 1  . arformoterol (BROVANA) 15 MCG/2ML NEBU Take 2 mLs (15 mcg total) by nebulization 2 (two) times daily. Dx 496 360 mL 3  . budesonide (PULMICORT) 0.25 MG/2ML nebulizer solution Take 2 mLs (0.25 mg total) by nebulization 2 (two) times daily. Dx: 496 360 mL 3  . Calcium Carbonate-Vitamin D 600-400 MG-UNIT per tablet Take 1 tablet by mouth  2 (two) times daily.      . citalopram (CELEXA) 20 MG tablet Take 1 tablet (20 mg total) by mouth daily. (Patient taking differently: Take 10 mg by mouth daily. ) 90 tablet 1  . fexofenadine (ALLEGRA) 180 MG tablet Take 180 mg by mouth daily as needed (for allergies).     . Glucosamine-Chondroitin (GLUCOSAMINE CHONDR COMPLEX PO) Take 1 tablet by mouth 2 (two) times daily.     . mirtazapine (REMERON) 15 MG tablet Take 1 tablet (15 mg total) by mouth at bedtime. 90 tablet 1  . omeprazole-sodium bicarbonate (ZEGERID) 40-1100 MG per capsule Take 1 capsule by mouth 2 (two) times daily. 180 capsule 1   No current facility-administered medications on file prior to visit.    Allergies  Allergen Reactions  . Morphine Other (See Comments)    REACTION: nightmares  . Ambien [Zolpidem] Anxiety  . Cymbalta [Duloxetine Hcl] Anxiety  . Nitrofurantoin Rash    Review of Systems  Review of Systems  Constitutional: Negative for fever and malaise/fatigue.  HENT: Negative for congestion.   Eyes: Negative for discharge.  Respiratory: Negative for shortness of breath.   Cardiovascular: Negative for chest pain, palpitations and leg swelling.  Gastrointestinal: Negative for nausea, abdominal pain and diarrhea.  Genitourinary: Positive for dysuria, urgency and frequency. Negative for hematuria and flank pain.  Musculoskeletal: Negative for falls.  Skin: Negative for rash.  Neurological: Negative for loss of consciousness and headaches.  Endo/Heme/Allergies: Negative for polydipsia.  Psychiatric/Behavioral: Negative for depression and suicidal ideas. The patient is not nervous/anxious and does not have insomnia.     Objective  BP 164/73 mmHg  Pulse 96  Temp(Src) 97.9 F (36.6 C) (Oral)  Resp 16  Wt 100 lb (45.36 kg)  SpO2 96%  Physical Exam  Physical Exam  Constitutional: She is oriented to person, place, and time and well-developed, well-nourished, and in no distress. No distress.  HENT:    Head: Normocephalic and atraumatic.  Eyes: Conjunctivae are normal.  Neck: Neck supple. No thyromegaly present.  Cardiovascular: Normal rate, regular rhythm and normal heart sounds.   No murmur heard. Pulmonary/Chest: Effort normal and breath sounds normal. She has no wheezes.  Abdominal: She exhibits no distension and no mass.  Musculoskeletal: She exhibits no edema.  Lymphadenopathy:    She has no cervical adenopathy.  Neurological: She is alert and oriented to person, place, and time.  Skin: Skin is warm and dry. No rash noted. She is not diaphoretic.  Psychiatric: Memory, affect and judgment normal.    Lab Results  Component Value Date   TSH 1.504 11/12/2014   Lab Results  Component Value Date   WBC 8.8 11/14/2014   HGB 10.8* 11/14/2014   HCT 35.9* 11/14/2014   MCV 90.0 11/14/2014   PLT 280 11/14/2014   Lab Results  Component Value Date   CREATININE 0.50 11/14/2014   BUN 15 11/14/2014   NA 144 11/14/2014   K 4.1 11/14/2014   CL 102 11/14/2014   CO2 37* 11/14/2014   Lab Results  Component Value Date   ALT 31 11/14/2014   AST 30 11/14/2014   ALKPHOS 58 11/14/2014   BILITOT 0.5 11/14/2014   Lab Results  Component Value Date   CHOL 171 11/12/2014   Lab Results  Component Value Date   HDL 65 11/12/2014   Lab Results  Component Value Date   LDLCALC 29 11/12/2014   Lab Results  Component Value Date   TRIG 384* 11/12/2014   Lab Results  Component Value Date   CHOLHDL 2.6 11/12/2014     Assessment & Plan  HYPERTENSION, BENIGN ESSENTIAL Improved on recheck but mildly elevated with acute illness. no changes to meds. Encouraged heart healthy diet such as the DASH diet and exercise as tolerated.    UTI (lower urinary tract infection) EColi, given rx for Macrobid but unfortunately had an adverse reaction, UTI cleared after Keflex

## 2014-11-21 NOTE — Assessment & Plan Note (Signed)
EColi, given rx for Macrobid but unfortunately had an adverse reaction, UTI cleared after Keflex

## 2014-11-21 NOTE — Assessment & Plan Note (Signed)
Improved on recheck but mildly elevated with acute illness. no changes to meds. Encouraged heart healthy diet such as the DASH diet and exercise as tolerated.

## 2014-11-23 ENCOUNTER — Ambulatory Visit (INDEPENDENT_AMBULATORY_CARE_PROVIDER_SITE_OTHER): Payer: Medicare PPO | Admitting: Family Medicine

## 2014-11-23 ENCOUNTER — Encounter: Payer: Self-pay | Admitting: Family Medicine

## 2014-11-23 VITALS — BP 146/72 | HR 79 | Temp 98.0°F | Resp 16 | Ht 61.0 in | Wt 97.5 lb

## 2014-11-23 DIAGNOSIS — I10 Essential (primary) hypertension: Secondary | ICD-10-CM | POA: Diagnosis not present

## 2014-11-23 DIAGNOSIS — N390 Urinary tract infection, site not specified: Secondary | ICD-10-CM | POA: Diagnosis not present

## 2014-11-23 DIAGNOSIS — E876 Hypokalemia: Secondary | ICD-10-CM

## 2014-11-23 DIAGNOSIS — K219 Gastro-esophageal reflux disease without esophagitis: Secondary | ICD-10-CM | POA: Diagnosis not present

## 2014-11-23 DIAGNOSIS — J441 Chronic obstructive pulmonary disease with (acute) exacerbation: Secondary | ICD-10-CM | POA: Diagnosis not present

## 2014-11-23 DIAGNOSIS — D649 Anemia, unspecified: Secondary | ICD-10-CM | POA: Diagnosis not present

## 2014-11-23 LAB — CBC
HCT: 37.9 % (ref 36.0–46.0)
Hemoglobin: 12.2 g/dL (ref 12.0–15.0)
MCHC: 32.2 g/dL (ref 30.0–36.0)
MCV: 86.9 fl (ref 78.0–100.0)
Platelets: 382 10*3/uL (ref 150.0–400.0)
RBC: 4.37 Mil/uL (ref 3.87–5.11)
RDW: 17.2 % — ABNORMAL HIGH (ref 11.5–15.5)
WBC: 13.7 10*3/uL — ABNORMAL HIGH (ref 4.0–10.5)

## 2014-11-23 LAB — COMPREHENSIVE METABOLIC PANEL
ALK PHOS: 46 U/L (ref 39–117)
ALT: 38 U/L — ABNORMAL HIGH (ref 0–35)
AST: 23 U/L (ref 0–37)
Albumin: 3.6 g/dL (ref 3.5–5.2)
BILIRUBIN TOTAL: 0.3 mg/dL (ref 0.2–1.2)
BUN: 24 mg/dL — AB (ref 6–23)
CALCIUM: 8.8 mg/dL (ref 8.4–10.5)
CO2: 37 mEq/L — ABNORMAL HIGH (ref 19–32)
CREATININE: 0.53 mg/dL (ref 0.40–1.20)
Chloride: 99 mEq/L (ref 96–112)
GFR: 120.4 mL/min (ref >60.00)
Glucose, Bld: 73 mg/dL (ref 70–99)
POTASSIUM: 3.4 meq/L — AB (ref 3.5–5.1)
SODIUM: 142 meq/L (ref 135–145)
TOTAL PROTEIN: 6.2 g/dL (ref 6.0–8.3)

## 2014-11-23 LAB — URINALYSIS
Bilirubin Urine: NEGATIVE
KETONES UR: NEGATIVE
Leukocytes, UA: NEGATIVE
NITRITE: NEGATIVE
PH: 6.5 (ref 5.0–8.0)
Specific Gravity, Urine: 1.02 (ref 1.000–1.030)
Total Protein, Urine: NEGATIVE
Urine Glucose: NEGATIVE
Urobilinogen, UA: 0.2 (ref 0.0–1.0)

## 2014-11-23 NOTE — Progress Notes (Signed)
Pre visit review using our clinic review tool, if applicable. No additional management support is needed unless otherwise documented below in the visit note/SLS  

## 2014-11-23 NOTE — Patient Instructions (Signed)
kegel exercises sets of 10 twice a dayUrinary Tract Infection Urinary tract infections (UTIs) can develop anywhere along your urinary tract. Your urinary tract is your body's drainage system for removing wastes and extra water. Your urinary tract includes two kidneys, two ureters, a bladder, and a urethra. Your kidneys are a pair of bean-shaped organs. Each kidney is about the size of your fist. They are located below your ribs, one on each side of your spine. CAUSES Infections are caused by microbes, which are microscopic organisms, including fungi, viruses, and bacteria. These organisms are so small that they can only be seen through a microscope. Bacteria are the microbes that most commonly cause UTIs. SYMPTOMS  Symptoms of UTIs may vary by age and gender of the patient and by the location of the infection. Symptoms in young women typically include a frequent and intense urge to urinate and a painful, burning feeling in the bladder or urethra during urination. Older women and men are more likely to be tired, shaky, and weak and have muscle aches and abdominal pain. A fever may mean the infection is in your kidneys. Other symptoms of a kidney infection include pain in your back or sides below the ribs, nausea, and vomiting. DIAGNOSIS To diagnose a UTI, your caregiver will ask you about your symptoms. Your caregiver also will ask to provide a urine sample. The urine sample will be tested for bacteria and white blood cells. White blood cells are made by your body to help fight infection. TREATMENT  Typically, UTIs can be treated with medication. Because most UTIs are caused by a bacterial infection, they usually can be treated with the use of antibiotics. The choice of antibiotic and length of treatment depend on your symptoms and the type of bacteria causing your infection. HOME CARE INSTRUCTIONS  If you were prescribed antibiotics, take them exactly as your caregiver instructs you. Finish the  medication even if you feel better after you have only taken some of the medication.  Drink enough water and fluids to keep your urine clear or pale yellow.  Avoid caffeine, tea, and carbonated beverages. They tend to irritate your bladder.  Empty your bladder often. Avoid holding urine for long periods of time.  Empty your bladder before and after sexual intercourse.  After a bowel movement, women should cleanse from front to back. Use each tissue only once. SEEK MEDICAL CARE IF:   You have back pain.  You develop a fever.  Your symptoms do not begin to resolve within 3 days. SEEK IMMEDIATE MEDICAL CARE IF:   You have severe back pain or lower abdominal pain.  You develop chills.  You have nausea or vomiting.  You have continued burning or discomfort with urination. MAKE SURE YOU:   Understand these instructions.  Will watch your condition.  Will get help right away if you are not doing well or get worse. Document Released: 07/25/2005 Document Revised: 04/15/2012 Document Reviewed: 11/23/2011 [facility] Patient Information 2015 [place], [place]. This information is not intended to replace advice given to you by your health care provider. Make sure you discuss any questions you have with your health care provider.

## 2014-11-23 NOTE — Progress Notes (Signed)
Patient ID: [name], female   DOB: 13-Oct-1941, 74 y.o.   MRN: [id number]   KAYSHA PARSELL  [id number] Apr 03, 1941 11/23/2014      Progress Note-Follow Up  Subjective  Chief Complaint  Chief Complaint  Patient presents with  . Hospitalization Follow-up    COPD Exacerbation; SOB; Respiratory Failure w/hypoxia; SIRS    HPI  Patient is a 74 y.o. female in today for routine medical care. Feeling much better. Was seen recently for UTI and placed on Macrobid. Unfortunately she had an allergic reaction to the med with rash, fever, sob and was hospitalized. Suffered a COPD exacerbation but is now doing. Denies any sob, congestion or acute concerns today. Her urinary symptoms have resolved. Denies CP/palp/SOB/HA/congestion/fevers/GI or GU c/o. Taking meds as prescribed  Past Medical History  Diagnosis Date  . COPD (chronic obstructive pulmonary disease)   . Thyroid disease   . Glucose intolerance (impaired glucose tolerance)   . History of small bowel obstruction   . Arthralgia   . Dyspnea   . Pallor   . Chest pain   . Osteoporosis   . Dysuria   . Weight loss   . Edema   . Weakness   . Anxiety   . Depression   . Migraine   . Hypertension   . Leukocytosis   . GERD (gastroesophageal reflux disease)   . Atrophic vaginitis 12/07/2012  . Hx of adenomatous colonic polyps   . Ischemic colitis   . Nocturia 09/02/2013  . Diarrhea 09/02/2013  . Hyperglycemia 12/01/2013    Past Surgical History  Procedure Laterality Date  . Cholecystectomy    . Abdominal hysterectomy  1993  . Tubal ligation    . Bladder repair    . Dilation and curettage of uterus      40 yrs ago  . Eye surgery  04/2011    bil. eyes  . Colonoscopy with propofol N/A 06/08/2013    Procedure: COLONOSCOPY WITH PROPOFOL;  Surgeon: Lafayette Dragon, MD;  Location: [facility];  Service: Endoscopy;  Laterality: N/A;    Family History  Problem Relation Age of Onset  . Alcohol abuse Mother   . Heart disease  Mother   . Stroke Mother   . Hypertension Mother   . Kidney disease Mother   . COPD Mother   . Osteoporosis Mother     hip fracture  . COPD Brother   . Alcohol abuse Brother     drug abuse  . Hearing loss Brother     mvp  . Colon cancer Paternal Aunt   . Prostate cancer    . Alcohol abuse Father     MVA  . Thyroid disease Daughter   . COPD Paternal Grandfather   . COPD Sister   . Hypertension Sister   . Anxiety disorder Sister   . COPD Sister   . COPD Brother     current smoker  . Hearing loss Brother     cad  . COPD Brother     History   Social History  . Marital Status: Married    Spouse Name: N/A    Number of Children: 1  . Years of Education: N/A   Occupational History  . Retired Hydrologist    Social History Main Topics  . Smoking status: Former Smoker -- 2.00 packs/day for 40 years    Types: Cigarettes    Quit date: 10/29/2001  . Smokeless tobacco: Never Used  . Alcohol Use: No  .  Drug Use: No  . Sexual Activity: Not on file   Other Topics Concern  . Not on file   Social History Narrative    Current Outpatient Prescriptions on File Prior to Visit  Medication Sig Dispense Refill  . albuterol (PROVENTIL HFA;VENTOLIN HFA) 108 (90 BASE) MCG/ACT inhaler Inhale 2 puffs into the lungs every 6 (six) hours as needed for wheezing or shortness of breath. 3 Inhaler 3  . albuterol (PROVENTIL) (2.5 MG/3ML) 0.083% nebulizer solution Take 3 mLs (2.5 mg total) by nebulization every 4 (four) hours as needed for wheezing or shortness of breath. Dx 496 300 mL 3  . ALPRAZolam (XANAX) 0.5 MG tablet 1 tab po bid and 2 tabs po qhs prn anxiety, insomnia (Patient taking differently: Take 0.5-1.25 mg by mouth 3 (three) times daily. Takes 1 tablet twice daily, then takes 1.5 tablets at bedtime) 360 tablet 1  . arformoterol (BROVANA) 15 MCG/2ML NEBU Take 2 mLs (15 mcg total) by nebulization 2 (two) times daily. Dx 496 360 mL 3  . Ascorbic Acid (VITAMIN C ER PO)  Take 1 Package by mouth daily.     . budesonide (PULMICORT) 0.25 MG/2ML nebulizer solution Take 2 mLs (0.25 mg total) by nebulization 2 (two) times daily. Dx: 496 360 mL 3  . Calcium Carbonate-Vitamin D 600-400 MG-UNIT per tablet Take 1 tablet by mouth 2 (two) times daily.      . citalopram (CELEXA) 20 MG tablet Take 1 tablet (20 mg total) by mouth daily. (Patient taking differently: Take 10 mg by mouth daily. ) 90 tablet 1  . Ferrous Fumarate-Folic Acid (HEMOCYTE-F) 324-1 MG TABS Take 1 tablet by mouth daily. 30 each 5  . fexofenadine (ALLEGRA) 180 MG tablet Take 180 mg by mouth daily as needed (for allergies).     . Glucosamine-Chondroitin (GLUCOSAMINE CHONDR COMPLEX PO) Take 1 tablet by mouth 2 (two) times daily.     Marland Kitchen ipratropium-albuterol (DUONEB) 0.5-2.5 (3) MG/3ML SOLN Take 3 mLs by nebulization every 6 (six) hours as needed. 360 mL 3  . mirtazapine (REMERON) 15 MG tablet Take 1 tablet (15 mg total) by mouth at bedtime. 90 tablet 1  . omeprazole-sodium bicarbonate (ZEGERID) 40-1100 MG per capsule Take 1 capsule by mouth 2 (two) times daily. 180 capsule 1  . predniSONE (DELTASONE) 10 MG tablet Take 0.5 tablets (5 mg total) by mouth daily. Start taking this after finish taper from high dose prednisone 90 tablet 3  . PredniSONE 10 MG KIT Taper from 86m po qd over 6 days. day1 677m then 5015mthen 47m2mhen 30mg78men20mg,44mn 10mg, 63m stop. 1 each 0   No current facility-administered medications on file prior to visit.    Allergies  Allergen Reactions  . Morphine Other (See Comments)    REACTION: nightmares  . Ambien [Zolpidem] Anxiety  . Cymbalta [Duloxetine Hcl] Anxiety  . Nitrofurantoin Rash    Review of Systems  Review of Systems  Constitutional: Negative for fever and malaise/fatigue.  HENT: Negative for congestion.   Eyes: Negative for discharge.  Respiratory: Positive for shortness of breath.   Cardiovascular: Negative for chest pain, palpitations and leg swelling.    Gastrointestinal: Negative for nausea, abdominal pain and diarrhea.  Genitourinary: Negative for dysuria.  Musculoskeletal: Negative for falls.  Skin: Negative for rash.  Neurological: Negative for loss of consciousness and headaches.  Endo/Heme/Allergies: Negative for polydipsia.  Psychiatric/Behavioral: Negative for depression and suicidal ideas. The patient is not nervous/anxious and does not have insomnia.  Objective  BP 146/72 mmHg  Pulse 79  Temp(Src) 98 F (36.7 C) (Oral)  Resp 16  Ht _0  (1.549 m)  Wt 97 lb 8 oz (44.226 kg)  BMI 18.43 kg/m2  SpO2 99%  Physical Exam  Physical Exam  Constitutional: She is oriented to person, place, and time and well-developed, well-nourished, and in no distress. No distress.  HENT:  Head: Normocephalic and atraumatic.  Eyes: Conjunctivae are normal.  Neck: Neck supple. No thyromegaly present.  Cardiovascular: Normal rate, regular rhythm and normal heart sounds.   No murmur heard. Pulmonary/Chest: Effort normal and breath sounds normal. She has no wheezes.  3l O2, prolonged expiratory phase.  Abdominal: She exhibits no distension and no mass.  Musculoskeletal: She exhibits no edema.  Lymphadenopathy:    She has no cervical adenopathy.  Neurological: She is alert and oriented to person, place, and time.  Skin: Skin is warm and dry. No rash noted. She is not diaphoretic.  Psychiatric: Memory, affect and judgment normal.    Lab Results  Component Value Date   TSH 1.504 11/12/2014   Lab Results  Component Value Date   WBC 8.8 11/14/2014   HGB 10.8* 11/14/2014   HCT 35.9* 11/14/2014   MCV 90.0 11/14/2014   PLT 280 11/14/2014   Lab Results  Component Value Date   CREATININE 0.50 11/14/2014   BUN 15 11/14/2014   NA 144 11/14/2014   K 4.1 11/14/2014   CL 102 11/14/2014   CO2 37* 11/14/2014   Lab Results  Component Value Date   ALT 31 11/14/2014   AST 30 11/14/2014   ALKPHOS 58 11/14/2014   BILITOT 0.5 11/14/2014    Lab Results  Component Value Date   CHOL 171 11/12/2014   Lab Results  Component Value Date   HDL 65 11/12/2014   Lab Results  Component Value Date   LDLCALC 29 11/12/2014   Lab Results  Component Value Date   TRIG 384* 11/12/2014   Lab Results  Component Value Date   CHOLHDL 2.6 11/12/2014     Assessment & Plan   COPD exacerbation Recently hospitalized with significant SOB after a bad reaction to macrobid. Breathing was significantly compromised but she is doing well now. No changes to meds.    GERD Avoid offending foods, start probiotics. Do not eat large meals in late evening and consider raising head of bed.    Anemia Resolved, WBC up due to recent steroid use.   Hypokalemia Mild, increase intake in diet and monitor   UTI (lower urinary tract infection) Symptoms resolved, urine clear, maintain adequate hdyration

## 2014-11-24 LAB — URINE CULTURE

## 2014-11-28 ENCOUNTER — Encounter: Payer: Self-pay | Admitting: Family Medicine

## 2014-11-28 DIAGNOSIS — E876 Hypokalemia: Secondary | ICD-10-CM | POA: Insufficient documentation

## 2014-11-28 NOTE — Assessment & Plan Note (Signed)
Avoid offending foods, start probiotics. Do not eat large meals in late evening and consider raising head of bed.  

## 2014-11-28 NOTE — Assessment & Plan Note (Signed)
Recently hospitalized with significant SOB after a bad reaction to macrobid. Breathing was significantly compromised but she is doing well now. No changes to meds.

## 2014-11-28 NOTE — Assessment & Plan Note (Signed)
Resolved, WBC up due to recent steroid use.

## 2014-11-28 NOTE — Assessment & Plan Note (Signed)
Symptoms resolved, urine clear, maintain adequate hdyration

## 2014-11-28 NOTE — Assessment & Plan Note (Signed)
Mild, increase intake in diet and monitor

## 2014-12-06 ENCOUNTER — Ambulatory Visit (INDEPENDENT_AMBULATORY_CARE_PROVIDER_SITE_OTHER): Payer: Medicare PPO | Admitting: Pulmonary Disease

## 2014-12-06 ENCOUNTER — Encounter: Payer: Self-pay | Admitting: Pulmonary Disease

## 2014-12-06 VITALS — BP 120/62 | HR 92 | Ht 61.0 in | Wt 98.0 lb

## 2014-12-06 DIAGNOSIS — J441 Chronic obstructive pulmonary disease with (acute) exacerbation: Secondary | ICD-10-CM

## 2014-12-06 DIAGNOSIS — J9611 Chronic respiratory failure with hypoxia: Secondary | ICD-10-CM

## 2014-12-06 MED ORDER — PREDNISONE 10 MG PO TABS: ORAL_TABLET | ORAL | Status: DC

## 2014-12-06 NOTE — Assessment & Plan Note (Signed)
Prednisone 10 mg tabs  Take 2 tabs daily with food x 10ds, then 1 tab daily with food x 10ds then drop to 5 mg daily Stay on Pulmicort and brovana

## 2014-12-06 NOTE — Assessment & Plan Note (Signed)
I'm worried that her headaches are due to hypercarbia during sleep She does desaturate on 2 L on walking but is okay at rest. Decrease oxygen to 2.5 L O2 on concentrator during sleep, OK to use 2L at rest & 3L on portable  Ok to take tylenol 500 mg every 6h (upto 3 tabs /day )

## 2014-12-06 NOTE — Progress Notes (Signed)
   Subjective:    Patient ID: NYJA WESTBROOK, female    DOB: December 20, 1940, 74 y.o.   MRN: [id number]  HPI  68 yowf , heavy ex- smoker, quit 2002 with GOLD D COPD FEV1 26% 6/10 on 24 h O2 since 2009.  -completed pulm rehab oct '11  Recurrent flares 2-3/yr requiring steroid bursts  She uses 3 liters 24/7. On a regimen of budesonide/brovana & alb/atrovent nebs   Significant tests/ events  Admitted 12/2012 for COPD exacerbation .ABG 7.39/55/75 on 3L [place]  She required low dose prednisone since her admit in 02/2013   Had colonoscopy with polyp removal 06/2013  08/2013 BL pneumonia  03/2014 ONO on 2L >> no sig desatn    12/06/2014  Chief Complaint  Patient presents with  . Follow-up    breathing has not done well since getting out of hospital; chest tightness; CAT score: 22   she was started treatment for UTI by her PCP because of dysuria with nitrofurantoin -developed worsening shortness of breath 7 admitted  CT chest 10/2014 neg PE Does not feel good since discharge - completed dosepak & down to 5 mg C/o head pounding in am -afraid to take tyelnol   Review of Systems neg for any significant sore throat, dysphagia, itching, sneezing, nasal congestion or excess/ purulent secretions, fever, chills, sweats, unintended wt loss, pleuritic or exertional cp, hempoptysis, orthopnea pnd or change in chronic leg swelling. Also denies presyncope, palpitations, heartburn, abdominal pain, nausea, vomiting, diarrhea or change in bowel or urinary habits, dysuria,hematuria, rash, arthralgias, visual complaints, headache, numbness weakness or ataxia.     Objective:   Physical Exam  Gen. Pleasant, well-nourished, in no distress ENT - no lesions, no post nasal drip Neck: No JVD, no thyromegaly, no carotid bruits Lungs: no use of accessory muscles, no dullness to percussion,decreased BL  without rales or rhonchi  Cardiovascular: Rhythm regular, heart sounds  normal, no murmurs or gallops, no  peripheral edema Musculoskeletal: No deformities, no cyanosis or clubbing         Assessment & Plan:

## 2014-12-06 NOTE — Patient Instructions (Addendum)
Prednisone 10 mg tabs  Take 2 tabs daily with food x 10ds, then 1 tab daily with food x 10ds then drop to 5 mg daily Decrease oxygen to 2.5 L O2 on concentrator during sleep, OK to use 2L at rest & 3L on portable  Ok to take tylenol 500 mg every 6h (upto 3 tabs /day )

## 2014-12-14 ENCOUNTER — Ambulatory Visit (INDEPENDENT_AMBULATORY_CARE_PROVIDER_SITE_OTHER): Payer: Medicare PPO | Admitting: Family Medicine

## 2014-12-14 ENCOUNTER — Encounter: Payer: Self-pay | Admitting: Family Medicine

## 2014-12-14 VITALS — HR 113 | Temp 98.5°F | Ht 61.0 in | Wt 96.0 lb

## 2014-12-14 DIAGNOSIS — A047 Enterocolitis due to Clostridium difficile: Secondary | ICD-10-CM

## 2014-12-14 DIAGNOSIS — I10 Essential (primary) hypertension: Secondary | ICD-10-CM

## 2014-12-14 DIAGNOSIS — R197 Diarrhea, unspecified: Secondary | ICD-10-CM

## 2014-12-14 DIAGNOSIS — K219 Gastro-esophageal reflux disease without esophagitis: Secondary | ICD-10-CM

## 2014-12-14 DIAGNOSIS — A0472 Enterocolitis due to Clostridium difficile, not specified as recurrent: Secondary | ICD-10-CM

## 2014-12-14 DIAGNOSIS — E039 Hypothyroidism, unspecified: Secondary | ICD-10-CM

## 2014-12-14 DIAGNOSIS — J449 Chronic obstructive pulmonary disease, unspecified: Secondary | ICD-10-CM

## 2014-12-14 MED ORDER — METOPROLOL SUCCINATE ER 25 MG PO TB24: 25.0000 mg | ORAL_TABLET | Freq: Every day | ORAL | Status: DC

## 2014-12-14 NOTE — Patient Instructions (Signed)
Nonspecific Tachycardia  Tachycardia is a faster than normal heartbeat (more than 100 beats per minute). In adults, the heart normally beats between 60 and 100 times a minute. A fast heartbeat may be a normal response to exercise or stress. It does not necessarily mean that something is wrong. However, sometimes when your heart beats too fast it may not be able to pump enough blood to the rest of your body. This can result in chest pain, shortness of breath, dizziness, and even fainting. Nonspecific tachycardia means that the specific cause or pattern of your tachycardia is unknown.  CAUSES   Tachycardia may be harmless or it may be due to a more serious underlying cause. Possible causes of tachycardia include:  · Exercise or exertion.  · Fever.  · Pain or injury.  · Infection.  · Loss of body fluids (dehydration).  · Overactive thyroid.  · Lack of red blood cells (anemia).  · Anxiety and stress.  · Alcohol.  · Caffeine.  · Tobacco products.  · Diet pills.  · Illegal drugs.  · Heart disease.  SYMPTOMS  · Rapid or irregular heartbeat (palpitations).  · Suddenly feeling your heart beating (cardiac awareness).  · Dizziness.  · Tiredness (fatigue).  · Shortness of breath.  · Chest pain.  · Nausea.  · Fainting.  DIAGNOSIS   Your caregiver will perform a physical exam and take your medical history. In some cases, a heart specialist (cardiologist) may be consulted. Your caregiver may also order:  · Blood tests.  · Electrocardiography. This test records the electrical activity of your heart.  · A heart monitoring test.  TREATMENT   Treatment will depend on the likely cause of your tachycardia. The goal is to treat the underlying cause of your tachycardia. Treatment methods may include:  · Replacement of fluids or blood through an intravenous (IV) tube for moderate to severe dehydration or anemia.  · New medicines or changes in your current medicines.  · Diet and lifestyle changes.  · Treatment for certain  infections.  · Stress relief or relaxation methods.  HOME CARE INSTRUCTIONS   · Rest.  · Drink enough fluids to keep your urine clear or pale yellow.  · Do not smoke.  · Avoid:  ¨ Caffeine.  ¨ Tobacco.  ¨ Alcohol.  ¨ Chocolate.  ¨ Stimulants such as over-the-counter diet pills or pills that help you stay awake.  ¨ Situations that cause anxiety or stress.  ¨ Illegal drugs such as marijuana, phencyclidine (PCP), and cocaine.  · Only take medicine as directed by your caregiver.  · Keep all follow-up appointments as directed by your caregiver.  SEEK IMMEDIATE MEDICAL CARE IF:   · You have pain in your chest, upper arms, jaw, or neck.  · You become weak, dizzy, or feel faint.  · You have palpitations that will not go away.  · You vomit, have diarrhea, or pass blood in your stool.  · Your skin is cool, pale, and wet.  · You have a fever that will not go away with rest, fluids, and medicine.  MAKE SURE YOU:   · Understand these instructions.  · Will watch your condition.  · Will get help right away if you are not doing well or get worse.  Document Released: 11/22/2004 Document Revised: 01/07/2012 Document Reviewed: 09/25/2011  Exit[employer]® Patient Information ©2015 [employer]. This information is not intended to replace advice given to you by your health care provider. Make sure you discuss any questions   you have with your health care provider.

## 2014-12-14 NOTE — Progress Notes (Signed)
Pre visit review using our clinic review tool, if applicable. No additional management support is needed unless otherwise documented below in the visit note. 

## 2014-12-16 ENCOUNTER — Telehealth: Payer: Self-pay | Admitting: Family Medicine

## 2014-12-16 ENCOUNTER — Other Ambulatory Visit: Payer: Self-pay | Admitting: Family Medicine

## 2014-12-16 ENCOUNTER — Telehealth: Payer: Self-pay | Admitting: *Deleted

## 2014-12-16 LAB — CLOSTRIDIUM DIFFICILE BY PCR: Toxigenic C. Difficile by PCR: DETECTED — CR

## 2014-12-16 MED ORDER — VANCOMYCIN HCL 125 MG PO CAPS: 125.0000 mg | ORAL_CAPSULE | Freq: Four times a day (QID) | ORAL | Status: DC

## 2014-12-16 NOTE — Telephone Encounter (Signed)
Pulse Oximetry report shows No desaturation.  Pt is to stay on 2.5L Oxygen per RA.

## 2014-12-16 NOTE — Telephone Encounter (Signed)
Critical Lab Result C-diff positive

## 2014-12-16 NOTE — Telephone Encounter (Signed)
Treated with Vancomyin

## 2014-12-16 NOTE — Telephone Encounter (Signed)
Prescription sent into pharmacy

## 2014-12-16 NOTE — Telephone Encounter (Signed)
Patient notified of Pulse Oximetry results.  Patient says she has been using 2.5L of oxygen every since study and has not had any headaches since.  She says she feels great.  FYI to Dr. Elsworth Soho.

## 2014-12-17 LAB — OVA AND PARASITE EXAMINATION: OP: NONE SEEN

## 2014-12-17 LAB — FECAL LACTOFERRIN, QUANT: LACTOFERRIN: NEGATIVE

## 2014-12-19 LAB — STOOL CULTURE

## 2014-12-20 ENCOUNTER — Other Ambulatory Visit: Payer: Self-pay | Admitting: Family Medicine

## 2014-12-20 MED ORDER — FLUCONAZOLE 150 MG PO TABS: 150.0000 mg | ORAL_TABLET | ORAL | Status: DC

## 2014-12-27 ENCOUNTER — Encounter: Payer: Self-pay | Admitting: Family Medicine

## 2014-12-27 DIAGNOSIS — A0472 Enterocolitis due to Clostridium difficile, not specified as recurrent: Secondary | ICD-10-CM

## 2014-12-27 HISTORY — DX: Enterocolitis due to Clostridium difficile, not specified as recurrent: A04.72

## 2014-12-27 NOTE — Assessment & Plan Note (Signed)
With tachycardia. Started on Metoprolol XL 25 mg daily

## 2014-12-27 NOTE — Progress Notes (Signed)
[name]  [id number] 15-Jan-1941 12/27/2014      Progress Note-Follow Up  Subjective  Chief Complaint  Chief Complaint  Patient presents with  . Follow-up    HPI  Patient is a 74 y.o. female in today for routine medical care. Patient is in today for follow-up. Breathing is essentially returned to normal. She continues to struggle shortness of breath and needed oxygen but she is feeling much better. Her appetite has improved although she is acknowledging 2-4 loose stool daily. Fevers or chills. No significant abdominal pain or anorexia. No urinary complaints. Denies chest pain, palpitations.  Past Medical History  Diagnosis Date  . COPD (chronic obstructive pulmonary disease)   . Thyroid disease   . Glucose intolerance (impaired glucose tolerance)   . History of small bowel obstruction   . Arthralgia   . Dyspnea   . Pallor   . Chest pain   . Osteoporosis   . Dysuria   . Weight loss   . Edema   . Weakness   . Anxiety   . Depression   . Migraine   . Hypertension   . Leukocytosis   . GERD (gastroesophageal reflux disease)   . Atrophic vaginitis 12/07/2012  . Hx of adenomatous colonic polyps   . Ischemic colitis   . Nocturia 09/02/2013  . Diarrhea 09/02/2013  . Hyperglycemia 12/01/2013  . Hypokalemia 11/28/2014  . Clostridium difficile diarrhea 12/27/2014    Past Surgical History  Procedure Laterality Date  . Cholecystectomy    . Abdominal hysterectomy  1993  . Tubal ligation    . Bladder repair    . Dilation and curettage of uterus      40 yrs ago  . Eye surgery  04/2011    bil. eyes  . Colonoscopy with propofol N/A 06/08/2013    Procedure: COLONOSCOPY WITH PROPOFOL;  Surgeon: Lafayette Dragon, MD;  Location: [facility];  Service: Endoscopy;  Laterality: N/A;    Family History  Problem Relation Age of Onset  . Alcohol abuse Mother   . Heart disease Mother   . Stroke Mother   . Hypertension Mother   . Kidney disease Mother   . COPD Mother   .  Osteoporosis Mother     hip fracture  . COPD Brother   . Alcohol abuse Brother     drug abuse  . Hearing loss Brother     mvp  . Colon cancer Paternal Aunt   . Prostate cancer    . Alcohol abuse Father     MVA  . Thyroid disease Daughter   . COPD Paternal Grandfather   . COPD Sister   . Hypertension Sister   . Anxiety disorder Sister   . COPD Sister   . COPD Brother     current smoker  . Hearing loss Brother     cad  . COPD Brother     History   Social History  . Marital Status: Married    Spouse Name: N/A  . Number of Children: 1  . Years of Education: N/A   Occupational History  . Retired Hydrologist    Social History Main Topics  . Smoking status: Former Smoker -- 2.00 packs/day for 40 years    Types: Cigarettes    Quit date: 10/29/2001  . Smokeless tobacco: Never Used  . Alcohol Use: No  . Drug Use: No  . Sexual Activity: Not on file   Other Topics Concern  . Not on file  Social History Narrative    Current Outpatient Prescriptions on File Prior to Visit  Medication Sig Dispense Refill  . albuterol (PROVENTIL HFA;VENTOLIN HFA) 108 (90 BASE) MCG/ACT inhaler Inhale 2 puffs into the lungs every 6 (six) hours as needed for wheezing or shortness of breath. 3 Inhaler 3  . albuterol (PROVENTIL) (2.5 MG/3ML) 0.083% nebulizer solution Take 3 mLs (2.5 mg total) by nebulization every 4 (four) hours as needed for wheezing or shortness of breath. Dx 496 300 mL 3  . ALPRAZolam (XANAX) 0.5 MG tablet 1 tab po bid and 2 tabs po qhs prn anxiety, insomnia (Patient taking differently: Take 0.5-1.25 mg by mouth 3 (three) times daily. Takes 1 tablet twice daily, then takes 1.5 tablets at bedtime) 360 tablet 1  . arformoterol (BROVANA) 15 MCG/2ML NEBU Take 2 mLs (15 mcg total) by nebulization 2 (two) times daily. Dx 496 360 mL 3  . Ascorbic Acid (VITAMIN C ER PO) Take 1 Package by mouth daily.     . budesonide (PULMICORT) 0.25 MG/2ML nebulizer solution Take 2  mLs (0.25 mg total) by nebulization 2 (two) times daily. Dx: 496 360 mL 3  . Calcium Carbonate-Vitamin D 600-400 MG-UNIT per tablet Take 1 tablet by mouth 2 (two) times daily.      . citalopram (CELEXA) 20 MG tablet Take 1 tablet (20 mg total) by mouth daily. (Patient taking differently: Take 10 mg by mouth daily. ) 90 tablet 1  . Ferrous Fumarate-Folic Acid (HEMOCYTE-F) 324-1 MG TABS Take 1 tablet by mouth daily. 30 each 5  . fluticasone (FLONASE) 50 MCG/ACT nasal spray Place 2 sprays into both nostrils 2 (two) times daily.    . Glucosamine-Chondroitin (GLUCOSAMINE CHONDR COMPLEX PO) Take 1 tablet by mouth 2 (two) times daily.     Marland Kitchen ipratropium-albuterol (DUONEB) 0.5-2.5 (3) MG/3ML SOLN Take 3 mLs by nebulization every 6 (six) hours as needed. 360 mL 3  . mirtazapine (REMERON) 15 MG tablet Take 1 tablet (15 mg total) by mouth at bedtime. 90 tablet 1  . omeprazole-sodium bicarbonate (ZEGERID) 40-1100 MG per capsule Take 1 capsule by mouth 2 (two) times daily. 180 capsule 1  . predniSONE (DELTASONE) 10 MG tablet Take 0.5 tablets (5 mg total) by mouth daily. Start taking this after finish taper from high dose prednisone (Patient taking differently: Take 5 mg by mouth daily. 5mg ; Monday, Wednesday and Friday only) 90 tablet 3  . predniSONE (DELTASONE) 10 MG tablet Take 2 tablets w/ food x 10 days, then 1 tablet x 10 days, then 5mg  daily 60 tablet 0   No current facility-administered medications on file prior to visit.    Allergies  Allergen Reactions  . Morphine Other (See Comments)    REACTION: nightmares  . Ambien [Zolpidem] Anxiety  . Cymbalta [Duloxetine Hcl] Anxiety  . Nitrofurantoin Rash    Review of Systems  Review of Systems  Constitutional: Negative for fever and malaise/fatigue.  HENT: Negative for congestion.   Eyes: Negative for discharge.  Respiratory: Positive for shortness of breath.   Cardiovascular: Negative for chest pain, palpitations and leg swelling.    Gastrointestinal: Positive for diarrhea. Negative for nausea and abdominal pain.  Genitourinary: Negative for dysuria.  Musculoskeletal: Negative for falls.  Skin: Negative for rash.  Neurological: Negative for loss of consciousness and headaches.  Endo/Heme/Allergies: Negative for polydipsia.  Psychiatric/Behavioral: Negative for depression and suicidal ideas. The patient is not nervous/anxious and does not have insomnia.     Objective  Pulse 113  Temp(Src) 98.5  F (36.9 C) (Oral)  Ht 5\' 1"  (1.549 m)  Wt 96 lb (43.545 kg)  BMI 18.15 kg/m2  SpO2 98%  Physical Exam  Physical Exam  Constitutional: She is oriented to person, place, and time and well-developed, well-nourished, and in no distress. No distress.  HENT:  Head: Normocephalic and atraumatic.  Eyes: Conjunctivae are normal.  Neck: Neck supple. No thyromegaly present.  Cardiovascular: Normal rate, regular rhythm and normal heart sounds.   No murmur heard. Pulmonary/Chest: Effort normal and breath sounds normal. She has no wheezes.  Abdominal: She exhibits no distension and no mass.  Musculoskeletal: She exhibits no edema.  Lymphadenopathy:    She has no cervical adenopathy.  Neurological: She is alert and oriented to person, place, and time.  Skin: Skin is warm and dry. No rash noted. She is not diaphoretic.  Psychiatric: Memory, affect and judgment normal.    Lab Results  Component Value Date   TSH 1.504 11/12/2014   Lab Results  Component Value Date   WBC 13.7* 11/23/2014   HGB 12.2 11/23/2014   HCT 37.9 11/23/2014   MCV 86.9 11/23/2014   PLT 382.0 11/23/2014   Lab Results  Component Value Date   CREATININE 0.53 11/23/2014   BUN 24* 11/23/2014   NA 142 11/23/2014   K 3.4* 11/23/2014   CL 99 11/23/2014   CO2 37* 11/23/2014   Lab Results  Component Value Date   ALT 38* 11/23/2014   AST 23 11/23/2014   ALKPHOS 46 11/23/2014   BILITOT 0.3 11/23/2014   Lab Results  Component Value Date   CHOL  171 11/12/2014   Lab Results  Component Value Date   HDL 65 11/12/2014   Lab Results  Component Value Date   LDLCALC 29 11/12/2014   Lab Results  Component Value Date   TRIG 384* 11/12/2014   Lab Results  Component Value Date   CHOLHDL 2.6 11/12/2014     Assessment & Plan  HYPERTENSION, BENIGN ESSENTIAL With tachycardia. Started on Metoprolol XL 25 mg daily   GERD Avoid offending foods, start probiotics. Do not eat large meals in late evening and consider raising head of bed.    Hypothyroidism On Levothyroxine, continue to monitor   Clostridium difficile diarrhea Patient reported diarrhea after antibiotic use and her stool tested positive for C Diff, was started on Vancomycin and probiotics   COPD (chronic obstructive pulmonary disease) Is O2 dependent but has returned to her baseline and over all she is feeling better.

## 2014-12-27 NOTE — Assessment & Plan Note (Signed)
Is O2 dependent but has returned to her baseline and over all she is feeling better.

## 2014-12-27 NOTE — Assessment & Plan Note (Signed)
Avoid offending foods, start probiotics. Do not eat large meals in late evening and consider raising head of bed.  

## 2014-12-27 NOTE — Assessment & Plan Note (Signed)
On Levothyroxine, continue to monitor 

## 2014-12-27 NOTE — Assessment & Plan Note (Signed)
Patient reported diarrhea after antibiotic use and her stool tested positive for C Diff, was started on Vancomycin and probiotics

## 2014-12-31 ENCOUNTER — Encounter: Payer: Self-pay | Admitting: Pulmonary Disease

## 2014-12-31 ENCOUNTER — Encounter: Payer: Self-pay | Admitting: Family Medicine

## 2014-12-31 DIAGNOSIS — J449 Chronic obstructive pulmonary disease, unspecified: Secondary | ICD-10-CM

## 2015-01-02 ENCOUNTER — Other Ambulatory Visit: Payer: Self-pay | Admitting: Family Medicine

## 2015-01-02 DIAGNOSIS — F329 Major depressive disorder, single episode, unspecified: Secondary | ICD-10-CM

## 2015-01-02 DIAGNOSIS — F419 Anxiety disorder, unspecified: Principal | ICD-10-CM

## 2015-01-02 MED ORDER — VANCOMYCIN HCL 125 MG PO CAPS
125.0000 mg | ORAL_CAPSULE | Freq: Four times a day (QID) | ORAL | Status: DC
Start: 2015-01-02 — End: 2015-02-11

## 2015-01-02 MED ORDER — ALPRAZOLAM 0.5 MG PO TABS: ORAL_TABLET | ORAL | Status: DC

## 2015-01-02 NOTE — Progress Notes (Signed)
I have sent her in a refill course of Vancomycin, if she does not improve with that we will need to repeat CDiff PCR in next 1-2 weeks. I have signed her Alprazolam her MYChart message for where to forward the prescription

## 2015-01-03 ENCOUNTER — Other Ambulatory Visit: Payer: Self-pay | Admitting: *Deleted

## 2015-01-03 DIAGNOSIS — J449 Chronic obstructive pulmonary disease, unspecified: Secondary | ICD-10-CM

## 2015-01-03 MED ORDER — ALBUTEROL SULFATE (2.5 MG/3ML) 0.083% IN NEBU: 2.5000 mg | INHALATION_SOLUTION | RESPIRATORY_TRACT | Status: DC | PRN

## 2015-01-03 MED ORDER — IPRATROPIUM-ALBUTEROL 0.5-2.5 (3) MG/3ML IN SOLN: 3.0000 mL | Freq: Four times a day (QID) | RESPIRATORY_TRACT | Status: DC | PRN

## 2015-01-06 ENCOUNTER — Encounter: Payer: Self-pay | Admitting: Pulmonary Disease

## 2015-01-29 ENCOUNTER — Encounter: Payer: Self-pay | Admitting: Family Medicine

## 2015-01-31 ENCOUNTER — Other Ambulatory Visit: Payer: Self-pay | Admitting: Family Medicine

## 2015-01-31 DIAGNOSIS — F329 Major depressive disorder, single episode, unspecified: Secondary | ICD-10-CM

## 2015-01-31 DIAGNOSIS — F419 Anxiety disorder, unspecified: Principal | ICD-10-CM

## 2015-01-31 MED ORDER — CITALOPRAM HYDROBROMIDE 20 MG PO TABS: 10.0000 mg | ORAL_TABLET | Freq: Every day | ORAL | Status: DC

## 2015-02-03 ENCOUNTER — Ambulatory Visit (INDEPENDENT_AMBULATORY_CARE_PROVIDER_SITE_OTHER): Payer: Medicare PPO | Admitting: Family Medicine

## 2015-02-03 VITALS — BP 126/69 | HR 91 | Temp 98.1°F | Resp 16 | Ht 61.0 in | Wt 96.8 lb

## 2015-02-03 DIAGNOSIS — A047 Enterocolitis due to Clostridium difficile: Secondary | ICD-10-CM

## 2015-02-03 DIAGNOSIS — J449 Chronic obstructive pulmonary disease, unspecified: Secondary | ICD-10-CM | POA: Diagnosis not present

## 2015-02-03 DIAGNOSIS — R109 Unspecified abdominal pain: Secondary | ICD-10-CM | POA: Diagnosis not present

## 2015-02-03 DIAGNOSIS — A0472 Enterocolitis due to Clostridium difficile, not specified as recurrent: Secondary | ICD-10-CM

## 2015-02-03 DIAGNOSIS — I10 Essential (primary) hypertension: Secondary | ICD-10-CM

## 2015-02-03 DIAGNOSIS — F418 Other specified anxiety disorders: Secondary | ICD-10-CM | POA: Diagnosis not present

## 2015-02-03 DIAGNOSIS — F329 Major depressive disorder, single episode, unspecified: Secondary | ICD-10-CM

## 2015-02-03 DIAGNOSIS — F419 Anxiety disorder, unspecified: Principal | ICD-10-CM

## 2015-02-03 MED ORDER — METOPROLOL SUCCINATE ER 25 MG PO TB24: 25.0000 mg | ORAL_TABLET | Freq: Every day | ORAL | Status: DC

## 2015-02-03 MED ORDER — CITALOPRAM HYDROBROMIDE 20 MG PO TABS: 10.0000 mg | ORAL_TABLET | Freq: Every day | ORAL | Status: DC

## 2015-02-03 NOTE — Progress Notes (Signed)
Pre visit review using our clinic review tool, if applicable. No additional management support is needed unless otherwise documented below in the visit note. 

## 2015-02-03 NOTE — Patient Instructions (Signed)
Clostridium Difficile Infection °Clostridium difficile (C. difficile) is a bacteria found in the intestinal tract or colon. Under certain conditions, it causes diarrhea and sometimes severe disease. The severe form of the disease is known as pseudomembranous colitis (often called C. difficile colitis). This disease can damage the lining of the colon or cause the colon to become enlarged (toxic megacolon). °CAUSES °Your colon normally contains many different bacteria, including C. difficile. The balance of bacteria in your colon can change during illness. This is especially true when you take antibiotic medicine. Taking antibiotics may allow the C. difficile to grow, multiply excessively, and make a toxin that then causes illness. The elderly and people with certain medical conditions have a greater risk of getting C. difficile infections. °SYMPTOMS °· Watery diarrhea. °· Fever. °· Fatigue. °· Loss of appetite. °· Nausea. °· Abdominal swelling, pain, or tenderness. °· Dehydration. °DIAGNOSIS °Your symptoms may make your caregiver suspect a C. difficile infection, especially if you have used antibiotics in the preceding weeks. However, there are only 2 ways to know for certain whether you have a C. difficile infection: °· A lab test that finds the toxin in your stool. °· The specific appearance of an abnormality (pseudomembrane) in your colon. This can only be seen by doing a sigmoidoscopy or colonoscopy. These procedures involve passing an instrument through your rectum to look at the inside of your colon. °Your caregiver will help determine if these tests are necessary. °TREATMENT °· Most people are successfully treated with one of two specific antibiotics, usually given by mouth. Other antibiotics you are receiving are stopped if possible. °· Intravenous (IV) fluids and correction of electrolyte imbalance may be necessary. °· Rarely, surgery may be needed to remove the infected part of the intestines. °· Careful  hand washing by you and your caregivers is important to prevent the spread of infection. In the hospital, your caregivers may also put on gowns and gloves to prevent the spread of the C. difficile bacteria. Your room is also cleaned regularly with a solution containing bleach or a product that is known to kill C. difficile. °HOME CARE INSTRUCTIONS °· Drink enough fluids to keep your urine clear or pale yellow. Avoid milk, caffeine, and alcohol. °· Ask your caregiver for specific rehydration instructions. °· Try eating small, frequent meals rather than large meals. °· Take your antibiotics as directed. Finish them even if you start to feel better. °· Do not use medicines to slow diarrhea. This could delay healing or cause complications. °· Wash your hands thoroughly after using the bathroom and before preparing food. °· Make sure people who live with you wash their hands often, too. °· Carefully disinfect all surfaces with a product that contains chlorine bleach. °SEEK MEDICAL CARE IF: °· Diarrhea persists longer than expected or recurs after completing your course of antibiotic treatment for the C. difficile infection. °· You have trouble staying hydrated. °SEEK IMMEDIATE MEDICAL CARE IF: °· You develop a new fever. °· You have increasing abdominal pain or tenderness. °· There is blood in your stools, or your stools are dark black and tarry. °· You cannot hold down food or liquids. °MAKE SURE YOU: °· Understand these instructions. °· Will watch your condition. °· Will get help right away if you are not doing well or get worse. °Document Released: 07/25/2005 Document Revised: 03/01/2014 Document Reviewed: 03/23/2011 °[employer]® Patient Information ©2015 [employer]. This information is not intended to replace advice given to you by your health care provider. Make sure you   discuss any questions you have with your health care provider. ° °

## 2015-02-04 LAB — COMPREHENSIVE METABOLIC PANEL
ALT: 13 U/L (ref 0–35)
AST: 20 U/L (ref 0–37)
Albumin: 4 g/dL (ref 3.5–5.2)
Alkaline Phosphatase: 50 U/L (ref 39–117)
BILIRUBIN TOTAL: 0.3 mg/dL (ref 0.2–1.2)
BUN: 26 mg/dL — AB (ref 6–23)
CO2: 35 meq/L — AB (ref 19–32)
CREATININE: 0.48 mg/dL (ref 0.40–1.20)
Calcium: 9.2 mg/dL (ref 8.4–10.5)
Chloride: 98 mEq/L (ref 96–112)
GFR: 134.91 mL/min (ref >60.00)
GLUCOSE: 118 mg/dL — AB (ref 70–99)
Potassium: 4.5 mEq/L (ref 3.5–5.1)
Sodium: 140 mEq/L (ref 135–145)
Total Protein: 6.8 g/dL (ref 6.0–8.3)

## 2015-02-04 LAB — URINALYSIS
BILIRUBIN URINE: NEGATIVE
Ketones, ur: NEGATIVE
Leukocytes, UA: NEGATIVE
Nitrite: NEGATIVE
SPECIFIC GRAVITY, URINE: 1.025 (ref 1.000–1.030)
TOTAL PROTEIN, URINE-UPE24: NEGATIVE
URINE GLUCOSE: NEGATIVE
Urobilinogen, UA: 0.2 (ref 0.0–1.0)
pH: 6 (ref 5.0–8.0)

## 2015-02-04 LAB — CBC
HCT: 41.9 % (ref 36.0–46.0)
Hemoglobin: 13.8 g/dL (ref 12.0–15.0)
MCHC: 32.9 g/dL (ref 30.0–36.0)
MCV: 88.5 fl (ref 78.0–100.0)
PLATELETS: 330 10*3/uL (ref 150.0–400.0)
RBC: 4.73 Mil/uL (ref 3.87–5.11)
RDW: 16.6 % — ABNORMAL HIGH (ref 11.5–15.5)
WBC: 11.5 10*3/uL — AB (ref 4.0–10.5)

## 2015-02-04 LAB — TSH: TSH: 1.21 u[IU]/mL (ref 0.35–4.50)

## 2015-02-07 NOTE — Assessment & Plan Note (Signed)
Well controlled, no changes to meds. Encouraged heart healthy diet such as the DASH diet and exercise as tolerated.  °

## 2015-02-07 NOTE — Assessment & Plan Note (Signed)
Diarrhea improved but not completely. Will recheck for C Diff and yeast before retreating. Encouraged probiotics and minimize simple carbs

## 2015-02-07 NOTE — Progress Notes (Signed)
[name]  [id number] 1941-02-08 02/07/2015      Progress Note-Follow Up  Subjective  Chief Complaint  Chief Complaint  Patient presents with  . Follow-up    HPI  Patient is a 74 y.o. female in today for routine medical care. Patient is in today for follow-up. Reports her diarrhea greatly improved after treatment with vancomycin and Diflucan for her C. difficile and her [place] but did not fully resolve. No fevers or chills. No abdominal pain but does note a couple of loose stool each day. No bloody or tarry stool. No nausea or vomiting. Her breathing is fairly stable and she is doing well on her 3 L of O2. Denies CP/palp/SOB/HA/congestion/fevers or GU c/o. Taking meds as prescribed  Past Medical History  Diagnosis Date  . COPD (chronic obstructive pulmonary disease)   . Thyroid disease   . Glucose intolerance (impaired glucose tolerance)   . History of small bowel obstruction   . Arthralgia   . Dyspnea   . Pallor   . Chest pain   . Osteoporosis   . Dysuria   . Weight loss   . Edema   . Weakness   . Anxiety   . Depression   . Migraine   . Hypertension   . Leukocytosis   . GERD (gastroesophageal reflux disease)   . Atrophic vaginitis 12/07/2012  . Hx of adenomatous colonic polyps   . Ischemic colitis   . Nocturia 09/02/2013  . Diarrhea 09/02/2013  . Hyperglycemia 12/01/2013  . Hypokalemia 11/28/2014  . Clostridium difficile diarrhea 12/27/2014    Past Surgical History  Procedure Laterality Date  . Cholecystectomy    . Abdominal hysterectomy  1993  . Tubal ligation    . Bladder repair    . Dilation and curettage of uterus      40 yrs ago  . Eye surgery  04/2011    bil. eyes  . Colonoscopy with propofol N/A 06/08/2013    Procedure: COLONOSCOPY WITH PROPOFOL;  Surgeon: Lafayette Dragon, MD;  Location: [facility];  Service: Endoscopy;  Laterality: N/A;    Family History  Problem Relation Age of Onset  . Alcohol abuse Mother   . Heart disease Mother   .  Stroke Mother   . Hypertension Mother   . Kidney disease Mother   . COPD Mother   . Osteoporosis Mother     hip fracture  . COPD Brother   . Alcohol abuse Brother     drug abuse  . Hearing loss Brother     mvp  . Colon cancer Paternal Aunt   . Prostate cancer    . Alcohol abuse Father     MVA  . Thyroid disease Daughter   . COPD Paternal Grandfather   . COPD Sister   . Hypertension Sister   . Anxiety disorder Sister   . COPD Sister   . COPD Brother     current smoker  . Hearing loss Brother     cad  . COPD Brother     History   Social History  . Marital Status: Married    Spouse Name: N/A  . Number of Children: 1  . Years of Education: N/A   Occupational History  . Retired Hydrologist    Social History Main Topics  . Smoking status: Former Smoker -- 2.00 packs/day for 40 years    Types: Cigarettes    Quit date: 10/29/2001  . Smokeless tobacco: Never Used  . Alcohol  Use: No  . Drug Use: No  . Sexual Activity: Not on file   Other Topics Concern  . Not on file   Social History Narrative    Current Outpatient Prescriptions on File Prior to Visit  Medication Sig Dispense Refill  . albuterol (PROVENTIL HFA;VENTOLIN HFA) 108 (90 BASE) MCG/ACT inhaler Inhale 2 puffs into the lungs every 6 (six) hours as needed for wheezing or shortness of breath. 3 Inhaler 3  . albuterol (PROVENTIL) (2.5 MG/3ML) 0.083% nebulizer solution Take 3 mLs (2.5 mg total) by nebulization every 4 (four) hours as needed for wheezing or shortness of breath. Dx 496 300 mL 0  . ALPRAZolam (XANAX) 0.5 MG tablet 1 tab po bid and 2 tabs po qhs prn anxiety, insomnia 360 tablet 1  . arformoterol (BROVANA) 15 MCG/2ML NEBU Take 2 mLs (15 mcg total) by nebulization 2 (two) times daily. Dx 496 360 mL 3  . Ascorbic Acid (VITAMIN C ER PO) Take 1 Package by mouth daily.     . budesonide (PULMICORT) 0.25 MG/2ML nebulizer solution Take 2 mLs (0.25 mg total) by nebulization 2 (two) times  daily. Dx: 496 360 mL 3  . Calcium Carbonate-Vitamin D 600-400 MG-UNIT per tablet Take 1 tablet by mouth 2 (two) times daily.      . Ferrous Fumarate-Folic Acid (HEMOCYTE-F) 324-1 MG TABS Take 1 tablet by mouth daily. 30 each 5  . fluconazole (DIFLUCAN) 150 MG tablet Take 1 tablet (150 mg total) by mouth once a week. For 4 weeks. 4 tablet 0  . fluticasone (FLONASE) 50 MCG/ACT nasal spray Place 2 sprays into both nostrils 2 (two) times daily.    . Glucosamine-Chondroitin (GLUCOSAMINE CHONDR COMPLEX PO) Take 1 tablet by mouth 2 (two) times daily.     Marland Kitchen ipratropium-albuterol (DUONEB) 0.5-2.5 (3) MG/3ML SOLN Take 3 mLs by nebulization every 6 (six) hours as needed. 360 mL 0  . mirtazapine (REMERON) 15 MG tablet Take 1 tablet (15 mg total) by mouth at bedtime. 90 tablet 1  . omeprazole-sodium bicarbonate (ZEGERID) 40-1100 MG per capsule Take 1 capsule by mouth 2 (two) times daily. 180 capsule 1  . predniSONE (DELTASONE) 10 MG tablet Take 0.5 tablets (5 mg total) by mouth daily. Start taking this after finish taper from high dose prednisone (Patient taking differently: Take 5 mg by mouth daily. 5mg ; Monday, Wednesday and Friday only) 90 tablet 3  . predniSONE (DELTASONE) 10 MG tablet Take 2 tablets w/ food x 10 days, then 1 tablet x 10 days, then 5mg  daily 60 tablet 0  . vancomycin (VANCOCIN) 125 MG capsule Take 1 capsule (125 mg total) by mouth 4 (four) times daily. For 14 days 56 capsule 0   No current facility-administered medications on file prior to visit.    Allergies  Allergen Reactions  . Morphine Other (See Comments)    REACTION: nightmares  . Ambien [Zolpidem] Anxiety  . Cymbalta [Duloxetine Hcl] Anxiety  . Nitrofurantoin Rash    Review of Systems  Review of Systems  Constitutional: Negative for fever and malaise/fatigue.  HENT: Negative for congestion.   Eyes: Negative for discharge.  Respiratory: Positive for shortness of breath.   Cardiovascular: Negative for chest pain,  palpitations and leg swelling.  Gastrointestinal: Positive for diarrhea. Negative for nausea and abdominal pain.  Genitourinary: Negative for dysuria.  Musculoskeletal: Negative for falls.  Skin: Negative for rash.  Neurological: Negative for loss of consciousness and headaches.  Endo/Heme/Allergies: Negative for polydipsia.  Psychiatric/Behavioral: Negative for depression and suicidal  ideas. The patient is not nervous/anxious and does not have insomnia.     Objective  BP 126/69 mmHg  Pulse 91  Temp(Src) 98.1 F (36.7 C) (Oral)  Resp 16  Ht 5\' 1"  (1.549 m)  Wt 96 lb 12.8 oz (43.908 kg)  BMI 18.30 kg/m2  SpO2 99%  Physical Exam  Physical Exam  Constitutional: She is oriented to person, place, and time and well-developed, well-nourished, and in no distress. No distress.  HENT:  Head: Normocephalic and atraumatic.  Eyes: Conjunctivae are normal.  Neck: Neck supple. No thyromegaly present.  Cardiovascular: Normal rate, regular rhythm and normal heart sounds.   No murmur heard. Pulmonary/Chest: Effort normal and breath sounds normal. She has no wheezes.  Abdominal: She exhibits no distension and no mass.  Musculoskeletal: She exhibits no edema.  Lymphadenopathy:    She has no cervical adenopathy.  Neurological: She is alert and oriented to person, place, and time.  Skin: Skin is warm and dry. No rash noted. She is not diaphoretic.  Psychiatric: Memory, affect and judgment normal.    Lab Results  Component Value Date   TSH 1.21 02/03/2015   Lab Results  Component Value Date   WBC 11.5* 02/03/2015   HGB 13.8 02/03/2015   HCT 41.9 02/03/2015   MCV 88.5 02/03/2015   PLT 330.0 02/03/2015   Lab Results  Component Value Date   CREATININE 0.48 02/03/2015   BUN 26* 02/03/2015   NA 140 02/03/2015   K 4.5 02/03/2015   CL 98 02/03/2015   CO2 35* 02/03/2015   Lab Results  Component Value Date   ALT 13 02/03/2015   AST 20 02/03/2015   ALKPHOS 50 02/03/2015   BILITOT  0.3 02/03/2015   Lab Results  Component Value Date   CHOL 171 11/12/2014   Lab Results  Component Value Date   HDL 65 11/12/2014   Lab Results  Component Value Date   LDLCALC 29 11/12/2014   Lab Results  Component Value Date   TRIG 384* 11/12/2014   Lab Results  Component Value Date   CHOLHDL 2.6 11/12/2014     Assessment & Plan  HYPERTENSION, BENIGN ESSENTIAL Well controlled, no changes to meds. Encouraged heart healthy diet such as the DASH diet and exercise as tolerated.    COPD (chronic obstructive pulmonary disease) Stable on 3 L O2   Clostridium difficile diarrhea Diarrhea improved but not completely. Will recheck for C Diff and yeast before retreating. Encouraged probiotics and minimize simple carbs

## 2015-02-07 NOTE — Assessment & Plan Note (Signed)
Stable on 3 L O2

## 2015-02-10 ENCOUNTER — Encounter: Payer: Self-pay | Admitting: Pulmonary Disease

## 2015-02-10 ENCOUNTER — Ambulatory Visit (INDEPENDENT_AMBULATORY_CARE_PROVIDER_SITE_OTHER): Payer: Medicare PPO | Admitting: Pulmonary Disease

## 2015-02-10 VITALS — BP 127/73 | HR 73 | Ht 63.0 in | Wt 92.0 lb

## 2015-02-10 DIAGNOSIS — A047 Enterocolitis due to Clostridium difficile: Secondary | ICD-10-CM | POA: Diagnosis not present

## 2015-02-10 DIAGNOSIS — F418 Other specified anxiety disorders: Secondary | ICD-10-CM | POA: Diagnosis not present

## 2015-02-10 DIAGNOSIS — J9611 Chronic respiratory failure with hypoxia: Secondary | ICD-10-CM

## 2015-02-10 DIAGNOSIS — J449 Chronic obstructive pulmonary disease, unspecified: Secondary | ICD-10-CM | POA: Diagnosis not present

## 2015-02-10 DIAGNOSIS — R109 Unspecified abdominal pain: Secondary | ICD-10-CM | POA: Diagnosis not present

## 2015-02-10 NOTE — Patient Instructions (Signed)
Decrease to 2L oxygen during sleep  OK to use 3L when walking Use flonase April -may - can drop to every other day in June , then stop

## 2015-02-10 NOTE — Progress Notes (Signed)
   Subjective:    Patient ID: [name], female    DOB: [birth year], 74 y.o.   MRN: [id number]  HPI   21 yowf , heavy ex- smoker, quit 2002 with GOLD D COPD FEV1 26% 6/10 on 24 h O2 since 2009.  -completed pulm rehab oct '11  Recurrent flares 2-3/yr requiring steroid bursts  She uses 3 liters 24/7. On a regimen of budesonide/brovana & alb/atrovent nebs   Significant tests/ events  Admitted 12/2012 for COPD exacerbation .ABG 7.39/55/75 on 3L [place]  She required low dose prednisone since her admit in 02/2013   Had colonoscopy with polyp removal 06/2013  08/2013 BL pneumonia  03/2014 ONO on 2L >> no sig desatn  CT chest 10/2014 neg PE    02/10/2015  Chief Complaint  Patient presents with  . Follow-up    breathing doing well.  no concerns.   12/06/2014 >>2.5 L at rest , 3L on exertion - ONO ok in the past  He complains of morning headaches Breathing is otherwise stable   Review of Systems neg for any significant sore throat, dysphagia, itching, sneezing, nasal congestion or excess/ purulent secretions, fever, chills, sweats, unintended wt loss, pleuritic or exertional cp, hempoptysis, orthopnea pnd or change in chronic leg swelling. Also denies presyncope, palpitations, heartburn, abdominal pain, nausea, vomiting, diarrhea or change in bowel or urinary habits, dysuria,hematuria, rash, arthralgias, visual complaints, headache, numbness weakness or ataxia.     Objective:   Physical Exam  Gen. Pleasant, well-nourished, in no distress ENT - no lesions, no post nasal drip Neck: No JVD, no thyromegaly, no carotid bruits Lungs: no use of accessory muscles, no dullness to percussion, decreased without rales or rhonchi  Cardiovascular: Rhythm regular, heart sounds  normal, no murmurs or gallops, no peripheral edema Musculoskeletal: No deformities, no cyanosis or clubbing        Assessment & Plan:

## 2015-02-11 ENCOUNTER — Other Ambulatory Visit: Payer: Self-pay | Admitting: Family Medicine

## 2015-02-11 LAB — CLOSTRIDIUM DIFFICILE BY PCR: CDIFFPCR: DETECTED — AB

## 2015-02-11 MED ORDER — VANCOMYCIN HCL 125 MG PO CAPS: 125.0000 mg | ORAL_CAPSULE | Freq: Four times a day (QID) | ORAL | Status: DC

## 2015-02-11 NOTE — Telephone Encounter (Signed)
Vancomycin sent in

## 2015-02-11 NOTE — Assessment & Plan Note (Signed)
She will try to decrease oxygen to 2 L during sleep-nocturnal oximetry on this setting has been okay in the past. A Foley this will decrease her morning headaches

## 2015-02-11 NOTE — Assessment & Plan Note (Signed)
We'll try to taper off steroids on next visit if she remains stable without flares

## 2015-02-14 LAB — STOOL CULTURE

## 2015-02-17 DIAGNOSIS — D649 Anemia, unspecified: Secondary | ICD-10-CM | POA: Diagnosis not present

## 2015-02-17 DIAGNOSIS — J449 Chronic obstructive pulmonary disease, unspecified: Secondary | ICD-10-CM | POA: Diagnosis not present

## 2015-02-17 DIAGNOSIS — J9621 Acute and chronic respiratory failure with hypoxia: Secondary | ICD-10-CM | POA: Diagnosis not present

## 2015-03-29 ENCOUNTER — Encounter: Payer: Self-pay | Admitting: Family Medicine

## 2015-03-29 ENCOUNTER — Other Ambulatory Visit: Payer: Self-pay | Admitting: Family Medicine

## 2015-03-29 DIAGNOSIS — K219 Gastro-esophageal reflux disease without esophagitis: Secondary | ICD-10-CM

## 2015-03-29 MED ORDER — OMEPRAZOLE-SODIUM BICARBONATE 40-1100 MG PO CAPS: 1.0000 | ORAL_CAPSULE | Freq: Two times a day (BID) | ORAL | Status: DC

## 2015-05-10 DIAGNOSIS — R0602 Shortness of breath: Secondary | ICD-10-CM | POA: Diagnosis not present

## 2015-05-12 ENCOUNTER — Encounter: Payer: Self-pay | Admitting: Adult Health

## 2015-05-12 ENCOUNTER — Ambulatory Visit (INDEPENDENT_AMBULATORY_CARE_PROVIDER_SITE_OTHER): Payer: Medicare PPO | Admitting: Adult Health

## 2015-05-12 VITALS — BP 100/63 | HR 84 | Temp 97.6°F | Ht 64.0 in | Wt 98.0 lb

## 2015-05-12 DIAGNOSIS — J449 Chronic obstructive pulmonary disease, unspecified: Secondary | ICD-10-CM

## 2015-05-12 DIAGNOSIS — J9611 Chronic respiratory failure with hypoxia: Secondary | ICD-10-CM

## 2015-05-12 MED ORDER — PREDNISONE 10 MG PO TABS: 5.0000 mg | ORAL_TABLET | Freq: Every day | ORAL | Status: DC

## 2015-05-12 NOTE — Assessment & Plan Note (Signed)
Compensated on present regimen  No changes  

## 2015-05-12 NOTE — Progress Notes (Signed)
   Subjective:    Patient ID: [name], female    DOB: [birth year], 74 y.o.   MRN: [id number]  HPI 63 yowf , heavy ex- smoker, quit 2002 with GOLD D COPD FEV1 26% 6/10 on 24 h O2 since 2009.  -completed pulm rehab oct '11  Recurrent flares 2-3/yr requiring steroid bursts  She uses 3 liters 24/7. On a regimen of budesonide/brovana & alb/atrovent nebs   Significant tests/ events  Admitted 12/2012 for COPD exacerbation .ABG 7.39/55/75 on 3L [place]  She required low dose prednisone since her admit in 02/2013   Had colonoscopy with polyp removal 06/2013  08/2013 BL pneumonia  03/2014 ONO on 2L >> no sig desatn  CT chest 10/2014 neg PE  12/06/2014 >>2.5 L at rest , 3L on exertion - ONO ok in the past  He complains of morning headaches Breathing is otherwise stable  05/12/2015 Follow up : COPD and Chronic Resp Failure on O2  Pt returns for 3 month follow up  Says overall she is doing ok.  Breathing not as good in hot weather.  Stays in doors mostly.  Gets winded eaisily.  Not as much energy .  Remains on brovana/budesonide neb Twice daily   Prednisone 5mg  daily .  Prevnar/PVX utd.  Denies chest pain, orthopnea, edema or fever.   Had CDiff , improved after vancomycin. BM have returned to normal .    Review of Systems neg for any significant sore throat, dysphagia, itching, sneezing, nasal congestion or excess/ purulent secretions, fever, chills, sweats, unintended wt loss, pleuritic or exertional cp, hempoptysis, orthopnea pnd or change in chronic leg swelling. Also denies presyncope, palpitations, heartburn, abdominal pain, nausea, vomiting, diarrhea or change in bowel or urinary habits, dysuria,hematuria, rash, arthralgias, visual complaints, headache, numbness weakness or ataxia.     Objective:   Physical Exam  Gen. Pleasant, frail /elderly  in no distress ENT - no lesions, no post nasal drip Neck: No JVD, no thyromegaly, no carotid bruits Lungs: no use of accessory  muscles, no dullness to percussion, decreased without rales or rhonchi  Cardiovascular: Rhythm regular, heart sounds  normal, no murmurs or gallops, no peripheral edema Musculoskeletal: No deformities, no cyanosis or clubbing        Assessment & Plan:

## 2015-05-12 NOTE — Patient Instructions (Addendum)
Continue on current regimen  Follow up Dr. Alva  In 3 months and As needed   

## 2015-05-12 NOTE — Assessment & Plan Note (Signed)
Cont on O2 .  

## 2015-05-16 NOTE — Progress Notes (Signed)
Reviewed & agree with plan  

## 2015-05-18 ENCOUNTER — Encounter: Payer: Self-pay | Admitting: Medical

## 2015-05-18 ENCOUNTER — Ambulatory Visit (INDEPENDENT_AMBULATORY_CARE_PROVIDER_SITE_OTHER): Payer: Medicare PPO | Admitting: Medical

## 2015-05-18 VITALS — BP 132/81 | HR 97 | Temp 97.2°F | Ht 64.0 in | Wt 97.2 lb

## 2015-05-18 DIAGNOSIS — R110 Nausea: Secondary | ICD-10-CM

## 2015-05-18 DIAGNOSIS — R350 Frequency of micturition: Secondary | ICD-10-CM | POA: Diagnosis not present

## 2015-05-18 DIAGNOSIS — M791 Myalgia: Secondary | ICD-10-CM

## 2015-05-18 DIAGNOSIS — R5383 Other fatigue: Secondary | ICD-10-CM | POA: Diagnosis not present

## 2015-05-18 DIAGNOSIS — IMO0001 Reserved for inherently not codable concepts without codable children: Secondary | ICD-10-CM

## 2015-05-18 DIAGNOSIS — E559 Vitamin D deficiency, unspecified: Secondary | ICD-10-CM | POA: Diagnosis not present

## 2015-05-18 DIAGNOSIS — M609 Myositis, unspecified: Secondary | ICD-10-CM | POA: Diagnosis not present

## 2015-05-18 LAB — POCT URINALYSIS DIPSTICK
Bilirubin, UA: NEGATIVE
Blood, UA: 50
GLUCOSE UA: NEGATIVE
KETONES UA: 80
NITRITE UA: NEGATIVE
PH UA: 7.5
PROTEIN UA: 30
Spec Grav, UA: 1.015
UROBILINOGEN UA: 0.2

## 2015-05-18 LAB — CBC WITH DIFFERENTIAL/PLATELET
Basophils Absolute: 0 10*3/uL (ref 0.0–0.1)
Basophils Relative: 0.4 % (ref 0.0–3.0)
EOS PCT: 0.2 % (ref 0.0–5.0)
Eosinophils Absolute: 0 10*3/uL (ref 0.0–0.7)
HCT: 38.9 % (ref 36.0–46.0)
Hemoglobin: 12.4 g/dL (ref 12.0–15.0)
LYMPHS ABS: 1.2 10*3/uL (ref 0.7–4.0)
Lymphocytes Relative: 10 % — ABNORMAL LOW (ref 12.0–46.0)
MCHC: 32 g/dL (ref 30.0–36.0)
MCV: 92 fl (ref 78.0–100.0)
MONOS PCT: 4.4 % (ref 3.0–12.0)
Monocytes Absolute: 0.5 10*3/uL (ref 0.1–1.0)
NEUTROS ABS: 9.8 10*3/uL — AB (ref 1.4–7.7)
NEUTROS PCT: 85 % — AB (ref 43.0–77.0)
Platelets: 277 10*3/uL (ref 150.0–400.0)
RBC: 4.23 Mil/uL (ref 3.87–5.11)
RDW: 13.8 % (ref 11.5–15.5)
WBC: 11.5 10*3/uL — ABNORMAL HIGH (ref 4.0–10.5)

## 2015-05-18 LAB — COMPREHENSIVE METABOLIC PANEL
ALK PHOS: 79 U/L (ref 39–117)
ALT: 18 U/L (ref 0–35)
AST: 25 U/L (ref 0–37)
Albumin: 4.2 g/dL (ref 3.5–5.2)
BUN: 19 mg/dL (ref 6–23)
CO2: 41 mEq/L — ABNORMAL HIGH (ref 19–32)
Calcium: 9.5 mg/dL (ref 8.4–10.5)
Chloride: 97 mEq/L (ref 96–112)
Creatinine, Ser: 0.37 mg/dL — ABNORMAL LOW (ref 0.40–1.20)
GFR: 182.03 mL/min (ref >60.00)
Glucose, Bld: 113 mg/dL — ABNORMAL HIGH (ref 70–99)
POTASSIUM: 4.6 meq/L (ref 3.5–5.1)
Sodium: 144 mEq/L (ref 135–145)
TOTAL PROTEIN: 6.9 g/dL (ref 6.0–8.3)
Total Bilirubin: 0.4 mg/dL (ref 0.2–1.2)

## 2015-05-18 LAB — POCT INFLUENZA A/B
Influenza A, POC: NEGATIVE
Influenza B, POC: NEGATIVE

## 2015-05-18 LAB — LIPASE: LIPASE: 22 U/L (ref 11.0–59.0)

## 2015-05-18 LAB — AMYLASE: Amylase: 42 U/L (ref 27–131)

## 2015-05-18 MED ORDER — ONDANSETRON HCL 4 MG PO TABS: 4.0000 mg | ORAL_TABLET | Freq: Three times a day (TID) | ORAL | Status: DC | PRN

## 2015-05-18 NOTE — Patient Instructions (Signed)
Since diffuse onset acute myalgia and your concern for flu did get flu test. This was unlikely but possible flu test was negative.   For your nausea and fatigue will get cmp, cbc, amylase lipase and UA.  rx zofran. Please make sure eating bland foods and hydrating(coud use [employer] water). Need to avoid dehydration.  Overall symptoms improved since yesterday.  You have regular follow up for chronic conditions with Dr. Charlett Blake tomorrow. Would encourage/ recommend you keep that appointment.

## 2015-05-18 NOTE — Addendum Note (Signed)
Addended by: Bunnie Domino on: 05/18/2015 02:03 PM   Modules accepted: Orders

## 2015-05-18 NOTE — Progress Notes (Signed)
Subjective:    Patient ID: [name], female    DOB: [birth year], 74 y.o.   MRN: [id number]  HPI  Pt in stating got sick last night. She just had onset of achiness last night. Then this am she got nausea and got dry heaving. She made 10 trips to bathroom to try to vomit but could not. Pt still has some achiness. Pt taking alleve for achiness today. Pt had not had anything to eat or drink today. Pt only had tomato sandwich last night at 7 pm. Pt stomach did not hurt.Denies any urinary symptoms. Pt states her ha yesterday is very faint.   Pt during exam speculated could this be flu?  Both nausea and myalgia have improved. Regarding nausea states has not eaten or drank anything today.   Review of Systems  Constitutional: Positive for fatigue. Negative for fever and chills.  HENT: Negative for congestion, ear pain, postnasal drip, sinus pressure and sore throat.   Respiratory: Negative for cough, chest tightness and wheezing.   Cardiovascular: Negative for chest pain and palpitations.  Gastrointestinal: Positive for nausea and vomiting. Negative for abdominal pain, constipation, blood in stool, abdominal distention and anal bleeding.  Genitourinary: Negative for dysuria, frequency, difficulty urinating, genital sores, vaginal pain and pelvic pain.  Musculoskeletal: Positive for myalgias. Negative for back pain.  Neurological: Positive for headaches. Negative for dizziness, syncope, speech difficulty, light-headedness and numbness.  Hematological: Negative for adenopathy. Does not bruise/bleed easily.  Psychiatric/Behavioral: Negative for behavioral problems and confusion.    Past Medical History  Diagnosis Date  . COPD (chronic obstructive pulmonary disease)   . Thyroid disease   . Glucose intolerance (impaired glucose tolerance)   . History of small bowel obstruction   . Arthralgia   . Dyspnea   . Pallor   . Chest pain   . Osteoporosis   . Dysuria   . Weight loss   .  Edema   . Weakness   . Anxiety   . Depression   . Migraine   . Hypertension   . Leukocytosis   . GERD (gastroesophageal reflux disease)   . Atrophic vaginitis 12/07/2012  . Hx of adenomatous colonic polyps   . Ischemic colitis   . Nocturia 09/02/2013  . Diarrhea 09/02/2013  . Hyperglycemia 12/01/2013  . Hypokalemia 11/28/2014  . Clostridium difficile diarrhea 12/27/2014    History   Social History  . Marital Status: Married    Spouse Name: N/A  . Number of Children: 1  . Years of Education: N/A   Occupational History  . Retired Hydrologist    Social History Main Topics  . Smoking status: Former Smoker -- 2.00 packs/day for 40 years    Types: Cigarettes    Quit date: 10/29/2001  . Smokeless tobacco: Never Used  . Alcohol Use: No  . Drug Use: No  . Sexual Activity: Not on file   Other Topics Concern  . Not on file   Social History Narrative    Past Surgical History  Procedure Laterality Date  . Cholecystectomy    . Abdominal hysterectomy  1993  . Tubal ligation    . Bladder repair    . Dilation and curettage of uterus      40 yrs ago  . Eye surgery  04/2011    bil. eyes  . Colonoscopy with propofol N/A 06/08/2013    Procedure: COLONOSCOPY WITH PROPOFOL;  Surgeon: Lafayette Dragon, MD;  Location: [facility];  Service: Endoscopy;  Laterality: N/A;    Family History  Problem Relation Age of Onset  . Alcohol abuse Mother   . Heart disease Mother   . Stroke Mother   . Hypertension Mother   . Kidney disease Mother   . COPD Mother   . Osteoporosis Mother     hip fracture  . COPD Brother   . Alcohol abuse Brother     drug abuse  . Hearing loss Brother     mvp  . Colon cancer Paternal Aunt   . Prostate cancer    . Alcohol abuse Father     MVA  . Thyroid disease Daughter   . COPD Paternal Grandfather   . COPD Sister   . Hypertension Sister   . Anxiety disorder Sister   . COPD Sister   . COPD Brother     current smoker  . Hearing loss  Brother     cad  . COPD Brother     Allergies  Allergen Reactions  . Morphine Other (See Comments)    REACTION: nightmares  . Ambien [Zolpidem] Anxiety  . Cymbalta [Duloxetine Hcl] Anxiety  . Nitrofurantoin Rash    Current Outpatient Prescriptions on File Prior to Visit  Medication Sig Dispense Refill  . albuterol (PROVENTIL HFA;VENTOLIN HFA) 108 (90 BASE) MCG/ACT inhaler Inhale 2 puffs into the lungs every 6 (six) hours as needed for wheezing or shortness of breath. 3 Inhaler 3  . albuterol (PROVENTIL) (2.5 MG/3ML) 0.083% nebulizer solution Take 3 mLs (2.5 mg total) by nebulization every 4 (four) hours as needed for wheezing or shortness of breath. Dx 496 300 mL 0  . ALPRAZolam (XANAX) 0.5 MG tablet 1 tab po bid and 2 tabs po qhs prn anxiety, insomnia 360 tablet 1  . arformoterol (BROVANA) 15 MCG/2ML NEBU Take 2 mLs (15 mcg total) by nebulization 2 (two) times daily. Dx 496 360 mL 3  . budesonide (PULMICORT) 0.25 MG/2ML nebulizer solution Take 2 mLs (0.25 mg total) by nebulization 2 (two) times daily. Dx: 496 360 mL 3  . Calcium Carbonate-Vitamin D 600-400 MG-UNIT per tablet Take 1 tablet by mouth 2 (two) times daily.      . citalopram (CELEXA) 20 MG tablet Take 0.5 tablets (10 mg total) by mouth daily. 90 tablet 1  . Ferrous Fumarate-Folic Acid (HEMOCYTE-F) 324-1 MG TABS Take 1 tablet by mouth daily. 30 each 5  . fluticasone (FLONASE) 50 MCG/ACT nasal spray Place 2 sprays into both nostrils 2 (two) times daily.    . Glucosamine-Chondroitin (GLUCOSAMINE CHONDR COMPLEX PO) Take 1 tablet by mouth 2 (two) times daily.     . mirtazapine (REMERON) 15 MG tablet Take 1 tablet (15 mg total) by mouth at bedtime. 90 tablet 1  . omeprazole-sodium bicarbonate (ZEGERID) 40-1100 MG per capsule Take 1 capsule by mouth 2 (two) times daily. 180 capsule 1  . predniSONE (DELTASONE) 10 MG tablet Take 0.5 tablets (5 mg total) by mouth daily. 90 tablet 0  . vancomycin (VANCOCIN) 125 MG capsule TK ONE C PO  QID FOR 21 DAYS  0  . ipratropium-albuterol (DUONEB) 0.5-2.5 (3) MG/3ML SOLN Take 3 mLs by nebulization every 6 (six) hours as needed. 360 mL 0  . metoprolol succinate (TOPROL-XL) 25 MG 24 hr tablet Take 1 tablet (25 mg total) by mouth daily. 30 tablet 2   No current facility-administered medications on file prior to visit.    BP 132/81 mmHg  Pulse 97  Temp(Src) 97.2 F (36.2 C) (Oral)  Ht 5'  4" (1.626 m)  Wt 97 lb 3.2 oz (44.09 kg)  BMI 16.68 kg/m2  SpO2 98%       Objective:   Physical Exam   General Appearance- Not in acute distress.  HEENT Eyes- Scleraeral/Conjuntiva-bilat- Not Yellow. Mouth & Throat- Normal.  Chest and Lung Exam Auscultation: Breath sounds:-even unlabored but shallow respirations Adventitious sounds:- No Adventitious sounds.  Cardiovascular Auscultation:Rythm - Regular. Heart Sounds -Normal heart sounds.  Abdomen Inspection:-Inspection Normal.  Palpation/Perucssion: Palpation and Percussion of the abdomen reveal- Non Tender except faint luq tenderness, No Rebound tenderness, No rigidity(Guarding) and No Palpable abdominal masses.  Liver:-Normal.  Spleen:- Normal.   Back- no cva tenderness.  Skin- moist. No tenting.   Neurologic Cranial Nerve exam:- CN III-XII intact(No nystagmus), symmetric smile. tStrength:- 5/5 equal and symmetric strength both upper and lower extremities.       Assessment & Plan:  Since diffuse onset acute myalgia and your concern for flu did get flu test. This was unlikely but possible flu test was negative.   For your nausea and fatigue will get cmp, cbc, amylase lipase and UA.  rx zofran. Please make sure eating bland foods and hydrating(coud use [employer] water). Need to avoid dehydration.  Overall symptoms improved since yesterday.  You have regular follow up for chronic conditions with Dr. Charlett Blake tomorrow. Would encourage/ recommend you keep that appointment.

## 2015-05-18 NOTE — Progress Notes (Signed)
Pre visit review using our clinic review tool, if applicable. No additional management support is needed unless otherwise documented below in the visit note. 

## 2015-05-19 ENCOUNTER — Encounter: Payer: Self-pay | Admitting: Internal Medicine

## 2015-05-19 ENCOUNTER — Ambulatory Visit (INDEPENDENT_AMBULATORY_CARE_PROVIDER_SITE_OTHER): Payer: Medicare PPO | Admitting: Family Medicine

## 2015-05-19 ENCOUNTER — Encounter: Payer: Self-pay | Admitting: Family Medicine

## 2015-05-19 VITALS — BP 110/70 | HR 90 | Temp 97.5°F | Resp 18 | Ht 64.0 in | Wt 95.8 lb

## 2015-05-19 DIAGNOSIS — J9611 Chronic respiratory failure with hypoxia: Secondary | ICD-10-CM | POA: Diagnosis not present

## 2015-05-19 DIAGNOSIS — G4700 Insomnia, unspecified: Secondary | ICD-10-CM

## 2015-05-19 DIAGNOSIS — I10 Essential (primary) hypertension: Secondary | ICD-10-CM

## 2015-05-19 DIAGNOSIS — D72829 Elevated white blood cell count, unspecified: Secondary | ICD-10-CM

## 2015-05-19 DIAGNOSIS — K219 Gastro-esophageal reflux disease without esophagitis: Secondary | ICD-10-CM

## 2015-05-19 LAB — URINE CULTURE: Colony Count: 10000

## 2015-05-19 LAB — CBC
HEMATOCRIT: 37 % (ref 36.0–46.0)
Hemoglobin: 12 g/dL (ref 12.0–15.0)
MCHC: 32.5 g/dL (ref 30.0–36.0)
MCV: 91 fl (ref 78.0–100.0)
PLATELETS: 297 10*3/uL (ref 150.0–400.0)
RBC: 4.07 Mil/uL (ref 3.87–5.11)
RDW: 13.5 % (ref 11.5–15.5)
WBC: 10.1 10*3/uL (ref 4.0–10.5)

## 2015-05-19 MED ORDER — CITALOPRAM HYDROBROMIDE 10 MG PO TABS: 10.0000 mg | ORAL_TABLET | Freq: Every day | ORAL | Status: DC

## 2015-05-19 NOTE — Patient Instructions (Signed)
Food Poisoning °Food poisoning is an illness caused by something you ate or drank. There are over 250 known causes of food poisoning. However, many other causes are unknown. You can be treated even if the exact cause of your food poisoning is not known. In most cases, food poisoning is mild and lasts 1 to 2 days. However, some cases can be serious, especially for people with low immune systems, the elderly, children and infants, and pregnant women. °CAUSES  °Poor personal hygiene, improper cleaning of storage and preparation areas, and unclean utensils can cause infection or tainting (contamination) of foods. The causes of food poisoning are numerous. Infectious agents, such as viruses, bacteria, or parasites, can cause harm by infecting the intestine and disrupting the absorption of nutrients and water. This can cause diarrhea and lead to dehydration. Viruses are responsible for most of the food poisonings in which an agent is found. Parasites are less likely to cause food poisoning. Toxic agents, such as poisonous mushrooms, marine algae, and pesticides can also cause food poisoning. °· Viral causes of food poisoning include: °¨ Norovirus. °¨ Rotavirus. °¨ Hepatitis A. °· Bacterial causes of food poisoning include: °¨ Salmonellae. °¨ Campylobacter. °¨ Bacillus cereus. °¨ Escherichia coli (E. coli). °¨ Shigella. °¨ Listeria monocytogenes. °¨ Clostridium botulinum (botulism). °¨ Vibrio cholerae. °· Parasites that can cause food poisoning include: °¨ Giardia. °¨ Cryptosporidium. °¨ Toxoplasma. °SYMPTOMS °Symptoms may appear several hours or longer after consuming the contaminated food or drink. Symptoms may include: °· Nausea. °· Vomiting. °· Cramping. °· Diarrhea. °· Fever and chills. °· Muscle aches. °DIAGNOSIS °Your health care provider may be able to diagnose food poisoning from a list of what you have recently eaten and results from lab tests. Diagnostic tests may include an exam of the feces. °TREATMENT °In  most cases, treatment focuses on helping to relieve your symptoms and staying well hydrated. Antibiotic medicines are rarely needed. In severe cases, hospitalization may be required. °HOME CARE INSTRUCTIONS  °· Drink enough water and fluids to keep your urine clear or pale yellow. Drink small amounts of fluids frequently and increase as tolerated. °· Ask your health care provider for specific rehydration instructions. °· Avoid: °¨ Foods high in sugar. °¨ Alcohol. °¨ Carbonated drinks. °¨ Tobacco. °¨ Juice. °¨ Caffeine drinks. °¨ Extremely hot or cold fluids. °¨ Fatty, greasy foods. °¨ Too much intake of anything at one time. °¨ Dairy products until 24 to 48 hours after diarrhea stops. °· You may consume probiotics. Probiotics are active cultures of beneficial bacteria. They may lessen the amount and number of diarrheal stools in adults. Probiotics can be found in yogurt with active cultures and in supplements. °· Wash your hands well to avoid spreading the bacteria. °· Take medicines only as directed by your health care provider. Do not give your child aspirin because of the association with Reye's syndrome. °· Ask your health care provider if you should continue to take your regular prescribed and over-the-counter medicines. °PREVENTION  °· Wash your hands, food preparation surfaces, and utensils thoroughly before and after handling raw foods. °· Keep refrigerated foods below 40°F (5°C). °· Serve hot foods immediately or keep them heated above 140°F (60°C). °· Divide large volumes of food into small portions for rapid cooling in the refrigerator. Hot, bulky foods in the refrigerator can raise the temperature of other foods that have already cooled. °· Follow approved canning procedures. °· Heat canned foods thoroughly before tasting. °· When in doubt, throw it out. °· Infants, the elderly, women   who are pregnant, and people with compromised immune systems are especially susceptible to food poisoning. These people  should never consume unpasteurized cheese, unpasteurized cider, raw fish, raw seafood, or raw meat-type products. °SEEK IMMEDIATE MEDICAL CARE IF:  °· You have difficulty breathing, swallowing, talking, or moving. °· You develop blurred vision. °· You are unable to keep fluids down. °· You faint or nearly faint. °· Your eyes turn yellow. °· Vomiting or diarrhea develops or becomes persistent. °· Abdominal pain develops, increases, or localizes in one small area. °· You have a fever. °· The diarrhea becomes excessive or contains blood or mucus. °· You develop excessive weakness, dizziness, or extreme thirst. °· You have no urine for 8 hours. °MAKE SURE YOU:  °· Understand these instructions. °· Will watch your condition. °· Will get help right away if you are not doing well or get worse. °Document Released: 07/13/2004 Document Revised: 03/01/2014 Document Reviewed: 03/01/2011 °ExitCare® Patient Information ©2015 [employer]. This information is not intended to replace advice given to you by your health care provider. Make sure you discuss any questions you have with your health care provider. ° °

## 2015-05-19 NOTE — Progress Notes (Signed)
Pre visit review using our clinic review tool, if applicable. No additional management support is needed unless otherwise documented below in the visit note. 

## 2015-06-04 ENCOUNTER — Encounter: Payer: Self-pay | Admitting: Family Medicine

## 2015-06-04 DIAGNOSIS — D72829 Elevated white blood cell count, unspecified: Secondary | ICD-10-CM | POA: Insufficient documentation

## 2015-06-04 NOTE — Progress Notes (Signed)
[name]  [id number] Feb 11, 1941 06/04/2015      Progress Note-Follow Up  Subjective  Chief Complaint  Chief Complaint  Patient presents with  . Follow-up    Pt here for folow up on nausea.     HPI  Patient is a 74 y.o. female in today for routine medical care.  Patient is in today for Was just seen earlier this week with nausea and vomiting. But reports that is improving. Does still have a mild headache but it is also improving. No fevers, chills, does note some malaise and myalgias. Denies CP/palp/congestion/fevers or GU c/o. Taking meds as prescribed  Past Medical History  Diagnosis Date  . COPD (chronic obstructive pulmonary disease)   . Thyroid disease   . Glucose intolerance (impaired glucose tolerance)   . History of small bowel obstruction   . Arthralgia   . Dyspnea   . Pallor   . Chest pain   . Osteoporosis   . Dysuria   . Weight loss   . Edema   . Weakness   . Anxiety   . Depression   . Migraine   . Hypertension   . Leukocytosis   . GERD (gastroesophageal reflux disease)   . Atrophic vaginitis 12/07/2012  . Hx of adenomatous colonic polyps   . Ischemic colitis   . Nocturia 09/02/2013  . Diarrhea 09/02/2013  . Hyperglycemia 12/01/2013  . Hypokalemia 11/28/2014  . Clostridium difficile diarrhea 12/27/2014    Past Surgical History  Procedure Laterality Date  . Cholecystectomy    . Abdominal hysterectomy  1993  . Tubal ligation    . Bladder repair    . Dilation and curettage of uterus      40 yrs ago  . Eye surgery  04/2011    bil. eyes  . Colonoscopy with propofol N/A 06/08/2013    Procedure: COLONOSCOPY WITH PROPOFOL;  Surgeon: Lafayette Dragon, MD;  Location: [facility];  Service: Endoscopy;  Laterality: N/A;    Family History  Problem Relation Age of Onset  . Alcohol abuse Mother   . Heart disease Mother   . Stroke Mother   . Hypertension Mother   . Kidney disease Mother   . COPD Mother   . Osteoporosis Mother     hip fracture  . COPD  Brother   . Alcohol abuse Brother     drug abuse  . Hearing loss Brother     mvp  . Colon cancer Paternal Aunt   . Prostate cancer    . Alcohol abuse Father     MVA  . Thyroid disease Daughter   . COPD Paternal Grandfather   . COPD Sister   . Hypertension Sister   . Anxiety disorder Sister   . COPD Sister   . COPD Brother     current smoker  . Hearing loss Brother     cad  . COPD Brother     History   Social History  . Marital Status: Married    Spouse Name: N/A  . Number of Children: 1  . Years of Education: N/A   Occupational History  . Retired Hydrologist    Social History Main Topics  . Smoking status: Former Smoker -- 2.00 packs/day for 40 years    Types: Cigarettes    Quit date: 10/29/2001  . Smokeless tobacco: Never Used  . Alcohol Use: No  . Drug Use: No  . Sexual Activity: Not on file   Other Topics Concern  .  Not on file   Social History Narrative    Current Outpatient Prescriptions on File Prior to Visit  Medication Sig Dispense Refill  . albuterol (PROVENTIL HFA;VENTOLIN HFA) 108 (90 BASE) MCG/ACT inhaler Inhale 2 puffs into the lungs every 6 (six) hours as needed for wheezing or shortness of breath. 3 Inhaler 3  . albuterol (PROVENTIL) (2.5 MG/3ML) 0.083% nebulizer solution Take 3 mLs (2.5 mg total) by nebulization every 4 (four) hours as needed for wheezing or shortness of breath. Dx 496 300 mL 0  . ALPRAZolam (XANAX) 0.5 MG tablet 1 tab po bid and 2 tabs po qhs prn anxiety, insomnia 360 tablet 1  . arformoterol (BROVANA) 15 MCG/2ML NEBU Take 2 mLs (15 mcg total) by nebulization 2 (two) times daily. Dx 496 360 mL 3  . budesonide (PULMICORT) 0.25 MG/2ML nebulizer solution Take 2 mLs (0.25 mg total) by nebulization 2 (two) times daily. Dx: 496 360 mL 3  . Calcium Carbonate-Vitamin D 600-400 MG-UNIT per tablet Take 1 tablet by mouth 2 (two) times daily.      . Ferrous Fumarate-Folic Acid (HEMOCYTE-F) 324-1 MG TABS Take 1 tablet by  mouth daily. 30 each 5  . fluticasone (FLONASE) 50 MCG/ACT nasal spray Place 2 sprays into both nostrils 2 (two) times daily.    . Glucosamine-Chondroitin (GLUCOSAMINE CHONDR COMPLEX PO) Take 1 tablet by mouth 2 (two) times daily.     Marland Kitchen ipratropium-albuterol (DUONEB) 0.5-2.5 (3) MG/3ML SOLN Take 3 mLs by nebulization every 6 (six) hours as needed. 360 mL 0  . metoprolol succinate (TOPROL-XL) 25 MG 24 hr tablet Take 1 tablet (25 mg total) by mouth daily. 30 tablet 2  . mirtazapine (REMERON) 15 MG tablet Take 1 tablet (15 mg total) by mouth at bedtime. 90 tablet 1  . omeprazole-sodium bicarbonate (ZEGERID) 40-1100 MG per capsule Take 1 capsule by mouth 2 (two) times daily. 180 capsule 1  . ondansetron (ZOFRAN) 4 MG tablet Take 1 tablet (4 mg total) by mouth every 8 (eight) hours as needed for nausea or vomiting. 9 tablet 0  . predniSONE (DELTASONE) 10 MG tablet Take 0.5 tablets (5 mg total) by mouth daily. 90 tablet 0   No current facility-administered medications on file prior to visit.    Allergies  Allergen Reactions  . Morphine Other (See Comments)    REACTION: nightmares  . Ambien [Zolpidem] Anxiety  . Cymbalta [Duloxetine Hcl] Anxiety  . Nitrofurantoin Rash    Review of Systems  Review of Systems  Constitutional: Positive for malaise/fatigue. Negative for fever.  HENT: Negative for congestion.   Eyes: Negative for discharge.  Respiratory: Negative for shortness of breath.   Cardiovascular: Negative for chest pain, palpitations and leg swelling.  Gastrointestinal: Positive for nausea. Negative for heartburn, vomiting, abdominal pain and diarrhea.  Genitourinary: Negative for dysuria.  Musculoskeletal: Negative for falls.  Skin: Negative for rash.  Neurological: Positive for headaches. Negative for loss of consciousness.  Endo/Heme/Allergies: Negative for polydipsia.  Psychiatric/Behavioral: Negative for depression and suicidal ideas. The patient is not nervous/anxious and does  not have insomnia.     Objective  BP 110/70 mmHg  Pulse 90  Temp(Src) 97.5 F (36.4 C) (Oral)  Resp 18  Ht 5\' 4"  (1.626 m)  Wt 95 lb 12.8 oz (43.455 kg)  BMI 16.44 kg/m2  SpO2 98%  Physical Exam  Physical Exam  Constitutional: She is oriented to person, place, and time and well-developed, well-nourished, and in no distress. No distress.  frail  HENT:  Head:  Normocephalic and atraumatic.  Eyes: Conjunctivae are normal.  Neck: Neck supple. No thyromegaly present.  Cardiovascular: Normal rate, regular rhythm and normal heart sounds.   Pulmonary/Chest: Effort normal and breath sounds normal. She has no wheezes.  [place] for O2  Abdominal: She exhibits no distension and no mass.  Musculoskeletal: She exhibits no edema.  Lymphadenopathy:    She has no cervical adenopathy.  Neurological: She is alert and oriented to person, place, and time.  Skin: Skin is warm and dry. No rash noted. She is not diaphoretic.  Psychiatric: Memory, affect and judgment normal.    Lab Results  Component Value Date   TSH 1.21 02/03/2015   Lab Results  Component Value Date   WBC 10.1 05/19/2015   HGB 12.0 05/19/2015   HCT 37.0 05/19/2015   MCV 91.0 05/19/2015   PLT 297.0 05/19/2015   Lab Results  Component Value Date   CREATININE 0.37* 05/18/2015   BUN 19 05/18/2015   NA 144 05/18/2015   K 4.6 05/18/2015   CL 97 05/18/2015   CO2 41* 05/18/2015   Lab Results  Component Value Date   ALT 18 05/18/2015   AST 25 05/18/2015   ALKPHOS 79 05/18/2015   BILITOT 0.4 05/18/2015   Lab Results  Component Value Date   CHOL 171 11/12/2014   Lab Results  Component Value Date   HDL 65 11/12/2014   Lab Results  Component Value Date   LDLCALC 29 11/12/2014   Lab Results  Component Value Date   TRIG 384* 11/12/2014   Lab Results  Component Value Date   CHOLHDL 2.6 11/12/2014     Assessment & Plan  Insomnia Encouraged good sleep hygiene such as dark, quiet room. No blue/green glowing  lights such as computer screens in bedroom. No alcohol or stimulants in evening. Cut down on caffeine as able. Regular exercise is helpful but not just prior to bed time.   Leukocytosis Resolved on recheck and patient feeling somewhat better today.  HYPERTENSION, BENIGN ESSENTIAL Well controlled, no changes to meds. Encouraged heart healthy diet such as the DASH diet and exercise as tolerated.   Chronic respiratory failure Patient at baseline on chronic O2  GERD Avoid offending foods, start probiotics. Do not eat large meals in late evening and consider raising head of bed. Was struggling with N/V yesterday is improved today

## 2015-06-04 NOTE — Assessment & Plan Note (Signed)
Encouraged good sleep hygiene such as dark, quiet room. No blue/green glowing lights such as computer screens in bedroom. No alcohol or stimulants in evening. Cut down on caffeine as able. Regular exercise is helpful but not just prior to bed time.  

## 2015-06-04 NOTE — Assessment & Plan Note (Signed)
Patient at baseline on chronic O2

## 2015-06-04 NOTE — Assessment & Plan Note (Signed)
Well controlled, no changes to meds. Encouraged heart healthy diet such as the DASH diet and exercise as tolerated.  °

## 2015-06-04 NOTE — Assessment & Plan Note (Signed)
Avoid offending foods, start probiotics. Do not eat large meals in late evening and consider raising head of bed. Was struggling with N/V yesterday is improved today

## 2015-06-04 NOTE — Assessment & Plan Note (Signed)
Resolved on recheck and patient feeling somewhat better today.

## 2015-06-10 DIAGNOSIS — R0602 Shortness of breath: Secondary | ICD-10-CM | POA: Diagnosis not present

## 2015-06-21 ENCOUNTER — Encounter: Payer: Self-pay | Admitting: Pulmonary Disease

## 2015-06-21 DIAGNOSIS — J449 Chronic obstructive pulmonary disease, unspecified: Secondary | ICD-10-CM

## 2015-06-21 MED ORDER — ALBUTEROL SULFATE (2.5 MG/3ML) 0.083% IN NEBU: 2.5000 mg | INHALATION_SOLUTION | RESPIRATORY_TRACT | Status: DC | PRN

## 2015-06-21 MED ORDER — ARFORMOTEROL TARTRATE 15 MCG/2ML IN NEBU: 15.0000 ug | INHALATION_SOLUTION | Freq: Two times a day (BID) | RESPIRATORY_TRACT | Status: DC

## 2015-06-21 NOTE — Telephone Encounter (Signed)
Refill sent to pharmacy. Patient notified via mychart. Nothing further needed.

## 2015-06-30 ENCOUNTER — Telehealth: Payer: Self-pay | Admitting: Pulmonary Disease

## 2015-06-30 DIAGNOSIS — J449 Chronic obstructive pulmonary disease, unspecified: Secondary | ICD-10-CM

## 2015-06-30 MED ORDER — BUDESONIDE 0.25 MG/2ML IN SUSP: 0.2500 mg | Freq: Two times a day (BID) | RESPIRATORY_TRACT | Status: DC

## 2015-06-30 NOTE — Telephone Encounter (Signed)
Called pt and is aware pulmicort has been faxed in. Nothing further needed

## 2015-07-11 DIAGNOSIS — R0602 Shortness of breath: Secondary | ICD-10-CM | POA: Diagnosis not present

## 2015-07-14 ENCOUNTER — Ambulatory Visit

## 2015-07-14 DIAGNOSIS — Z23 Encounter for immunization: Secondary | ICD-10-CM

## 2015-07-14 MED ORDER — INFLUENZA VAC SPLIT QUAD 0.5 ML IM SUSY
0.5000 mL | PREFILLED_SYRINGE | Freq: Once | INTRAMUSCULAR | Status: AC
  Administered 2015-07-14: 0.5 mL via INTRAMUSCULAR

## 2015-07-14 MED ORDER — INFLUENZA VAC SPLIT HIGH-DOSE 0.5 ML IM SUSY: PREFILLED_SYRINGE | Freq: Once | INTRAMUSCULAR | Status: DC

## 2015-08-04 ENCOUNTER — Other Ambulatory Visit: Payer: Self-pay | Admitting: Family Medicine

## 2015-08-04 DIAGNOSIS — F32A Depression, unspecified: Secondary | ICD-10-CM

## 2015-08-04 DIAGNOSIS — F329 Major depressive disorder, single episode, unspecified: Secondary | ICD-10-CM

## 2015-08-04 DIAGNOSIS — F419 Anxiety disorder, unspecified: Principal | ICD-10-CM

## 2015-08-04 MED ORDER — ALPRAZOLAM 0.5 MG PO TABS: ORAL_TABLET | ORAL | Status: DC

## 2015-08-04 NOTE — Telephone Encounter (Signed)
Requesting:  Alprazolam Contract  08/12/14 UDS Low risk Last OV  05/19/15 Last Refill  01/02/15  #[id number] with 1 refills   Please Advise

## 2015-08-05 MED ORDER — ALPRAZOLAM 0.5 MG PO TABS: ORAL_TABLET | ORAL | Status: DC

## 2015-08-05 NOTE — Telephone Encounter (Signed)
Printed and on counter for signature. 

## 2015-08-05 NOTE — Telephone Encounter (Signed)
Faxed hardcopy for Alprazolam to [facility]

## 2015-08-05 NOTE — Addendum Note (Signed)
Addended by: Sharon Seller B on: 08/05/2015 07:12 AM   Modules accepted: Orders

## 2015-08-10 DIAGNOSIS — R0602 Shortness of breath: Secondary | ICD-10-CM | POA: Diagnosis not present

## 2015-08-18 ENCOUNTER — Ambulatory Visit (INDEPENDENT_AMBULATORY_CARE_PROVIDER_SITE_OTHER): Payer: Medicare PPO | Admitting: Adult Health

## 2015-08-18 ENCOUNTER — Encounter: Payer: Self-pay | Admitting: Adult Health

## 2015-08-18 VITALS — BP 136/68 | HR 78 | Temp 98.1°F | Ht 62.0 in | Wt 97.0 lb

## 2015-08-18 DIAGNOSIS — J9611 Chronic respiratory failure with hypoxia: Secondary | ICD-10-CM

## 2015-08-18 DIAGNOSIS — J441 Chronic obstructive pulmonary disease with (acute) exacerbation: Secondary | ICD-10-CM

## 2015-08-18 NOTE — Patient Instructions (Addendum)
Increase Prednisone 20mg  daily for 5 days , 10mg  daily for 5 days, then back to 5mg  daily  Continue on Brovana and Budesonide Neb Twice daily  .  Continue on 2 L rest and 3l/m activity .  Follow up Dr. Elsworth Soho  3 months and As needed   Please contact office for sooner follow up if symptoms do not improve or worsen or seek emergency care

## 2015-08-18 NOTE — Progress Notes (Signed)
   Subjective:    Patient ID: [name], female    DOB: October 03, 1941, 74 y.o.   MRN: [id number]  HPI 52 yowf , heavy ex- smoker, quit 2002 with GOLD D COPD FEV1 26% 6/10 on 24 h O2 since 2009.  -completed pulm rehab oct '11  Recurrent flares 2-3/yr requiring steroid bursts  She uses 3 liters 24/7. On a regimen of budesonide/brovana & alb/atrovent nebs   Significant tests/ events  Admitted 12/2012 for COPD exacerbation .ABG 7.39/55/75 on 3L [place]  She required low dose prednisone since her admit in 02/2013   Had colonoscopy with polyp removal 06/2013  08/2013 BL pneumonia  03/2014 ONO on 2L >> no sig desatn  CT chest 10/2014 neg PE  12/06/2014 >>2.5 L at rest , 3L on exertion - ONO ok in the past    08/18/2015 Follow up : COPD and Chronic Resp Failure on O2  Pt returns for 3 month follow up  Staying more short winded than usual for last couple of weeks . Has chest congestion w/ clear mucus . , wheezing.    Gets winded eaisily.   Remains on brovana/budesonide neb Twice daily   On Prednisone 5mg  daily .  Flu /Prevnar/PVX utd.  Denies chest pain, orthopnea, edema or fever or discolored mucus .    Had C Diff in summer 2016, resolved . We discussed cautious use of abx .   Review of Systems neg for any significant sore throat, dysphagia, itching, sneezing, nasal congestion or excess/ purulent secretions, fever, chills, sweats, unintended wt loss, pleuritic or exertional cp, hempoptysis, orthopnea pnd or change in chronic leg swelling. Also denies presyncope, palpitations, heartburn, abdominal pain, nausea, vomiting, diarrhea or change in bowel or urinary habits, dysuria,hematuria, rash, arthralgias, visual complaints, headache, numbness weakness or ataxia.     Objective:   Physical Exam  Gen. Pleasant, frail /elderly  in no distress ENT - no lesions, no post nasal drip Neck: No JVD, no thyromegaly, no carotid bruits Lungs: no use of accessory muscles, no dullness to  percussion, decreased BS bases , w/ faint exp wheeze on forced exp .  Cardiovascular: Rhythm regular, heart sounds  normal, no murmurs or gallops, no peripheral edema Musculoskeletal: No deformities, no cyanosis or clubbing        Assessment & Plan:

## 2015-08-18 NOTE — Assessment & Plan Note (Signed)
Exacerbation   Plan  Increase Prednisone 20mg  daily for 5 days , 10mg  daily for 5 days, then back to 5mg  daily  Continue on Brovana and Budesonide Neb Twice daily  .  Continue on 2 L rest and 3l/m activity .  Follow up Dr. Elsworth Soho  3 months and As needed   Please contact office for sooner follow up if symptoms do not improve or worsen or seek emergency care

## 2015-08-18 NOTE — Assessment & Plan Note (Signed)
Compensated on O2  

## 2015-08-18 NOTE — Progress Notes (Signed)
Reviewed & agree with plan  

## 2015-08-30 ENCOUNTER — Ambulatory Visit (INDEPENDENT_AMBULATORY_CARE_PROVIDER_SITE_OTHER): Payer: Medicare PPO | Admitting: Family Medicine

## 2015-08-30 ENCOUNTER — Encounter: Payer: Self-pay | Admitting: Family Medicine

## 2015-08-30 VITALS — BP 132/72 | HR 91 | Temp 98.2°F | Ht 62.0 in | Wt 98.4 lb

## 2015-08-30 DIAGNOSIS — K219 Gastro-esophageal reflux disease without esophagitis: Secondary | ICD-10-CM

## 2015-08-30 DIAGNOSIS — G4700 Insomnia, unspecified: Secondary | ICD-10-CM | POA: Diagnosis not present

## 2015-08-30 DIAGNOSIS — J441 Chronic obstructive pulmonary disease with (acute) exacerbation: Secondary | ICD-10-CM

## 2015-08-30 DIAGNOSIS — R197 Diarrhea, unspecified: Secondary | ICD-10-CM | POA: Diagnosis not present

## 2015-08-30 DIAGNOSIS — I10 Essential (primary) hypertension: Secondary | ICD-10-CM | POA: Diagnosis not present

## 2015-08-30 DIAGNOSIS — D649 Anemia, unspecified: Secondary | ICD-10-CM

## 2015-08-30 MED ORDER — MIRTAZAPINE 15 MG PO TABS: 15.0000 mg | ORAL_TABLET | Freq: Every day | ORAL | Status: DC

## 2015-08-30 MED ORDER — FERROUS FUMARATE-FOLIC ACID 324-1 MG PO TABS: 1.0000 | ORAL_TABLET | Freq: Every day | ORAL | Status: AC

## 2015-08-30 MED ORDER — OMEPRAZOLE-SODIUM BICARBONATE 40-1100 MG PO CAPS: 1.0000 | ORAL_CAPSULE | Freq: Two times a day (BID) | ORAL | Status: DC

## 2015-08-30 MED ORDER — METOPROLOL SUCCINATE ER 25 MG PO TB24: 25.0000 mg | ORAL_TABLET | Freq: Every day | ORAL | Status: DC

## 2015-08-30 MED ORDER — CITALOPRAM HYDROBROMIDE 10 MG PO TABS: 10.0000 mg | ORAL_TABLET | Freq: Every day | ORAL | Status: DC

## 2015-08-30 NOTE — Progress Notes (Signed)
Pre visit review using our clinic review tool, if applicable. No additional management support is needed unless otherwise documented below in the visit note. 

## 2015-08-30 NOTE — Patient Instructions (Signed)
Clostridium Difficile Infection Clostridium difficile (C. difficile or C. diff) is a bacterium normally found in the intestinal tract or colon. C. difficile infection causes diarrhea and sometimes a severe disease called pseudomembranous colitis (C. difficile colitis). C. difficile colitis can damage the lining of the colon or cause the colon to become very large (toxic megacolon). Older adults and people with certain medical conditions have a greater risk of getting C. difficile infections. CAUSES The balance of bacteria in your colon can change when you are sick, especially when taking antibiotic medicine. Taking antibiotics may allow the C. difficile to grow, multiply, and make a toxin that causes C. difficile infection.  SYMPTOMS  Diarrhea.  Fever.  Fatigue.  Loss of appetite.  Nausea.  Abdominal swelling, pain, or tenderness.  Dehydration. DIAGNOSIS Your health care provider may suspect C. difficile infection based on your symptoms and if you have taken antibiotics recently. Your health care provider may also order:  A lab test that can detect the toxin in your stool.  A sigmoidoscopy or colonoscopy to look at the appearance of your colon. These procedures involve passing an instrument through your rectum to look at the inside of your colon. Your health care provider will help determine if these tests are necessary. TREATMENT Treatment may include:  Taking antibiotics that keep C. difficile from growing.  Stopping the antibiotics you were on before the C. difficile infection began. Only do this if instructed to do so by your health care provider.  IV fluids and correction of electrolyte imbalance.  Surgery to remove the infected part of the intestines. This is rare. HOME CARE INSTRUCTIONS  Drink enough fluids to keep your urine clear or pale yellow. Avoid milk, caffeine, and alcohol.  Ask your health care provider for specific rehydration instructions.  Eat small,  frequent meals rather than large meals.  Take your antibiotics as directed. Finish them even if you start to feel better.  Do not use medicines to slow diarrhea. This could delay healing or cause problems.  Wash your hands thoroughly after using the bathroom and before preparing food. Make sure people who live with you wash their hands often, too.  Clean all surfaces with a product that contains chlorine bleach. SEEK MEDICAL CARE IF:  Your diarrhea lasts longer than expected or comes back after you finish your antibiotic medicine for the C. difficile infection.  You have trouble staying hydrated.  You have a fever. SEEK IMMEDIATE MEDICAL CARE IF:  You have increasing abdominal pain or tenderness.  You have blood in your stools, or your stools look dark black and tarry.  You cannot eat or drink without vomiting.   This information is not intended to replace advice given to you by your health care provider. Make sure you discuss any questions you have with your health care provider.   Document Released: 07/25/2005 Document Revised: 11/05/2014 Document Reviewed: 04/18/2015 Elsevier Interactive Patient Education 2016 [employer]  

## 2015-08-31 LAB — CBC
HEMATOCRIT: 43.5 % (ref 36.0–46.0)
HEMOGLOBIN: 13.9 g/dL (ref 12.0–15.0)
MCHC: 32 g/dL (ref 30.0–36.0)
MCV: 90.5 fl (ref 78.0–100.0)
Platelets: 411 10*3/uL — ABNORMAL HIGH (ref 150.0–400.0)
RBC: 4.8 Mil/uL (ref 3.87–5.11)
RDW: 15.4 % (ref 11.5–15.5)
WBC: 14.1 10*3/uL — ABNORMAL HIGH (ref 4.0–10.5)

## 2015-08-31 LAB — COMPREHENSIVE METABOLIC PANEL
ALT: 16 U/L (ref 0–35)
AST: 23 U/L (ref 0–37)
Albumin: 4.2 g/dL (ref 3.5–5.2)
Alkaline Phosphatase: 61 U/L (ref 39–117)
BILIRUBIN TOTAL: 0.2 mg/dL (ref 0.2–1.2)
BUN: 17 mg/dL (ref 6–23)
CHLORIDE: 97 meq/L (ref 96–112)
CO2: 42 meq/L — AB (ref 19–32)
CREATININE: 0.5 mg/dL (ref 0.40–1.20)
Calcium: 9.5 mg/dL (ref 8.4–10.5)
GFR: 128.5 mL/min (ref >60.00)
GLUCOSE: 111 mg/dL — AB (ref 70–99)
Potassium: 4.5 mEq/L (ref 3.5–5.1)
Sodium: 144 mEq/L (ref 135–145)
Total Protein: 7.3 g/dL (ref 6.0–8.3)

## 2015-08-31 LAB — TSH: TSH: 0.79 u[IU]/mL (ref 0.35–4.50)

## 2015-09-01 ENCOUNTER — Other Ambulatory Visit: Payer: Self-pay | Admitting: Family Medicine

## 2015-09-01 DIAGNOSIS — D72819 Decreased white blood cell count, unspecified: Secondary | ICD-10-CM

## 2015-09-06 ENCOUNTER — Other Ambulatory Visit: Payer: Medicare PPO

## 2015-09-06 DIAGNOSIS — G4700 Insomnia, unspecified: Secondary | ICD-10-CM | POA: Diagnosis not present

## 2015-09-06 DIAGNOSIS — D649 Anemia, unspecified: Secondary | ICD-10-CM | POA: Diagnosis not present

## 2015-09-06 DIAGNOSIS — K219 Gastro-esophageal reflux disease without esophagitis: Secondary | ICD-10-CM | POA: Diagnosis not present

## 2015-09-06 DIAGNOSIS — R197 Diarrhea, unspecified: Secondary | ICD-10-CM | POA: Diagnosis not present

## 2015-09-07 LAB — CLOSTRIDIUM DIFFICILE BY PCR: Toxigenic C. Difficile by PCR: NOT DETECTED

## 2015-09-10 DIAGNOSIS — R0602 Shortness of breath: Secondary | ICD-10-CM | POA: Diagnosis not present

## 2015-09-11 NOTE — Assessment & Plan Note (Addendum)
Improved but persistent. Current CDiff testing negative. Encouraged bland diet, probiotics and report worsening symptoms. WBC up likely due to steorid use but will repeat CBC

## 2015-09-11 NOTE — Progress Notes (Signed)
Subjective:    Patient ID: [name], female    DOB: 12-26-1940, 74 y.o.   MRN: [id number]  Chief Complaint  Patient presents with  . Follow-up    3 month    HPI Patient is in today for follow-up. She continues to struggle with some diarrhea but it is notably improved. No fevers or chills. She struggles with chronic fatigue but it is stable. No new complaints. Continues to have dyspnea with exertion but notes supplemental oxygen is very helpful. Roughly 2 loose stool daily at the present time. No bloody or tarry stool. Denies CP/palp/HA/congestion/fevers or GU c/o. Taking meds as prescribed  Past Medical History  Diagnosis Date  . COPD (chronic obstructive pulmonary disease) ([place])   . Thyroid disease   . Glucose intolerance (impaired glucose tolerance)   . History of small bowel obstruction   . Arthralgia   . Dyspnea   . Pallor   . Chest pain   . Osteoporosis   . Dysuria   . Weight loss   . Edema   . Weakness   . Anxiety   . Depression   . Migraine   . Hypertension   . Leukocytosis   . GERD (gastroesophageal reflux disease)   . Atrophic vaginitis 12/07/2012  . Hx of adenomatous colonic polyps   . Ischemic colitis ([place])   . Nocturia 09/02/2013  . Diarrhea 09/02/2013  . Hyperglycemia 12/01/2013  . Hypokalemia 11/28/2014  . Clostridium difficile diarrhea 12/27/2014    Past Surgical History  Procedure Laterality Date  . Cholecystectomy    . Abdominal hysterectomy  1993  . Tubal ligation    . Bladder repair    . Dilation and curettage of uterus      40 yrs ago  . Eye surgery  04/2011    bil. eyes  . Colonoscopy with propofol N/A 06/08/2013    Procedure: COLONOSCOPY WITH PROPOFOL;  Surgeon: Lafayette Dragon, MD;  Location: [facility];  Service: Endoscopy;  Laterality: N/A;    Family History  Problem Relation Age of Onset  . Alcohol abuse Mother   . Heart disease Mother   . Stroke Mother   . Hypertension Mother   . Kidney disease Mother   . COPD Mother   .  Osteoporosis Mother     hip fracture  . COPD Brother   . Alcohol abuse Brother     drug abuse  . Hearing loss Brother     mvp  . Colon cancer Paternal Aunt   . Prostate cancer    . Alcohol abuse Father     MVA  . Thyroid disease Daughter   . COPD Paternal Grandfather   . COPD Sister   . Hypertension Sister   . Anxiety disorder Sister   . COPD Sister   . COPD Brother     current smoker  . Hearing loss Brother     cad  . COPD Brother     Social History   Social History  . Marital Status: Married    Spouse Name: N/A  . Number of Children: 1  . Years of Education: N/A   Occupational History  . Retired Hydrologist    Social History Main Topics  . Smoking status: Former Smoker -- 2.00 packs/day for 40 years    Types: Cigarettes    Quit date: 10/29/2001  . Smokeless tobacco: Never Used  . Alcohol Use: No  . Drug Use: No  . Sexual Activity: Not on file  Other Topics Concern  . Not on file   Social History Narrative    Outpatient Prescriptions Prior to Visit  Medication Sig Dispense Refill  . albuterol (PROVENTIL HFA;VENTOLIN HFA) 108 (90 BASE) MCG/ACT inhaler Inhale 2 puffs into the lungs every 6 (six) hours as needed for wheezing or shortness of breath. 3 Inhaler 3  . albuterol (PROVENTIL) (2.5 MG/3ML) 0.083% nebulizer solution Take 3 mLs (2.5 mg total) by nebulization every 4 (four) hours as needed for wheezing or shortness of breath. Dx 496 300 mL 0  . ALPRAZolam (XANAX) 0.5 MG tablet 1 tab po bid and 2 tabs po qhs prn anxiety, insomnia 360 tablet 1  . arformoterol (BROVANA) 15 MCG/2ML NEBU Take 2 mLs (15 mcg total) by nebulization 2 (two) times daily. Dx 496 360 mL 3  . budesonide (PULMICORT) 0.25 MG/2ML nebulizer solution Take 2 mLs (0.25 mg total) by nebulization 2 (two) times daily. Dx: J44.9 360 mL 3  . Calcium Carbonate-Vitamin D 600-400 MG-UNIT per tablet Take 1 tablet by mouth 2 (two) times daily.      . fluticasone (FLONASE) 50 MCG/ACT  nasal spray Place 2 sprays into both nostrils 2 (two) times daily.    . Glucosamine-Chondroitin (GLUCOSAMINE CHONDR COMPLEX PO) Take 1 tablet by mouth 2 (two) times daily.     Marland Kitchen ipratropium-albuterol (DUONEB) 0.5-2.5 (3) MG/3ML SOLN Take 3 mLs by nebulization every 6 (six) hours as needed. 360 mL 0  . ondansetron (ZOFRAN) 4 MG tablet Take 1 tablet (4 mg total) by mouth every 8 (eight) hours as needed for nausea or vomiting. 9 tablet 0  . predniSONE (DELTASONE) 10 MG tablet Take 0.5 tablets (5 mg total) by mouth daily. 90 tablet 0  . citalopram (CELEXA) 10 MG tablet Take 1 tablet (10 mg total) by mouth daily. 30 tablet 5  . Ferrous Fumarate-Folic Acid (HEMOCYTE-F) 324-1 MG TABS Take 1 tablet by mouth daily. 30 each 5  . metoprolol succinate (TOPROL-XL) 25 MG 24 hr tablet Take 1 tablet (25 mg total) by mouth daily. 30 tablet 2  . mirtazapine (REMERON) 15 MG tablet Take 1 tablet (15 mg total) by mouth at bedtime. 90 tablet 1  . omeprazole-sodium bicarbonate (ZEGERID) 40-1100 MG per capsule Take 1 capsule by mouth 2 (two) times daily. 180 capsule 1   Facility-Administered Medications Prior to Visit  Medication Dose Route Frequency Provider Last Rate Last Dose  . Influenza Vac Split High-Dose SUSY   Intramuscular Once Mosie Lukes, MD        Allergies  Allergen Reactions  . Morphine Other (See Comments)    REACTION: nightmares  . Ambien [Zolpidem] Anxiety  . Cymbalta [Duloxetine Hcl] Anxiety  . Nitrofurantoin Rash    Review of Systems  Constitutional: Negative for fever and malaise/fatigue.  HENT: Negative for congestion.   Eyes: Negative for discharge.  Respiratory: Negative for shortness of breath.   Cardiovascular: Negative for chest pain, palpitations and leg swelling.  Gastrointestinal: Positive for diarrhea. Negative for heartburn, nausea, vomiting and abdominal pain.  Genitourinary: Negative for dysuria.  Musculoskeletal: Negative for falls.  Skin: Negative for rash.    Neurological: Negative for loss of consciousness and headaches.  Endo/Heme/Allergies: Negative for environmental allergies.  Psychiatric/Behavioral: Negative for depression. The patient is not nervous/anxious.        Objective:    Physical Exam  Constitutional: She is oriented to person, place, and time. She appears well-developed and well-nourished. No distress.  HENT:  Head: Normocephalic and atraumatic.  Nose: Nose  normal.  Eyes: Right eye exhibits no discharge. Left eye exhibits no discharge.  Neck: Normal range of motion. Neck supple.  Cardiovascular: Normal rate and regular rhythm.   No murmur heard. Pulmonary/Chest: Effort normal and breath sounds normal.  Abdominal: Soft. Bowel sounds are normal. There is no tenderness.  Musculoskeletal: She exhibits no edema.  Neurological: She is alert and oriented to person, place, and time.  Skin: Skin is warm and dry.  Psychiatric: She has a normal mood and affect.  Nursing note and vitals reviewed.   BP 132/72 mmHg  Pulse 91  Temp(Src) 98.2 F (36.8 C) (Oral)  Ht _0  (1.575 m)  Wt 98 lb 6 oz (44.623 kg)  BMI 17.99 kg/m2  SpO2 96% Wt Readings from Last 3 Encounters:  08/30/15 98 lb 6 oz (44.623 kg)  08/18/15 97 lb (43.999 kg)  05/19/15 95 lb 12.8 oz (43.455 kg)     Lab Results  Component Value Date   WBC 14.1* 08/30/2015   HGB 13.9 08/30/2015   HCT 43.5 08/30/2015   PLT 411.0* 08/30/2015   GLUCOSE 111* 08/30/2015   CHOL 171 11/12/2014   TRIG 384* 11/12/2014   HDL 65 11/12/2014   LDLDIRECT 71.8 08/04/2014   LDLCALC 29 11/12/2014   ALT 16 08/30/2015   AST 23 08/30/2015   NA 144 08/30/2015   K 4.5 08/30/2015   CL 97 08/30/2015   CREATININE 0.50 08/30/2015   BUN 17 08/30/2015   CO2 42* 08/30/2015   TSH 0.79 08/30/2015   HGBA1C 5.6 08/04/2014    Lab Results  Component Value Date   TSH 0.79 08/30/2015   Lab Results  Component Value Date   WBC 14.1* 08/30/2015   HGB 13.9 08/30/2015   HCT 43.5  08/30/2015   MCV 90.5 08/30/2015   PLT 411.0* 08/30/2015   Lab Results  Component Value Date   NA 144 08/30/2015   K 4.5 08/30/2015   CO2 42* 08/30/2015   GLUCOSE 111* 08/30/2015   BUN 17 08/30/2015   CREATININE 0.50 08/30/2015   BILITOT 0.2 08/30/2015   ALKPHOS 61 08/30/2015   AST 23 08/30/2015   ALT 16 08/30/2015   PROT 7.3 08/30/2015   ALBUMIN 4.2 08/30/2015   CALCIUM 9.5 08/30/2015   ANIONGAP 5 11/14/2014   GFR 128.50 08/30/2015   Lab Results  Component Value Date   CHOL 171 11/12/2014   Lab Results  Component Value Date   HDL 65 11/12/2014   Lab Results  Component Value Date   LDLCALC 29 11/12/2014   Lab Results  Component Value Date   TRIG 384* 11/12/2014   Lab Results  Component Value Date   CHOLHDL 2.6 11/12/2014   Lab Results  Component Value Date   HGBA1C 5.6 08/04/2014       Assessment & Plan:   Problem List Items Addressed This Visit    Insomnia   Relevant Orders   Clostridium Difficile by PCR (Completed)   Comp Met (CMET) (Completed)   TSH (Completed)   CBC (Completed)   HYPERTENSION, BENIGN ESSENTIAL    Well controlled, no changes to meds. Encouraged heart healthy diet such as the DASH diet and exercise as tolerated.       Relevant Medications   metoprolol succinate (TOPROL-XL) 25 MG 24 hr tablet   GERD   Relevant Medications   omeprazole-sodium bicarbonate (ZEGERID) 40-1100 MG capsule   Other Relevant Orders   Clostridium Difficile by PCR (Completed)   Comp Met (CMET) (Completed)  TSH (Completed)   CBC (Completed)   Diarrhea    Improved but persistent. Current CDiff testing negative. Encouraged bland diet, probiotics and report worsening symptoms. WBC up likely due to steorid use but will repeat CBC      Relevant Orders   Clostridium Difficile by PCR (Completed)   Comp Met (CMET) (Completed)   TSH (Completed)   CBC (Completed)   COPD (chronic obstructive pulmonary disease) ([place])    O2 dependent and doing well otherwise.  Will follow up with pulmonology      Anemia - Primary   Relevant Medications   Ferrous Fumarate-Folic Acid (HEMOCYTE-F) 324-1 MG TABS   Other Relevant Orders   Clostridium Difficile by PCR (Completed)   Comp Met (CMET) (Completed)   TSH (Completed)   CBC (Completed)      I have changed Ms. Rhue's omeprazole-sodium bicarbonate. I am also having her maintain her Calcium Carbonate-Vitamin D, Glucosamine-Chondroitin (GLUCOSAMINE CHONDR COMPLEX PO), albuterol, fluticasone, ipratropium-albuterol, predniSONE, ondansetron, arformoterol, albuterol, budesonide, ALPRAZolam, citalopram, Ferrous Fumarate-Folic Acid, metoprolol succinate, and mirtazapine. We will continue to administer Influenza Vac Split High-Dose.  Meds ordered this encounter  Medications  . citalopram (CELEXA) 10 MG tablet    Sig: Take 1 tablet (10 mg total) by mouth daily.    Dispense:  90 tablet    Refill:  3  . Ferrous Fumarate-Folic Acid (HEMOCYTE-F) 324-1 MG TABS    Sig: Take 1 tablet by mouth daily.    Dispense:  30 each    Refill:  5  . metoprolol succinate (TOPROL-XL) 25 MG 24 hr tablet    Sig: Take 1 tablet (25 mg total) by mouth daily.    Dispense:  90 tablet    Refill:  3  . DISCONTD: mirtazapine (REMERON) 15 MG tablet    Sig: Take 1 tablet (15 mg total) by mouth at bedtime.    Dispense:  90 tablet    Refill:  1  . omeprazole-sodium bicarbonate (ZEGERID) 40-1100 MG capsule    Sig: Take 1 capsule by mouth 2 (two) times daily.    Dispense:  180 capsule    Refill:  3  . mirtazapine (REMERON) 15 MG tablet    Sig: Take 1 tablet (15 mg total) by mouth at bedtime.    Dispense:  90 tablet    Refill:  1     Penni Homans, MD

## 2015-09-11 NOTE — Assessment & Plan Note (Signed)
O2 dependent and doing well otherwise. Will follow up with pulmonology

## 2015-09-11 NOTE — Assessment & Plan Note (Signed)
Well controlled, no changes to meds. Encouraged heart healthy diet such as the DASH diet and exercise as tolerated.  °

## 2015-09-12 ENCOUNTER — Telehealth: Payer: Self-pay | Admitting: Family Medicine

## 2015-09-12 NOTE — Telephone Encounter (Signed)
Patient has been contacted and has CBC appointment made already for 09/15/15 for repeat CBC.  Patient is doing well this morning.

## 2015-09-12 NOTE — Telephone Encounter (Signed)
-----   Message from Mosie Lukes, MD sent at 09/11/2015 12:57 PM EST ----- Her cdiff was neg but her WBC was up see if she is doing all right and see if she will repeat a CBC this week

## 2015-09-12 NOTE — Telephone Encounter (Signed)
Called the patient , but patient still in bed husband stated to call back later this am.

## 2015-09-15 ENCOUNTER — Other Ambulatory Visit (INDEPENDENT_AMBULATORY_CARE_PROVIDER_SITE_OTHER): Payer: Medicare PPO

## 2015-09-15 DIAGNOSIS — D72819 Decreased white blood cell count, unspecified: Secondary | ICD-10-CM | POA: Diagnosis not present

## 2015-09-16 LAB — CBC WITH DIFFERENTIAL/PLATELET
BASOS ABS: 0 10*3/uL (ref 0.0–0.1)
Basophils Relative: 0.2 % (ref 0.0–3.0)
EOS ABS: 0.1 10*3/uL (ref 0.0–0.7)
Eosinophils Relative: 0.8 % (ref 0.0–5.0)
HEMATOCRIT: 42 % (ref 36.0–46.0)
HEMOGLOBIN: 13.5 g/dL (ref 12.0–15.0)
LYMPHS PCT: 14.9 % (ref 12.0–46.0)
Lymphs Abs: 1.8 10*3/uL (ref 0.7–4.0)
MCHC: 32.2 g/dL (ref 30.0–36.0)
MCV: 91.6 fl (ref 78.0–100.0)
Monocytes Absolute: 0.4 10*3/uL (ref 0.1–1.0)
Monocytes Relative: 2.9 % — ABNORMAL LOW (ref 3.0–12.0)
Neutro Abs: 9.8 10*3/uL — ABNORMAL HIGH (ref 1.4–7.7)
Neutrophils Relative %: 81.2 % — ABNORMAL HIGH (ref 43.0–77.0)
Platelets: 366 10*3/uL (ref 150.0–400.0)
RBC: 4.58 Mil/uL (ref 3.87–5.11)
RDW: 14.7 % (ref 11.5–15.5)
WBC: 12 10*3/uL — AB (ref 4.0–10.5)

## 2015-09-30 ENCOUNTER — Encounter: Payer: Self-pay | Admitting: Family Medicine

## 2015-10-03 ENCOUNTER — Other Ambulatory Visit: Payer: Self-pay | Admitting: Family Medicine

## 2015-10-03 DIAGNOSIS — Z1239 Encounter for other screening for malignant neoplasm of breast: Secondary | ICD-10-CM

## 2015-10-06 ENCOUNTER — Ambulatory Visit (INDEPENDENT_AMBULATORY_CARE_PROVIDER_SITE_OTHER): Payer: Medicare PPO | Admitting: Adult Health

## 2015-10-06 ENCOUNTER — Encounter: Payer: Self-pay | Admitting: Adult Health

## 2015-10-06 VITALS — BP 148/82 | HR 109 | Ht 63.0 in | Wt 101.0 lb

## 2015-10-06 DIAGNOSIS — J441 Chronic obstructive pulmonary disease with (acute) exacerbation: Secondary | ICD-10-CM

## 2015-10-06 MED ORDER — PREDNISONE 10 MG PO TABS
ORAL_TABLET | ORAL | Status: DC
Start: 2015-10-06 — End: 2015-10-27

## 2015-10-06 NOTE — Assessment & Plan Note (Signed)
Flare   Plan  Increase Prednisone 40mg  daily for 4 days , 30 mg daily for 4 days, then 20mg  daily for 4 days and 10mg  daily for 4 days then back to 5mg  daily  Continue on Brovana and Budesonide Neb Twice daily  .  Continue on 2 L rest and 3l/m activity .  Follow up Dr. Elsworth Soho  3 months and As needed   Please contact office for sooner follow up if symptoms do not improve or worsen or seek emergency care

## 2015-10-06 NOTE — Progress Notes (Signed)
   Subjective:    Patient ID: [name], female    DOB: 10-11-1941, 74 y.o.   MRN: [id number]  HPI 56 yowf , heavy ex- smoker, quit 2002 with GOLD D COPD FEV1 26% 6/10 on 24 h O2 since 2009.  -completed pulm rehab oct '11  Recurrent flares 2-3/yr requiring steroid bursts  She uses 3 liters 24/7. On a regimen of budesonide/brovana & alb/atrovent nebs   Significant tests/ events  Admitted 12/2012 for COPD exacerbation .ABG 7.39/55/75 on 3L [place]  She required low dose prednisone since her admit in 02/2013   Had colonoscopy with polyp removal 06/2013  08/2013 BL pneumonia  03/2014 ONO on 2L >> no sig desatn  CT chest 10/2014 neg PE  12/06/2014 >>2.5 L at rest , 3L on exertion - ONO ok in the past    10/06/2015 Acute OV : COPD and Chronic Resp Failure on O2  Pt presents for 1 week acute office visit.  Complains over last 1 week of cough,and wheezing. Cough is dry .  No discolored mucus or feve.r  Remains on brovana/budesonide neb Twice daily   On Prednisone 5mg  daily .  Flu /Prevnar/PVX utd.  Denies chest pain, orthopnea, edema or fever or discolored mucus .  Eating well.    Had C Diff in summer 2016, resolved . We discussed cautious use of abx .   Review of Systems neg for any significant sore throat, dysphagia, itching, sneezing, nasal congestion or excess/ purulent secretions, fever, chills, sweats, unintended wt loss, pleuritic or exertional cp, hempoptysis, orthopnea pnd or change in chronic leg swelling. Also denies presyncope, palpitations, heartburn, abdominal pain, nausea, vomiting, diarrhea or change in bowel or urinary habits, dysuria,hematuria, rash, arthralgias, visual complaints, headache, numbness weakness or ataxia.     Objective:   Physical Exam  Gen. Pleasant, frail /elderly  in no distress VS reviewed  ENT - no lesions, no post nasal drip Neck: No JVD, no thyromegaly, no carotid bruits Lungs: no use of accessory muscles, no dullness to percussion,  decreased BS bases , w/ faint exp wheeze on forced exp .  Cardiovascular: Rhythm regular, heart sounds  normal, no murmurs or gallops, no peripheral edema Musculoskeletal: No deformities, no cyanosis or clubbing        Assessment & Plan:

## 2015-10-06 NOTE — Patient Instructions (Addendum)
Increase Prednisone 40mg  daily for 4 days , 30 mg daily for 4 days, then 20mg  daily for 4 days and 10mg  daily for 4 days then back to 5mg  daily  Continue on Brovana and Budesonide Neb Twice daily  .  Continue on 2 L rest and 3l/m activity .  Follow up Dr. Elsworth Soho  3 months and As needed   Please contact office for sooner follow up if symptoms do not improve or worsen or seek emergency care

## 2015-10-07 NOTE — Progress Notes (Signed)
Reviewed & agree with plan  

## 2015-10-10 DIAGNOSIS — R0602 Shortness of breath: Secondary | ICD-10-CM | POA: Diagnosis not present

## 2015-10-11 ENCOUNTER — Telehealth: Payer: Self-pay | Admitting: Adult Health

## 2015-10-11 DIAGNOSIS — J449 Chronic obstructive pulmonary disease, unspecified: Secondary | ICD-10-CM

## 2015-10-11 MED ORDER — ARFORMOTEROL TARTRATE 15 MCG/2ML IN NEBU: 15.0000 ug | INHALATION_SOLUTION | Freq: Two times a day (BID) | RESPIRATORY_TRACT | Status: DC

## 2015-10-11 NOTE — Telephone Encounter (Signed)
Spoke with pt. She needs a refill on Brovana. This has been sent in. Nothing further was needed.

## 2015-10-13 ENCOUNTER — Ambulatory Visit (HOSPITAL_BASED_OUTPATIENT_CLINIC_OR_DEPARTMENT_OTHER): Payer: Medicare PPO

## 2015-10-18 ENCOUNTER — Ambulatory Visit (HOSPITAL_BASED_OUTPATIENT_CLINIC_OR_DEPARTMENT_OTHER): Payer: Medicare PPO

## 2015-10-27 ENCOUNTER — Ambulatory Visit (INDEPENDENT_AMBULATORY_CARE_PROVIDER_SITE_OTHER): Payer: Medicare PPO | Admitting: Internal Medicine

## 2015-10-27 ENCOUNTER — Encounter: Payer: Self-pay | Admitting: Internal Medicine

## 2015-10-27 ENCOUNTER — Ambulatory Visit (INDEPENDENT_AMBULATORY_CARE_PROVIDER_SITE_OTHER)
Admission: RE | Admit: 2015-10-27 | Discharge: 2015-10-27 | Disposition: A | Discharge status: Home / Self Care | Payer: Medicare PPO | Source: Ambulatory Visit | Attending: Internal Medicine | Admitting: Internal Medicine

## 2015-10-27 VITALS — BP 116/80 | HR 88 | Wt 102.2 lb

## 2015-10-27 DIAGNOSIS — J441 Chronic obstructive pulmonary disease with (acute) exacerbation: Secondary | ICD-10-CM | POA: Diagnosis not present

## 2015-10-27 DIAGNOSIS — M898X1 Other specified disorders of bone, shoulder: Secondary | ICD-10-CM | POA: Diagnosis not present

## 2015-10-27 DIAGNOSIS — R079 Chest pain, unspecified: Secondary | ICD-10-CM

## 2015-10-27 DIAGNOSIS — R0789 Other chest pain: Secondary | ICD-10-CM

## 2015-10-27 DIAGNOSIS — J449 Chronic obstructive pulmonary disease, unspecified: Secondary | ICD-10-CM | POA: Diagnosis not present

## 2015-10-27 DIAGNOSIS — R0781 Pleurodynia: Secondary | ICD-10-CM | POA: Diagnosis not present

## 2015-10-27 DIAGNOSIS — J9611 Chronic respiratory failure with hypoxia: Secondary | ICD-10-CM

## 2015-10-27 MED ORDER — AZITHROMYCIN 250 MG PO TABS: ORAL_TABLET | ORAL | Status: DC

## 2015-10-27 NOTE — Patient Instructions (Addendum)
Order- CXR  With left rib details      COPD exacerbation, left scapula pain  Script sent for Zpak  Continue taking your probiotic  Mucinex or mucinex-dm may help loosen your cough so you can clear your chest  Keep januaryu appointment with Nurse Practioner

## 2015-10-27 NOTE — Progress Notes (Signed)
Subjective:    Patient ID: [name], female    DOB: 21-Jun-1941, 74 y.o.   MRN: [id number]  HPI 89 yowf , heavy ex- smoker, quit 2002 with GOLD D COPD FEV1 26% 6/10 on 24 h O2 since 2009.  -completed pulm rehab oct '11  Recurrent flares 2-3/yr requiring steroid bursts  She uses 3 liters 24/7. On a regimen of budesonide/brovana & alb/atrovent nebs   Significant tests/ events  Admitted 12/2012 for COPD exacerbation .ABG 7.39/55/75 on 3L [place]  She required low dose prednisone since her admit in 02/2013   Had colonoscopy with polyp removal 06/2013  08/2013 BL pneumonia  03/2014 ONO on 2L >> no sig desatn  CT chest 10/2014 neg PE  12/06/2014 >>2.5 L at rest , 3L on exertion - ONO ok in the past    10/06/2015 Acute OV : COPD and Chronic Resp Failure on O2  Pt presents for 1 week acute office visit.  Complains over last 1 week of cough,and wheezing. Cough is dry .  No discolored mucus or feve.r  Remains on brovana/budesonide neb Twice daily   On Prednisone 5mg  daily .  Flu /Prevnar/PVX utd.  Denies chest pain, orthopnea, edema or fever or discolored mucus .  Eating well.    Had C Diff in summer 2016, resolved . We discussed cautious use of abx .  10/27/2015-74 year old female former smoker-followed for COPD, chronic respiratory failure with hypoxia  O2 3 L/[employer]  ACUTE VISIT: RA patient: increased SOB for about 6 weeks now;gets feverish in evening time and upper back pain. Fatigued quiet often. Husband with her today is also on oxygen Describes left scapular pain for several weeks gradually worse? Muscle. Easy dyspnea on exertion and fatigue are baseline. After recent prednisone taper she is now back on maintenance 10 mg daily. Had C. difficile last year so wants minimum antibiotics. Using nebulizer several times daily including Brovana and Pulmicort  ROS-see HPI   Negative unless "+" Constitutional:    weight loss, night sweats, fevers, chills, fatigue,  lassitude. HEENT:    headaches, difficulty swallowing, tooth/dental problems, sore throat,       sneezing, itching, ear ache, nasal congestion, post nasal drip, snoring CV:    chest pain, orthopnea, PND, swelling in lower extremities, anasarca,                                                    dizziness, palpitations Resp:   + shortness of breath with exertion or at rest.                productive cough,   non-productive cough, coughing up of blood.              change in color of mucus.  + wheezing.   Skin:    rash or lesions. GI:  No-   heartburn, indigestion, abdominal pain, nausea, vomiting,  GU:  MS:   joint pain, stiffness, decreased range of motion, back pain. Neuro-     nothing unusual Psych:  change in mood or affect.  depression or anxiety.   memory loss.  OBJ- Physical Exam General- Alert, Oriented, Affect-appropriate, Distress- none acute Skin- rash-none, lesions- none, excoriation- none Lymphadenopathy- none Head- atraumatic            Eyes- Gross vision intact, PERRLA, conjunctivae and secretions  clear            Ears- Hearing, canals-normal            Nose- Clear, no-Septal dev, mucus, polyps, erosion, perforation             Throat- Mallampati II , mucosa clear , drainage- none, tonsils- atrophic Neck- flexible , trachea midline, no stridor , thyroid nl, carotid no bruit Chest - symmetrical excursion , unlabored           Heart/CV- RRR , no murmur , no gallop  , no rub, nl s1 s2                           - JVD- none , edema- none, stasis changes- none, varices- none           Lung- clear to P&A, wheeze- none, cough- none , dullness-none, rub- none           Chest wall-  Abd-  Br/ Gen/ Rectal- Not done, not indicated Extrem- cyanosis- none, clubbing, none, atrophy- none, strength- nl Neuro- grossly intact to observation     Objective:   Physical Exam  OBJ- Physical Exam General- Alert, Oriented, Affect-appropriate, Distress- none acute. + wheelchair Skin-  rash-none, lesions- none, excoriation- none Lymphadenopathy- none Head- atraumatic            Eyes- Gross vision intact, PERRLA, conjunctivae and secretions clear            Ears- Hearing, canals-normal            Nose- Clear, no-Septal dev, mucus, polyps, erosion, perforation             Throat- Mallampati II , mucosa clear , drainage- none, tonsils- atrophic Neck- flexible , trachea midline, no stridor , thyroid nl, carotid no bruit Chest - symmetrical excursion , unlabored           Heart/CV- RRR , no murmur , no gallop  , no rub, nl s1 s2                           - JVD- none , edema- none, stasis changes- none, varices- none           Lung- clear to P&A,, cough+ dry wheezy, dullness-none, rub- none, + O2 3L           Chest wall-  Abd-  Br/ Gen/ Rectal- Not done, not indicated Extrem- cyanosis- none, clubbing, none, atrophy- none, strength- nl Neuro- grossly intact to observation    Assessment & Plan:

## 2015-10-31 DIAGNOSIS — R0789 Other chest pain: Secondary | ICD-10-CM

## 2015-10-31 DIAGNOSIS — R079 Chest pain, unspecified: Secondary | ICD-10-CM | POA: Insufficient documentation

## 2015-10-31 NOTE — Assessment & Plan Note (Signed)
Chronic respiratory failure with hypoxia Oxygen at 3 L seems sufficient

## 2015-10-31 NOTE — Assessment & Plan Note (Addendum)
Persistent nonexertional, nonpleuritic soreness in area of the left scapula I suspect this is musculoskeletal Plan-chest x-ray with rib details, heating pad. If this continues may need to consider CT chest to exclude something like a bone met

## 2015-10-31 NOTE — Assessment & Plan Note (Signed)
I don't think her dyspnea on exertion has changed much. She is limited to wheelchair most of the time. Plan-chest x-ray, Mucinex, Z-Pak with probiotic

## 2015-11-10 DIAGNOSIS — R0602 Shortness of breath: Secondary | ICD-10-CM | POA: Diagnosis not present

## 2015-11-17 ENCOUNTER — Ambulatory Visit (INDEPENDENT_AMBULATORY_CARE_PROVIDER_SITE_OTHER): Payer: Medicare PPO | Admitting: Adult Health

## 2015-11-17 ENCOUNTER — Encounter: Payer: Self-pay | Admitting: Adult Health

## 2015-11-17 VITALS — BP 128/82 | HR 78 | Temp 98.7°F | Ht 61.0 in | Wt 99.0 lb

## 2015-11-17 DIAGNOSIS — J449 Chronic obstructive pulmonary disease, unspecified: Secondary | ICD-10-CM | POA: Diagnosis not present

## 2015-11-17 DIAGNOSIS — J9611 Chronic respiratory failure with hypoxia: Secondary | ICD-10-CM | POA: Diagnosis not present

## 2015-11-17 MED ORDER — PREDNISONE 10 MG PO TABS: 10.0000 mg | ORAL_TABLET | Freq: Every day | ORAL | Status: DC

## 2015-11-17 NOTE — Patient Instructions (Addendum)
Increase Prednisone 10mg  daily for 3 weeks then back to 5mg  daily  Add saline nasal rinses Twice daily  .  Continue on Brovana and Budesonide Neb Twice daily  .  Continue on 2 L rest and 3l/m activity .  Follow up Dr. Elsworth Soho  3 months and As needed   Please contact office for sooner follow up if symptoms do not improve or worsen or seek emergency care

## 2015-11-17 NOTE — Assessment & Plan Note (Signed)
Recent flare now slowly resolving  Will adjust prednisone base dose for 10mg  for few weeks to see if this help with wheezing/dyspnea  Plan  ncrease Prednisone 10mg  daily for 3 weeks then back to 5mg  daily  Add saline nasal rinses Twice daily  .  Continue on Brovana and Budesonide Neb Twice daily  .  Continue on 2 L rest and 3l/m activity .  Follow up Dr. Elsworth Soho  3 months and As needed   Please contact office for sooner follow up if symptoms do not improve or worsen or seek emergency care

## 2015-11-17 NOTE — Assessment & Plan Note (Signed)
Continue on 2 L rest and 3l/m activity .  Follow up Dr. Elsworth Soho  3 months and As needed   Please contact office for sooner follow up if symptoms do not improve or worsen or seek emergency care

## 2015-11-17 NOTE — Progress Notes (Addendum)
Subjective:    Patient ID: XYA GENUNG, female    DOB: [birth year], 75 y.o.   MRN: [id number]  HPI 22 yowf , heavy ex- smoker, quit 2002 with GOLD D COPD FEV1 26% 6/10 on 24 h O2 since 2009.  -completed pulm rehab oct '11  Recurrent flares 2-3/yr requiring steroid bursts  She uses 3 liters 24/7. On a regimen of budesonide/brovana & alb/atrovent nebs   Significant tests/ events  Admitted 12/2012 for COPD exacerbation .ABG 7.39/55/75 on 3L [place]  She required low dose prednisone since her admit in 02/2013   Had colonoscopy with polyp removal 06/2013  08/2013 BL pneumonia  03/2014 ONO on 2L >> no sig desatn  CT chest 10/2014 neg PE  12/06/2014 >>2.5 L at rest , 3L on exertion - ONO ok in the past    11/17/2015 Follow up : COPD and Chronic Resp Failure on O2  Pt returns for  3 week follow up.  Pt c/o chest congestion/tightness, non prod cough, wheezing, headaches, increased SOB with activity, and sinus drainage. Completed zpak given on 10/27/15. Denies any sinus congestion, fever, nausea or vomiting. CXR on 10/27/15 with COPD changes .  Is slightly better but remains weak with DOE.  Remains on brovana/budesonide neb Twice daily   On Prednisone 5mg  daily .  Flu /Prevnar/PVX utd.  Denies chest pain, orthopnea, edema or fever or discolored mucus .     Had C Diff in summer 2016, resolved . We discussed cautious use of abx .  Addendum note done per requirements of DME . Pt has end stage COPD and is very weak . She requests a wheelchair as she can not walk long distances and needs assistance due to severe dyspnea, muscle weakness, extreme deconditioning. She is not able to handle a large wheelchair therefore would like a pediatric chair that would work better for her as she is only 99lbs . She has to wear continuous oxygen and makes it very difficult for her to handle walking with oxygen. Therefore order has been placed for pediatric wheelchair.    Past Medical History  Diagnosis Date    . COPD (chronic obstructive pulmonary disease) ([place])   . Thyroid disease   . Glucose intolerance (impaired glucose tolerance)   . History of small bowel obstruction   . Arthralgia   . Dyspnea   . Pallor   . Chest pain   . Osteoporosis   . Dysuria   . Weight loss   . Edema   . Weakness   . Anxiety   . Depression   . Migraine   . Hypertension   . Leukocytosis   . GERD (gastroesophageal reflux disease)   . Atrophic vaginitis 12/07/2012  . Hx of adenomatous colonic polyps   . Ischemic colitis ([place])   . Nocturia 09/02/2013  . Diarrhea 09/02/2013  . Hyperglycemia 12/01/2013  . Hypokalemia 11/28/2014  . Clostridium difficile diarrhea 12/27/2014   Current Outpatient Prescriptions on File Prior to Visit  Medication Sig Dispense Refill  . albuterol (PROVENTIL HFA;VENTOLIN HFA) 108 (90 BASE) MCG/ACT inhaler Inhale 2 puffs into the lungs every 6 (six) hours as needed for wheezing or shortness of breath. 3 Inhaler 3  . albuterol (PROVENTIL) (2.5 MG/3ML) 0.083% nebulizer solution Take 3 mLs (2.5 mg total) by nebulization every 4 (four) hours as needed for wheezing or shortness of breath. Dx 496 300 mL 0  . ALPRAZolam (XANAX) 0.5 MG tablet 1 tab po bid and 2 tabs po qhs prn anxiety,  insomnia 360 tablet 1  . arformoterol (BROVANA) 15 MCG/2ML NEBU Take 2 mLs (15 mcg total) by nebulization 2 (two) times daily. Dx J44.9 360 mL 3  . budesonide (PULMICORT) 0.25 MG/2ML nebulizer solution Take 2 mLs (0.25 mg total) by nebulization 2 (two) times daily. Dx: J44.9 360 mL 3  . Calcium Carbonate-Vitamin D 600-400 MG-UNIT per tablet Take 1 tablet by mouth 2 (two) times daily.      . citalopram (CELEXA) 10 MG tablet Take 1 tablet (10 mg total) by mouth daily. 90 tablet 3  . Ferrous Fumarate-Folic Acid (HEMOCYTE-F) 324-1 MG TABS Take 1 tablet by mouth daily. 30 each 5  . fluticasone (FLONASE) 50 MCG/ACT nasal spray Place 2 sprays into both nostrils 2 (two) times daily.    . Glucosamine-Chondroitin (GLUCOSAMINE  CHONDR COMPLEX PO) Take 1 tablet by mouth 2 (two) times daily.     . metoprolol succinate (TOPROL-XL) 25 MG 24 hr tablet Take 1 tablet (25 mg total) by mouth daily. 90 tablet 3  . mirtazapine (REMERON) 15 MG tablet Take 1 tablet (15 mg total) by mouth at bedtime. 90 tablet 1  . omeprazole-sodium bicarbonate (ZEGERID) 40-1100 MG capsule Take 1 capsule by mouth 2 (two) times daily. 180 capsule 3  . ondansetron (ZOFRAN) 4 MG tablet Take 1 tablet (4 mg total) by mouth every 8 (eight) hours as needed for nausea or vomiting. 9 tablet 0   No current facility-administered medications on file prior to visit.      Review of Systems neg for any significant sore throat, dysphagia, itching, sneezing, nasal congestion or excess/ purulent secretions, fever, chills, sweats, unintended wt loss, pleuritic or exertional cp, hempoptysis, orthopnea pnd or change in chronic leg swelling. Also denies presyncope, palpitations, heartburn, abdominal pain, nausea, vomiting, diarrhea or change in bowel or urinary habits, dysuria,hematuria, rash, arthralgias, visual complaints, headache, numbness weakness or ataxia.     Objective:   Physical Exam Filed Vitals:   11/17/15 1043  BP: 128/82  Pulse: 78  Temp: 98.7 F (37.1 C)  TempSrc: Oral  Height: 5\' 1"  (1.549 m)  Weight: 99 lb (44.906 kg)  SpO2: 98%    Gen. Pleasant, frail /elderly  in no distress VS reviewed  ENT - no lesions, no post nasal drip Neck: No JVD, no thyromegaly, no carotid bruits Lungs: no use of accessory muscles, no dullness to percussion, decreased BS bases , w/ faint exp wheeze on forced exp .  Cardiovascular: Rhythm regular, heart sounds  normal, no murmurs or gallops, no peripheral edema Musculoskeletal: No deformities, no cyanosis or clubbing        Assessment & Plan:

## 2015-11-20 NOTE — Progress Notes (Signed)
Reviewed & agree with plan  

## 2015-11-27 IMAGING — CT CT ANGIO CHEST
2 of 9 series · 18 of 46 positions shown · IV contrast (OMNI)
Comparison: Chest radiograph performed 11/13/2014

CLINICAL DATA: Acute onset of shortness of breath and mild
wheezing. Elevated D-dimer. Initial encounter.

EXAM:
CT ANGIOGRAPHY CHEST WITH CONTRAST
TECHNIQUE: Multidetector CT imaging of the chest was performed using the
standard protocol during bolus administration of intravenous
contrast. Multiplanar CT image reconstructions and MIPs were
obtained to evaluate the vascular anatomy.
CONTRAST:  75mL OMNIPAQUE IOHEXOL 350 MG/ML SOLN

[Series 5: thins · axial · 0.56mm/px · z∈[-834,-533]mm · 15 of 339 slices shown]
[im 19/339  lung]
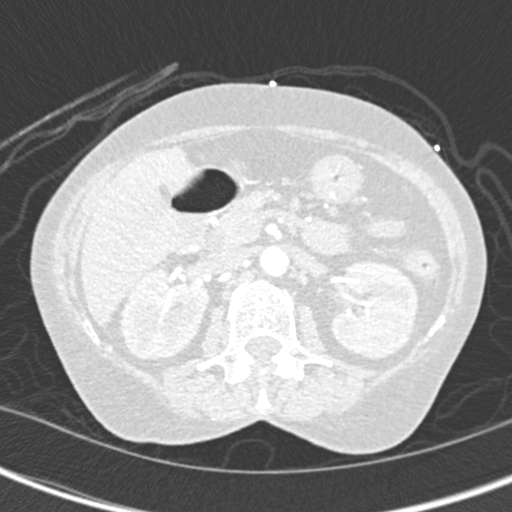
[im 38/339  soft-tissue]
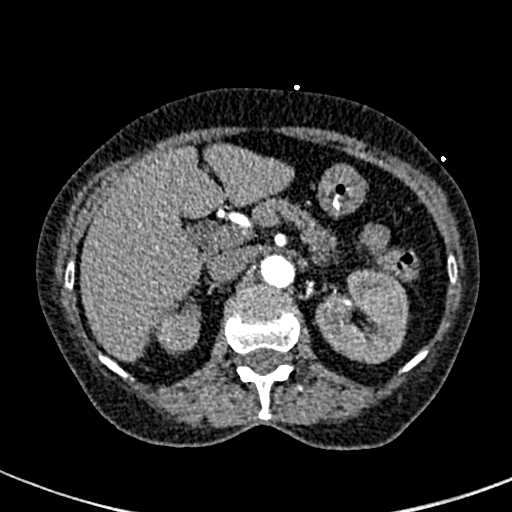
[im 57/339  lung]
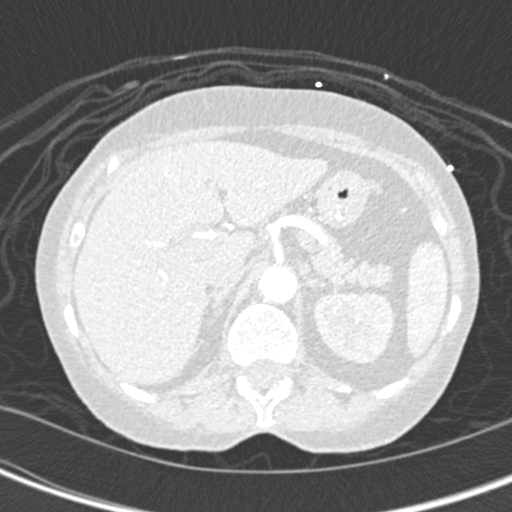
[im 76/339  soft-tissue]
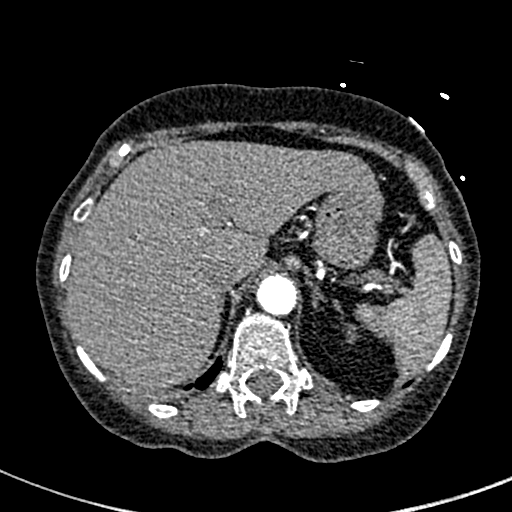
[im 113/339  lung]
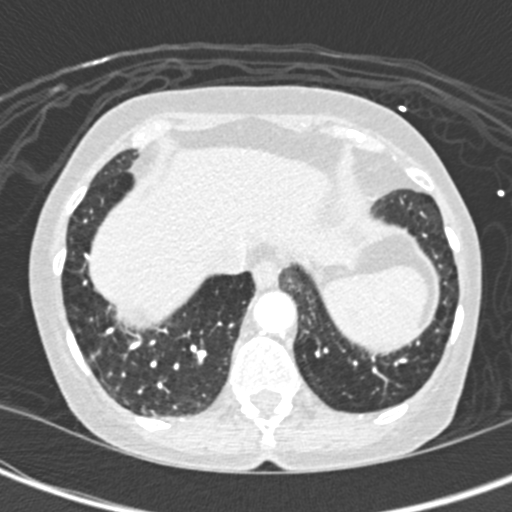
[im 132/339  soft-tissue]
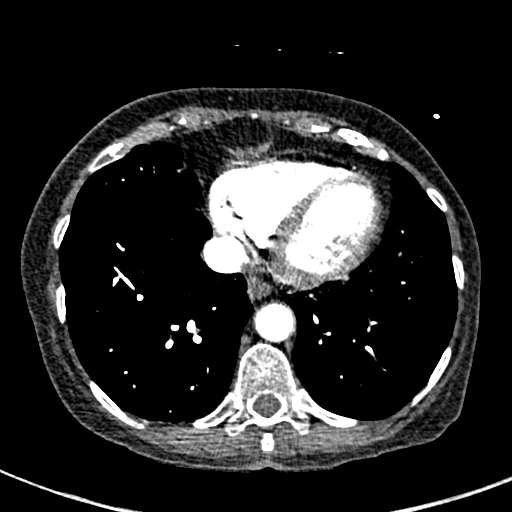
[im 151/339  lung]
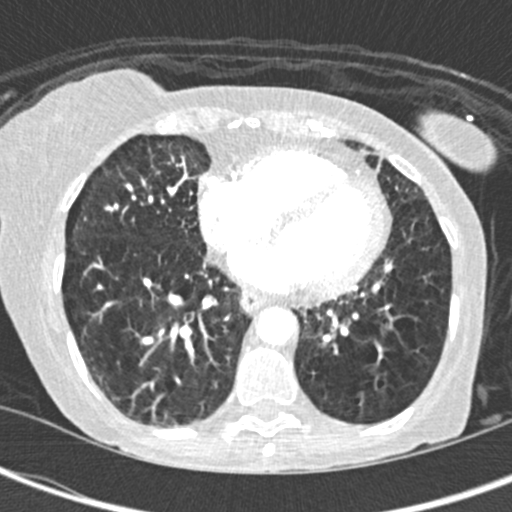
[im 170/339  soft-tissue]
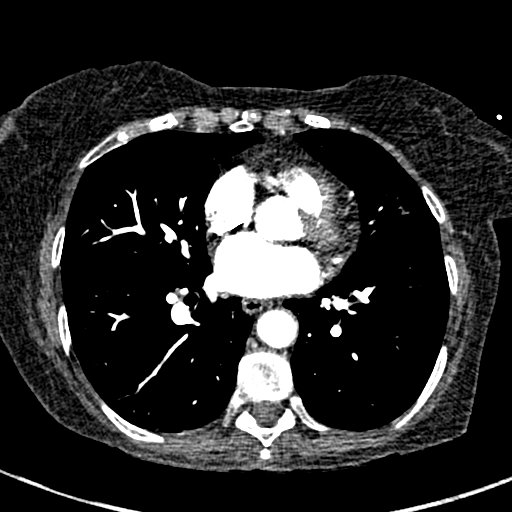
[im 188/339  lung]
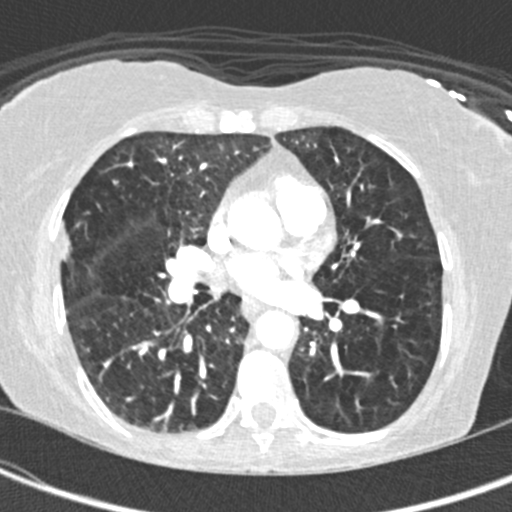
[im 207/339  soft-tissue]
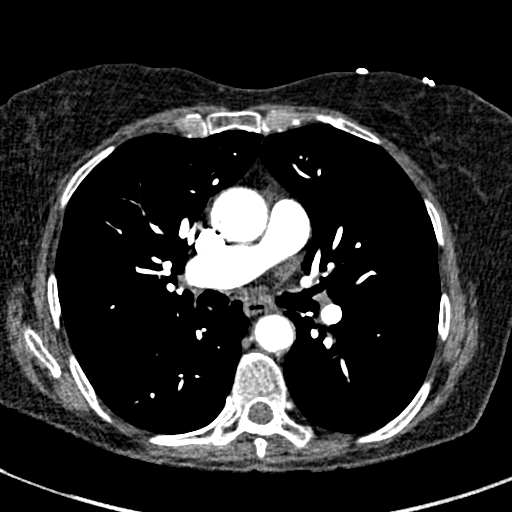
[im 226/339  lung]
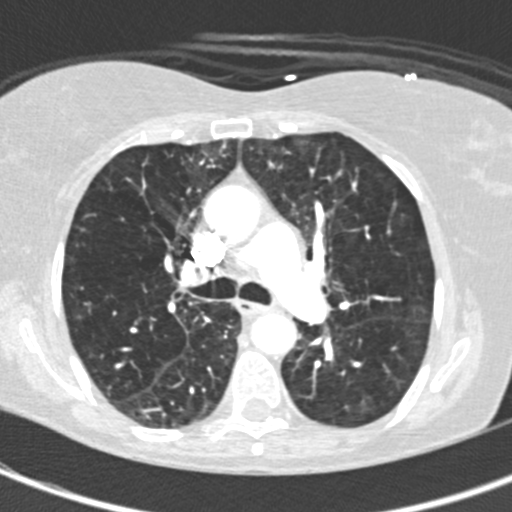
[im 263/339  soft-tissue]
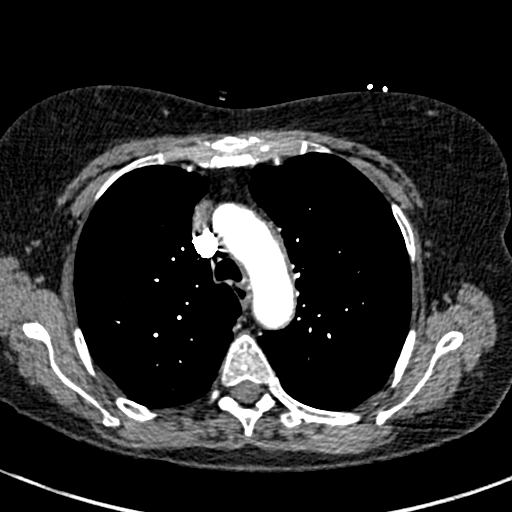
[im 282/339  lung]
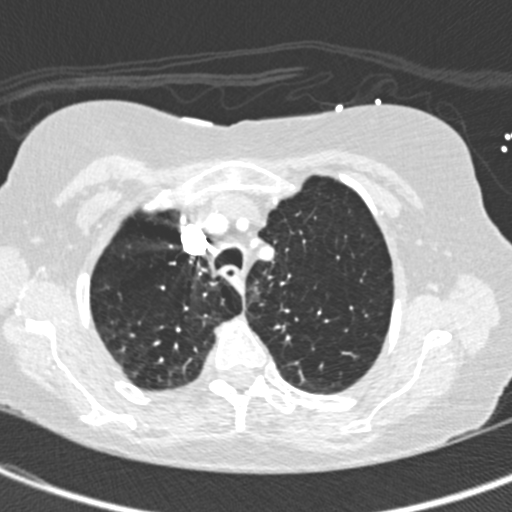
[im 301/339  soft-tissue]
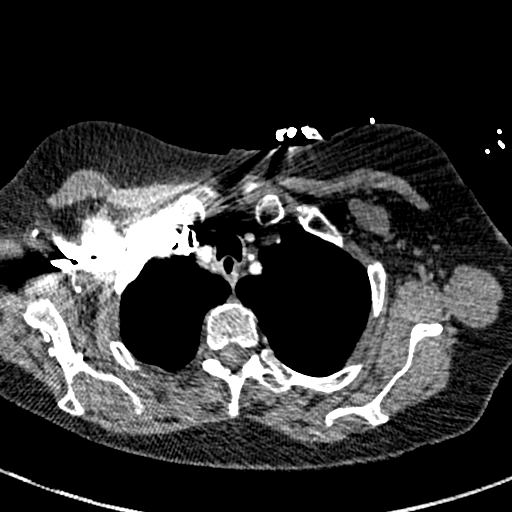
[im 320/339  lung]
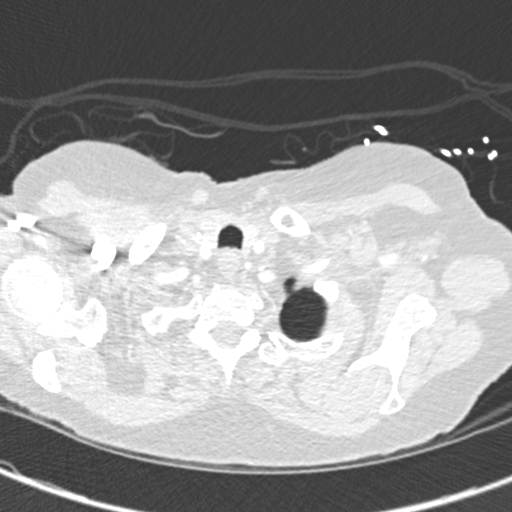

[Series 7: coronal mpr · coronal · 0.55mm/px · 3 of 105 slices shown]
[im 27/105  soft-tissue]
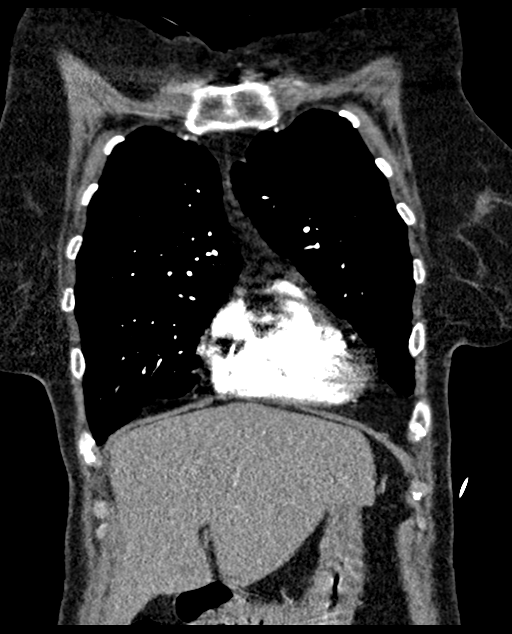
[im 53/105  soft-tissue]
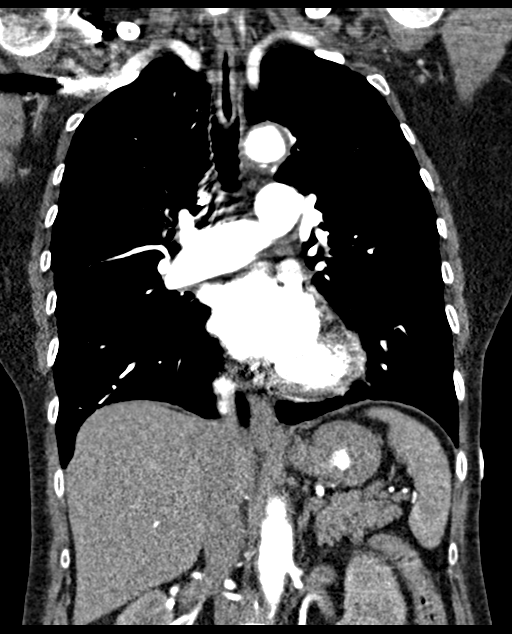
[im 79/105  soft-tissue]
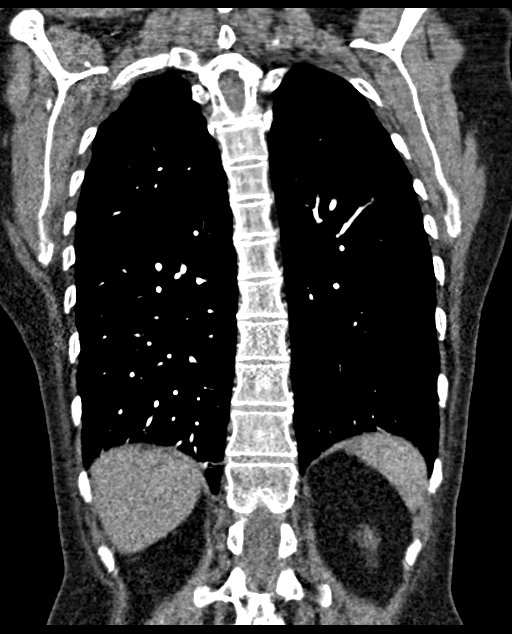

[18 of 46 positions shown; findings below may reference images not displayed]

FINDINGS: There is no evidence of pulmonary embolus.

Bilateral emphysematous change is noted. Minimal peripheral
atelectasis or scarring is seen. There is no evidence of significant
focal consolidation, pleural effusion or pneumothorax. No masses are
identified; no abnormal focal contrast enhancement is seen.

The mediastinum is unremarkable in appearance. No mediastinal
lymphadenopathy is seen. The great vessels are grossly unremarkable.
Incidental note is made of a direct origin of the left vertebral
artery from the aortic arch. No axillary lymphadenopathy is seen.
The visualized portions of the thyroid gland are unremarkable in
appearance.

The visualized portions of the liver and spleen are unremarkable.
The patient is status post cholecystectomy, with clips noted along
the gallbladder fossa. The visualized portions of the pancreas,
adrenal glands and kidneys are within normal limits.

No acute osseous abnormalities are seen.

Review of the MIP images confirms the above findings.
IMPRESSION: 1. No evidence of pulmonary embolus.
2. Bilateral emphysematous change noted. Minimal peripheral
atelectasis or scarring seen. Lungs otherwise clear.

## 2015-12-06 ENCOUNTER — Telehealth: Payer: Self-pay | Admitting: Pulmonary Disease

## 2015-12-06 NOTE — Telephone Encounter (Signed)
Pt states [employer] needs clinical notes before they can deliver pediatric wheelchair.  Order was placed per TP at ov on 11/17/15. Can you create addendum to this note stating pt's need for pediatric wheelchair?

## 2015-12-06 NOTE — Telephone Encounter (Signed)
Yes of course

## 2015-12-11 DIAGNOSIS — R0602 Shortness of breath: Secondary | ICD-10-CM | POA: Diagnosis not present

## 2015-12-21 ENCOUNTER — Telehealth: Payer: Self-pay | Admitting: Family Medicine

## 2015-12-21 DIAGNOSIS — F329 Major depressive disorder, single episode, unspecified: Secondary | ICD-10-CM

## 2015-12-21 DIAGNOSIS — F32A Depression, unspecified: Secondary | ICD-10-CM

## 2015-12-21 DIAGNOSIS — F419 Anxiety disorder, unspecified: Principal | ICD-10-CM

## 2015-12-21 NOTE — Telephone Encounter (Signed)
She should not need a refill until next month she got a 90 day supply with a refill in October. She would not be due until first week in October. She needs to space out the tabs she has left or come in to discuss.

## 2015-12-21 NOTE — Telephone Encounter (Signed)
Last filled: 08/05/15 Amt: 360,1 Last OV: 08/30/15 [facility] contract on file UDS: LOW risk  Please advise.

## 2015-12-21 NOTE — Telephone Encounter (Signed)
Relation to [id number] Call back number:[phone] Pharmacy: [facility], [place] [phone] (Phone) [phone] (Fax        Reason for call:  Patient requesting a refill ALPRAZolam (XANAX) 0.5 MG tablet, patient states she has 11 pills left.

## 2015-12-22 MED ORDER — ALPRAZOLAM 0.5 MG PO TABS: ORAL_TABLET | ORAL | Status: DC

## 2015-12-22 NOTE — Telephone Encounter (Signed)
Patient informed of PCP instructions regarding taking her medication to last until next fill date 01/03/2016. Printed prescription with fill date 01/03/3016, patient informed to pickup at our office but she knows to wait until the 7th to fill/also prescription is dated.   Patient also informed PCP will discuss all information regarding this prescription at her next appt. 01/12/2016

## 2015-12-22 NOTE — Telephone Encounter (Signed)
She needs to drop to 2 a day, can take in 1/2 s then she can have her refill so that she does not run out. If it is mail order send by end of week

## 2015-12-22 NOTE — Telephone Encounter (Signed)
I spoke to the patient and she explained that in organizing her meds in her boxes for every 2 weeks, she may have by mistake thrown away the bottle that had pills in it.  She at this time has exactly 11 days left and has been taking them 3 times a day.  She is willing to reduce down to twice daily if PCP feels necessary.  She states she knows this looks suspicious, but know she did not intentionally do anything on purpose.  She is aware she has taken this medication for over 25 yrs. And would be very difficult to stop completely but will certainly be ok to reduce if possible per PCP request.

## 2015-12-23 ENCOUNTER — Ambulatory Visit (HOSPITAL_BASED_OUTPATIENT_CLINIC_OR_DEPARTMENT_OTHER)
Admission: RE | Admit: 2015-12-23 | Discharge: 2015-12-23 | Disposition: A | Discharge status: Home / Self Care | Payer: Medicare PPO | Source: Ambulatory Visit | Attending: Family Medicine | Admitting: Family Medicine

## 2015-12-23 DIAGNOSIS — Z1231 Encounter for screening mammogram for malignant neoplasm of breast: Secondary | ICD-10-CM | POA: Diagnosis not present

## 2015-12-23 DIAGNOSIS — Z1239 Encounter for other screening for malignant neoplasm of breast: Secondary | ICD-10-CM | POA: Insufficient documentation

## 2016-01-04 ENCOUNTER — Encounter: Payer: Self-pay | Admitting: Family Medicine

## 2016-01-05 MED FILL — ALPRAZolam 0.5 MG TABS: 0.5 | 30 days supply | Qty: 120 | Fill #0

## 2016-01-05 NOTE — Telephone Encounter (Signed)
Faxed hardcopy today 01/05/2016 to [place].  Patient never came by to pickup hardcopy and  Sent a mychart message to send to pharmacy.

## 2016-01-08 DIAGNOSIS — R0602 Shortness of breath: Secondary | ICD-10-CM | POA: Diagnosis not present

## 2016-01-12 ENCOUNTER — Ambulatory Visit (INDEPENDENT_AMBULATORY_CARE_PROVIDER_SITE_OTHER): Payer: Medicare PPO | Admitting: Family Medicine

## 2016-01-12 ENCOUNTER — Encounter: Payer: Self-pay | Admitting: Family Medicine

## 2016-01-12 VITALS — BP 142/80 | HR 84 | Temp 98.2°F | Ht 61.0 in | Wt 101.1 lb

## 2016-01-12 DIAGNOSIS — D649 Anemia, unspecified: Secondary | ICD-10-CM

## 2016-01-12 DIAGNOSIS — I10 Essential (primary) hypertension: Secondary | ICD-10-CM | POA: Diagnosis not present

## 2016-01-12 DIAGNOSIS — Z0000 Encounter for general adult medical examination without abnormal findings: Secondary | ICD-10-CM

## 2016-01-12 DIAGNOSIS — J449 Chronic obstructive pulmonary disease, unspecified: Secondary | ICD-10-CM

## 2016-01-12 DIAGNOSIS — E039 Hypothyroidism, unspecified: Secondary | ICD-10-CM | POA: Diagnosis not present

## 2016-01-12 DIAGNOSIS — K219 Gastro-esophageal reflux disease without esophagitis: Secondary | ICD-10-CM

## 2016-01-12 DIAGNOSIS — F419 Anxiety disorder, unspecified: Secondary | ICD-10-CM

## 2016-01-12 DIAGNOSIS — F329 Major depressive disorder, single episode, unspecified: Secondary | ICD-10-CM

## 2016-01-12 DIAGNOSIS — F418 Other specified anxiety disorders: Secondary | ICD-10-CM | POA: Diagnosis not present

## 2016-01-12 DIAGNOSIS — E876 Hypokalemia: Secondary | ICD-10-CM | POA: Diagnosis not present

## 2016-01-12 DIAGNOSIS — R739 Hyperglycemia, unspecified: Secondary | ICD-10-CM

## 2016-01-12 HISTORY — DX: Encounter for general adult medical examination without abnormal findings: Z00.00

## 2016-01-12 MED ORDER — RANITIDINE HCL 300 MG PO CAPS: 300.0000 mg | ORAL_CAPSULE | Freq: Every evening | ORAL | Status: DC

## 2016-01-12 MED ORDER — CITALOPRAM HYDROBROMIDE 20 MG PO TABS: 20.0000 mg | ORAL_TABLET | Freq: Every day | ORAL | Status: DC

## 2016-01-12 MED ORDER — ALPRAZOLAM 0.5 MG PO TABS: ORAL_TABLET | ORAL | Status: DC

## 2016-01-12 NOTE — Assessment & Plan Note (Signed)
Increase leafy greens, consider increased lean red meat and using cast iron cookware. Continue to monitor, report any concerns 

## 2016-01-12 NOTE — Assessment & Plan Note (Signed)
Avoid offending foods, start probiotics. Do not eat large meals in late evening and consider raising head of bed. Ranitidine 300 mg daily, d/c Zegerid

## 2016-01-12 NOTE — Assessment & Plan Note (Signed)
Has been accepted to the [facility] for stem cell treatments.

## 2016-01-12 NOTE — Progress Notes (Signed)
Subjective:    Patient ID: [name], female    DOB: 1941/07/09, 75 y.o.   MRN: [id number]  Chief Complaint  Patient presents with  . Follow-up    HPI Patient is in today for follow up and medicare wellness visit. She feels well today but she is acknowledges her anxiety is worsening as her breathing is worsening. Always with exertion. No fevers, chills, rhinorrhea. No acute concerns or hospitalization. Doing well with ADLs.   [facility] - [address]  Allergies verified: UTD  Immunization Status: Flu vaccine-- 05/30/15; patient reported Tdap-- 01/26/15 PNA-- patient reported that she has not had this vaccine Shingles-- patient has not received this vaccine; she voiced that the vaccine was too expensive and her insurance will not pay for it.  A/P:  Changes to [place], [place] or Personal Hx: UTD Pap-- 01/26/15 w/ Dr. Birdie Riddle; negative MMG-- 02/11/15 w/ [facility]; bi-rads category 1-negative Bone Density-- 03/07/15 w/ [facility]; AP Spine L1-L2 (T-score 2.5), Dual Femur Neck Left (T-score 1.6), Dual Femur Neck Right (T-score 1.2) & Left Forearm (T-score 1.2); normal CCS-- 08/24/10; normal; patient reported  Care Teams Updated: Dr. Renato Shin - Endocrinology  ED/Hospital/Urgent Care Visits: Per the patient, no recent visits to the ED/Hospital or Urgent Care.   Past Medical History  Diagnosis Date  . COPD (chronic obstructive pulmonary disease) ([place])   . Thyroid disease   . Glucose intolerance (impaired glucose tolerance)   . History of small bowel obstruction   . Arthralgia   . Dyspnea   . Pallor   . Chest pain   . Osteoporosis   . Dysuria   . Weight loss   . Edema   . Weakness   . Anxiety   . Depression   . Migraine   . Hypertension   . Leukocytosis   . GERD (gastroesophageal reflux disease)   . Atrophic vaginitis 12/07/2012  . Hx of adenomatous colonic polyps   . Ischemic colitis ([place])   . Nocturia 09/02/2013  . Diarrhea 09/02/2013  .  Hyperglycemia 12/01/2013  . Hypokalemia 11/28/2014  . Clostridium difficile diarrhea 12/27/2014  . Medicare annual wellness visit, subsequent 01/12/2016    Past Surgical History  Procedure Laterality Date  . Cholecystectomy    . Abdominal hysterectomy  1993  . Tubal ligation    . Bladder repair    . Dilation and curettage of uterus      40 yrs ago  . Eye surgery  04/2011    bil. eyes  . Colonoscopy with propofol N/A 06/08/2013    Procedure: COLONOSCOPY WITH PROPOFOL;  Surgeon: Lafayette Dragon, MD;  Location: [facility];  Service: Endoscopy;  Laterality: N/A;    Family History  Problem Relation Age of Onset  . Alcohol abuse Mother   . Heart disease Mother   . Stroke Mother   . Hypertension Mother   . Kidney disease Mother   . COPD Mother   . Osteoporosis Mother     hip fracture  . COPD Brother   . Alcohol abuse Brother     drug abuse  . Hearing loss Brother     mvp  . Colon cancer Paternal Aunt   . Prostate cancer    . Alcohol abuse Father     MVA  . Thyroid disease Daughter   . COPD Paternal Grandfather   . COPD Sister   . Hypertension Sister   . Anxiety disorder Sister   . COPD Sister   .  COPD Brother     current smoker  . Hearing loss Brother     cad  . COPD Brother     Social History   Social History  . Marital Status: Married    Spouse Name: N/A  . Number of Children: 1  . Years of Education: N/A   Occupational History  . Retired Hydrologist    Social History Main Topics  . Smoking status: Former Smoker -- 2.00 packs/day for 40 years    Types: Cigarettes    Quit date: 10/29/2001  . Smokeless tobacco: Never Used  . Alcohol Use: No  . Drug Use: No  . Sexual Activity: Not on file   Other Topics Concern  . Not on file   Social History Narrative    Outpatient Prescriptions Prior to Visit  Medication Sig Dispense Refill  . albuterol (PROVENTIL HFA;VENTOLIN HFA) 108 (90 BASE) MCG/ACT inhaler Inhale 2 puffs into the lungs every 6  (six) hours as needed for wheezing or shortness of breath. 3 Inhaler 3  . albuterol (PROVENTIL) (2.5 MG/3ML) 0.083% nebulizer solution Take 3 mLs (2.5 mg total) by nebulization every 4 (four) hours as needed for wheezing or shortness of breath. Dx 496 300 mL 0  . arformoterol (BROVANA) 15 MCG/2ML NEBU Take 2 mLs (15 mcg total) by nebulization 2 (two) times daily. Dx J44.9 360 mL 3  . budesonide (PULMICORT) 0.25 MG/2ML nebulizer solution Take 2 mLs (0.25 mg total) by nebulization 2 (two) times daily. Dx: J44.9 360 mL 3  . Calcium Carbonate-Vitamin D 600-400 MG-UNIT per tablet Take 1 tablet by mouth 2 (two) times daily.      . Ferrous Fumarate-Folic Acid (HEMOCYTE-F) 324-1 MG TABS Take 1 tablet by mouth daily. 30 each 5  . fluticasone (FLONASE) 50 MCG/ACT nasal spray Place 2 sprays into both nostrils 2 (two) times daily.    . Glucosamine-Chondroitin (GLUCOSAMINE CHONDR COMPLEX PO) Take 1 tablet by mouth 2 (two) times daily.     . metoprolol succinate (TOPROL-XL) 25 MG 24 hr tablet Take 1 tablet (25 mg total) by mouth daily. 90 tablet 3  . mirtazapine (REMERON) 15 MG tablet Take 1 tablet (15 mg total) by mouth at bedtime. 90 tablet 1  . ondansetron (ZOFRAN) 4 MG tablet Take 1 tablet (4 mg total) by mouth every 8 (eight) hours as needed for nausea or vomiting. 9 tablet 0  . predniSONE (DELTASONE) 10 MG tablet Take 1 tablet (10 mg total) by mouth daily. 90 tablet 3  . ALPRAZolam (XANAX) 0.5 MG tablet 1 tab po bid and 2 tabs po qhs prn anxiety, insomnia 360 tablet 0  . citalopram (CELEXA) 10 MG tablet Take 1 tablet (10 mg total) by mouth daily. 90 tablet 3  . omeprazole-sodium bicarbonate (ZEGERID) 40-1100 MG capsule Take 1 capsule by mouth 2 (two) times daily. 180 capsule 3   No facility-administered medications prior to visit.    Allergies  Allergen Reactions  . Morphine Other (See Comments)    REACTION: nightmares  . Ambien [Zolpidem] Anxiety  . Cymbalta [Duloxetine Hcl] Anxiety  .  Nitrofurantoin Rash    Review of Systems  Constitutional: Positive for malaise/fatigue. Negative for fever.  HENT: Negative for congestion.   Eyes: Negative for blurred vision.  Respiratory: Positive for shortness of breath.   Cardiovascular: Negative for chest pain, palpitations and leg swelling.  Gastrointestinal: Negative for nausea, abdominal pain and blood in stool.  Genitourinary: Negative for dysuria and frequency.  Musculoskeletal: Negative for falls.  Skin: Negative for rash.  Neurological: Negative for dizziness, loss of consciousness and headaches.  Endo/Heme/Allergies: Negative for environmental allergies.  Psychiatric/Behavioral: Negative for depression. The patient is nervous/anxious.        Objective:    Physical Exam  Constitutional: She is oriented to person, place, and time. She appears well-developed and well-nourished. No distress.  HENT:  Head: Normocephalic and atraumatic.  Nose: Nose normal.  O2 via [place]  Eyes: Right eye exhibits no discharge. Left eye exhibits no discharge.  Neck: Normal range of motion. Neck supple.  Cardiovascular: Normal rate and regular rhythm.   No murmur heard. Pulmonary/Chest: Effort normal and breath sounds normal.  Decreased BS b/l bases  Abdominal: Soft. Bowel sounds are normal. There is no tenderness.  Musculoskeletal: She exhibits no edema.  Neurological: She is alert and oriented to person, place, and time.  Skin: Skin is warm and dry.  Psychiatric: She has a normal mood and affect.  Nursing note and vitals reviewed.   BP 142/80 mmHg  Pulse 84  Temp(Src) 98.2 F (36.8 C) (Oral)  Ht 5\' 1"  (1.549 m)  Wt 101 lb 2 oz (45.87 kg)  BMI 19.12 kg/m2  SpO2 95% Wt Readings from Last 3 Encounters:  01/12/16 101 lb 2 oz (45.87 kg)  11/17/15 99 lb (44.906 kg)  10/27/15 102 lb 3.2 oz (46.358 kg)     Lab Results  Component Value Date   WBC 12.0* 09/15/2015   HGB 13.5 09/15/2015   HCT 42.0 09/15/2015   PLT 366.0  09/15/2015   GLUCOSE 111* 08/30/2015   CHOL 171 11/12/2014   TRIG 384* 11/12/2014   HDL 65 11/12/2014   LDLDIRECT 71.8 08/04/2014   LDLCALC 29 11/12/2014   ALT 16 08/30/2015   AST 23 08/30/2015   NA 144 08/30/2015   K 4.5 08/30/2015   CL 97 08/30/2015   CREATININE 0.50 08/30/2015   BUN 17 08/30/2015   CO2 42* 08/30/2015   TSH 0.79 08/30/2015   HGBA1C 5.6 08/04/2014    Lab Results  Component Value Date   TSH 0.79 08/30/2015   Lab Results  Component Value Date   WBC 12.0* 09/15/2015   HGB 13.5 09/15/2015   HCT 42.0 09/15/2015   MCV 91.6 09/15/2015   PLT 366.0 09/15/2015   Lab Results  Component Value Date   NA 144 08/30/2015   K 4.5 08/30/2015   CO2 42* 08/30/2015   GLUCOSE 111* 08/30/2015   BUN 17 08/30/2015   CREATININE 0.50 08/30/2015   BILITOT 0.2 08/30/2015   ALKPHOS 61 08/30/2015   AST 23 08/30/2015   ALT 16 08/30/2015   PROT 7.3 08/30/2015   ALBUMIN 4.2 08/30/2015   CALCIUM 9.5 08/30/2015   ANIONGAP 5 11/14/2014   GFR 128.50 08/30/2015   Lab Results  Component Value Date   CHOL 171 11/12/2014   Lab Results  Component Value Date   HDL 65 11/12/2014   Lab Results  Component Value Date   LDLCALC 29 11/12/2014   Lab Results  Component Value Date   TRIG 384* 11/12/2014   Lab Results  Component Value Date   CHOLHDL 2.6 11/12/2014   Lab Results  Component Value Date   HGBA1C 5.6 08/04/2014       Assessment & Plan:   Problem List Items Addressed This Visit    Anemia    Increase leafy greens, consider increased lean red meat and using cast iron cookware. Continue to monitor, report any concerns  COPD mixed type [facility])    Has been accepted to the [facility] for stem cell treatments.      Relevant Medications   ALPRAZolam (XANAX) 0.5 MG tablet   ranitidine (ZANTAC) 300 MG capsule   Other Relevant Orders   Comprehensive metabolic panel   CBC   TSH   Hemoglobin A1c   Lipid panel   Depression with anxiety    Increase  Citalopram 20 mg daily, decrease Alprazolam to 1/2 tab po bid prn      GERD    Avoid offending foods, start probiotics. Do not eat large meals in late evening and consider raising head of bed. Ranitidine 300 mg daily, d/c Zegerid      Relevant Medications   ALPRAZolam (XANAX) 0.5 MG tablet   ranitidine (ZANTAC) 300 MG capsule   Other Relevant Orders   Comprehensive metabolic panel   CBC   TSH   Hemoglobin A1c   Lipid panel   Hyperglycemia     minimize simple carbs. Increase exercise as tolerated. Continue current meds      Relevant Medications   ALPRAZolam (XANAX) 0.5 MG tablet   ranitidine (ZANTAC) 300 MG capsule   Other Relevant Orders   Comprehensive metabolic panel   CBC   TSH   Hemoglobin A1c   Lipid panel   HYPERTENSION, BENIGN ESSENTIAL    Well controlled, no changes to meds. Encouraged heart healthy diet such as the DASH diet and exercise as tolerated.       Relevant Medications   ALPRAZolam (XANAX) 0.5 MG tablet   ranitidine (ZANTAC) 300 MG capsule   Other Relevant Orders   Comprehensive metabolic panel   CBC   TSH   Hemoglobin A1c   Lipid panel   Hypokalemia - Primary   Relevant Medications   ALPRAZolam (XANAX) 0.5 MG tablet   ranitidine (ZANTAC) 300 MG capsule   Other Relevant Orders   Comprehensive metabolic panel   CBC   TSH   Hemoglobin A1c   Lipid panel   Hypothyroidism   Relevant Medications   ALPRAZolam (XANAX) 0.5 MG tablet   ranitidine (ZANTAC) 300 MG capsule   Other Relevant Orders   Comprehensive metabolic panel   CBC   TSH   Hemoglobin A1c   Lipid panel   Medicare annual wellness visit, subsequent    Patient denies any difficulties at home. No trouble with ADLs, depression or falls. See EMR for functional status screen and depression screen. No recent changes to vision or hearing. Is UTD with immunizations. Is UTD with screening. Discussed Advanced Directives. Encouraged heart healthy diet, exercise as tolerated and adequate  sleep. See patient's problem list for health risk factors to monitor. See AVS for preventative healthcare recommendation schedule. Agrees to bring in copies of ADs Labs ordered today       Other Visit Diagnoses    Anxiety and depression        Relevant Medications    ALPRAZolam (XANAX) 0.5 MG tablet    ranitidine (ZANTAC) 300 MG capsule    Other Relevant Orders    Comprehensive metabolic panel    CBC    TSH    Hemoglobin A1c    Lipid panel       I have discontinued Ms. Keeven's omeprazole-sodium bicarbonate. I have also changed her ALPRAZolam and citalopram. Additionally, I am having her start on ranitidine. Lastly, I am having her maintain her Calcium Carbonate-Vitamin D, Glucosamine-Chondroitin (GLUCOSAMINE CHONDR COMPLEX PO), albuterol, fluticasone, ondansetron, albuterol, budesonide, Ferrous Fumarate-Folic  Acid, metoprolol succinate, mirtazapine, arformoterol, and predniSONE.  Meds ordered this encounter  Medications  . ALPRAZolam (XANAX) 0.5 MG tablet    Sig: Take 1/2 tablet twice daily as needed    Dispense:  180 tablet    Refill:  2  . ranitidine (ZANTAC) 300 MG capsule    Sig: Take 1 capsule (300 mg total) by mouth every evening.    Dispense:  90 capsule    Refill:  1  . citalopram (CELEXA) 20 MG tablet    Sig: Take 1 tablet (20 mg total) by mouth daily.    Dispense:  30 tablet    Refill:  2     [name] Homans, MD

## 2016-01-12 NOTE — Assessment & Plan Note (Signed)
Increase Citalopram 20 mg daily, decrease Alprazolam to 1/2 tab po bid prn

## 2016-01-12 NOTE — Assessment & Plan Note (Signed)
minimize simple carbs. Increase exercise as tolerated. Continue current meds  

## 2016-01-12 NOTE — Patient Instructions (Signed)
Preventive Care for Adults, Female A healthy lifestyle and preventive care can promote health and wellness. Preventive health guidelines for women include the following key practices.  A routine yearly physical is a good way to check with your health care provider about your health and preventive screening. It is a chance to share any concerns and updates on your health and to receive a thorough exam.  Visit your dentist for a routine exam and preventive care every 6 months. Brush your teeth twice a day and floss once a day. Good oral hygiene prevents tooth decay and gum disease.  The frequency of eye exams is based on your age, health, family medical history, use of contact lenses, and other factors. Follow your health care provider's recommendations for frequency of eye exams.  Eat a healthy diet. Foods like vegetables, fruits, whole grains, low-fat dairy products, and lean protein foods contain the nutrients you need without too many calories. Decrease your intake of foods high in solid fats, added sugars, and salt. Eat the right amount of calories for you.Get information about a proper diet from your health care provider, if necessary.  Regular physical exercise is one of the most important things you can do for your health. Most adults should get at least 150 minutes of moderate-intensity exercise (any activity that increases your heart rate and causes you to sweat) each week. In addition, most adults need muscle-strengthening exercises on 2 or more days a week.  Maintain a healthy weight. The body mass index (BMI) is a screening tool to identify possible weight problems. It provides an estimate of body fat based on height and weight. Your health care provider can find your BMI and can help you achieve or maintain a healthy weight.For adults 20 years and older:  A BMI below 18.5 is considered underweight.  A BMI of 18.5 to 24.9 is normal.  A BMI of 25 to 29.9 is considered overweight.  A  BMI of 30 and above is considered obese.  Maintain normal blood lipids and cholesterol levels by exercising and minimizing your intake of saturated fat. Eat a balanced diet with plenty of fruit and vegetables. Blood tests for lipids and cholesterol should begin at age 45 and be repeated every 5 years. If your lipid or cholesterol levels are high, you are over 50, or you are at high risk for heart disease, you may need your cholesterol levels checked more frequently.Ongoing high lipid and cholesterol levels should be treated with medicines if diet and exercise are not working.  If you smoke, find out from your health care provider how to quit. If you do not use tobacco, do not start.  Lung cancer screening is recommended for adults aged 45-80 years who are at high risk for developing lung cancer because of a history of smoking. A yearly low-dose CT scan of the lungs is recommended for people who have at least a 30-pack-year history of smoking and are a current smoker or have quit within the past 15 years. A pack year of smoking is smoking an average of 1 pack of cigarettes a day for 1 year (for example: 1 pack a day for 30 years or 2 packs a day for 15 years). Yearly screening should continue until the smoker has stopped smoking for at least 15 years. Yearly screening should be stopped for people who develop a health problem that would prevent them from having lung cancer treatment.  If you are pregnant, do not drink alcohol. If you are  breastfeeding, be very cautious about drinking alcohol. If you are not pregnant and choose to drink alcohol, do not have more than 1 drink per day. One drink is considered to be 12 ounces (355 mL) of beer, 5 ounces (148 mL) of wine, or 1.5 ounces (44 mL) of liquor.  Avoid use of street drugs. Do not share needles with anyone. Ask for help if you need support or instructions about stopping the use of drugs.  High blood pressure causes heart disease and increases the risk  of stroke. Your blood pressure should be checked at least every 1 to 2 years. Ongoing high blood pressure should be treated with medicines if weight loss and exercise do not work.  If you are 55-79 years old, ask your health care provider if you should take aspirin to prevent strokes.  Diabetes screening is done by taking a blood sample to check your blood glucose level after you have not eaten for a certain period of time (fasting). If you are not overweight and you do not have risk factors for diabetes, you should be screened once every 3 years starting at age 45. If you are overweight or obese and you are 40-70 years of age, you should be screened for diabetes every year as part of your cardiovascular risk assessment.  Breast cancer screening is essential preventive care for women. You should practice "breast self-awareness." This means understanding the normal appearance and feel of your breasts and may include breast self-examination. Any changes detected, no matter how small, should be reported to a health care provider. Women in their 20s and 30s should have a clinical breast exam (CBE) by a health care provider as part of a regular health exam every 1 to 3 years. After age 40, women should have a CBE every year. Starting at age 40, women should consider having a mammogram (breast X-ray test) every year. Women who have a family history of breast cancer should talk to their health care provider about genetic screening. Women at a high risk of breast cancer should talk to their health care providers about having an MRI and a mammogram every year.  Breast cancer gene (BRCA)-related cancer risk assessment is recommended for women who have family members with BRCA-related cancers. BRCA-related cancers include breast, ovarian, tubal, and peritoneal cancers. Having family members with these cancers may be associated with an increased risk for harmful changes (mutations) in the breast cancer genes BRCA1 and  BRCA2. Results of the assessment will determine the need for genetic counseling and BRCA1 and BRCA2 testing.  Your health care provider may recommend that you be screened regularly for cancer of the pelvic organs (ovaries, uterus, and vagina). This screening involves a pelvic examination, including checking for microscopic changes to the surface of your cervix (Pap test). You may be encouraged to have this screening done every 3 years, beginning at age 21.  For women ages 30-65, health care providers may recommend pelvic exams and Pap testing every 3 years, or they may recommend the Pap and pelvic exam, combined with testing for human papilloma virus (HPV), every 5 years. Some types of HPV increase your risk of cervical cancer. Testing for HPV may also be done on women of any age with unclear Pap test results.  Other health care providers may not recommend any screening for nonpregnant women who are considered low risk for pelvic cancer and who do not have symptoms. Ask your health care provider if a screening pelvic exam is right for   you.  If you have had past treatment for cervical cancer or a condition that could lead to cancer, you need Pap tests and screening for cancer for at least 20 years after your treatment. If Pap tests have been discontinued, your risk factors (such as having a new sexual partner) need to be reassessed to determine if screening should resume. Some women have medical problems that increase the chance of getting cervical cancer. In these cases, your health care provider may recommend more frequent screening and Pap tests.  Colorectal cancer can be detected and often prevented. Most routine colorectal cancer screening begins at the age of 50 years and continues through age 75 years. However, your health care provider may recommend screening at an earlier age if you have risk factors for colon cancer. On a yearly basis, your health care provider may provide home test kits to check  for hidden blood in the stool. Use of a small camera at the end of a tube, to directly examine the colon (sigmoidoscopy or colonoscopy), can detect the earliest forms of colorectal cancer. Talk to your health care provider about this at age 50, when routine screening begins. Direct exam of the colon should be repeated every 5-10 years through age 75 years, unless early forms of precancerous polyps or small growths are found.  People who are at an increased risk for hepatitis B should be screened for this virus. You are considered at high risk for hepatitis B if:  You were born in a country where hepatitis B occurs often. Talk with your health care provider about which countries are considered high risk.  Your parents were born in a high-risk country and you have not received a shot to protect against hepatitis B (hepatitis B vaccine).  You have HIV or AIDS.  You use needles to inject street drugs.  You live with, or have sex with, someone who has hepatitis B.  You get hemodialysis treatment.  You take certain medicines for conditions like cancer, organ transplantation, and autoimmune conditions.  Hepatitis C blood testing is recommended for all people born from 1945 through 1965 and any individual with known risks for hepatitis C.  Practice safe sex. Use condoms and avoid high-risk sexual practices to reduce the spread of sexually transmitted infections (STIs). STIs include gonorrhea, chlamydia, syphilis, trichomonas, herpes, HPV, and human immunodeficiency virus (HIV). Herpes, HIV, and HPV are viral illnesses that have no cure. They can result in disability, cancer, and death.  You should be screened for sexually transmitted illnesses (STIs) including gonorrhea and chlamydia if:  You are sexually active and are younger than 24 years.  You are older than 24 years and your health care provider tells you that you are at risk for this type of infection.  Your sexual activity has changed  since you were last screened and you are at an increased risk for chlamydia or gonorrhea. Ask your health care provider if you are at risk.  If you are at risk of being infected with HIV, it is recommended that you take a prescription medicine daily to prevent HIV infection. This is called preexposure prophylaxis (PrEP). You are considered at risk if:  You are sexually active and do not regularly use condoms or know the HIV status of your partner(s).  You take drugs by injection.  You are sexually active with a partner who has HIV.  Talk with your health care provider about whether you are at high risk of being infected with HIV. If   you choose to begin PrEP, you should first be tested for HIV. You should then be tested every 3 months for as long as you are taking PrEP.  Osteoporosis is a disease in which the bones lose minerals and strength with aging. This can result in serious bone fractures or breaks. The risk of osteoporosis can be identified using a bone density scan. Women ages 67 years and over and women at risk for fractures or osteoporosis should discuss screening with their health care providers. Ask your health care provider whether you should take a calcium supplement or vitamin D to reduce the rate of osteoporosis.  Menopause can be associated with physical symptoms and risks. Hormone replacement therapy is available to decrease symptoms and risks. You should talk to your health care provider about whether hormone replacement therapy is right for you.  Use sunscreen. Apply sunscreen liberally and repeatedly throughout the day. You should seek shade when your shadow is shorter than you. Protect yourself by wearing long sleeves, pants, a wide-brimmed hat, and sunglasses year round, whenever you are outdoors.  Once a month, do a whole body skin exam, using a mirror to look at the skin on your back. Tell your health care provider of new moles, moles that have irregular borders, moles that  are larger than a pencil eraser, or moles that have changed in shape or color.  Stay current with required vaccines (immunizations).  Influenza vaccine. All adults should be immunized every year.  Tetanus, diphtheria, and acellular pertussis (Td, Tdap) vaccine. Pregnant women should receive 1 dose of Tdap vaccine during each pregnancy. The dose should be obtained regardless of the length of time since the last dose. Immunization is preferred during the 27th-36th week of gestation. An adult who has not previously received Tdap or who does not know her vaccine status should receive 1 dose of Tdap. This initial dose should be followed by tetanus and diphtheria toxoids (Td) booster doses every 10 years. Adults with an unknown or incomplete history of completing a 3-dose immunization series with Td-containing vaccines should begin or complete a primary immunization series including a Tdap dose. Adults should receive a Td booster every 10 years.  Varicella vaccine. An adult without evidence of immunity to varicella should receive 2 doses or a second dose if she has previously received 1 dose. Pregnant females who do not have evidence of immunity should receive the first dose after pregnancy. This first dose should be obtained before leaving the health care facility. The second dose should be obtained 4-8 weeks after the first dose.  Human papillomavirus (HPV) vaccine. Females aged 13-26 years who have not received the vaccine previously should obtain the 3-dose series. The vaccine is not recommended for use in pregnant females. However, pregnancy testing is not needed before receiving a dose. If a female is found to be pregnant after receiving a dose, no treatment is needed. In that case, the remaining doses should be delayed until after the pregnancy. Immunization is recommended for any person with an immunocompromised condition through the age of 61 years if she did not get any or all doses earlier. During the  3-dose series, the second dose should be obtained 4-8 weeks after the first dose. The third dose should be obtained 24 weeks after the first dose and 16 weeks after the second dose.  Zoster vaccine. One dose is recommended for adults aged 30 years or older unless certain conditions are present.  Measles, mumps, and rubella (MMR) vaccine. Adults born  before 1957 generally are considered immune to measles and mumps. Adults born in 1957 or later should have 1 or more doses of MMR vaccine unless there is a contraindication to the vaccine or there is laboratory evidence of immunity to each of the three diseases. A routine second dose of MMR vaccine should be obtained at least 28 days after the first dose for students attending postsecondary schools, health care workers, or international travelers. People who received inactivated measles vaccine or an unknown type of measles vaccine during 1963-1967 should receive 2 doses of MMR vaccine. People who received inactivated mumps vaccine or an unknown type of mumps vaccine before 1979 and are at high risk for mumps infection should consider immunization with 2 doses of MMR vaccine. For females of childbearing age, rubella immunity should be determined. If there is no evidence of immunity, females who are not pregnant should be vaccinated. If there is no evidence of immunity, females who are pregnant should delay immunization until after pregnancy. Unvaccinated health care workers born before 1957 who lack laboratory evidence of measles, mumps, or rubella immunity or laboratory confirmation of disease should consider measles and mumps immunization with 2 doses of MMR vaccine or rubella immunization with 1 dose of MMR vaccine.  Pneumococcal 13-valent conjugate (PCV13) vaccine. When indicated, a person who is uncertain of his immunization history and has no record of immunization should receive the PCV13 vaccine. All adults 65 years of age and older should receive this  vaccine. An adult aged 19 years or older who has certain medical conditions and has not been previously immunized should receive 1 dose of PCV13 vaccine. This PCV13 should be followed with a dose of pneumococcal polysaccharide (PPSV23) vaccine. Adults who are at high risk for pneumococcal disease should obtain the PPSV23 vaccine at least 8 weeks after the dose of PCV13 vaccine. Adults older than 74 years of age who have normal immune system function should obtain the PPSV23 vaccine dose at least 1 year after the dose of PCV13 vaccine.  Pneumococcal polysaccharide (PPSV23) vaccine. When PCV13 is also indicated, PCV13 should be obtained first. All adults aged 65 years and older should be immunized. An adult younger than age 65 years who has certain medical conditions should be immunized. Any person who resides in a nursing home or long-term care facility should be immunized. An adult smoker should be immunized. People with an immunocompromised condition and certain other conditions should receive both PCV13 and PPSV23 vaccines. People with human immunodeficiency virus (HIV) infection should be immunized as soon as possible after diagnosis. Immunization during chemotherapy or radiation therapy should be avoided. Routine use of PPSV23 vaccine is not recommended for American Indians, [place] Natives, or people younger than 65 years unless there are medical conditions that require PPSV23 vaccine. When indicated, people who have unknown immunization and have no record of immunization should receive PPSV23 vaccine. One-time revaccination 5 years after the first dose of PPSV23 is recommended for people aged 19-64 years who have chronic kidney failure, nephrotic syndrome, asplenia, or immunocompromised conditions. People who received 1-2 doses of PPSV23 before age 65 years should receive another dose of PPSV23 vaccine at age 65 years or later if at least 5 years have passed since the previous dose. Doses of PPSV23 are not  needed for people immunized with PPSV23 at or after age 65 years.  Meningococcal vaccine. Adults with asplenia or persistent complement component deficiencies should receive 2 doses of quadrivalent meningococcal conjugate (MenACWY-D) vaccine. The doses should be obtained   at least 2 months apart. Microbiologists working with certain meningococcal bacteria, [place] recruits, people at risk during an outbreak, and people who travel to or live in countries with a high rate of meningitis should be immunized. A first-year college student up through age 34 years who is living in a residence hall should receive a dose if she did not receive a dose on or after her 16th birthday. Adults who have certain high-risk conditions should receive one or more doses of vaccine.  Hepatitis A vaccine. Adults who wish to be protected from this disease, have certain high-risk conditions, work with hepatitis A-infected animals, work in hepatitis A research labs, or travel to or work in countries with a high rate of hepatitis A should be immunized. Adults who were previously unvaccinated and who anticipate close contact with an international adoptee during the first 60 days after arrival in the [place] from a country with a high rate of hepatitis A should be immunized.  Hepatitis B vaccine. Adults who wish to be protected from this disease, have certain high-risk conditions, may be exposed to blood or other infectious body fluids, are household contacts or sex partners of hepatitis B positive people, are clients or workers in certain care facilities, or travel to or work in countries with a high rate of hepatitis B should be immunized.  Haemophilus influenzae type b (Hib) vaccine. A previously unvaccinated person with asplenia or sickle cell disease or having a scheduled splenectomy should receive 1 dose of Hib vaccine. Regardless of previous immunization, a recipient of a hematopoietic stem cell transplant should receive a  3-dose series 6-12 months after her successful transplant. Hib vaccine is not recommended for adults with HIV infection. Preventive Services / Frequency Ages 35 to 4 years  Blood pressure check.** / Every 3-5 years.  Lipid and cholesterol check.** / Every 5 years beginning at age 60.  Clinical breast exam.** / Every 3 years for women in their 71s and 10s.  BRCA-related cancer risk assessment.** / For women who have family members with a BRCA-related cancer (breast, ovarian, tubal, or peritoneal cancers).  Pap test.** / Every 2 years from ages 76 through 26. Every 3 years starting at age 61 through age 76 or 93 with a history of 3 consecutive normal Pap tests.  HPV screening.** / Every 3 years from ages 37 through ages 60 to 51 with a history of 3 consecutive normal Pap tests.  Hepatitis C blood test.** / For any individual with known risks for hepatitis C.  Skin self-exam. / Monthly.  Influenza vaccine. / Every year.  Tetanus, diphtheria, and acellular pertussis (Tdap, Td) vaccine.** / Consult your health care provider. Pregnant women should receive 1 dose of Tdap vaccine during each pregnancy. 1 dose of Td every 10 years.  Varicella vaccine.** / Consult your health care provider. Pregnant females who do not have evidence of immunity should receive the first dose after pregnancy.  HPV vaccine. / 3 doses over 6 months, if 93 and younger. The vaccine is not recommended for use in pregnant females. However, pregnancy testing is not needed before receiving a dose.  Measles, mumps, rubella (MMR) vaccine.** / You need at least 1 dose of MMR if you were born in 1957 or later. You may also need a 2nd dose. For females of childbearing age, rubella immunity should be determined. If there is no evidence of immunity, females who are not pregnant should be vaccinated. If there is no evidence of immunity, females who are  pregnant should delay immunization until after pregnancy.  Pneumococcal  13-valent conjugate (PCV13) vaccine.** / Consult your health care provider.  Pneumococcal polysaccharide (PPSV23) vaccine.** / 1 to 2 doses if you smoke cigarettes or if you have certain conditions.  Meningococcal vaccine.** / 1 dose if you are age 68 to 8 years and a Market researcher living in a residence hall, or have one of several medical conditions, you need to get vaccinated against meningococcal disease. You may also need additional booster doses.  Hepatitis A vaccine.** / Consult your health care provider.  Hepatitis B vaccine.** / Consult your health care provider.  Haemophilus influenzae type b (Hib) vaccine.** / Consult your health care provider. Ages 7 to 53 years  Blood pressure check.** / Every year.  Lipid and cholesterol check.** / Every 5 years beginning at age 25 years.  Lung cancer screening. / Every year if you are aged 11-80 years and have a 30-pack-year history of smoking and currently smoke or have quit within the past 15 years. Yearly screening is stopped once you have quit smoking for at least 15 years or develop a health problem that would prevent you from having lung cancer treatment.  Clinical breast exam.** / Every year after age 48 years.  BRCA-related cancer risk assessment.** / For women who have family members with a BRCA-related cancer (breast, ovarian, tubal, or peritoneal cancers).  Mammogram.** / Every year beginning at age 41 years and continuing for as long as you are in good health. Consult with your health care provider.  Pap test.** / Every 3 years starting at age 65 years through age 37 or 70 years with a history of 3 consecutive normal Pap tests.  HPV screening.** / Every 3 years from ages 72 years through ages 60 to 40 years with a history of 3 consecutive normal Pap tests.  Fecal occult blood test (FOBT) of stool. / Every year beginning at age 21 years and continuing until age 5 years. You may not need to do this test if you get  a colonoscopy every 10 years.  Flexible sigmoidoscopy or colonoscopy.** / Every 5 years for a flexible sigmoidoscopy or every 10 years for a colonoscopy beginning at age 35 years and continuing until age 48 years.  Hepatitis C blood test.** / For all people born from [birth year] through 1965 and any individual with known risks for hepatitis C.  Skin self-exam. / Monthly.  Influenza vaccine. / Every year.  Tetanus, diphtheria, and acellular pertussis (Tdap/Td) vaccine.** / Consult your health care provider. Pregnant women should receive 1 dose of Tdap vaccine during each pregnancy. 1 dose of Td every 10 years.  Varicella vaccine.** / Consult your health care provider. Pregnant females who do not have evidence of immunity should receive the first dose after pregnancy.  Zoster vaccine.** / 1 dose for adults aged 30 years or older.  Measles, mumps, rubella (MMR) vaccine.** / You need at least 1 dose of MMR if you were born in 1957 or later. You may also need a second dose. For females of childbearing age, rubella immunity should be determined. If there is no evidence of immunity, females who are not pregnant should be vaccinated. If there is no evidence of immunity, females who are pregnant should delay immunization until after pregnancy.  Pneumococcal 13-valent conjugate (PCV13) vaccine.** / Consult your health care provider.  Pneumococcal polysaccharide (PPSV23) vaccine.** / 1 to 2 doses if you smoke cigarettes or if you have certain conditions.  Meningococcal vaccine.** /  Consult your health care provider.  Hepatitis A vaccine.** / Consult your health care provider.  Hepatitis B vaccine.** / Consult your health care provider.  Haemophilus influenzae type b (Hib) vaccine.** / Consult your health care provider. Ages 64 years and over  Blood pressure check.** / Every year.  Lipid and cholesterol check.** / Every 5 years beginning at age 23 years.  Lung cancer screening. / Every year if you  are aged 16-80 years and have a 30-pack-year history of smoking and currently smoke or have quit within the past 15 years. Yearly screening is stopped once you have quit smoking for at least 15 years or develop a health problem that would prevent you from having lung cancer treatment.  Clinical breast exam.** / Every year after age 74 years.  BRCA-related cancer risk assessment.** / For women who have family members with a BRCA-related cancer (breast, ovarian, tubal, or peritoneal cancers).  Mammogram.** / Every year beginning at age 44 years and continuing for as long as you are in good health. Consult with your health care provider.  Pap test.** / Every 3 years starting at age 58 years through age 22 or 39 years with 3 consecutive normal Pap tests. Testing can be stopped between 65 and 70 years with 3 consecutive normal Pap tests and no abnormal Pap or HPV tests in the past 10 years.  HPV screening.** / Every 3 years from ages 64 years through ages 70 or 61 years with a history of 3 consecutive normal Pap tests. Testing can be stopped between 65 and 70 years with 3 consecutive normal Pap tests and no abnormal Pap or HPV tests in the past 10 years.  Fecal occult blood test (FOBT) of stool. / Every year beginning at age 40 years and continuing until age 27 years. You may not need to do this test if you get a colonoscopy every 10 years.  Flexible sigmoidoscopy or colonoscopy.** / Every 5 years for a flexible sigmoidoscopy or every 10 years for a colonoscopy beginning at age 7 years and continuing until age 32 years.  Hepatitis C blood test.** / For all people born from [birth year] through 1964 and any individual with known risks for hepatitis C.  Osteoporosis screening.** / A one-time screening for women ages 30 years and over and women at risk for fractures or osteoporosis.  Skin self-exam. / Monthly.  Influenza vaccine. / Every year.  Tetanus, diphtheria, and acellular pertussis (Tdap/Td)  vaccine.** / 1 dose of Td every 10 years.  Varicella vaccine.** / Consult your health care provider.  Zoster vaccine.** / 1 dose for adults aged 35 years or older.  Pneumococcal 13-valent conjugate (PCV13) vaccine.** / Consult your health care provider.  Pneumococcal polysaccharide (PPSV23) vaccine.** / 1 dose for all adults aged 46 years and older.  Meningococcal vaccine.** / Consult your health care provider.  Hepatitis A vaccine.** / Consult your health care provider.  Hepatitis B vaccine.** / Consult your health care provider.  Haemophilus influenzae type b (Hib) vaccine.** / Consult your health care provider. ** Family history and personal history of risk and conditions may change your health care provider's recommendations.   This information is not intended to replace advice given to you by your health care provider. Make sure you discuss any questions you have with your health care provider.   Document Released: 12/11/2001 Document Revised: 11/05/2014 Document Reviewed: 03/12/2011 Elsevier Interactive Patient Education [employer].

## 2016-01-12 NOTE — Assessment & Plan Note (Signed)
Patient denies any difficulties at home. No trouble with ADLs, depression or falls. See EMR for functional status screen and depression screen. No recent changes to vision or hearing. Is UTD with immunizations. Is UTD with screening. Discussed Advanced Directives. Encouraged heart healthy diet, exercise as tolerated and adequate sleep. See patient's problem list for health risk factors to monitor. See AVS for preventative healthcare recommendation schedule. Agrees to bring in copies of ADs Labs ordered today

## 2016-01-12 NOTE — Progress Notes (Signed)
Pre visit review using our clinic review tool, if applicable. No additional management support is needed unless otherwise documented below in the visit note. 

## 2016-01-12 NOTE — Assessment & Plan Note (Signed)
Well controlled, no changes to meds. Encouraged heart healthy diet such as the DASH diet and exercise as tolerated.  °

## 2016-01-13 ENCOUNTER — Other Ambulatory Visit: Payer: Self-pay | Admitting: Family Medicine

## 2016-01-13 DIAGNOSIS — E876 Hypokalemia: Secondary | ICD-10-CM

## 2016-01-13 LAB — COMPREHENSIVE METABOLIC PANEL
ALBUMIN: 4.1 g/dL (ref 3.5–5.2)
ALT: 25 U/L (ref 0–35)
AST: 28 U/L (ref 0–37)
Alkaline Phosphatase: 49 U/L (ref 39–117)
BUN: 22 mg/dL (ref 6–23)
CHLORIDE: 93 meq/L — AB (ref 96–112)
CO2: 45 meq/L — AB (ref 19–32)
Calcium: 8.9 mg/dL (ref 8.4–10.5)
Creatinine, Ser: 0.46 mg/dL (ref 0.40–1.20)
GFR: 141.33 mL/min (ref >60.00)
Glucose, Bld: 89 mg/dL (ref 70–99)
POTASSIUM: 3.4 meq/L — AB (ref 3.5–5.1)
SODIUM: 145 meq/L (ref 135–145)
Total Bilirubin: 0.4 mg/dL (ref 0.2–1.2)
Total Protein: 6.4 g/dL (ref 6.0–8.3)

## 2016-01-13 LAB — LIPID PANEL
CHOL/HDL RATIO: 2
Cholesterol: 184 mg/dL (ref 0–200)
HDL: 76.1 mg/dL (ref >39.00)
LDL CALC: 76 mg/dL (ref 0–99)
NONHDL: 107.85
TRIGLYCERIDES: 161 mg/dL — AB (ref 0.0–149.0)
VLDL: 32.2 mg/dL (ref 0.0–40.0)

## 2016-01-13 LAB — CBC
HEMATOCRIT: 38.2 % (ref 36.0–46.0)
HEMOGLOBIN: 12.1 g/dL (ref 12.0–15.0)
MCHC: 31.8 g/dL (ref 30.0–36.0)
MCV: 93 fl (ref 78.0–100.0)
Platelets: 333 10*3/uL (ref 150.0–400.0)
RBC: 4.11 Mil/uL (ref 3.87–5.11)
RDW: 15.2 % (ref 11.5–15.5)
WBC: 11.4 10*3/uL — AB (ref 4.0–10.5)

## 2016-01-13 LAB — HEMOGLOBIN A1C: Hgb A1c MFr Bld: 5.9 % (ref 4.6–6.5)

## 2016-01-13 LAB — TSH: TSH: 1.44 u[IU]/mL (ref 0.35–4.50)

## 2016-01-30 ENCOUNTER — Other Ambulatory Visit (INDEPENDENT_AMBULATORY_CARE_PROVIDER_SITE_OTHER): Payer: Medicare PPO

## 2016-01-30 DIAGNOSIS — E876 Hypokalemia: Secondary | ICD-10-CM | POA: Diagnosis not present

## 2016-01-30 LAB — COMPREHENSIVE METABOLIC PANEL
ALK PHOS: 48 U/L (ref 39–117)
ALT: 17 U/L (ref 0–35)
AST: 21 U/L (ref 0–37)
Albumin: 4.1 g/dL (ref 3.5–5.2)
BILIRUBIN TOTAL: 0.5 mg/dL (ref 0.2–1.2)
BUN: 22 mg/dL (ref 6–23)
CALCIUM: 9.5 mg/dL (ref 8.4–10.5)
CO2: 47 meq/L — AB (ref 19–32)
Chloride: 93 mEq/L — ABNORMAL LOW (ref 96–112)
Creatinine, Ser: 0.47 mg/dL (ref 0.40–1.20)
GFR: 137.85 mL/min (ref >60.00)
GLUCOSE: 104 mg/dL — AB (ref 70–99)
POTASSIUM: 3.1 meq/L — AB (ref 3.5–5.1)
Sodium: 145 mEq/L (ref 135–145)
Total Protein: 6.7 g/dL (ref 6.0–8.3)

## 2016-01-31 ENCOUNTER — Other Ambulatory Visit: Payer: Self-pay | Admitting: Family Medicine

## 2016-01-31 DIAGNOSIS — E876 Hypokalemia: Secondary | ICD-10-CM

## 2016-02-03 ENCOUNTER — Telehealth: Payer: Self-pay | Admitting: Pulmonary Disease

## 2016-02-03 DIAGNOSIS — J9610 Chronic respiratory failure, unspecified whether with hypoxia or hypercapnia: Secondary | ICD-10-CM

## 2016-02-03 NOTE — Telephone Encounter (Signed)
LMTCB

## 2016-02-06 NOTE — Telephone Encounter (Signed)
Okay to refer? 

## 2016-02-06 NOTE — Telephone Encounter (Signed)
Pt is requesting a referral to Pulmonary Rehab.  Please advise Dr Elsworth Soho. Thanks.

## 2016-02-06 NOTE — Telephone Encounter (Signed)
Order has been placed. Pt's husband is aware. Nothing further was needed.

## 2016-02-08 DIAGNOSIS — H01111 Allergic dermatitis of right upper eyelid: Secondary | ICD-10-CM | POA: Diagnosis not present

## 2016-02-08 DIAGNOSIS — R0602 Shortness of breath: Secondary | ICD-10-CM | POA: Diagnosis not present

## 2016-02-14 ENCOUNTER — Other Ambulatory Visit (INDEPENDENT_AMBULATORY_CARE_PROVIDER_SITE_OTHER): Payer: Medicare PPO

## 2016-02-14 DIAGNOSIS — E876 Hypokalemia: Secondary | ICD-10-CM | POA: Diagnosis not present

## 2016-02-15 LAB — COMPREHENSIVE METABOLIC PANEL
ALBUMIN: 4.3 g/dL (ref 3.5–5.2)
ALT: 16 U/L (ref 0–35)
AST: 24 U/L (ref 0–37)
Alkaline Phosphatase: 51 U/L (ref 39–117)
BILIRUBIN TOTAL: 0.4 mg/dL (ref 0.2–1.2)
BUN: 20 mg/dL (ref 6–23)
CALCIUM: 10.1 mg/dL (ref 8.4–10.5)
CHLORIDE: 95 meq/L — AB (ref 96–112)
CO2: 46 mEq/L — ABNORMAL HIGH (ref 19–32)
CREATININE: 0.59 mg/dL (ref 0.40–1.20)
GFR: 106.02 mL/min (ref >60.00)
Glucose, Bld: 95 mg/dL (ref 70–99)
Potassium: 5.1 mEq/L (ref 3.5–5.1)
SODIUM: 145 meq/L (ref 135–145)
TOTAL PROTEIN: 6.8 g/dL (ref 6.0–8.3)

## 2016-02-16 ENCOUNTER — Ambulatory Visit (INDEPENDENT_AMBULATORY_CARE_PROVIDER_SITE_OTHER): Payer: Medicare PPO | Admitting: Pulmonary Disease

## 2016-02-16 ENCOUNTER — Encounter: Payer: Self-pay | Admitting: Pulmonary Disease

## 2016-02-16 VITALS — BP 114/71 | HR 76 | Ht 61.0 in | Wt 96.0 lb

## 2016-02-16 DIAGNOSIS — J449 Chronic obstructive pulmonary disease, unspecified: Secondary | ICD-10-CM | POA: Diagnosis not present

## 2016-02-16 DIAGNOSIS — J9611 Chronic respiratory failure with hypoxia: Secondary | ICD-10-CM

## 2016-02-16 NOTE — Assessment & Plan Note (Signed)
Trial of Stiolto instead of brovana -she will call us for prescription of this works She will stay on albuterol as needed-she has been able to a minimize use She will also stay on Pulmicort twice daily

## 2016-02-16 NOTE — Assessment & Plan Note (Signed)
Stay on 2-3 L oxygen

## 2016-02-16 NOTE — Patient Instructions (Signed)
Trial of Stiolto - instead of brovana Stay on 5 mg prednisone

## 2016-02-16 NOTE — Progress Notes (Signed)
   Subjective:    Patient ID: [name], female    DOB: 08/29/1941, 75 y.o.   MRN: [id number]  HPI  40 yowf , heavy ex- smoker, quit 2002 with GOLD D COPD FEV1 26% 03/2009 on 24 h O2 since 2009.  -completed pulm rehab oct '11  Recurrent flares 2-3/yr requiring steroid bursts  She uses 3 liters 24/7. On a regimen of budesonide/brovana & alb/atrovent nebs    02/16/2016  Chief Complaint  Patient presents with  . Follow-up    doing well.  [facility]    She had a flare in 09/2015 requiring prednisone burst and antibiotics She went to [place], [place] in 12/2015 and [facility]-paid a lot of money for this. Her husband also got the same therapy. She has not noticed any change in her symptoms. She has an occasional good day and then her breathing worsens again. She is compliant with brovana twice a day -we had switched her to nebs since she had difficulty taking MDI.  She denies pedal edema or nocturnal wheezing Significant tests/ events  Admitted 12/2012 for COPD exacerbation .ABG 7.39/55/75 on 3L [place]  She required low dose prednisone since her admit in 02/2013   08/2013 BL pneumonia  03/2014 ONO on 2L >> no sig desatn  CT chest 10/2014 neg PE  12/06/2014 >>2.5 L at rest , 3L on exertion - ONO ok in the past  2016 c diff   Review of Systems Patient denies significant dyspnea,cough, hemoptysis,  chest pain, palpitations, pedal edema, orthopnea, paroxysmal nocturnal dyspnea, lightheadedness, nausea, vomiting, abdominal or  leg pains      Objective:   Physical Exam  Gen. Pleasant, thin, frail, in no distress ENT - no lesions, no post nasal drip Neck: No JVD, no thyromegaly, no carotid bruits Lungs: no use of accessory muscles, no dullness to percussion, decreased without rales, scattered rhonchi  Cardiovascular: Rhythm regular, heart sounds  normal, no murmurs or gallops, no peripheral  edema Musculoskeletal: No deformities, no cyanosis or clubbing        Assessment & Plan:

## 2016-02-17 ENCOUNTER — Telehealth: Payer: Self-pay | Admitting: Pulmonary Disease

## 2016-02-17 NOTE — Telephone Encounter (Signed)
Pt wanted to know how often she should use Stiolto inhaler sample. Pt is aware that she should do 2 puffs QD. Nothing more needed at this time.

## 2016-02-21 ENCOUNTER — Encounter: Payer: Self-pay | Admitting: Pulmonary Disease

## 2016-02-21 NOTE — Telephone Encounter (Signed)
Per 4.20.17 ov w/ RA: Patient Instructions       Trial of Stiolto - instead of brovana Stay on 5 mg prednisone   LMOM TCB x1 for pt to find out the local pharmacy she would like the [facility] sent to (she supplied 3 names) and to make sure that she needs an rx sent to her mail order as well.  Have also sent pt an email.

## 2016-02-22 MED ORDER — TIOTROPIUM BROMIDE-OLODATEROL 2.5-2.5 MCG/ACT IN AERS: 2.0000 | INHALATION_SPRAY | Freq: Every day | RESPIRATORY_TRACT | Status: DC

## 2016-02-22 NOTE — Telephone Encounter (Signed)
Rx sent to [facility] 90-day supply with 3 refills 30-day supply sent to [facility] [address] Replied to e-mail patient know. Nothing further needed.

## 2016-02-24 ENCOUNTER — Encounter (HOSPITAL_COMMUNITY)
Admission: RE | Admit: 2016-02-24 | Discharge: 2016-02-24 | Disposition: A | Discharge status: Home / Self Care | Payer: Medicare PPO | Source: Ambulatory Visit | Attending: Pulmonary Disease | Admitting: Pulmonary Disease

## 2016-02-24 ENCOUNTER — Encounter (HOSPITAL_COMMUNITY): Payer: Self-pay

## 2016-02-24 VITALS — BP 140/40 | HR 69 | Resp 18 | Ht 61.0 in | Wt 96.3 lb

## 2016-02-24 DIAGNOSIS — J9610 Chronic respiratory failure, unspecified whether with hypoxia or hypercapnia: Secondary | ICD-10-CM | POA: Insufficient documentation

## 2016-02-24 NOTE — Progress Notes (Signed)
[name] 75 y.o. female Pulmonary Rehab Orientation Note [phone] Patient arrived today in [facility] for orientation to Pulmonary Rehab. She was transported by her husband from [employer] via wheel chair. She does carry portable liquid oxygen. Per pt, she uses oxygen continuously. Color good, skin warm and dry. Patient is oriented to time and place. Patient's medical history, psychosocial health, and medications reviewed. Psychosocial assessment reveals pt lives with their spouse. Pt is currently retired. Pt reports her stress level is low. Areas of stress/anxiety include Family health issues. Most of her immidiate family members also have COPD. Her biological mother died of COPD. Her husband also has COPD and both are currently receiving venous stem cell transplant therapy in [place], [place]. Pt does not exhibit signs of depression. PHQ2/9 score 0/na. Pt shows good  coping skills with positive outlook on life. She is extremely optimistic about the stem cell transplant. She offered emotional support and reassurance. Will continue to monitor and evaluate progress toward psychosocial goal(s) of remaining positive about her future even if she does not receive good results from the transplant. Physical assessment reveals heart rate is normal. Breath sounds are significantly diminished throughout, however she does have faint expiratory wheezes in the right middle and upper lobes. She feels this is an improvement since the stem cell transplant. She stated she was not moving any air prior to then. Distal pulses palpable. No edema noted. Patient reports she does take medications as prescribed. Patient states she follows a Regular diet. The patient reports no specific efforts to gain or lose weight. She has lost several pounds since her husband has been on a healthy diet. She states she eats healthy meals with him and denies herself "snacks" out of guilt. She understands that she is  underweight and cannot afford to continue to loose weight. Patient's weight will be monitored closely. Demonstration and practice of PLB using pulse oximeter. Patient able to return demonstration satisfactorily. Safety and hand hygiene in the exercise area reviewed with patient. Patient voices understanding of the information reviewed. Department expectations discussed with patient and achievable goals were set. The patient shows enthusiasm about attending the program and we look forward to working with this nice lady. The patient is scheduled for a 6 min walk test on Tuesday 5/9 and to begin exercise on Tuesday 5/16 in the 1:30 class.   45 minutes was spent on a variety of activities such as assessment of the patient, obtaining baseline data including height, weight, BMI, and grip strength, verifying medical history, allergies, and current medications, and teaching patient strategies for performing tasks with less respiratory effort with emphasis on pursed lip breathing.

## 2016-02-28 ENCOUNTER — Encounter (HOSPITAL_COMMUNITY): Payer: Self-pay | Admitting: *Deleted

## 2016-03-01 ENCOUNTER — Encounter: Payer: Self-pay | Admitting: Pulmonary Disease

## 2016-03-06 ENCOUNTER — Ambulatory Visit (HOSPITAL_COMMUNITY): Payer: Medicare PPO

## 2016-03-09 DIAGNOSIS — R0602 Shortness of breath: Secondary | ICD-10-CM | POA: Diagnosis not present

## 2016-03-13 ENCOUNTER — Encounter (HOSPITAL_COMMUNITY)
Admission: RE | Admit: 2016-03-13 | Discharge: 2016-03-13 | Disposition: A | Discharge status: Home / Self Care | Payer: Medicare PPO | Source: Ambulatory Visit | Attending: Pulmonary Disease | Admitting: Pulmonary Disease

## 2016-03-13 DIAGNOSIS — J9610 Chronic respiratory failure, unspecified whether with hypoxia or hypercapnia: Secondary | ICD-10-CM

## 2016-03-13 NOTE — Progress Notes (Signed)
Pulmonary Individual Treatment Plan  Patient Details  Name: [name] MRN: [id number] Date of Birth: 03-12-1941 Referring Provider:        PULMONARY REHAB OTHER RESPIRATORY from 03/13/2016 in [place]   Referring Provider  Dr. Elsworth Soho      Initial Encounter Date:       PULMONARY REHAB OTHER RESPIRATORY from 03/13/2016 in [place]   Date  03/13/16   Referring Provider  Dr. Elsworth Soho      Visit Diagnosis: Chronic respiratory failure, unspecified whether with hypoxia or hypercapnia ([place])  Patient's Home Medications on Admission:   Current outpatient prescriptions:  .  albuterol (PROVENTIL HFA;VENTOLIN HFA) 108 (90 BASE) MCG/ACT inhaler, Inhale 2 puffs into the lungs every 6 (six) hours as needed for wheezing or shortness of breath., Disp: 3 Inhaler, Rfl: 3 .  albuterol (PROVENTIL) (2.5 MG/3ML) 0.083% nebulizer solution, Take 3 mLs (2.5 mg total) by nebulization every 4 (four) hours as needed for wheezing or shortness of breath. Dx 496, Disp: 300 mL, Rfl: 0 .  ALPRAZolam (XANAX) 0.5 MG tablet, Take 1/2 tablet twice daily as needed, Disp: 180 tablet, Rfl: 2 .  budesonide (PULMICORT) 0.25 MG/2ML nebulizer solution, Take 2 mLs (0.25 mg total) by nebulization 2 (two) times daily. Dx: J44.9, Disp: 360 mL, Rfl: 3 .  Calcium Carbonate-Vitamin D 600-400 MG-UNIT per tablet, Take 1 tablet by mouth 2 (two) times daily.  , Disp: , Rfl:  .  citalopram (CELEXA) 20 MG tablet, Take 1 tablet (20 mg total) by mouth daily., Disp: 30 tablet, Rfl: 2 .  Ferrous Fumarate-Folic Acid (HEMOCYTE-F) 324-1 MG TABS, Take 1 tablet by mouth daily., Disp: 30 each, Rfl: 5 .  fluticasone (FLONASE) 50 MCG/ACT nasal spray, Place 2 sprays into both nostrils 2 (two) times daily., Disp: , Rfl:  .  Glucosamine-Chondroitin (GLUCOSAMINE CHONDR COMPLEX PO), Take 1 tablet by mouth 2 (two) times daily. , Disp: , Rfl:  .  metoprolol succinate (TOPROL-XL) 25 MG 24 hr  tablet, Take 1 tablet (25 mg total) by mouth daily., Disp: 90 tablet, Rfl: 3 .  mirtazapine (REMERON) 15 MG tablet, Take 1 tablet (15 mg total) by mouth at bedtime., Disp: 90 tablet, Rfl: 1 .  ondansetron (ZOFRAN) 4 MG tablet, Take 1 tablet (4 mg total) by mouth every 8 (eight) hours as needed for nausea or vomiting., Disp: 9 tablet, Rfl: 0 .  predniSONE (DELTASONE) 10 MG tablet, Take 1 tablet (10 mg total) by mouth daily. (Patient taking differently: Take 5 mg by mouth daily. ), Disp: 90 tablet, Rfl: 3 .  ranitidine (ZANTAC) 300 MG capsule, Take 1 capsule (300 mg total) by mouth every evening., Disp: 90 capsule, Rfl: 1 .  Tiotropium Bromide-Olodaterol (STIOLTO RESPIMAT) 2.5-2.5 MCG/ACT AERS, Inhale 2 puffs into the lungs daily., Disp: 3 Inhaler, Rfl: 3 .  Tiotropium Bromide-Olodaterol (STIOLTO RESPIMAT) 2.5-2.5 MCG/ACT AERS, Inhale 2 puffs into the lungs daily., Disp: 1 Inhaler, Rfl: 0  Past Medical History: Past Medical History  Diagnosis Date  . COPD (chronic obstructive pulmonary disease) ([place])   . Thyroid disease   . Glucose intolerance (impaired glucose tolerance)   . History of small bowel obstruction   . Arthralgia   . Dyspnea   . Pallor   . Chest pain   . Osteoporosis   . Dysuria   . Weight loss   . Edema   . Weakness   . Anxiety   . Depression   . Migraine   .  Hypertension   . Leukocytosis   . GERD (gastroesophageal reflux disease)   . Atrophic vaginitis 12/07/2012  . Hx of adenomatous colonic polyps   . Ischemic colitis ([place])   . Nocturia 09/02/2013  . Diarrhea 09/02/2013  . Hyperglycemia 12/01/2013  . Hypokalemia 11/28/2014  . Clostridium difficile diarrhea 12/27/2014  . Medicare annual wellness visit, subsequent 01/12/2016    Tobacco Use: History  Smoking status  . Former Smoker -- 2.00 packs/day for 40 years  . Types: Cigarettes  . Quit date: 10/29/2001  Smokeless tobacco  . Never Used    Labs: Recent Review Flowsheet Data    Labs for ITP Cardiac and  Pulmonary Rehab Latest Ref Rng 08/04/2014 11/12/2014 11/13/2014 11/14/2014 01/12/2016   Cholestrol 0 - 200 mg/dL 192 171 - - 184   LDLCALC 0 - 99 mg/dL - 29 - - 76   LDLDIRECT - 71.8 - - - -   HDL >39.00 mg/dL 75.10 65 - - 76.10   Trlycerides 0.0 - 149.0 mg/dL 219.0(H) 384(H) - - 161.0(H)   Hemoglobin A1c 4.6 - 6.5 % 5.6 - - - 5.9   PHART 7.350 - 7.450 - - 7.338(L) 7.346(L) -   PCO2ART 35.0 - 45.0 mmHg - - 70.0(HH) 56.6(H) -   HCO3 20.0 - 24.0 mEq/L - - 37.1(H) 30.1(H) -   TCO2 0 - 100 mmol/L - - 39 31.9 -   O2SAT - - - 95.0 96.2 -      Capillary Blood Glucose: No results found for: GLUCAP   ADL UCSD:     Pulmonary Assessment Scores      02/28/16 1445       ADL UCSD   ADL Phase Entry     SOB Score total 87        Pulmonary Function Assessment:     Pulmonary Function Assessment - 02/24/16 1458    Breath   Bilateral Breath Sounds Decreased;Expiratory;Wheezes  grosly deminished throughout but faint expiratory wheezes herd in  right upper right middle lobe   Shortness of Breath Fear of Shortness of Breath;Limiting activity;Panic with Shortness of Breath;Yes      Exercise Target Goals: Date: 03/13/16  Exercise Program Goal: Individual exercise prescription set with THRR, safety & activity barriers. Participant demonstrates ability to understand and report RPE using BORG scale, to self-measure pulse accurately, and to acknowledge the importance of the exercise prescription.  Exercise Prescription Goal: Starting with aerobic activity 30 plus minutes a day, 3 days per week for initial exercise prescription. Provide home exercise prescription and guidelines that participant acknowledges understanding prior to discharge.  Activity Barriers & Risk Stratification:     Activity Barriers & Cardiac Risk Stratification - 02/24/16 1456    Activity Barriers & Cardiac Risk Stratification   Activity Barriers Shortness of Breath;Deconditioning;Back Problems  lower back pain treated  with aspercream      6 Minute Walk:     6 Minute Walk      03/13/16 1615       6 Minute Walk   Phase Initial     Distance 1050 feet     Walk Time 6 minutes     # of Rest Breaks 0     MPH 1.98     METS 2.75     RPE 13     Perceived Dyspnea  2     VO2 Peak 9.65     Symptoms No     Resting HR 72 bpm     Resting BP 98/60  mmHg     Max Ex. HR 97 bpm     Max Ex. BP 153/86 mmHg     2 Minute Post BP 112/66 mmHg     Interval HR   Baseline HR 72     1 Minute HR 76     2 Minute HR 75     3 Minute HR 81     4 Minute HR 81     5 Minute HR 97     6 Minute HR 91     2 Minute Post HR 75     Interval Heart Rate? Yes     Interval Oxygen   Interval Oxygen? Yes     Baseline Oxygen Saturation % 99 %     Baseline Liters of Oxygen 3 L     1 Minute Oxygen Saturation % 93 %     1 Minute Liters of Oxygen 3 L     2 Minute Oxygen Saturation % 96 %     2 Minute Liters of Oxygen 3 L     3 Minute Oxygen Saturation % 90 %     3 Minute Liters of Oxygen 3 L     4 Minute Oxygen Saturation % 85 %     4 Minute Liters of Oxygen 3 L     5 Minute Oxygen Saturation % 84 %     5 Minute Liters of Oxygen 4 L     6 Minute Oxygen Saturation % 91 %     6 Minute Liters of Oxygen 4 L     2 Minute Post Oxygen Saturation % 99 %     2 Minute Post Liters of Oxygen 4 L        Initial Exercise Prescription:     Initial Exercise Prescription - 03/13/16 1600    Date of Initial Exercise RX and Referring Provider   Date 03/13/16   Referring Provider Dr. Elsworth Soho   Oxygen   Oxygen Continuous   Liters 3-4   NuStep   Level 2   Minutes 15   METs 1.5   Arm Ergometer   Level 1   Watts 20   Minutes 15   Track   Laps 8   Minutes 15   Prescription Details   Frequency (times per week) 2   Duration Progress to 45 minutes of aerobic exercise without signs/symptoms of physical distress   Intensity   THRR 40-80% of Max Heartrate 59-118   Ratings of Perceived Exertion 11-13   Perceived Dyspnea 0-4    Progression   Progression Continue to progress workloads to maintain intensity without signs/symptoms of physical distress.   Resistance Training   Training Prescription Yes   Weight ORANGE   Reps 10-12      Perform Capillary Blood Glucose checks as needed.  Exercise Prescription Changes:   Exercise Comments:   Discharge Exercise Prescription (Final Exercise Prescription Changes):    Nutrition:  Target Goals: Understanding of nutrition guidelines, daily intake of sodium 1500mg , cholesterol 200mg , calories 30% from fat and 7% or less from saturated fats, daily to have 5 or more servings of fruits and vegetables.  Biometrics:     Pre Biometrics - 02/24/16 1523    Pre Biometrics   Grip Strength 22 kg       Nutrition Therapy Plan and Nutrition Goals:   Nutrition Discharge: Rate Your Plate Scores:   Psychosocial: Target Goals: Acknowledge presence or absence of depression, maximize coping skills, provide positive support system.  Participant is able to verbalize types and ability to use techniques and skills needed for reducing stress and depression.  Initial Review & Psychosocial Screening:     Initial Psych Review & Screening - 02/24/16 [place]? Yes   Barriers   Psychosocial barriers to participate in program There are no identifiable barriers or psychosocial needs.   Screening Interventions   Interventions Encouraged to exercise      Quality of Life Scores:     Quality of Life - 02/28/16 1445    Quality of Life Scores   Health/Function Pre 13.97 %   Socioeconomic Pre 23.92 %   Psych/Spiritual Pre 8.75 %   Family Pre 15 %   GLOBAL Post 15.02 %      PHQ-9:     Recent Review Flowsheet Data    Depression screen [facility] 2/9 02/24/2016 11/12/2014   Decreased Interest 0 0   Down, Depressed, Hopeless 0 0   PHQ - 2 Score 0 0      Psychosocial Evaluation and Intervention:   Psychosocial  Re-Evaluation:  Education: Education Goals: Education classes will be provided on a weekly basis, covering required topics. Participant will state understanding/return demonstration of topics presented.  Learning Barriers/Preferences:     Learning Barriers/Preferences - 02/24/16 1457    Learning Barriers/Preferences   Learning Barriers None   Learning Preferences Group Instruction;Individual Instruction;Written Material      Education Topics: Risk Factor Reduction:  -Group instruction that is supported by a PowerPoint presentation. Instructor discusses the definition of a risk factor, different risk factors for pulmonary disease, and how the heart and lungs work together.     Nutrition for Pulmonary Patient:  -Group instruction provided by PowerPoint slides, verbal discussion, and written materials to support subject matter. The instructor gives an explanation and review of healthy diet recommendations, which includes a discussion on weight management, recommendations for fruit and vegetable consumption, as well as protein, fluid, caffeine, fiber, sodium, sugar, and alcohol. Tips for eating when patients are short of breath are discussed.   Pursed Lip Breathing:  -Group instruction that is supported by demonstration and informational handouts. Instructor discusses the benefits of pursed lip and diaphragmatic breathing and detailed demonstration on how to preform both.     Oxygen Safety:  -Group instruction provided by PowerPoint, verbal discussion, and written material to support subject matter. There is an overview of "What is Oxygen" and "Why do we need it".  Instructor also reviews how to create a safe environment for oxygen use, the importance of using oxygen as prescribed, and the risks of noncompliance. There is a brief discussion on traveling with oxygen and resources the patient may utilize.   Oxygen Equipment:  -Group instruction provided by [facility] Staff utilizing  handouts, written materials, and equipment demonstrations.   Signs and Symptoms:  -Group instruction provided by written material and verbal discussion to support subject matter. Warning signs and symptoms of infection, stroke, and heart attack are reviewed and when to call the physician/911 reinforced. Tips for preventing the spread of infection discussed.   Advanced Directives:  -Group instruction provided by verbal instruction and written material to support subject matter. Instructor reviews Advanced Directive laws and proper instruction for filling out document.   Pulmonary Video:  -Group video education that reviews the importance of medication and oxygen compliance, exercise, good nutrition, pulmonary hygiene, and pursed lip and diaphragmatic breathing for the pulmonary patient.   Exercise for the Pulmonary  Patient:  -Group instruction that is supported by a PowerPoint presentation. Instructor discusses benefits of exercise, core components of exercise, frequency, duration, and intensity of an exercise routine, importance of utilizing pulse oximetry during exercise, safety while exercising, and options of places to exercise outside of rehab.     Pulmonary Medications:  -Verbally interactive group education provided by instructor with focus on inhaled medications and proper administration.   Anatomy and Physiology of the Respiratory System and Intimacy:  -Group instruction provided by PowerPoint, verbal discussion, and written material to support subject matter. Instructor reviews respiratory cycle and anatomical components of the respiratory system and their functions. Instructor also reviews differences in obstructive and restrictive respiratory diseases with examples of each. Intimacy, Sex, and Sexuality differences are reviewed with a discussion on how relationships can change when diagnosed with pulmonary disease. Common sexual concerns are reviewed.   Knowledge Questionnaire  Score:     Knowledge Questionnaire Score - 02/28/16 1444    Knowledge Questionnaire Score   Pre Score 11/13      Core Components/Risk Factors/Patient Goals at Admission:     Personal Goals and Risk Factors at Admission - 02/24/16 1502    Core Components/Risk Factors/Patient Goals on Admission    Weight Management Yes;Weight Gain   Intervention Weight Management: Develop a combined nutrition and exercise program designed to reach desired caloric intake, while maintaining appropriate intake of nutrient and fiber, sodium and fats, and appropriate energy expenditure required for the weight goal.;Weight Management: Provide education and appropriate resources to help participant work on and attain dietary goals.   Admit Weight 96 lb 5.5 oz (43.7 kg)   Expected Outcomes Short Term: Continue to assess and modify interventions until short term weight is achieved;Long Term: Adherence to nutrition and physical activity/exercise program aimed toward attainment of established weight goal;Weight Gain: Understanding of general recommendations for a high calorie, high protein meal plan that promotes weight gain by distributing calorie intake throughout the day with the consumption for 4-5 meals, snacks, and/or supplements;Understanding of distribution of calorie intake throughout the day with the consumption of 4-5 meals/snacks;Understanding recommendations for meals to include 15-35% energy as protein, 25-35% energy from fat, 35-60% energy from carbohydrates, less than 200mg  of dietary cholesterol, 20-35 gm of total fiber daily   Increase Strength and Stamina Yes   Intervention Provide advice, education, support and counseling about physical activity/exercise needs.;Develop an individualized exercise prescription for aerobic and resistive training based on initial evaluation findings, risk stratification, comorbidities and participant's personal goals.   Expected Outcomes Achievement of increased  cardiorespiratory fitness and enhanced flexibility, muscular endurance and strength shown through measurements of functional capacity and personal statement of participant.   Improve shortness of breath with ADL's Yes   Intervention Provide education, individualized exercise plan and daily activity instruction to help decrease symptoms of SOB with activities of daily living.   Expected Outcomes Short Term: Achieves a reduction of symptoms when performing activities of daily living.   Develop more efficient breathing techniques such as purse lipped breathing and diaphragmatic breathing; and practicing self-pacing with activity Yes   Intervention Provide education, demonstration and support about specific breathing techniuqes utilized for more efficient breathing. Include techniques such as pursed lipped breathing, diaphragmatic breathing and self-pacing activity.   Expected Outcomes Short Term: Participant will be able to demonstrate and use breathing techniques as needed throughout daily activities.   Increase knowledge of respiratory medications and ability to use respiratory devices properly  Yes   Intervention Provide education and demonstration  as needed of appropriate use of medications, inhalers, and oxygen therapy.   Expected Outcomes Short Term: Achieves understanding of medications use. Understands that oxygen is a medication prescribed by physician. Demonstrates appropriate use of inhaler and oxygen therapy.   Personal Goal Other Yes   Personal Goal be able to walk dog on a daily basis in neighborhood, be able to enjoy more socialization with family and friends   Intervention encourage development of a home exercise plan and increase workloads on equiptment as tolerated inorder to increase stamina and stength   Expected Outcomes patient will verbalize that she is able to walk her dog and enjoy social outings with less shortness of breath      Core Components/Risk Factors/Patient Goals  Review:    Core Components/Risk Factors/Patient Goals at Discharge (Final Review):    ITP Comments:   Comments:

## 2016-03-14 DIAGNOSIS — H01111 Allergic dermatitis of right upper eyelid: Secondary | ICD-10-CM | POA: Diagnosis not present

## 2016-03-15 ENCOUNTER — Encounter (HOSPITAL_COMMUNITY): Admission: RE | Admit: 2016-03-15 | Payer: Medicare PPO | Source: Ambulatory Visit

## 2016-03-20 ENCOUNTER — Encounter (HOSPITAL_COMMUNITY): Payer: Medicare PPO

## 2016-03-20 ENCOUNTER — Telehealth (HOSPITAL_COMMUNITY): Payer: Self-pay | Admitting: Family Medicine

## 2016-03-22 ENCOUNTER — Encounter (HOSPITAL_COMMUNITY): Payer: Medicare PPO

## 2016-03-27 ENCOUNTER — Encounter (HOSPITAL_COMMUNITY): Payer: Medicare PPO

## 2016-03-27 ENCOUNTER — Encounter (HOSPITAL_COMMUNITY): Payer: Self-pay | Admitting: *Deleted

## 2016-03-27 NOTE — Progress Notes (Signed)
Discharge Summary  Patient Details  Name: [name] MRN: [id number] Date of Birth: 04-Apr-1941 Referring Provider:        PULMONARY REHAB OTHER RESPIRATORY from 03/13/2016 in [place]   Referring Provider  Dr. Elsworth Soho       Number of Visits: 0  Reason for Discharge:  Early Exit:  due to no transportation to the program, her husband's health does not allow him to drive her at this time.  Smoking History:  History  Smoking status  . Former Smoker -- 2.00 packs/day for 40 years  . Types: Cigarettes  . Quit date: 10/29/2001  Smokeless tobacco  . Never Used    Diagnosis:  No diagnosis found.  ADL UCSD:     Pulmonary Assessment Scores      02/28/16 1445       ADL UCSD   ADL Phase Entry     SOB Score total 87        Initial Exercise Prescription:     Initial Exercise Prescription - 03/13/16 1600    Date of Initial Exercise RX and Referring Provider   Date 03/13/16   Referring Provider Dr. Elsworth Soho   Oxygen   Oxygen Continuous   Liters 3-4   NuStep   Level 2   Minutes 15   METs 1.5   Arm Ergometer   Level 1   Watts 20   Minutes 15   Track   Laps 8   Minutes 15   Prescription Details   Frequency (times per week) 2   Duration Progress to 45 minutes of aerobic exercise without signs/symptoms of physical distress   Intensity   THRR 40-80% of Max Heartrate 59-118   Ratings of Perceived Exertion 11-13   Perceived Dyspnea 0-4   Progression   Progression Continue to progress workloads to maintain intensity without signs/symptoms of physical distress.   Resistance Training   Training Prescription Yes   Weight ORANGE   Reps 10-12      Discharge Exercise Prescription (Final Exercise Prescription Changes):   Functional Capacity:     6 Minute Walk      03/13/16 1615       6 Minute Walk   Phase Initial     Distance 1050 feet     Walk Time 6 minutes     # of Rest Breaks 0     MPH 1.98     METS 2.75     RPE 13      Perceived Dyspnea  2     VO2 Peak 9.65     Symptoms No     Resting HR 72 bpm     Resting BP 98/60 mmHg     Max Ex. HR 97 bpm     Max Ex. BP 153/86 mmHg     2 Minute Post BP 112/66 mmHg     Interval HR   Baseline HR 72     1 Minute HR 76     2 Minute HR 75     3 Minute HR 81     4 Minute HR 81     5 Minute HR 97     6 Minute HR 91     2 Minute Post HR 75     Interval Heart Rate? Yes     Interval Oxygen   Interval Oxygen? Yes     Baseline Oxygen Saturation % 99 %     Baseline Liters of Oxygen 3 L  1 Minute Oxygen Saturation % 93 %     1 Minute Liters of Oxygen 3 L     2 Minute Oxygen Saturation % 96 %     2 Minute Liters of Oxygen 3 L     3 Minute Oxygen Saturation % 90 %     3 Minute Liters of Oxygen 3 L     4 Minute Oxygen Saturation % 85 %     4 Minute Liters of Oxygen 3 L     5 Minute Oxygen Saturation % 84 %     5 Minute Liters of Oxygen 4 L     6 Minute Oxygen Saturation % 91 %     6 Minute Liters of Oxygen 4 L     2 Minute Post Oxygen Saturation % 99 %     2 Minute Post Liters of Oxygen 4 L        Psychological, QOL, Others - Outcomes: PHQ 2/9: Depression screen [facility] 2/9 02/24/2016 11/12/2014  Decreased Interest 0 0  Down, Depressed, Hopeless 0 0  PHQ - 2 Score 0 0    Quality of Life:     Quality of Life - 02/28/16 1445    Quality of Life Scores   Health/Function Pre 13.97 %   Socioeconomic Pre 23.92 %   Psych/Spiritual Pre 8.75 %   Family Pre 15 %   GLOBAL Post 15.02 %      Personal Goals: Goals established at orientation with interventions provided to work toward goal.     Personal Goals and Risk Factors at Admission - 02/24/16 1502    Core Components/Risk Factors/Patient Goals on Admission    Weight Management Yes;Weight Gain   Intervention Weight Management: Develop a combined nutrition and exercise program designed to reach desired caloric intake, while maintaining appropriate intake of nutrient and fiber, sodium and fats, and  appropriate energy expenditure required for the weight goal.;Weight Management: Provide education and appropriate resources to help participant work on and attain dietary goals.   Admit Weight 96 lb 5.5 oz (43.7 kg)   Expected Outcomes Short Term: Continue to assess and modify interventions until short term weight is achieved;Long Term: Adherence to nutrition and physical activity/exercise program aimed toward attainment of established weight goal;Weight Gain: Understanding of general recommendations for a high calorie, high protein meal plan that promotes weight gain by distributing calorie intake throughout the day with the consumption for 4-5 meals, snacks, and/or supplements;Understanding of distribution of calorie intake throughout the day with the consumption of 4-5 meals/snacks;Understanding recommendations for meals to include 15-35% energy as protein, 25-35% energy from fat, 35-60% energy from carbohydrates, less than 280m of dietary cholesterol, 20-35 gm of total fiber daily   Increase Strength and Stamina Yes   Intervention Provide advice, education, support and counseling about physical activity/exercise needs.;Develop an individualized exercise prescription for aerobic and resistive training based on initial evaluation findings, risk stratification, comorbidities and participant's personal goals.   Expected Outcomes Achievement of increased cardiorespiratory fitness and enhanced flexibility, muscular endurance and strength shown through measurements of functional capacity and personal statement of participant.   Improve shortness of breath with ADL's Yes   Intervention Provide education, individualized exercise plan and daily activity instruction to help decrease symptoms of SOB with activities of daily living.   Expected Outcomes Short Term: Achieves a reduction of symptoms when performing activities of daily living.   Develop more efficient breathing techniques such as purse lipped breathing  and diaphragmatic breathing; and practicing self-pacing  with activity Yes   Intervention Provide education, demonstration and support about specific breathing techniuqes utilized for more efficient breathing. Include techniques such as pursed lipped breathing, diaphragmatic breathing and self-pacing activity.   Expected Outcomes Short Term: Participant will be able to demonstrate and use breathing techniques as needed throughout daily activities.   Increase knowledge of respiratory medications and ability to use respiratory devices properly  Yes   Intervention Provide education and demonstration as needed of appropriate use of medications, inhalers, and oxygen therapy.   Expected Outcomes Short Term: Achieves understanding of medications use. Understands that oxygen is a medication prescribed by physician. Demonstrates appropriate use of inhaler and oxygen therapy.   Personal Goal Other Yes   Personal Goal be able to walk dog on a daily basis in neighborhood, be able to enjoy more socialization with family and friends   Intervention encourage development of a home exercise plan and increase workloads on equiptment as tolerated inorder to increase stamina and stength   Expected Outcomes patient will verbalize that she is able to walk her dog and enjoy social outings with less shortness of breath       Personal Goals Discharge:No goals were met due to no attendance in our exercise program.   Nutrition & Weight - Outcomes:     Pre Biometrics - 02/24/16 1523    Pre Biometrics   Grip Strength 22 kg       Nutrition:   Nutrition Discharge:   Education Questionnaire Score:     Knowledge Questionnaire Score - 02/28/16 1444    Knowledge Questionnaire Score   Pre Score 11/13      Goals reviewed with patient; copy given to patient.

## 2016-03-27 NOTE — Progress Notes (Signed)
Pulmonary Rehab Discharge Note  [place] has been discharged from pulmonary rehab before even starting the program.  She attended orientation and completed her 6 minute walk test, but did not attend any of the exercise sessions.  She has no transportation to the program due to her husband having a back issue that is requiring an MRI and has been placed on bedrest.  He is her sole source of transportation.  Hopefully she will be able to attend in the future when her husband's health improves.

## 2016-03-28 ENCOUNTER — Telehealth: Payer: Self-pay | Admitting: Pulmonary Disease

## 2016-03-28 NOTE — Telephone Encounter (Signed)
LM for Sharonda x 1  Need to verify fax # also

## 2016-03-29 ENCOUNTER — Encounter (HOSPITAL_COMMUNITY): Payer: Medicare PPO

## 2016-03-29 NOTE — Telephone Encounter (Signed)
Spoke with [place]. Advised her that we have not drawn any recent labs on the pt. She states that she will contact the pt and the pt's PCP office. Nothing further was needed.

## 2016-03-29 NOTE — Telephone Encounter (Signed)
Bethel Born called back. Her fax number is [phone]. She can be reached via phone [phone]. -prm

## 2016-04-03 ENCOUNTER — Encounter (HOSPITAL_COMMUNITY): Payer: Medicare PPO

## 2016-04-05 ENCOUNTER — Encounter (HOSPITAL_COMMUNITY): Payer: Medicare PPO

## 2016-04-09 DIAGNOSIS — R0602 Shortness of breath: Secondary | ICD-10-CM | POA: Diagnosis not present

## 2016-04-09 MED FILL — ALPRAZolam 0.5 MG TABS: 0.5 | 30 days supply | Qty: 120 | Fill #1

## 2016-04-10 ENCOUNTER — Encounter (HOSPITAL_COMMUNITY): Payer: Medicare PPO

## 2016-04-12 ENCOUNTER — Encounter (HOSPITAL_COMMUNITY): Payer: Medicare PPO

## 2016-04-17 ENCOUNTER — Encounter (HOSPITAL_COMMUNITY): Payer: Medicare PPO

## 2016-04-19 ENCOUNTER — Encounter (HOSPITAL_COMMUNITY): Payer: Medicare PPO

## 2016-04-24 ENCOUNTER — Encounter (HOSPITAL_COMMUNITY): Payer: Medicare PPO

## 2016-04-24 ENCOUNTER — Encounter: Payer: Self-pay | Admitting: Family Medicine

## 2016-04-24 ENCOUNTER — Ambulatory Visit (INDEPENDENT_AMBULATORY_CARE_PROVIDER_SITE_OTHER): Payer: Medicare PPO | Admitting: Family Medicine

## 2016-04-24 ENCOUNTER — Ambulatory Visit (HOSPITAL_BASED_OUTPATIENT_CLINIC_OR_DEPARTMENT_OTHER)
Admission: RE | Admit: 2016-04-24 | Discharge: 2016-04-24 | Disposition: A | Discharge status: Home / Self Care | Payer: Medicare PPO | Source: Ambulatory Visit | Attending: Family Medicine | Admitting: Family Medicine

## 2016-04-24 VITALS — BP 122/78 | HR 78 | Temp 98.2°F | Ht 61.0 in | Wt 97.4 lb

## 2016-04-24 DIAGNOSIS — R739 Hyperglycemia, unspecified: Secondary | ICD-10-CM

## 2016-04-24 DIAGNOSIS — F419 Anxiety disorder, unspecified: Secondary | ICD-10-CM

## 2016-04-24 DIAGNOSIS — R350 Frequency of micturition: Secondary | ICD-10-CM | POA: Diagnosis not present

## 2016-04-24 DIAGNOSIS — E039 Hypothyroidism, unspecified: Secondary | ICD-10-CM

## 2016-04-24 DIAGNOSIS — M545 Low back pain: Secondary | ICD-10-CM | POA: Diagnosis not present

## 2016-04-24 DIAGNOSIS — R300 Dysuria: Secondary | ICD-10-CM

## 2016-04-24 DIAGNOSIS — M810 Age-related osteoporosis without current pathological fracture: Secondary | ICD-10-CM

## 2016-04-24 DIAGNOSIS — I10 Essential (primary) hypertension: Secondary | ICD-10-CM

## 2016-04-24 DIAGNOSIS — F32A Depression, unspecified: Secondary | ICD-10-CM

## 2016-04-24 DIAGNOSIS — F418 Other specified anxiety disorders: Secondary | ICD-10-CM

## 2016-04-24 DIAGNOSIS — G4700 Insomnia, unspecified: Secondary | ICD-10-CM

## 2016-04-24 DIAGNOSIS — K219 Gastro-esophageal reflux disease without esophagitis: Secondary | ICD-10-CM

## 2016-04-24 DIAGNOSIS — E876 Hypokalemia: Secondary | ICD-10-CM

## 2016-04-24 DIAGNOSIS — I700 Atherosclerosis of aorta: Secondary | ICD-10-CM | POA: Insufficient documentation

## 2016-04-24 DIAGNOSIS — M47816 Spondylosis without myelopathy or radiculopathy, lumbar region: Secondary | ICD-10-CM | POA: Diagnosis not present

## 2016-04-24 DIAGNOSIS — I708 Atherosclerosis of other arteries: Secondary | ICD-10-CM | POA: Diagnosis not present

## 2016-04-24 DIAGNOSIS — M8588 Other specified disorders of bone density and structure, other site: Secondary | ICD-10-CM | POA: Insufficient documentation

## 2016-04-24 DIAGNOSIS — F329 Major depressive disorder, single episode, unspecified: Secondary | ICD-10-CM

## 2016-04-24 DIAGNOSIS — J449 Chronic obstructive pulmonary disease, unspecified: Secondary | ICD-10-CM

## 2016-04-24 MED ORDER — ALPRAZOLAM 0.5 MG PO TABS: 0.5000 mg | ORAL_TABLET | Freq: Two times a day (BID) | ORAL | Status: DC | PRN

## 2016-04-24 NOTE — Assessment & Plan Note (Signed)
On Levothyroxine, continue to monitor 

## 2016-04-24 NOTE — Assessment & Plan Note (Signed)
Well controlled, no changes to meds. Encouraged heart healthy diet such as the DASH diet and exercise as tolerated.  °

## 2016-04-24 NOTE — Assessment & Plan Note (Addendum)
Doing much better since going to [place] for treatment but does O2 around the clock

## 2016-04-24 NOTE — Assessment & Plan Note (Signed)
With abdominal and back pain,urinary frequency, urgency. No hematuria but describes suprapubic warmth or fever.

## 2016-04-24 NOTE — Assessment & Plan Note (Signed)
Encouraged good sleep hygiene such as dark, quiet room. No blue/green glowing lights such as computer screens in bedroom. No alcohol or stimulants in evening. Cut down on caffeine as able. Regular exercise is helpful but not just prior to bed time. Not sleeping well on 1/2 of an Alprazolam but does well on 1 tab. Will allow increase she is aware this medicine compromises breathing some but chooses to proceed

## 2016-04-24 NOTE — Patient Instructions (Signed)

## 2016-04-25 ENCOUNTER — Other Ambulatory Visit: Payer: Self-pay | Admitting: Family Medicine

## 2016-04-25 DIAGNOSIS — M549 Dorsalgia, unspecified: Secondary | ICD-10-CM

## 2016-04-25 DIAGNOSIS — R933 Abnormal findings on diagnostic imaging of other parts of digestive tract: Secondary | ICD-10-CM

## 2016-04-25 LAB — LIPID PANEL
CHOL/HDL RATIO: 3
Cholesterol: 191 mg/dL (ref 0–200)
HDL: 76.5 mg/dL (ref >39.00)
NONHDL: 114.94
Triglycerides: 203 mg/dL — ABNORMAL HIGH (ref 0.0–149.0)
VLDL: 40.6 mg/dL — AB (ref 0.0–40.0)

## 2016-04-25 LAB — COMPREHENSIVE METABOLIC PANEL
ALT: 16 U/L (ref 0–35)
AST: 22 U/L (ref 0–37)
Albumin: 4.3 g/dL (ref 3.5–5.2)
Alkaline Phosphatase: 53 U/L (ref 39–117)
BUN: 22 mg/dL (ref 6–23)
CHLORIDE: 96 meq/L (ref 96–112)
CO2: 39 meq/L — AB (ref 19–32)
Calcium: 9.5 mg/dL (ref 8.4–10.5)
Creatinine, Ser: 0.46 mg/dL (ref 0.40–1.20)
GFR: 141.22 mL/min (ref >60.00)
GLUCOSE: 107 mg/dL — AB (ref 70–99)
POTASSIUM: 4.5 meq/L (ref 3.5–5.1)
SODIUM: 142 meq/L (ref 135–145)
Total Bilirubin: 0.3 mg/dL (ref 0.2–1.2)
Total Protein: 7.1 g/dL (ref 6.0–8.3)

## 2016-04-25 LAB — URINALYSIS
BILIRUBIN URINE: NEGATIVE
Hgb urine dipstick: NEGATIVE
KETONES UR: NEGATIVE
Leukocytes, UA: NEGATIVE
Nitrite: NEGATIVE
SPECIFIC GRAVITY, URINE: 1.01 (ref 1.000–1.030)
Total Protein, Urine: NEGATIVE
URINE GLUCOSE: NEGATIVE
UROBILINOGEN UA: 0.2 (ref 0.0–1.0)
pH: 6.5 (ref 5.0–8.0)

## 2016-04-25 LAB — LDL CHOLESTEROL, DIRECT: LDL DIRECT: 70 mg/dL

## 2016-04-25 LAB — CBC
HEMATOCRIT: 40.7 % (ref 36.0–46.0)
HEMOGLOBIN: 13.2 g/dL (ref 12.0–15.0)
MCHC: 32.4 g/dL (ref 30.0–36.0)
MCV: 91 fl (ref 78.0–100.0)
Platelets: 368 10*3/uL (ref 150.0–400.0)
RBC: 4.47 Mil/uL (ref 3.87–5.11)
RDW: 14.6 % (ref 11.5–15.5)
WBC: 13.1 10*3/uL — ABNORMAL HIGH (ref 4.0–10.5)

## 2016-04-25 LAB — HEMOGLOBIN A1C: Hgb A1c MFr Bld: 5.9 % (ref 4.6–6.5)

## 2016-04-25 LAB — TSH: TSH: 1.53 u[IU]/mL (ref 0.35–4.50)

## 2016-04-26 ENCOUNTER — Encounter (HOSPITAL_COMMUNITY): Payer: Medicare PPO

## 2016-04-26 LAB — URINE CULTURE

## 2016-04-27 ENCOUNTER — Ambulatory Visit (HOSPITAL_BASED_OUTPATIENT_CLINIC_OR_DEPARTMENT_OTHER)
Admission: RE | Admit: 2016-04-27 | Discharge: 2016-04-27 | Disposition: A | Discharge status: Home / Self Care | Payer: Medicare PPO | Source: Ambulatory Visit | Attending: Family Medicine | Admitting: Family Medicine

## 2016-04-27 DIAGNOSIS — R933 Abnormal findings on diagnostic imaging of other parts of digestive tract: Secondary | ICD-10-CM | POA: Diagnosis not present

## 2016-04-27 DIAGNOSIS — M549 Dorsalgia, unspecified: Secondary | ICD-10-CM | POA: Insufficient documentation

## 2016-04-27 DIAGNOSIS — R197 Diarrhea, unspecified: Secondary | ICD-10-CM | POA: Diagnosis not present

## 2016-04-29 DIAGNOSIS — M545 Low back pain, unspecified: Secondary | ICD-10-CM | POA: Insufficient documentation

## 2016-04-29 NOTE — Assessment & Plan Note (Signed)
Avoid offending foods, start probiotics. Do not eat large meals in late evening and consider raising head of bed.  

## 2016-04-29 NOTE — Assessment & Plan Note (Signed)
Encouraged to get adequate exercise, calcium and vitamin d intake 

## 2016-04-29 NOTE — Assessment & Plan Note (Signed)
minimize simple carbs. Increase exercise as tolerated.  

## 2016-04-29 NOTE — Assessment & Plan Note (Signed)
Encouraged moist heat and gentle stretching as tolerated. May try NSAIDs and prescription meds as directed and report if symptoms worsen or seek immediate care 

## 2016-04-29 NOTE — Progress Notes (Signed)
Patient ID: [name], female   DOB: 05/19/1941, 75 y.o.   MRN: [id number]   Subjective:    Patient ID: [name], female    DOB: [birth year], 75 y.o.   MRN: [id number]  Chief Complaint  Patient presents with  . Back Pain    HPI Patient is in today for follow up and worsening back pain. No recent fall or injury or incontinence. No radiculopathy. Also notes urinary frequency. No dysuria. Notes increased energy and decreased sob since a recent treatment. Denies CP/palp/HA/congestion/fevers/GI or GU c/o. Taking meds as prescribed  Past Medical History  Diagnosis Date  . COPD (chronic obstructive pulmonary disease) ([place])   . Thyroid disease   . Glucose intolerance (impaired glucose tolerance)   . History of small bowel obstruction   . Arthralgia   . Dyspnea   . Pallor   . Chest pain   . Osteoporosis   . Dysuria   . Weight loss   . Edema   . Weakness   . Anxiety   . Depression   . Migraine   . Hypertension   . Leukocytosis   . GERD (gastroesophageal reflux disease)   . Atrophic vaginitis 12/07/2012  . Hx of adenomatous colonic polyps   . Ischemic colitis ([place])   . Nocturia 09/02/2013  . Diarrhea 09/02/2013  . Hyperglycemia 12/01/2013  . Hypokalemia 11/28/2014  . Clostridium difficile diarrhea 12/27/2014  . Medicare annual wellness visit, subsequent 01/12/2016    Past Surgical History  Procedure Laterality Date  . Cholecystectomy    . Abdominal hysterectomy  1993  . Tubal ligation    . Bladder repair    . Dilation and curettage of uterus      40 yrs ago  . Eye surgery  04/2011    bil. eyes  . Colonoscopy with propofol N/A 06/08/2013    Procedure: COLONOSCOPY WITH PROPOFOL;  Surgeon: Lafayette Dragon, MD;  Location: [facility];  Service: Endoscopy;  Laterality: N/A;    Family History  Problem Relation Age of Onset  . Alcohol abuse Mother   . Heart disease Mother   . Stroke Mother   . Hypertension Mother   . Kidney disease Mother   . COPD Mother   .  Osteoporosis Mother     hip fracture  . COPD Brother   . Alcohol abuse Brother     drug abuse  . Hearing loss Brother     mvp  . Colon cancer Paternal Aunt   . Prostate cancer    . Alcohol abuse Father     MVA  . Thyroid disease Daughter   . COPD Paternal Grandfather   . COPD Sister   . Hypertension Sister   . Anxiety disorder Sister   . COPD Sister   . COPD Brother     current smoker  . Hearing loss Brother     cad  . COPD Brother     Social History   Social History  . Marital Status: Married    Spouse Name: N/A  . Number of Children: 1  . Years of Education: N/A   Occupational History  . Retired Hydrologist    Social History Main Topics  . Smoking status: Former Smoker -- 2.00 packs/day for 40 years    Types: Cigarettes    Quit date: 10/29/2001  . Smokeless tobacco: Never Used  . Alcohol Use: No  . Drug Use: No  . Sexual Activity: Not on file   Other Topics  Concern  . Not on file   Social History Narrative    Outpatient Prescriptions Prior to Visit  Medication Sig Dispense Refill  . albuterol (PROVENTIL HFA;VENTOLIN HFA) 108 (90 BASE) MCG/ACT inhaler Inhale 2 puffs into the lungs every 6 (six) hours as needed for wheezing or shortness of breath. 3 Inhaler 3  . albuterol (PROVENTIL) (2.5 MG/3ML) 0.083% nebulizer solution Take 3 mLs (2.5 mg total) by nebulization every 4 (four) hours as needed for wheezing or shortness of breath. Dx 496 300 mL 0  . budesonide (PULMICORT) 0.25 MG/2ML nebulizer solution Take 2 mLs (0.25 mg total) by nebulization 2 (two) times daily. Dx: J44.9 360 mL 3  . Calcium Carbonate-Vitamin D 600-400 MG-UNIT per tablet Take 1 tablet by mouth 2 (two) times daily.      . citalopram (CELEXA) 20 MG tablet Take 1 tablet (20 mg total) by mouth daily. 30 tablet 2  . Ferrous Fumarate-Folic Acid (HEMOCYTE-F) 324-1 MG TABS Take 1 tablet by mouth daily. 30 each 5  . fluticasone (FLONASE) 50 MCG/ACT nasal spray Place 2 sprays into  both nostrils 2 (two) times daily.    . Glucosamine-Chondroitin (GLUCOSAMINE CHONDR COMPLEX PO) Take 1 tablet by mouth 2 (two) times daily.     . metoprolol succinate (TOPROL-XL) 25 MG 24 hr tablet Take 1 tablet (25 mg total) by mouth daily. 90 tablet 3  . mirtazapine (REMERON) 15 MG tablet Take 1 tablet (15 mg total) by mouth at bedtime. 90 tablet 1  . ondansetron (ZOFRAN) 4 MG tablet Take 1 tablet (4 mg total) by mouth every 8 (eight) hours as needed for nausea or vomiting. 9 tablet 0  . predniSONE (DELTASONE) 10 MG tablet Take 1 tablet (10 mg total) by mouth daily. (Patient taking differently: Take 5 mg by mouth daily. ) 90 tablet 3  . ranitidine (ZANTAC) 300 MG capsule Take 1 capsule (300 mg total) by mouth every evening. 90 capsule 1  . Tiotropium Bromide-Olodaterol (STIOLTO RESPIMAT) 2.5-2.5 MCG/ACT AERS Inhale 2 puffs into the lungs daily. 1 Inhaler 0  . ALPRAZolam (XANAX) 0.5 MG tablet Take 1/2 tablet twice daily as needed 180 tablet 2  . Tiotropium Bromide-Olodaterol (STIOLTO RESPIMAT) 2.5-2.5 MCG/ACT AERS Inhale 2 puffs into the lungs daily. 3 Inhaler 3   No facility-administered medications prior to visit.    Allergies  Allergen Reactions  . Morphine Other (See Comments)    REACTION: nightmares  . Ambien [Zolpidem] Anxiety  . Cymbalta [Duloxetine Hcl] Anxiety  . Nitrofurantoin Rash    Review of Systems  Constitutional: Positive for malaise/fatigue. Negative for fever.  HENT: Negative for congestion.   Eyes: Negative for blurred vision.  Respiratory: Positive for shortness of breath.   Cardiovascular: Negative for chest pain, palpitations and leg swelling.  Gastrointestinal: Negative for nausea, abdominal pain and blood in stool.  Genitourinary: Negative for dysuria and frequency.  Musculoskeletal: Positive for back pain. Negative for falls.  Skin: Negative for rash.  Neurological: Negative for dizziness, loss of consciousness and headaches.  Endo/Heme/Allergies: Negative  for environmental allergies.  Psychiatric/Behavioral: Negative for depression. The patient is not nervous/anxious.        Objective:    Physical Exam  Constitutional: She is oriented to person, place, and time. She appears well-developed and well-nourished. No distress.  HENT:  Head: Normocephalic and atraumatic.  Nose: Nose normal.  Eyes: Right eye exhibits no discharge. Left eye exhibits no discharge.  Neck: Normal range of motion. Neck supple.  Cardiovascular: Normal rate and regular rhythm.  No murmur heard. Pulmonary/Chest: Effort normal and breath sounds normal.  O2 via [place]  Abdominal: Soft. Bowel sounds are normal. There is no tenderness.  Musculoskeletal: She exhibits no edema.  Neurological: She is alert and oriented to person, place, and time.  Skin: Skin is warm and dry.  Psychiatric: She has a normal mood and affect.  Nursing note and vitals reviewed.   BP 122/78 mmHg  Pulse 78  Temp(Src) 98.2 F (36.8 C) (Oral)  Ht 5\' 1"  (1.549 m)  Wt 97 lb 6 oz (44.169 kg)  BMI 18.41 kg/m2  SpO2 99% Wt Readings from Last 3 Encounters:  04/24/16 97 lb 6 oz (44.169 kg)  02/24/16 96 lb 5.5 oz (43.7 kg)  02/16/16 96 lb (43.545 kg)     Lab Results  Component Value Date   WBC 13.1* 04/24/2016   HGB 13.2 04/24/2016   HCT 40.7 04/24/2016   PLT 368.0 04/24/2016   GLUCOSE 107* 04/24/2016   CHOL 191 04/24/2016   TRIG 203.0* 04/24/2016   HDL 76.50 04/24/2016   LDLDIRECT 70.0 04/24/2016   LDLCALC 76 01/12/2016   ALT 16 04/24/2016   AST 22 04/24/2016   NA 142 04/24/2016   K 4.5 04/24/2016   CL 96 04/24/2016   CREATININE 0.46 04/24/2016   BUN 22 04/24/2016   CO2 39* 04/24/2016   TSH 1.53 04/24/2016   HGBA1C 5.9 04/24/2016    Lab Results  Component Value Date   TSH 1.53 04/24/2016   Lab Results  Component Value Date   WBC 13.1* 04/24/2016   HGB 13.2 04/24/2016   HCT 40.7 04/24/2016   MCV 91.0 04/24/2016   PLT 368.0 04/24/2016   Lab Results  Component Value  Date   NA 142 04/24/2016   K 4.5 04/24/2016   CO2 39* 04/24/2016   GLUCOSE 107* 04/24/2016   BUN 22 04/24/2016   CREATININE 0.46 04/24/2016   BILITOT 0.3 04/24/2016   ALKPHOS 53 04/24/2016   AST 22 04/24/2016   ALT 16 04/24/2016   PROT 7.1 04/24/2016   ALBUMIN 4.3 04/24/2016   CALCIUM 9.5 04/24/2016   ANIONGAP 5 11/14/2014   GFR 141.22 04/24/2016   Lab Results  Component Value Date   CHOL 191 04/24/2016   Lab Results  Component Value Date   HDL 76.50 04/24/2016   Lab Results  Component Value Date   LDLCALC 76 01/12/2016   Lab Results  Component Value Date   TRIG 203.0* 04/24/2016   Lab Results  Component Value Date   CHOLHDL 3 04/24/2016   Lab Results  Component Value Date   HGBA1C 5.9 04/24/2016       Assessment & Plan:   Problem List Items Addressed This Visit    Hypothyroidism    On Levothyroxine, continue to monitor      Relevant Medications   ALPRAZolam (XANAX) 0.5 MG tablet   Other Relevant Orders   TSH (Completed)   CBC (Completed)   Hemoglobin A1c (Completed)   Lipid panel (Completed)   Comprehensive metabolic panel (Completed)   HYPERTENSION, BENIGN ESSENTIAL - Primary    Well controlled, no changes to meds. Encouraged heart healthy diet such as the DASH diet and exercise as tolerated.       Relevant Medications   ALPRAZolam (XANAX) 0.5 MG tablet   Other Relevant Orders   TSH (Completed)   CBC (Completed)   Hemoglobin A1c (Completed)   Lipid panel (Completed)   Comprehensive metabolic panel (Completed)   GERD    Avoid  offending foods, start probiotics. Do not eat large meals in late evening and consider raising head of bed.       Relevant Medications   ALPRAZolam (XANAX) 0.5 MG tablet   Other Relevant Orders   TSH (Completed)   CBC (Completed)   Hemoglobin A1c (Completed)   Lipid panel (Completed)   Comprehensive metabolic panel (Completed)   Osteoporosis    Encouraged to get adequate exercise, calcium and vitamin d intake        COPD mixed type ([place])     Doing much better since going to [facility] for treatment but does O2 around the clock      Relevant Medications   ALPRAZolam (XANAX) 0.5 MG tablet   Other Relevant Orders   TSH (Completed)   CBC (Completed)   Hemoglobin A1c (Completed)   Lipid panel (Completed)   Comprehensive metabolic panel (Completed)   Hyperglycemia    minimize simple carbs. Increase exercise as tolerated.      Relevant Medications   ALPRAZolam (XANAX) 0.5 MG tablet   Other Relevant Orders   TSH (Completed)   CBC (Completed)   Hemoglobin A1c (Completed)   Lipid panel (Completed)   Comprehensive metabolic panel (Completed)   Insomnia    Encouraged good sleep hygiene such as dark, quiet room. No blue/green glowing lights such as computer screens in bedroom. No alcohol or stimulants in evening. Cut down on caffeine as able. Regular exercise is helpful but not just prior to bed time. Not sleeping well on 1/2 of an Alprazolam but does well on 1 tab. Will allow increase she is aware this medicine compromises breathing some but chooses to proceed      Relevant Orders   TSH (Completed)   CBC (Completed)   Hemoglobin A1c (Completed)   Lipid panel (Completed)   Comprehensive metabolic panel (Completed)   Dysuria    With abdominal and back pain,urinary frequency, urgency. No hematuria but describes suprapubic warmth or fever.      Relevant Orders   TSH (Completed)   CBC (Completed)   Hemoglobin A1c (Completed)   Lipid panel (Completed)   Comprehensive metabolic panel (Completed)   Hypokalemia   Relevant Medications   ALPRAZolam (XANAX) 0.5 MG tablet   Other Relevant Orders   TSH (Completed)   CBC (Completed)   Hemoglobin A1c (Completed)   Lipid panel (Completed)   Comprehensive metabolic panel (Completed)   Low back pain    Encouraged moist heat and gentle stretching as tolerated. May try NSAIDs and prescription meds as directed and report if symptoms worsen or seek  immediate care      Relevant Orders   DG Lumbar Spine Complete (Completed)   TSH (Completed)   CBC (Completed)   Hemoglobin A1c (Completed)   Lipid panel (Completed)   Comprehensive metabolic panel (Completed)    Other Visit Diagnoses    Anxiety and depression        Relevant Medications    ALPRAZolam (XANAX) 0.5 MG tablet    Other Relevant Orders    TSH (Completed)    CBC (Completed)    Hemoglobin A1c (Completed)    Lipid panel (Completed)    Comprehensive metabolic panel (Completed)    Urinary frequency        Relevant Orders    Urinalysis (Completed)    Urine culture (Completed)    TSH (Completed)    CBC (Completed)    Hemoglobin A1c (Completed)    Lipid panel (Completed)    Comprehensive metabolic panel (Completed)  I have changed Ms. Louk's ALPRAZolam. I am also having her maintain her Calcium Carbonate-Vitamin D, Glucosamine-Chondroitin (GLUCOSAMINE CHONDR COMPLEX PO), albuterol, fluticasone, ondansetron, albuterol, budesonide, Ferrous Fumarate-Folic Acid, metoprolol succinate, mirtazapine, predniSONE, ranitidine, citalopram, and Tiotropium Bromide-Olodaterol.  Meds ordered this encounter  Medications  . ALPRAZolam (XANAX) 0.5 MG tablet    Sig: Take 1 tablet (0.5 mg total) by mouth 2 (two) times daily as needed for anxiety.    Dispense:  180 tablet    Refill:  1     Penni Homans, MD

## 2016-05-01 ENCOUNTER — Encounter (HOSPITAL_COMMUNITY): Payer: Medicare PPO

## 2016-05-03 ENCOUNTER — Encounter (HOSPITAL_COMMUNITY): Payer: Medicare PPO

## 2016-05-08 ENCOUNTER — Encounter (HOSPITAL_COMMUNITY): Payer: Medicare PPO

## 2016-05-08 ENCOUNTER — Encounter: Payer: Self-pay | Admitting: Family Medicine

## 2016-05-09 DIAGNOSIS — R0602 Shortness of breath: Secondary | ICD-10-CM | POA: Diagnosis not present

## 2016-05-09 MED ORDER — DOXYCYCLINE HYCLATE 100 MG PO TABS: 100.0000 mg | ORAL_TABLET | Freq: Two times a day (BID) | ORAL | Status: DC

## 2016-05-09 NOTE — Telephone Encounter (Signed)
Please check with patient and start her on an doxycycline 100 mg po bid x 7 days and schedule her to see me within next week if possible. Have her seek care if symptoms are worsening--Dr. B   Called patient.  She says she is feeling much better this morning, but continues to have symptoms.  Provider's above recommendation discussed.  She agreed with plan.  Rx sent to preferred pharmacy and follow up appt scheduled.  She was encouraged to call if symptoms worsen or fail to improve.

## 2016-05-10 ENCOUNTER — Encounter (HOSPITAL_COMMUNITY): Payer: Medicare PPO

## 2016-05-15 ENCOUNTER — Ambulatory Visit: Payer: Medicare PPO | Admitting: Family Medicine

## 2016-05-15 ENCOUNTER — Encounter (HOSPITAL_COMMUNITY): Payer: Medicare PPO

## 2016-05-17 ENCOUNTER — Encounter (HOSPITAL_COMMUNITY): Payer: Medicare PPO

## 2016-05-22 ENCOUNTER — Encounter (HOSPITAL_COMMUNITY): Payer: Medicare PPO

## 2016-05-24 ENCOUNTER — Telehealth: Payer: Self-pay

## 2016-05-24 ENCOUNTER — Encounter (HOSPITAL_COMMUNITY): Payer: Medicare PPO

## 2016-05-24 MED ORDER — CITALOPRAM HYDROBROMIDE 20 MG PO TABS: 20.0000 mg | ORAL_TABLET | Freq: Every day | ORAL | 0 refills | Status: DC

## 2016-05-24 NOTE — Telephone Encounter (Signed)
Patient informed prescription sent in

## 2016-05-24 NOTE — Telephone Encounter (Signed)
Will refill her celexa at 20 mg to take one tab a day. Will give one month. Then pcp can refill thereafter. Please notify pt. I sent to her local pharmacy. Message I got as she was out for 2 weeks and second request.

## 2016-05-24 NOTE — Telephone Encounter (Signed)
Patient needs a refill for Citalopram patient is out of medication for two weeks now. Can send to [place] for pick up. Patient states this is her second request. Call at [phone] routing to covering provider

## 2016-05-29 ENCOUNTER — Encounter (HOSPITAL_COMMUNITY): Payer: Medicare PPO

## 2016-05-31 ENCOUNTER — Encounter (HOSPITAL_COMMUNITY): Payer: Medicare PPO

## 2016-05-31 ENCOUNTER — Ambulatory Visit (INDEPENDENT_AMBULATORY_CARE_PROVIDER_SITE_OTHER): Payer: Medicare PPO | Admitting: Adult Health

## 2016-05-31 ENCOUNTER — Encounter: Payer: Self-pay | Admitting: Adult Health

## 2016-05-31 DIAGNOSIS — J449 Chronic obstructive pulmonary disease, unspecified: Secondary | ICD-10-CM

## 2016-05-31 DIAGNOSIS — J9611 Chronic respiratory failure with hypoxia: Secondary | ICD-10-CM | POA: Diagnosis not present

## 2016-05-31 NOTE — Patient Instructions (Addendum)
Continue on Prednisone 10mg  daily  May stop Stiolto.  Continue on Brovana and Budesonide Neb Twice daily  .  Continue on 2 L rest and 3l/m activity .  Follow up Dr. Elsworth Soho  3 -4 months and As needed

## 2016-06-01 NOTE — Assessment & Plan Note (Signed)
Continue on Prednisone 10mg  daily  May stop Stiolto.  Continue on Brovana and Budesonide Neb Twice daily  .  Continue on 2 L rest and 3l/m activity .  Follow up Dr. Elsworth Soho  3 -4 months and As needed

## 2016-06-01 NOTE — Progress Notes (Signed)
Subjective:    Patient ID: SHINEKA WANDS, female    DOB: 1941/08/07, 75 y.o.   MRN: [id number]  HPI 51 yowf , heavy ex- smoker, quit 2002 with GOLD D COPD FEV1 26% 6/10 on 24 h O2 since 2009.  -completed pulm rehab oct '11  Recurrent flares 2-3/yr requiring steroid bursts  She uses 3 liters 24/7. On a regimen of budesonide/brovana & alb/atrovent nebs   Significant tests/ events  Admitted 12/2012 for COPD exacerbation .ABG 7.39/55/75 on 3L [place]  She required low dose prednisone since her admit in 02/2013   Had colonoscopy with polyp removal 06/2013  08/2013 BL pneumonia  03/2014 ONO on 2L >> no sig desatn  CT chest 10/2014 neg PE  12/06/2014 >>2.5 L at rest , 3L on exertion - ONO ok in the past    06/01/2016 Follow up : COPD and Chronic Resp Failure on O2  Pt returns for  3 month follow up. Remains on budesonide neb Twice daily   Started on Stiolto last ov but did not feel it is working . Wants to go back on Brovana .  On Prednisone 10mg   daily . Feels this worse best.  Has some family stress with granddaughter. Support provided.  Flu /Prevnar/PVX utd.  Remains on oxygen 2l/m rest and 3l/m act.  Denies chest pain, orthopnea, edema or fever or discolored mucus .      Past Medical History:  Diagnosis Date  . Anxiety   . Arthralgia   . Atrophic vaginitis 12/07/2012  . Chest pain   . Clostridium difficile diarrhea 12/27/2014  . COPD (chronic obstructive pulmonary disease) ([place])   . Depression   . Diarrhea 09/02/2013  . Dyspnea   . Dysuria   . Edema   . GERD (gastroesophageal reflux disease)   . Glucose intolerance (impaired glucose tolerance)   . History of small bowel obstruction   . Hx of adenomatous colonic polyps   . Hyperglycemia 12/01/2013  . Hypertension   . Hypokalemia 11/28/2014  . Ischemic colitis ([place])   . Leukocytosis   . Medicare annual wellness visit, subsequent 01/12/2016  . Migraine   . Nocturia 09/02/2013  . Osteoporosis   . Pallor   . Thyroid disease    . Weakness   . Weight loss    Current Outpatient Prescriptions on File Prior to Visit  Medication Sig Dispense Refill  . albuterol (PROVENTIL HFA;VENTOLIN HFA) 108 (90 BASE) MCG/ACT inhaler Inhale 2 puffs into the lungs every 6 (six) hours as needed for wheezing or shortness of breath. 3 Inhaler 3  . albuterol (PROVENTIL) (2.5 MG/3ML) 0.083% nebulizer solution Take 3 mLs (2.5 mg total) by nebulization every 4 (four) hours as needed for wheezing or shortness of breath. Dx 496 300 mL 0  . ALPRAZolam (XANAX) 0.5 MG tablet Take 1 tablet (0.5 mg total) by mouth 2 (two) times daily as needed for anxiety. 180 tablet 1  . budesonide (PULMICORT) 0.25 MG/2ML nebulizer solution Take 2 mLs (0.25 mg total) by nebulization 2 (two) times daily. Dx: J44.9 360 mL 3  . Calcium Carbonate-Vitamin D 600-400 MG-UNIT per tablet Take 1 tablet by mouth 2 (two) times daily.      . citalopram (CELEXA) 20 MG tablet Take 1 tablet (20 mg total) by mouth daily. 30 tablet 0  . Ferrous Fumarate-Folic Acid (HEMOCYTE-F) 324-1 MG TABS Take 1 tablet by mouth daily. 30 each 5  . fluticasone (FLONASE) 50 MCG/ACT nasal spray Place 2 sprays into both nostrils 2 (  two) times daily as needed.     . Glucosamine-Chondroitin (GLUCOSAMINE CHONDR COMPLEX PO) Take 1 tablet by mouth 2 (two) times daily.     . metoprolol succinate (TOPROL-XL) 25 MG 24 hr tablet Take 1 tablet (25 mg total) by mouth daily. 90 tablet 3  . mirtazapine (REMERON) 15 MG tablet Take 1 tablet (15 mg total) by mouth at bedtime. 90 tablet 1  . ondansetron (ZOFRAN) 4 MG tablet Take 1 tablet (4 mg total) by mouth every 8 (eight) hours as needed for nausea or vomiting. 9 tablet 0  . predniSONE (DELTASONE) 10 MG tablet Take 1 tablet (10 mg total) by mouth daily. (Patient taking differently: Take 5 mg by mouth daily. ) 90 tablet 3  . ranitidine (ZANTAC) 300 MG capsule Take 1 capsule (300 mg total) by mouth every evening. 90 capsule 1  . Tiotropium Bromide-Olodaterol (STIOLTO  RESPIMAT) 2.5-2.5 MCG/ACT AERS Inhale 2 puffs into the lungs daily. 1 Inhaler 0   No current facility-administered medications on file prior to visit.       Review of Systems neg for any significant sore throat, dysphagia, itching, sneezing, nasal congestion or excess/ purulent secretions, fever, chills, sweats, unintended wt loss, pleuritic or exertional cp, hempoptysis, orthopnea pnd or change in chronic leg swelling. Also denies presyncope, palpitations, heartburn, abdominal pain, nausea, vomiting, diarrhea or change in bowel or urinary habits, dysuria,hematuria, rash, arthralgias, visual complaints, headache, numbness weakness or ataxia.     Objective:   Physical Exam Vitals:   05/31/16 1107  BP: (!) 145/73  Pulse: 87  Temp: 97.7 F (36.5 C)  TempSrc: Oral  SpO2: 97%  Weight: 99 lb (44.9 kg)  Height: 5\' 1"  (1.549 m)    Gen. Pleasant, frail /elderly  in no distress VS reviewed  ENT - no lesions, no post nasal drip Neck: No JVD, no thyromegaly, no carotid bruits Lungs: no use of accessory muscles, no dullness to percussion, decreased BS bases ,  Cardiovascular: Rhythm regular, heart sounds  normal, no murmurs or gallops, no peripheral edema Musculoskeletal: No deformities, no cyanosis or clubbing       Tammy Parrett NP-C  [facility]  05/31/16

## 2016-06-05 ENCOUNTER — Encounter (HOSPITAL_COMMUNITY): Payer: Medicare PPO

## 2016-06-07 ENCOUNTER — Encounter (HOSPITAL_COMMUNITY): Payer: Medicare PPO

## 2016-06-09 DIAGNOSIS — R0602 Shortness of breath: Secondary | ICD-10-CM | POA: Diagnosis not present

## 2016-06-12 ENCOUNTER — Encounter (HOSPITAL_COMMUNITY): Payer: Medicare PPO

## 2016-06-14 ENCOUNTER — Encounter (HOSPITAL_COMMUNITY): Payer: Medicare PPO

## 2016-06-19 ENCOUNTER — Encounter (HOSPITAL_COMMUNITY): Payer: Medicare PPO

## 2016-06-21 ENCOUNTER — Encounter (HOSPITAL_COMMUNITY): Payer: Medicare PPO

## 2016-06-23 ENCOUNTER — Other Ambulatory Visit: Payer: Self-pay | Admitting: Medical

## 2016-06-26 ENCOUNTER — Ambulatory Visit (HOSPITAL_BASED_OUTPATIENT_CLINIC_OR_DEPARTMENT_OTHER)
Admission: RE | Admit: 2016-06-26 | Discharge: 2016-06-26 | Disposition: A | Discharge status: Home / Self Care | Payer: Medicare PPO | Source: Ambulatory Visit | Attending: Family Medicine | Admitting: Family Medicine

## 2016-06-26 ENCOUNTER — Ambulatory Visit (INDEPENDENT_AMBULATORY_CARE_PROVIDER_SITE_OTHER): Payer: Medicare PPO | Admitting: Family Medicine

## 2016-06-26 ENCOUNTER — Encounter: Payer: Self-pay | Admitting: Family Medicine

## 2016-06-26 VITALS — BP 120/70 | HR 91 | Temp 98.5°F | Ht 61.0 in | Wt 98.5 lb

## 2016-06-26 DIAGNOSIS — R05 Cough: Secondary | ICD-10-CM | POA: Diagnosis present

## 2016-06-26 DIAGNOSIS — J209 Acute bronchitis, unspecified: Secondary | ICD-10-CM | POA: Insufficient documentation

## 2016-06-26 DIAGNOSIS — R350 Frequency of micturition: Secondary | ICD-10-CM | POA: Diagnosis not present

## 2016-06-26 DIAGNOSIS — Z23 Encounter for immunization: Secondary | ICD-10-CM | POA: Diagnosis not present

## 2016-06-26 DIAGNOSIS — E039 Hypothyroidism, unspecified: Secondary | ICD-10-CM

## 2016-06-26 DIAGNOSIS — I10 Essential (primary) hypertension: Secondary | ICD-10-CM | POA: Diagnosis not present

## 2016-06-26 DIAGNOSIS — D72829 Elevated white blood cell count, unspecified: Secondary | ICD-10-CM

## 2016-06-26 DIAGNOSIS — J449 Chronic obstructive pulmonary disease, unspecified: Secondary | ICD-10-CM | POA: Diagnosis not present

## 2016-06-26 DIAGNOSIS — M810 Age-related osteoporosis without current pathological fracture: Secondary | ICD-10-CM | POA: Diagnosis not present

## 2016-06-26 DIAGNOSIS — R739 Hyperglycemia, unspecified: Secondary | ICD-10-CM

## 2016-06-26 MED ORDER — LEVOFLOXACIN 250 MG PO TABS: 250.0000 mg | ORAL_TABLET | Freq: Every day | ORAL | 0 refills | Status: DC

## 2016-06-26 NOTE — Progress Notes (Signed)
Pre visit review using our clinic review tool, if applicable. No additional management support is needed unless otherwise documented below in the visit note. 

## 2016-06-26 NOTE — Patient Instructions (Signed)

## 2016-06-28 ENCOUNTER — Other Ambulatory Visit: Payer: Self-pay

## 2016-06-28 MED ORDER — CITALOPRAM HYDROBROMIDE 20 MG PO TABS: 20.0000 mg | ORAL_TABLET | Freq: Every day | ORAL | 0 refills | Status: DC

## 2016-07-08 NOTE — Assessment & Plan Note (Addendum)
Continues to struggle with SOB and is following with pulmonology. CXR confirms advanced COPD

## 2016-07-08 NOTE — Assessment & Plan Note (Signed)
Well controlled, no changes to meds. Encouraged heart healthy diet such as the DASH diet and exercise as tolerated.  °

## 2016-07-08 NOTE — Progress Notes (Signed)
Patient ID: KADISHA WOLFENBARGER, female   DOB: 02/07/1941, 75 y.o.   MRN: [id number]   Subjective:    Patient ID: MERELYN DEGROSS, female    DOB: 05/16/1941, 75 y.o.   MRN: [id number]  Chief Complaint  Patient presents with  . COPD    HPI Patient is in today for follow up. She continues to struggle with SOB with any exertion. She notes fatigue and anorexia as well. Has been having regular bowel movements without blood but she does note some abdominal bloating at times. Denies CP/palp/HA/congestion/fevers or GU c/o. Taking meds as prescribed  Past Medical History:  Diagnosis Date  . Anxiety   . Arthralgia   . Atrophic vaginitis 12/07/2012  . Chest pain   . Clostridium difficile diarrhea 12/27/2014  . COPD (chronic obstructive pulmonary disease) ([place])   . Depression   . Diarrhea 09/02/2013  . Dyspnea   . Dysuria   . Edema   . GERD (gastroesophageal reflux disease)   . Glucose intolerance (impaired glucose tolerance)   . History of small bowel obstruction   . Hx of adenomatous colonic polyps   . Hyperglycemia 12/01/2013  . Hypertension   . Hypokalemia 11/28/2014  . Ischemic colitis ([place])   . Leukocytosis   . Medicare annual wellness visit, subsequent 01/12/2016  . Migraine   . Nocturia 09/02/2013  . Osteoporosis   . Pallor   . Thyroid disease   . Weakness   . Weight loss     Past Surgical History:  Procedure Laterality Date  . ABDOMINAL HYSTERECTOMY  1993  . BLADDER REPAIR    . CHOLECYSTECTOMY    . COLONOSCOPY WITH PROPOFOL N/A 06/08/2013   Procedure: COLONOSCOPY WITH PROPOFOL;  Surgeon: Lafayette Dragon, MD;  Location: [facility];  Service: Endoscopy;  Laterality: N/A;  . DILATION AND CURETTAGE OF UTERUS     40 yrs ago  . EYE SURGERY  04/2011   bil. eyes  . TUBAL LIGATION      Family History  Problem Relation Age of Onset  . Alcohol abuse Mother   . Heart disease Mother   . Stroke Mother   . Hypertension Mother   . Kidney disease Mother   . COPD Mother   .  Osteoporosis Mother     hip fracture  . COPD Brother   . Alcohol abuse Brother     drug abuse  . Hearing loss Brother     mvp  . Colon cancer Paternal Aunt   . Prostate cancer    . Alcohol abuse Father     MVA  . Thyroid disease Daughter   . COPD Paternal Grandfather   . COPD Sister   . Hypertension Sister   . Anxiety disorder Sister   . COPD Sister   . COPD Brother     current smoker  . Hearing loss Brother     cad  . COPD Brother     Social History   Social History  . Marital status: Married    Spouse name: N/A  . Number of children: 1  . Years of education: N/A   Occupational History  . Retired Hydrologist Retired   Social History Main Topics  . Smoking status: Former Smoker    Packs/day: 2.00    Years: 40.00    Types: Cigarettes    Quit date: 10/29/2001  . Smokeless tobacco: Never Used  . Alcohol use No  . Drug use: No  . Sexual activity: Not on file  Other Topics Concern  . Not on file   Social History Narrative  . No narrative on file    Outpatient Medications Prior to Visit  Medication Sig Dispense Refill  . albuterol (PROVENTIL HFA;VENTOLIN HFA) 108 (90 BASE) MCG/ACT inhaler Inhale 2 puffs into the lungs every 6 (six) hours as needed for wheezing or shortness of breath. 3 Inhaler 3  . albuterol (PROVENTIL) (2.5 MG/3ML) 0.083% nebulizer solution Take 3 mLs (2.5 mg total) by nebulization every 4 (four) hours as needed for wheezing or shortness of breath. Dx 496 300 mL 0  . ALPRAZolam (XANAX) 0.5 MG tablet Take 1 tablet (0.5 mg total) by mouth 2 (two) times daily as needed for anxiety. 180 tablet 1  . budesonide (PULMICORT) 0.25 MG/2ML nebulizer solution Take 2 mLs (0.25 mg total) by nebulization 2 (two) times daily. Dx: J44.9 360 mL 3  . Calcium Carbonate-Vitamin D 600-400 MG-UNIT per tablet Take 1 tablet by mouth 2 (two) times daily.      . Ferrous Fumarate-Folic Acid (HEMOCYTE-F) 324-1 MG TABS Take 1 tablet by mouth daily. 30 each 5   . fluticasone (FLONASE) 50 MCG/ACT nasal spray Place 2 sprays into both nostrils 2 (two) times daily as needed.     . Glucosamine-Chondroitin (GLUCOSAMINE CHONDR COMPLEX PO) Take 1 tablet by mouth 2 (two) times daily.     . metoprolol succinate (TOPROL-XL) 25 MG 24 hr tablet Take 1 tablet (25 mg total) by mouth daily. 90 tablet 3  . mirtazapine (REMERON) 15 MG tablet Take 1 tablet (15 mg total) by mouth at bedtime. 90 tablet 1  . ondansetron (ZOFRAN) 4 MG tablet Take 1 tablet (4 mg total) by mouth every 8 (eight) hours as needed for nausea or vomiting. 9 tablet 0  . predniSONE (DELTASONE) 10 MG tablet Take 1 tablet (10 mg total) by mouth daily. (Patient taking differently: Take 5 mg by mouth daily. ) 90 tablet 3  . ranitidine (ZANTAC) 300 MG capsule Take 1 capsule (300 mg total) by mouth every evening. 90 capsule 1  . Tiotropium Bromide-Olodaterol (STIOLTO RESPIMAT) 2.5-2.5 MCG/ACT AERS Inhale 2 puffs into the lungs daily. 1 Inhaler 0  . citalopram (CELEXA) 20 MG tablet Take 1 tablet (20 mg total) by mouth daily. 30 tablet 0   No facility-administered medications prior to visit.     Allergies  Allergen Reactions  . Morphine Other (See Comments)    REACTION: nightmares  . Ambien [Zolpidem] Anxiety  . Cymbalta [Duloxetine Hcl] Anxiety  . Nitrofurantoin Rash    Review of Systems  Constitutional: Negative for fever and malaise/fatigue.  HENT: Negative for congestion.   Eyes: Negative for blurred vision.  Respiratory: Positive for cough, sputum production and shortness of breath.   Cardiovascular: Negative for chest pain, palpitations and leg swelling.  Gastrointestinal: Negative for abdominal pain, blood in stool and nausea.  Genitourinary: Negative for dysuria and frequency.  Musculoskeletal: Negative for falls.  Skin: Negative for rash.  Neurological: Negative for dizziness, loss of consciousness and headaches.  Endo/Heme/Allergies: Negative for environmental allergies.    Psychiatric/Behavioral: Negative for depression. The patient is not nervous/anxious.        Objective:    Physical Exam  Constitutional: She is oriented to person, place, and time. She appears well-developed and well-nourished. No distress.  HENT:  Head: Normocephalic and atraumatic.  Nose: Nose normal.  Eyes: Right eye exhibits no discharge. Left eye exhibits no discharge.  Neck: Normal range of motion. Neck supple.  Cardiovascular: Normal rate and  regular rhythm.   No murmur heard. Pulmonary/Chest: Effort normal.  Diminished breath sounds b/l bases  Abdominal: Soft. Bowel sounds are normal. There is no tenderness.  Musculoskeletal: She exhibits no edema.  Neurological: She is alert and oriented to person, place, and time.  Skin: Skin is warm and dry.  Psychiatric: She has a normal mood and affect.  Nursing note and vitals reviewed.   BP 120/70 (BP Location: Left Arm, Patient Position: Sitting, Cuff Size: Normal)   Pulse 91   Temp 98.5 F (36.9 C) (Oral)   Ht 5\' 1"  (1.549 m)   Wt 98 lb 8 oz (44.7 kg)   SpO2 96%   BMI 18.61 kg/m  Wt Readings from Last 3 Encounters:  06/26/16 98 lb 8 oz (44.7 kg)  05/31/16 99 lb (44.9 kg)  04/24/16 97 lb 6 oz (44.2 kg)     Lab Results  Component Value Date   WBC 13.1 (H) 04/24/2016   HGB 13.2 04/24/2016   HCT 40.7 04/24/2016   PLT 368.0 04/24/2016   GLUCOSE 107 (H) 04/24/2016   CHOL 191 04/24/2016   TRIG 203.0 (H) 04/24/2016   HDL 76.50 04/24/2016   LDLDIRECT 70.0 04/24/2016   LDLCALC 76 01/12/2016   ALT 16 04/24/2016   AST 22 04/24/2016   NA 142 04/24/2016   K 4.5 04/24/2016   CL 96 04/24/2016   CREATININE 0.46 04/24/2016   BUN 22 04/24/2016   CO2 39 (H) 04/24/2016   TSH 1.53 04/24/2016   HGBA1C 5.9 04/24/2016    Lab Results  Component Value Date   TSH 1.53 04/24/2016   Lab Results  Component Value Date   WBC 13.1 (H) 04/24/2016   HGB 13.2 04/24/2016   HCT 40.7 04/24/2016   MCV 91.0 04/24/2016   PLT 368.0  04/24/2016   Lab Results  Component Value Date   NA 142 04/24/2016   K 4.5 04/24/2016   CO2 39 (H) 04/24/2016   GLUCOSE 107 (H) 04/24/2016   BUN 22 04/24/2016   CREATININE 0.46 04/24/2016   BILITOT 0.3 04/24/2016   ALKPHOS 53 04/24/2016   AST 22 04/24/2016   ALT 16 04/24/2016   PROT 7.1 04/24/2016   ALBUMIN 4.3 04/24/2016   CALCIUM 9.5 04/24/2016   ANIONGAP 5 11/14/2014   GFR 141.22 04/24/2016   Lab Results  Component Value Date   CHOL 191 04/24/2016   Lab Results  Component Value Date   HDL 76.50 04/24/2016   Lab Results  Component Value Date   LDLCALC 76 01/12/2016   Lab Results  Component Value Date   TRIG 203.0 (H) 04/24/2016   Lab Results  Component Value Date   CHOLHDL 3 04/24/2016   Lab Results  Component Value Date   HGBA1C 5.9 04/24/2016       Assessment & Plan:   Problem List Items Addressed This Visit    Hypothyroidism    On Levothyroxine, continue to monitor      HYPERTENSION, BENIGN ESSENTIAL    Well controlled, no changes to meds. Encouraged heart healthy diet such as the DASH diet and exercise as tolerated.       Osteoporosis    Encouraged to get adequate exercise, calcium and vitamin d intake      COPD mixed type ([place])    Continues to struggle with SOB and is following with pulmonology. CXR confirms advanced COPD      Hyperglycemia    hgba1c acceptable, minimize simple carbs. Increase exercise as tolerated.  Other Visit Diagnoses    Acute bronchitis, unspecified organism    -  Primary   Relevant Orders   DG Chest 2 View (Completed)   Encounter for immunization       Relevant Orders   Flu vaccine HIGH DOSE PF (Completed)      I have discontinued Ms. Check's citalopram. I am also having her start on levofloxacin. Additionally, I am having her maintain her Calcium Carbonate-Vitamin D, Glucosamine-Chondroitin (GLUCOSAMINE CHONDR COMPLEX PO), albuterol, fluticasone, ondansetron, albuterol, budesonide, Ferrous  Fumarate-Folic Acid, metoprolol succinate, mirtazapine, predniSONE, ranitidine, Tiotropium Bromide-Olodaterol, and ALPRAZolam.  Meds ordered this encounter  Medications  . levofloxacin (LEVAQUIN) 250 MG tablet    Sig: Take 1 tablet (250 mg total) by mouth daily.    Dispense:  7 tablet    Refill:  0     Penni Homans, MD

## 2016-07-08 NOTE — Assessment & Plan Note (Signed)
Encouraged to get adequate exercise, calcium and vitamin d intake 

## 2016-07-08 NOTE — Assessment & Plan Note (Signed)
hgba1c acceptable, minimize simple carbs. Increase exercise as tolerated.  

## 2016-07-08 NOTE — Assessment & Plan Note (Signed)
On Levothyroxine, continue to monitor 

## 2016-07-10 DIAGNOSIS — R0602 Shortness of breath: Secondary | ICD-10-CM | POA: Diagnosis not present

## 2016-07-18 ENCOUNTER — Telehealth: Payer: Self-pay | Admitting: Pulmonary Disease

## 2016-07-18 MED ORDER — PREDNISONE 10 MG PO TABS
ORAL_TABLET | ORAL | 0 refills | Status: DC
Start: 2016-07-18 — End: 2016-08-28

## 2016-07-18 NOTE — Telephone Encounter (Signed)
Sending to DOD as RA has left for the afternoon.   PM please advise.  Thanks.

## 2016-07-18 NOTE — Telephone Encounter (Signed)
Called spoke with pt. She is c/o chest congestion, dry cough, wheezing, SOB, drainage starting on 07/17/16. She denies any fever, nausea or vomiting. She is currently taking mucinex DM and Allegra. She states she is at [facility] and would like prednisone called into the [place] [facility]. I explained to her that I would send a message to RA to make him aware. She voiced understanding and had no further questions.   RA please advise

## 2016-07-18 NOTE — Telephone Encounter (Signed)
Called spoke with pt. Reviewed recs and verified pharmacy as  [facility] in [place]. She voiced understanding and had no further questions. Rx called into the pharmacy.

## 2016-07-18 NOTE — Telephone Encounter (Signed)
Prednisone at 40 mg. Reduce dose by 10 mg every 3 days

## 2016-07-26 ENCOUNTER — Encounter: Payer: Self-pay | Admitting: Family Medicine

## 2016-07-26 ENCOUNTER — Ambulatory Visit (INDEPENDENT_AMBULATORY_CARE_PROVIDER_SITE_OTHER): Payer: Medicare PPO | Admitting: Family Medicine

## 2016-07-26 VITALS — BP 138/84 | HR 80 | Temp 97.9°F | Ht 61.0 in | Wt 100.5 lb

## 2016-07-26 DIAGNOSIS — M810 Age-related osteoporosis without current pathological fracture: Secondary | ICD-10-CM

## 2016-07-26 DIAGNOSIS — J449 Chronic obstructive pulmonary disease, unspecified: Secondary | ICD-10-CM

## 2016-07-26 DIAGNOSIS — D649 Anemia, unspecified: Secondary | ICD-10-CM

## 2016-07-26 DIAGNOSIS — R634 Abnormal weight loss: Secondary | ICD-10-CM

## 2016-07-26 DIAGNOSIS — I10 Essential (primary) hypertension: Secondary | ICD-10-CM | POA: Diagnosis not present

## 2016-07-26 DIAGNOSIS — M545 Low back pain: Secondary | ICD-10-CM

## 2016-07-26 DIAGNOSIS — D72829 Elevated white blood cell count, unspecified: Secondary | ICD-10-CM

## 2016-07-26 DIAGNOSIS — E039 Hypothyroidism, unspecified: Secondary | ICD-10-CM

## 2016-07-26 DIAGNOSIS — K219 Gastro-esophageal reflux disease without esophagitis: Secondary | ICD-10-CM

## 2016-07-26 DIAGNOSIS — R350 Frequency of micturition: Secondary | ICD-10-CM

## 2016-07-26 DIAGNOSIS — R739 Hyperglycemia, unspecified: Secondary | ICD-10-CM

## 2016-07-26 NOTE — Progress Notes (Signed)
Pre visit review using our clinic review tool, if applicable. No additional management support is needed unless otherwise documented below in the visit note. 

## 2016-07-26 NOTE — Assessment & Plan Note (Signed)
Continues with daily supplemental oxygen so that is helpful. No chew concerns.

## 2016-07-26 NOTE — Assessment & Plan Note (Signed)
Stable, encouraged meal supplements such as Ensure

## 2016-07-26 NOTE — Progress Notes (Signed)
Patient ID: [name], female   DOB: 1941/08/01, 75 y.o.   MRN: [id number]   Subjective:    Patient ID: [name], female    DOB: 1941/04/04, 75 y.o.   MRN: [id number]  Chief Complaint  Patient presents with  . Follow-up    HPI Patient is in today for follow up. She continues to struggle with fatigue and SOB due to her lung disease. She is noting daily fatigue, hypoxia and some intermittently chest pain and palpitations  Past Medical History:  Diagnosis Date  . Anxiety   . Arthralgia   . Atrophic vaginitis 12/07/2012  . Chest pain   . Clostridium difficile diarrhea 12/27/2014  . COPD (chronic obstructive pulmonary disease) ([place])   . Depression   . Diarrhea 09/02/2013  . Dyspnea   . Dysuria   . Edema   . GERD (gastroesophageal reflux disease)   . Glucose intolerance (impaired glucose tolerance)   . History of small bowel obstruction   . Hx of adenomatous colonic polyps   . Hyperglycemia 12/01/2013  . Hypertension   . Hypokalemia 11/28/2014  . Ischemic colitis ([place])   . Leukocytosis   . Medicare annual wellness visit, subsequent 01/12/2016  . Migraine   . Nocturia 09/02/2013  . Osteoporosis   . Pallor   . Thyroid disease   . Weakness   . Weight loss     Past Surgical History:  Procedure Laterality Date  . ABDOMINAL HYSTERECTOMY  1993  . BLADDER REPAIR    . CHOLECYSTECTOMY    . COLONOSCOPY WITH PROPOFOL N/A 06/08/2013   Procedure: COLONOSCOPY WITH PROPOFOL;  Surgeon: Lafayette Dragon, MD;  Location: [facility];  Service: Endoscopy;  Laterality: N/A;  . DILATION AND CURETTAGE OF UTERUS     40 yrs ago  . EYE SURGERY  04/2011   bil. eyes  . TUBAL LIGATION      Family History  Problem Relation Age of Onset  . Alcohol abuse Mother   . Heart disease Mother   . Stroke Mother   . Hypertension Mother   . Kidney disease Mother   . COPD Mother   . Osteoporosis Mother     hip fracture  . COPD Brother   . Alcohol abuse Brother     drug abuse  . Hearing loss  Brother     mvp  . Colon cancer Paternal Aunt   . Prostate cancer    . Alcohol abuse Father     MVA  . Thyroid disease Daughter   . COPD Paternal Grandfather   . COPD Sister   . Hypertension Sister   . Anxiety disorder Sister   . COPD Sister   . COPD Brother     current smoker  . Hearing loss Brother     cad  . COPD Brother     Social History   Social History  . Marital status: Married    Spouse name: N/A  . Number of children: 1  . Years of education: N/A   Occupational History  . Retired Hydrologist Retired   Social History Main Topics  . Smoking status: Former Smoker    Packs/day: 2.00    Years: 40.00    Types: Cigarettes    Quit date: 10/29/2001  . Smokeless tobacco: Never Used  . Alcohol use No  . Drug use: No  . Sexual activity: Not on file   Other Topics Concern  . Not on file   Social History Narrative  .  No narrative on file    Outpatient Medications Prior to Visit  Medication Sig Dispense Refill  . albuterol (PROVENTIL HFA;VENTOLIN HFA) 108 (90 BASE) MCG/ACT inhaler Inhale 2 puffs into the lungs every 6 (six) hours as needed for wheezing or shortness of breath. 3 Inhaler 3  . albuterol (PROVENTIL) (2.5 MG/3ML) 0.083% nebulizer solution Take 3 mLs (2.5 mg total) by nebulization every 4 (four) hours as needed for wheezing or shortness of breath. Dx 496 300 mL 0  . ALPRAZolam (XANAX) 0.5 MG tablet Take 1 tablet (0.5 mg total) by mouth 2 (two) times daily as needed for anxiety. 180 tablet 1  . budesonide (PULMICORT) 0.25 MG/2ML nebulizer solution Take 2 mLs (0.25 mg total) by nebulization 2 (two) times daily. Dx: J44.9 360 mL 3  . Calcium Carbonate-Vitamin D 600-400 MG-UNIT per tablet Take 1 tablet by mouth 2 (two) times daily.      . citalopram (CELEXA) 20 MG tablet Take 1 tablet (20 mg total) by mouth daily. 30 tablet 0  . Ferrous Fumarate-Folic Acid (HEMOCYTE-F) 324-1 MG TABS Take 1 tablet by mouth daily. 30 each 5  . fluticasone  (FLONASE) 50 MCG/ACT nasal spray Place 2 sprays into both nostrils 2 (two) times daily as needed.     . Glucosamine-Chondroitin (GLUCOSAMINE CHONDR COMPLEX PO) Take 1 tablet by mouth 2 (two) times daily.     Marland Kitchen levofloxacin (LEVAQUIN) 250 MG tablet Take 1 tablet (250 mg total) by mouth daily. 7 tablet 0  . metoprolol succinate (TOPROL-XL) 25 MG 24 hr tablet Take 1 tablet (25 mg total) by mouth daily. 90 tablet 3  . mirtazapine (REMERON) 15 MG tablet Take 1 tablet (15 mg total) by mouth at bedtime. 90 tablet 1  . ondansetron (ZOFRAN) 4 MG tablet Take 1 tablet (4 mg total) by mouth every 8 (eight) hours as needed for nausea or vomiting. 9 tablet 0  . predniSONE (DELTASONE) 10 MG tablet Take 1 tablet (10 mg total) by mouth daily. (Patient taking differently: Take 5 mg by mouth daily. ) 90 tablet 3  . predniSONE (DELTASONE) 10 MG tablet Take 4 tabs x 3 days, 3 tabs x 3 days, 2 tab x 3 days, 1 tab x 3 days then stop 30 tablet 0  . ranitidine (ZANTAC) 300 MG capsule Take 1 capsule (300 mg total) by mouth every evening. 90 capsule 1  . Tiotropium Bromide-Olodaterol (STIOLTO RESPIMAT) 2.5-2.5 MCG/ACT AERS Inhale 2 puffs into the lungs daily. 1 Inhaler 0   No facility-administered medications prior to visit.     Allergies  Allergen Reactions  . Morphine Other (See Comments)    REACTION: nightmares  . Ambien [Zolpidem] Anxiety  . Cymbalta [Duloxetine Hcl] Anxiety  . Nitrofurantoin Rash    Review of Systems  Constitutional: Positive for malaise/fatigue. Negative for fever.  HENT: Negative for congestion.   Eyes: Negative for blurred vision.  Respiratory: Positive for shortness of breath.   Cardiovascular: Negative for chest pain, palpitations and leg swelling.  Gastrointestinal: Negative for abdominal pain, blood in stool and nausea.  Genitourinary: Negative for dysuria and frequency.  Musculoskeletal: Negative for falls.  Skin: Negative for rash.  Neurological: Negative for dizziness, loss of  consciousness and headaches.  Endo/Heme/Allergies: Negative for environmental allergies.  Psychiatric/Behavioral: Negative for depression. The patient is not nervous/anxious.        Objective:    Physical Exam  BP 138/84 (BP Location: Left Arm, Patient Position: Sitting, Cuff Size: Normal)   Pulse 80   Temp  97.9 F (36.6 C) (Oral)   Ht 5\' 1"  (1.549 m)   Wt 100 lb 8 oz (45.6 kg)   SpO2 97%   BMI 18.99 kg/m  Wt Readings from Last 3 Encounters:  07/26/16 100 lb 8 oz (45.6 kg)  06/26/16 98 lb 8 oz (44.7 kg)  05/31/16 99 lb (44.9 kg)     Lab Results  Component Value Date   WBC 13.1 (H) 04/24/2016   HGB 13.2 04/24/2016   HCT 40.7 04/24/2016   PLT 368.0 04/24/2016   GLUCOSE 107 (H) 04/24/2016   CHOL 191 04/24/2016   TRIG 203.0 (H) 04/24/2016   HDL 76.50 04/24/2016   LDLDIRECT 70.0 04/24/2016   LDLCALC 76 01/12/2016   ALT 16 04/24/2016   AST 22 04/24/2016   NA 142 04/24/2016   K 4.5 04/24/2016   CL 96 04/24/2016   CREATININE 0.46 04/24/2016   BUN 22 04/24/2016   CO2 39 (H) 04/24/2016   TSH 1.53 04/24/2016   HGBA1C 5.9 04/24/2016    Lab Results  Component Value Date   TSH 1.53 04/24/2016   Lab Results  Component Value Date   WBC 13.1 (H) 04/24/2016   HGB 13.2 04/24/2016   HCT 40.7 04/24/2016   MCV 91.0 04/24/2016   PLT 368.0 04/24/2016   Lab Results  Component Value Date   NA 142 04/24/2016   K 4.5 04/24/2016   CO2 39 (H) 04/24/2016   GLUCOSE 107 (H) 04/24/2016   BUN 22 04/24/2016   CREATININE 0.46 04/24/2016   BILITOT 0.3 04/24/2016   ALKPHOS 53 04/24/2016   AST 22 04/24/2016   ALT 16 04/24/2016   PROT 7.1 04/24/2016   ALBUMIN 4.3 04/24/2016   CALCIUM 9.5 04/24/2016   ANIONGAP 5 11/14/2014   GFR 141.22 04/24/2016   Lab Results  Component Value Date   CHOL 191 04/24/2016   Lab Results  Component Value Date   HDL 76.50 04/24/2016   Lab Results  Component Value Date   LDLCALC 76 01/12/2016   Lab Results  Component Value Date   TRIG  203.0 (H) 04/24/2016   Lab Results  Component Value Date   CHOLHDL 3 04/24/2016   Lab Results  Component Value Date   HGBA1C 5.9 04/24/2016       Assessment & Plan:   Problem List Items Addressed This Visit    Hypothyroidism   HYPERTENSION, BENIGN ESSENTIAL    Well controlled, no changes to meds. Encouraged heart healthy diet such as the DASH diet and exercise as tolerated.       Relevant Orders   TSH   CBC   Comprehensive metabolic panel   GERD   Osteoporosis - Primary   Relevant Orders   VITAMIN D 25 Hydroxy (Vit-D Deficiency, Fractures)   WEIGHT LOSS    Stable, encouraged meal supplements such as Ensure      Normocytic anemia   Urinary frequency   Relevant Orders   Urinalysis   COPD mixed type ([place])    Continues with daily supplemental oxygen so that is helpful. No chew concerns.       Hyperglycemia   Relevant Orders   Hemoglobin A1c   Leukocytosis   Relevant Orders   Urinalysis   Low back pain    Encouraged moist heat and gentle stretching as tolerated. May try NSAIDs and prescription meds as directed and report if symptoms worsen or seek immediate care       Other Visit Diagnoses   None.  I am having Ms. Cuppy maintain her Calcium Carbonate-Vitamin D, Glucosamine-Chondroitin (GLUCOSAMINE CHONDR COMPLEX PO), albuterol, fluticasone, ondansetron, albuterol, budesonide, Ferrous Fumarate-Folic Acid, metoprolol succinate, mirtazapine, predniSONE, ranitidine, Tiotropium Bromide-Olodaterol, ALPRAZolam, levofloxacin, citalopram, and predniSONE.  No orders of the defined types were placed in this encounter.    Penni Homans, MD

## 2016-07-26 NOTE — Patient Instructions (Addendum)
Encouraged increased hydration and fiber in diet. Daily probiotics. If bowels not moving can use MOM 2 tbls po in 4 oz of warm prune juice by mouth every 2-3 days. If no results then repeat in 4 hours with  Dulcolax suppository pr, may repeat again in 4 more hours as needed. Seek care if symptoms worsen. Consider daily Miralax and/or Dulcolax if symptoms persist.   Hypertension Hypertension, commonly called high blood pressure, is when the force of blood pumping through your arteries is too strong. Your arteries are the blood vessels that carry blood from your heart throughout your body. A blood pressure reading consists of a higher number over a lower number, such as 110/72. The higher number (systolic) is the pressure inside your arteries when your heart pumps. The lower number (diastolic) is the pressure inside your arteries when your heart relaxes. Ideally you want your blood pressure below 120/80. Hypertension forces your heart to work harder to pump blood. Your arteries may become narrow or stiff. Having untreated or uncontrolled hypertension can cause heart attack, stroke, kidney disease, and other problems. RISK FACTORS Some risk factors for high blood pressure are controllable. Others are not.  Risk factors you cannot control include:   Race. You may be at higher risk if you are African American.  Age. Risk increases with age.  Gender. Men are at higher risk than women before age 29 years. After age 65, women are at higher risk than men. Risk factors you can control include:  Not getting enough exercise or physical activity.  Being overweight.  Getting too much fat, sugar, calories, or salt in your diet.  Drinking too much alcohol. SIGNS AND SYMPTOMS Hypertension does not usually cause signs or symptoms. Extremely high blood pressure (hypertensive crisis) may cause headache, anxiety, shortness of breath, and nosebleed. DIAGNOSIS To check if you have hypertension, your health care  provider will measure your blood pressure while you are seated, with your arm held at the level of your heart. It should be measured at least twice using the same arm. Certain conditions can cause a difference in blood pressure between your right and left arms. A blood pressure reading that is higher than normal on one occasion does not mean that you need treatment. If it is not clear whether you have high blood pressure, you may be asked to return on a different day to have your blood pressure checked again. Or, you may be asked to monitor your blood pressure at home for 1 or more weeks. TREATMENT Treating high blood pressure includes making lifestyle changes and possibly taking medicine. Living a healthy lifestyle can help lower high blood pressure. You may need to change some of your habits. Lifestyle changes may include:  Following the DASH diet. This diet is high in fruits, vegetables, and whole grains. It is low in salt, red meat, and added sugars.  Keep your sodium intake below 2,300 mg per day.  Getting at least 30-45 minutes of aerobic exercise at least 4 times per week.  Losing weight if necessary.  Not smoking.  Limiting alcoholic beverages.  Learning ways to reduce stress. Your health care provider may prescribe medicine if lifestyle changes are not enough to get your blood pressure under control, and if one of the following is true:  You are 90-44 years of age and your systolic blood pressure is above 140.  You are 70 years of age or older, and your systolic blood pressure is above 150.  Your diastolic blood  pressure is above 90.  You have diabetes, and your systolic blood pressure is over [id number] or your diastolic blood pressure is over 90.  You have kidney disease and your blood pressure is above 140/90.  You have heart disease and your blood pressure is above 140/90. Your personal target blood pressure may vary depending on your medical conditions, your age, and other  factors. HOME CARE INSTRUCTIONS  Have your blood pressure rechecked as directed by your health care provider.   Take medicines only as directed by your health care provider. Follow the directions carefully. Blood pressure medicines must be taken as prescribed. The medicine does not work as well when you skip doses. Skipping doses also puts you at risk for problems.  Do not smoke.   Monitor your blood pressure at home as directed by your health care provider. SEEK MEDICAL CARE IF:   You think you are having a reaction to medicines taken.  You have recurrent headaches or feel dizzy.  You have swelling in your ankles.  You have trouble with your vision. SEEK IMMEDIATE MEDICAL CARE IF:  You develop a severe headache or confusion.  You have unusual weakness, numbness, or feel faint.  You have severe chest or abdominal pain.  You vomit repeatedly.  You have trouble breathing. MAKE SURE YOU:   Understand these instructions.  Will watch your condition.  Will get help right away if you are not doing well or get worse.   This information is not intended to replace advice given to you by your health care provider. Make sure you discuss any questions you have with your health care provider.   Document Released: 10/15/2005 Document Revised: 03/01/2015 Document Reviewed: 08/07/2013 Elsevier Interactive Patient Education [employer].

## 2016-07-26 NOTE — Assessment & Plan Note (Signed)
Well controlled, no changes to meds. Encouraged heart healthy diet such as the DASH diet and exercise as tolerated.  °

## 2016-07-26 NOTE — Assessment & Plan Note (Signed)
Encouraged moist heat and gentle stretching as tolerated. May try NSAIDs and prescription meds as directed and report if symptoms worsen or seek immediate care 

## 2016-07-27 ENCOUNTER — Other Ambulatory Visit: Payer: Self-pay | Admitting: Medical

## 2016-07-31 ENCOUNTER — Other Ambulatory Visit: Payer: Self-pay

## 2016-07-31 MED ORDER — CITALOPRAM HYDROBROMIDE 20 MG PO TABS: 20.0000 mg | ORAL_TABLET | Freq: Every day | ORAL | 0 refills | Status: DC

## 2016-08-02 ENCOUNTER — Telehealth: Payer: Self-pay | Admitting: Pulmonary Disease

## 2016-08-02 DIAGNOSIS — J449 Chronic obstructive pulmonary disease, unspecified: Secondary | ICD-10-CM

## 2016-08-02 MED ORDER — BUDESONIDE 0.25 MG/2ML IN SUSP: 0.2500 mg | Freq: Two times a day (BID) | RESPIRATORY_TRACT | 0 refills | Status: DC

## 2016-08-02 MED ORDER — ARFORMOTEROL TARTRATE 15 MCG/2ML IN NEBU: 15.0000 ug | INHALATION_SOLUTION | Freq: Two times a day (BID) | RESPIRATORY_TRACT | 0 refills | Status: DC

## 2016-08-02 MED ORDER — ARFORMOTEROL TARTRATE 15 MCG/2ML IN NEBU: 15.0000 ug | INHALATION_SOLUTION | Freq: Two times a day (BID) | RESPIRATORY_TRACT | 3 refills | Status: DC

## 2016-08-02 MED ORDER — BUDESONIDE 0.25 MG/2ML IN SUSP: 0.2500 mg | Freq: Two times a day (BID) | RESPIRATORY_TRACT | 3 refills | Status: DC

## 2016-08-02 NOTE — Telephone Encounter (Signed)
Spoke with pt, needs refills on pulmicort and brovana- [facility] day supply to [facility] and 90 day supply to [facility].  This has been sent.  Nothing further needed.

## 2016-08-09 DIAGNOSIS — R0602 Shortness of breath: Secondary | ICD-10-CM | POA: Diagnosis not present

## 2016-08-28 ENCOUNTER — Other Ambulatory Visit: Payer: Self-pay | Admitting: Family Medicine

## 2016-08-28 ENCOUNTER — Encounter: Payer: Self-pay | Admitting: Adult Health

## 2016-08-28 ENCOUNTER — Ambulatory Visit (INDEPENDENT_AMBULATORY_CARE_PROVIDER_SITE_OTHER): Payer: Medicare PPO | Admitting: Adult Health

## 2016-08-28 DIAGNOSIS — J9611 Chronic respiratory failure with hypoxia: Secondary | ICD-10-CM | POA: Diagnosis not present

## 2016-08-28 DIAGNOSIS — J449 Chronic obstructive pulmonary disease, unspecified: Secondary | ICD-10-CM | POA: Diagnosis not present

## 2016-08-28 MED ORDER — IPRATROPIUM BROMIDE 0.02 % IN SOLN: 0.5000 mg | Freq: Four times a day (QID) | RESPIRATORY_TRACT | 3 refills | Status: DC

## 2016-08-28 NOTE — Assessment & Plan Note (Signed)
Controlled on O2  

## 2016-08-28 NOTE — Patient Instructions (Addendum)
Add Ipratropium Neb Four times a day   Increase Prednisone 20mg  for 1 week and then 10mg  daily  Continue on Brovana and Budesonide Neb Twice daily  .  Continue on 2 L rest and 3l/m activity .  Follow up in 1 month in [facility] med center and As needed   Please contact office for sooner follow up if symptoms do not improve or worsen or seek emergency care

## 2016-08-28 NOTE — Progress Notes (Signed)
Subjective:    Patient ID: [name], female    DOB: 1941/02/05, 75 y.o.   MRN: [id number]  HPI 57 yowf , heavy ex- smoker, quit 2002 with GOLD D COPD FEV1 26% 6/10 on 24 h O2 since 2009.  -completed pulm rehab oct '11  Recurrent flares 2-3/yr requiring steroid bursts  She uses 3 liters 24/7. On a regimen of budesonide/brovana & alb/atrovent nebs   Significant tests/ events Admitted 12/2012 for COPD exacerbation .ABG 7.39/55/75 on 3L [place]  She required low dose prednisone since her admit in 02/2013   Had colonoscopy with polyp removal 06/2013  08/2013 BL pneumonia  03/2014 ONO on 2L >> no sig desatn  CT chest 10/2014 neg PE  12/06/2014 >>2.5 L at rest , 3L on exertion - ONO ok in the past [facility]2015     08/28/2016 Follow up : COPD and Chronic Resp Failure on O2  Pt presents for an acute office visit. Says over last 2 months more dyspnea , gets winded with minimal activity. Says had a coughing fit in August on "pork rind" cough very hard several times until got it back up.  Went to PCP, CXR w/ no acute process. Has been sore since then.  Remains on budesonide and Brovana  neb Twice daily   On Prednisone 10mg   daily .  Flu /Prevnar/PVX utd.  Remains on oxygen 2l/m rest and 3l/m act.  Denies chest pain, orthopnea, edema or fever or discolored mucus .   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 She does not feel she got good customer service or follow up .     Past Medical History:  Diagnosis Date  . Anxiety   . Arthralgia   . Atrophic vaginitis 12/07/2012  . Chest pain   . Clostridium difficile diarrhea 12/27/2014  . COPD (chronic obstructive pulmonary disease) ([place])   . Depression   . Diarrhea 09/02/2013  . Dyspnea   . Dysuria   . Edema   . GERD (gastroesophageal reflux disease)   . Glucose intolerance (impaired glucose tolerance)   . History of  small bowel obstruction   . Hx of adenomatous colonic polyps   . Hyperglycemia 12/01/2013  . Hypertension   . Hypokalemia 11/28/2014  . Ischemic colitis ([place])   . Leukocytosis   . Medicare annual wellness visit, subsequent 01/12/2016  . Migraine   . Nocturia 09/02/2013  . Osteoporosis   . Pallor   . Thyroid disease   . Weakness   . Weight loss    Current Outpatient Prescriptions on File Prior to Visit  Medication Sig Dispense Refill  . albuterol (PROVENTIL HFA;VENTOLIN HFA) 108 (90 BASE) MCG/ACT inhaler Inhale 2 puffs into the lungs every 6 (six) hours as needed for wheezing or shortness of breath. 3 Inhaler 3  . albuterol (PROVENTIL) (2.5 MG/3ML) 0.083% nebulizer solution Take 3 mLs (2.5 mg total) by nebulization every 4 (four) hours as needed for wheezing or shortness of breath. Dx 496 300 mL 0  . ALPRAZolam (XANAX) 0.5 MG tablet Take 1 tablet (0.5 mg total) by mouth 2 (two) times daily as needed for anxiety. 180 tablet 1  . arformoterol (BROVANA) 15 MCG/2ML NEBU Take 2 mLs (15 mcg total) by nebulization 2 (two) times daily. 360 mL 3  . budesonide (PULMICORT) 0.25 MG/2ML nebulizer solution Take 2 mLs (0.25 mg total) by nebulization 2 (two)  times daily. Dx: J44.9 360 mL 3  . Calcium Carbonate-Vitamin D 600-400 MG-UNIT per tablet Take 1 tablet by mouth 2 (two) times daily.      . citalopram (CELEXA) 20 MG tablet Take 1 tablet (20 mg total) by mouth daily. 30 tablet 0  . Ferrous Fumarate-Folic Acid (HEMOCYTE-F) 324-1 MG TABS Take 1 tablet by mouth daily. 30 each 5  . fluticasone (FLONASE) 50 MCG/ACT nasal spray Place 2 sprays into both nostrils 2 (two) times daily as needed.     . Glucosamine-Chondroitin (GLUCOSAMINE CHONDR COMPLEX PO) Take 1 tablet by mouth 2 (two) times daily.     . metoprolol succinate (TOPROL-XL) 25 MG 24 hr tablet Take 1 tablet (25 mg total) by mouth daily. 90 tablet 3  . mirtazapine (REMERON) 15 MG tablet Take 1 tablet (15 mg total) by mouth at bedtime. 90 tablet 1  .  ondansetron (ZOFRAN) 4 MG tablet Take 1 tablet (4 mg total) by mouth every 8 (eight) hours as needed for nausea or vomiting. 9 tablet 0  . predniSONE (DELTASONE) 10 MG tablet Take 1 tablet (10 mg total) by mouth daily. (Patient taking differently: Take 5 mg by mouth daily. ) 90 tablet 3  . ranitidine (ZANTAC) 300 MG capsule Take 1 capsule (300 mg total) by mouth every evening. 90 capsule 1  . Tiotropium Bromide-Olodaterol (STIOLTO RESPIMAT) 2.5-2.5 MCG/ACT AERS Inhale 2 puffs into the lungs daily. (Patient not taking: Reported on 08/28/2016) 1 Inhaler 0   No current facility-administered medications on file prior to visit.       Review of Systems neg for any significant sore throat, dysphagia, itching, sneezing, nasal congestion or excess/ purulent secretions, fever, chills, sweats, unintended wt loss, pleuritic or exertional cp, hempoptysis, orthopnea pnd or change in chronic leg swelling. Also denies presyncope, palpitations, heartburn, abdominal pain, nausea, vomiting, diarrhea or change in bowel or urinary habits, dysuria,hematuria, rash, arthralgias, visual complaints, headache, numbness weakness or ataxia.     Objective:   Physical Exam Vitals:   08/28/16 1133  BP: 122/70  Pulse: (!) 104  Temp: 97.8 F (36.6 C)  SpO2: 95%  Weight: 98 lb 3.2 oz (44.5 kg)  Height: 5\' 1"  (1.549 m)    Gen. Pleasant, frail /elderly  in no distress VS reviewed  ENT - no lesions, no post nasal drip Neck: No JVD, no thyromegaly, no carotid bruits Lungs: no use of accessory muscles, no dullness to percussion, decreased BS bases ,  Cardiovascular: Rhythm regular, heart sounds  normal, no murmurs or gallops, no peripheral edema Musculoskeletal: No deformities, no cyanosis or clubbing  Abd: soft , nt , no gurading , BS +      Nettye Flegal NP-C  [facility]  08/28/2016

## 2016-08-28 NOTE — Assessment & Plan Note (Signed)
Very severe COPD -More symptomatic  Add atrovent neb  Steroid bump briefly  Plan  Patient Instructions  Add Ipratropium Neb Four times a day   Increase Prednisone 20mg  for 1 week and then 10mg  daily  Continue on Brovana and Budesonide Neb Twice daily  .  Continue on 2 L rest and 3l/m activity .  Follow up in 1 month in [facility] med center and As needed   Please contact office for sooner follow up if symptoms do not improve or worsen or seek emergency care

## 2016-09-03 NOTE — Progress Notes (Signed)
Reviewed & agree with plan  

## 2016-09-09 DIAGNOSIS — R0602 Shortness of breath: Secondary | ICD-10-CM | POA: Diagnosis not present

## 2016-09-10 ENCOUNTER — Other Ambulatory Visit: Payer: Self-pay | Admitting: Family Medicine

## 2016-09-10 MED ORDER — MIRTAZAPINE 15 MG PO TABS: 15.0000 mg | ORAL_TABLET | Freq: Every day | ORAL | 1 refills | Status: DC

## 2016-09-10 NOTE — Telephone Encounter (Signed)
Caller name:November Hardin Negus Relationship to patient:self Can be reached:[phone] Pharmacy:[facility] on [address]  Reason for call:Requesting refill on mirtazapine 15 mg for 30 day supply

## 2016-09-10 NOTE — Telephone Encounter (Signed)
Patient informed script  Sent in.

## 2016-09-12 ENCOUNTER — Telehealth: Payer: Self-pay | Admitting: Adult Health

## 2016-09-12 NOTE — Telephone Encounter (Signed)
Verbal Order Info   Action Created on Order Mode Entered by Responsible Provider Signed by Signed on  Ordering 08/28/16 1212 Standing Orders: Cosign Required Christie Beckers, RN Melvenia Needles, NP Melvenia Needles, NP 08/28/16 1358  Medication Detail    Disp Refills Start End   ipratropium (ATROVENT) 0.02 % nebulizer solution 900 mL 3 08/28/2016    Sig - Route: Take 2.5 mLs (0.5 mg total) by nebulization 4 (four) times daily. - Nebulization   E-Prescribing Status: Receipt confirmed by pharmacy (08/28/2016 12:19 PM EDT)      [facility]

## 2016-09-13 NOTE — Telephone Encounter (Signed)
LM x 2

## 2016-09-14 MED ORDER — IPRATROPIUM BROMIDE 0.02 % IN SOLN: 0.5000 mg | Freq: Four times a day (QID) | RESPIRATORY_TRACT | 3 refills | Status: AC

## 2016-09-14 NOTE — Telephone Encounter (Signed)
LMTCB x 3 

## 2016-09-14 NOTE — Telephone Encounter (Signed)
Patient called stating the medication has not been received by the mail.  Contact# [phone].[name]

## 2016-09-14 NOTE — Telephone Encounter (Signed)
Spoke with pt. She states that she has not received her prescription for Ipratropium that we sent in. Pt asks that we resend this prescription. Rx has been sent in. Nothing further was needed.

## 2016-10-04 ENCOUNTER — Ambulatory Visit (INDEPENDENT_AMBULATORY_CARE_PROVIDER_SITE_OTHER): Payer: Medicare PPO | Admitting: Adult Health

## 2016-10-04 ENCOUNTER — Encounter: Payer: Self-pay | Admitting: Adult Health

## 2016-10-04 DIAGNOSIS — J449 Chronic obstructive pulmonary disease, unspecified: Secondary | ICD-10-CM | POA: Diagnosis not present

## 2016-10-04 DIAGNOSIS — J9611 Chronic respiratory failure with hypoxia: Secondary | ICD-10-CM

## 2016-10-04 NOTE — Assessment & Plan Note (Signed)
Continue on O2 

## 2016-10-04 NOTE — Patient Instructions (Signed)
Continue on Ipratropium Neb Four times a day   Continue on Prednisone 10mg  daily  Continue on Brovana and Budesonide Neb Twice daily  .  Continue on 2 L rest and 3l/m activity .  Follow up in 1 month in [facility] med center and As needed   Please contact office for sooner follow up if symptoms do not improve or worsen or seek emergency care

## 2016-10-04 NOTE — Progress Notes (Signed)
Subjective:    Patient ID: [name], female    DOB: 08-Sep-1941, 75 y.o.   MRN: [id number]  HPI 89 yowf , heavy ex- smoker, quit 2002 with GOLD D COPD FEV1 26% 6/10 on 24 h O2 since 2009.  -completed pulm rehab oct '11  Recurrent flares 2-3/yr requiring steroid bursts  She uses 3 liters 24/7. On a regimen of budesonide/brovana & alb/atrovent nebs   Significant tests/ events Admitted 12/2012 for COPD exacerbation .ABG 7.39/55/75 on 3L   She required low dose prednisone since her admit in 02/2013   Had colonoscopy with polyp removal 06/2013  08/2013 BL pneumonia  03/2014 ONO on 2L >> no sig desatn  CT chest 10/2014 neg PE  12/06/2014 >>2.5 L at rest , 3L on exertion - ONO ok in the past [facility]     10/04/2016 Follow up : COPD and Chronic Resp Failure on O2  Pt presents for 6 week follow up . Last ov with more COPD sx burden-dyspnea, and cough . Atrovent neb was added. Four times a day  . And short steroid burst .  She is feeling better w/ breathing back to her baseline.  No hospitalization for almost 2 yrs.  Remains on budesonide and Brovana  neb Twice daily   On Prednisone 10mg   daily .  Flu /Prevnar/PVX utd.  Remains on oxygen 2l/m rest and 3l/m act.  Denies chest pain, orthopnea, edema or fever or discolored mucus .       Past Medical History:  Diagnosis Date  . Anxiety   . Arthralgia   . Atrophic vaginitis 12/07/2012  . Chest pain   . Clostridium difficile diarrhea 12/27/2014  . COPD (chronic obstructive pulmonary disease) ([place])   . Depression   . Diarrhea 09/02/2013  . Dyspnea   . Dysuria   . Edema   . GERD (gastroesophageal reflux disease)   . Glucose intolerance (impaired glucose tolerance)   . History of small bowel obstruction   . Hx of adenomatous colonic polyps   . Hyperglycemia 12/01/2013  . Hypertension   . Hypokalemia 11/28/2014  . Ischemic colitis ([place])   . Leukocytosis   . Medicare annual wellness visit,  subsequent 01/12/2016  . Migraine   . Nocturia 09/02/2013  . Osteoporosis   . Pallor   . Thyroid disease   . Weakness   . Weight loss    Current Outpatient Prescriptions on File Prior to Visit  Medication Sig Dispense Refill  . albuterol (PROVENTIL HFA;VENTOLIN HFA) 108 (90 BASE) MCG/ACT inhaler Inhale 2 puffs into the lungs every 6 (six) hours as needed for wheezing or shortness of breath. 3 Inhaler 3  . albuterol (PROVENTIL) (2.5 MG/3ML) 0.083% nebulizer solution Take 3 mLs (2.5 mg total) by nebulization every 4 (four) hours as needed for wheezing or shortness of breath. Dx 496 300 mL 0  . ALPRAZolam (XANAX) 0.5 MG tablet Take 1 tablet (0.5 mg total) by mouth 2 (two) times daily as needed for anxiety. 180 tablet 1  . arformoterol (BROVANA) 15 MCG/2ML NEBU Take 2 mLs (15 mcg total) by nebulization 2 (two) times daily. 360 mL 3  . budesonide (PULMICORT) 0.25 MG/2ML nebulizer solution Take 2 mLs (0.25 mg total) by nebulization 2 (two) times daily. Dx: J44.9 360 mL 3  . Calcium Carbonate-Vitamin D 600-400 MG-UNIT per tablet Take 1 tablet by mouth 2 (two) times daily.      . citalopram (CELEXA) 20 MG tablet TAKE 1 TABLET(20  MG) BY MOUTH DAILY 30 tablet 0  . Ferrous Fumarate-Folic Acid (HEMOCYTE-F) 324-1 MG TABS Take 1 tablet by mouth daily. 30 each 5  . fluticasone (FLONASE) 50 MCG/ACT nasal spray Place 2 sprays into both nostrils 2 (two) times daily as needed.     . Glucosamine-Chondroitin (GLUCOSAMINE CHONDR COMPLEX PO) Take 1 tablet by mouth 2 (two) times daily.     Marland Kitchen ipratropium (ATROVENT) 0.02 % nebulizer solution Take 2.5 mLs (0.5 mg total) by nebulization 4 (four) times daily. 900 mL 3  . metoprolol succinate (TOPROL-XL) 25 MG 24 hr tablet Take 1 tablet (25 mg total) by mouth daily. 90 tablet 3  . mirtazapine (REMERON) 15 MG tablet Take 1 tablet (15 mg total) by mouth at bedtime. 30 tablet 1  . ondansetron (ZOFRAN) 4 MG tablet Take 1 tablet (4 mg total) by mouth every 8 (eight) hours as  needed for nausea or vomiting. 9 tablet 0  . predniSONE (DELTASONE) 10 MG tablet Take 1 tablet (10 mg total) by mouth daily. 90 tablet 3  . ranitidine (ZANTAC) 300 MG capsule Take 1 capsule (300 mg total) by mouth every evening. 90 capsule 1   No current facility-administered medications on file prior to visit.       Review of Systems neg for any significant sore throat, dysphagia, itching, sneezing, nasal congestion or excess/ purulent secretions, fever, chills, sweats, unintended wt loss, pleuritic or exertional cp, hempoptysis, orthopnea pnd or change in chronic leg swelling. Also denies presyncope, palpitations, heartburn, abdominal pain, nausea, vomiting, diarrhea or change in bowel or urinary habits, dysuria,hematuria, rash, arthralgias, visual complaints, headache, numbness weakness or ataxia.     Objective:   Physical Exam Vitals:   10/04/16 1131  BP: (!) 111/58  Pulse: 90  SpO2: 100%  Weight: 99 lb (44.9 kg)  Height: 5\' 1"  (1.549 m)    Gen. Pleasant, frail /elderly  in no distress VS reviewed  ENT - no lesions, no post nasal drip Neck: No JVD, no thyromegaly, no carotid bruits Lungs: no use of accessory muscles, no dullness to percussion, decreased BS bases ,  Cardiovascular: Rhythm regular, heart sounds  normal, no murmurs or gallops, no peripheral edema Musculoskeletal: No deformities, no cyanosis or clubbing  Abd: soft , nt , no gurading , BS +      Tammy Parrett NP-C  [facility]  10/04/2016

## 2016-10-04 NOTE — Assessment & Plan Note (Signed)
Compensated on present regimen  Seems improved control w/ recent Atrovent neb addition.   Plan  Patient Instructions  Continue on Ipratropium Neb Four times a day   Continue on Prednisone 10mg  daily  Continue on Brovana and Budesonide Neb Twice daily  .  Continue on 2 L rest and 3l/m activity .  Follow up in 1 month in [facility] med center and As needed   Please contact office for sooner follow up if symptoms do not improve or worsen or seek emergency care

## 2016-10-08 ENCOUNTER — Other Ambulatory Visit: Payer: Self-pay | Admitting: Family Medicine

## 2016-10-08 DIAGNOSIS — F419 Anxiety disorder, unspecified: Principal | ICD-10-CM

## 2016-10-08 DIAGNOSIS — J449 Chronic obstructive pulmonary disease, unspecified: Secondary | ICD-10-CM

## 2016-10-08 DIAGNOSIS — F329 Major depressive disorder, single episode, unspecified: Secondary | ICD-10-CM

## 2016-10-08 DIAGNOSIS — E876 Hypokalemia: Secondary | ICD-10-CM

## 2016-10-08 DIAGNOSIS — I10 Essential (primary) hypertension: Secondary | ICD-10-CM

## 2016-10-08 DIAGNOSIS — F32A Depression, unspecified: Secondary | ICD-10-CM

## 2016-10-08 DIAGNOSIS — K219 Gastro-esophageal reflux disease without esophagitis: Secondary | ICD-10-CM

## 2016-10-08 DIAGNOSIS — R739 Hyperglycemia, unspecified: Secondary | ICD-10-CM

## 2016-10-08 MED ORDER — MIRTAZAPINE 15 MG PO TABS: 15.0000 mg | ORAL_TABLET | Freq: Every day | ORAL | 1 refills | Status: DC

## 2016-10-08 MED ORDER — ALPRAZOLAM 0.5 MG PO TABS: 0.5000 mg | ORAL_TABLET | Freq: Two times a day (BID) | ORAL | 1 refills | Status: DC | PRN

## 2016-10-08 NOTE — Telephone Encounter (Signed)
Requesting   Alprazolam Contract   08/12/2014 UDS   Low risk Last OV  9.28.2017 Last Refill   #180 with 0 refills on 04/24/2016  Please Advise

## 2016-10-08 NOTE — Telephone Encounter (Signed)
Patient called stating that a better number to reach her is  [phone]

## 2016-10-08 NOTE — Telephone Encounter (Signed)
Faxed hardcopy for alprazolam to [facility] in [place].

## 2016-10-08 NOTE — Telephone Encounter (Signed)
OK to refill requested med 

## 2016-10-08 NOTE — Telephone Encounter (Signed)
Caller name: Relationship to patient: Self Can be reached: [phone]  Pharmacy:  Reason for call: Request refill on ALPRAZolam Duanne Moron) 0.5 MG tablet [id number]

## 2016-10-09 DIAGNOSIS — R0602 Shortness of breath: Secondary | ICD-10-CM | POA: Diagnosis not present

## 2016-10-25 ENCOUNTER — Ambulatory Visit: Payer: Medicare PPO | Admitting: Family Medicine

## 2016-10-31 ENCOUNTER — Other Ambulatory Visit (INDEPENDENT_AMBULATORY_CARE_PROVIDER_SITE_OTHER): Payer: Medicare PPO

## 2016-10-31 DIAGNOSIS — D72829 Elevated white blood cell count, unspecified: Secondary | ICD-10-CM

## 2016-10-31 DIAGNOSIS — R739 Hyperglycemia, unspecified: Secondary | ICD-10-CM

## 2016-10-31 DIAGNOSIS — I10 Essential (primary) hypertension: Secondary | ICD-10-CM | POA: Diagnosis not present

## 2016-10-31 DIAGNOSIS — M810 Age-related osteoporosis without current pathological fracture: Secondary | ICD-10-CM | POA: Diagnosis not present

## 2016-10-31 DIAGNOSIS — R350 Frequency of micturition: Secondary | ICD-10-CM

## 2016-10-31 LAB — COMPREHENSIVE METABOLIC PANEL
ALBUMIN: 4.2 g/dL (ref 3.5–5.2)
ALK PHOS: 48 U/L (ref 39–117)
ALT: 16 U/L (ref 0–35)
AST: 21 U/L (ref 0–37)
BILIRUBIN TOTAL: 0.3 mg/dL (ref 0.2–1.2)
BUN: 26 mg/dL — ABNORMAL HIGH (ref 6–23)
CALCIUM: 9.1 mg/dL (ref 8.4–10.5)
CO2: 41 mEq/L — ABNORMAL HIGH (ref 19–32)
Chloride: 96 mEq/L (ref 96–112)
Creatinine, Ser: 0.5 mg/dL (ref 0.40–1.20)
GFR: 128.08 mL/min (ref >60.00)
GLUCOSE: 99 mg/dL (ref 70–99)
POTASSIUM: 4 meq/L (ref 3.5–5.1)
Sodium: 143 mEq/L (ref 135–145)
TOTAL PROTEIN: 6.8 g/dL (ref 6.0–8.3)

## 2016-10-31 LAB — CBC
HEMATOCRIT: 39.1 % (ref 36.0–46.0)
HEMOGLOBIN: 12.9 g/dL (ref 12.0–15.0)
MCHC: 32.8 g/dL (ref 30.0–36.0)
MCV: 91.6 fl (ref 78.0–100.0)
PLATELETS: 363 10*3/uL (ref 150.0–400.0)
RBC: 4.27 Mil/uL (ref 3.87–5.11)
RDW: 14.2 % (ref 11.5–15.5)
WBC: 10.2 10*3/uL (ref 4.0–10.5)

## 2016-10-31 LAB — VITAMIN D 25 HYDROXY (VIT D DEFICIENCY, FRACTURES): VITD: 54.44 ng/mL (ref 30.00–100.00)

## 2016-10-31 LAB — TSH: TSH: 3.2 u[IU]/mL (ref 0.35–4.50)

## 2016-10-31 LAB — HEMOGLOBIN A1C: Hgb A1c MFr Bld: 5.8 % (ref 4.6–6.5)

## 2016-11-01 ENCOUNTER — Encounter: Payer: Self-pay | Admitting: Adult Health

## 2016-11-01 ENCOUNTER — Telehealth: Payer: Self-pay | Admitting: Adult Health

## 2016-11-01 ENCOUNTER — Ambulatory Visit (INDEPENDENT_AMBULATORY_CARE_PROVIDER_SITE_OTHER): Payer: Medicare PPO | Admitting: Adult Health

## 2016-11-01 DIAGNOSIS — J9611 Chronic respiratory failure with hypoxia: Secondary | ICD-10-CM | POA: Diagnosis not present

## 2016-11-01 DIAGNOSIS — J449 Chronic obstructive pulmonary disease, unspecified: Secondary | ICD-10-CM | POA: Diagnosis not present

## 2016-11-01 LAB — URINALYSIS, ROUTINE W REFLEX MICROSCOPIC
Bilirubin Urine: NEGATIVE
Hgb urine dipstick: NEGATIVE
KETONES UR: NEGATIVE
Nitrite: NEGATIVE
PH: 6 (ref 5.0–8.0)
RBC / HPF: NONE SEEN (ref 0–?)
Specific Gravity, Urine: 1.02 (ref 1.000–1.030)
Total Protein, Urine: NEGATIVE
URINE GLUCOSE: NEGATIVE
UROBILINOGEN UA: 0.2 (ref 0.0–1.0)

## 2016-11-01 NOTE — Assessment & Plan Note (Signed)
Stable on current regimen  Steroid discussion   Plan  Patient Instructions  Order for pediatric wheelchair.  Continue on Ipratropium Neb Four times a day   Continue on Prednisone 10mg  daily  Continue on Brovana and Budesonide Neb Twice daily  .  Continue on 2 L rest and 3l/m activity .  Follow up in 2  month in [facility] med center with Dr. Elsworth Soho  And As needed   Please contact office for sooner follow up if symptoms do not improve or worsen or seek emergency care

## 2016-11-01 NOTE — Addendum Note (Signed)
Addended by: Peggyann Shoals on: 11/01/2016 11:29 AM   Modules accepted: Orders

## 2016-11-01 NOTE — Assessment & Plan Note (Signed)
Cont on O2   Plan  Patient Instructions  Order for pediatric wheelchair.  Continue on Ipratropium Neb Four times a day   Continue on Prednisone 10mg  daily  Continue on Brovana and Budesonide Neb Twice daily  .  Continue on 2 L rest and 3l/m activity .  Follow up in 2  month in [facility] med center with Dr. Elsworth Soho  And As needed   Please contact office for sooner follow up if symptoms do not improve or worsen or seek emergency care

## 2016-11-01 NOTE — Patient Instructions (Signed)
Order for pediatric wheelchair.  Continue on Ipratropium Neb Four times a day   Continue on Prednisone 10mg  daily  Continue on Brovana and Budesonide Neb Twice daily  .  Continue on 2 L rest and 3l/m activity .  Follow up in 2  month in [facility] med center with Dr. Elsworth Soho  And As needed   Please contact office for sooner follow up if symptoms do not improve or worsen or seek emergency care

## 2016-11-01 NOTE — Progress Notes (Signed)
@Patient  ID: [name], female    DOB: 03-26-1941, 76 y.o.   MRN: [id number]  Chief Complaint  Patient presents with  . Follow-up    COPD     Referring provider: Mosie Lukes, MD  HPI: 25 yowf , heavy ex- smoker, quit 2002 with GOLD D COPD FEV1 26% 6/10 on 24 h O2 since 2009.  -completed pulm rehab oct '11  Recurrent flares 2-3/yr requiring steroid bursts  She uses 3 liters 24/7. On a regimen of budesonide/brovana & alb/atrovent nebs   Significant tests/ events Admitted 12/2012 for COPD exacerbation .ABG 7.39/55/75 on 3L [place]  She required low dose prednisone since her admit in 02/2013   Had colonoscopy with polyp removal 06/2013  08/2013 BL pneumonia  03/2014 ONO on 2L >> no sig desatn  CT chest 10/2014 neg PE  12/06/2014 >>2.5 L at rest , 3L on exertion - ONO ok in the past [facility]   11/01/2016 Follow up : COPD/ Chronic O2 dependent RF  Patient presents for a one-month follow-up for COPD. She says overall that her breathing is at baseline. She's had no flare of cough or wheezing since last ov . She remains on prednisone 10 mg daily. Steroid education was given. She remains on budesonide and Brovana twice daily. She uses Atrovent nebulizer 4 times daily. Flu, Pneumovax and Prevnar vaccines are up-to-date. Remains on oxygen 2 L at rest and uses 3 L with walking. She would like to have a lightweight wheelchair . It is hard for her walk long distances with oxygen .    Allergies  Allergen Reactions  . Morphine Other (See Comments)    REACTION: nightmares  . Ambien [Zolpidem] Anxiety  . Cymbalta [Duloxetine Hcl] Anxiety  . Nitrofurantoin Rash    Immunization History  Administered Date(s) Administered  . H1N1 11/02/2008  . Influenza Split 07/31/2011  . Influenza Whole 09/22/2007, 08/18/2009, 08/02/2010  . Influenza, High Dose Seasonal PF 06/26/2016  . Influenza,inj,Quad PF,36+ Mos 07/14/2013, 08/05/2014, 07/14/2015  .  Influenza-Unspecified 09/20/2012  . Pneumococcal Conjugate-13 08/10/2014  . Pneumococcal Polysaccharide-23 11/30/2003, 08/02/2010  . Td 12/14/2002  . Tdap 08/31/2013  . Zoster 01/26/2011    Past Medical History:  Diagnosis Date  . Anxiety   . Arthralgia   . Atrophic vaginitis 12/07/2012  . Chest pain   . Clostridium difficile diarrhea 12/27/2014  . COPD (chronic obstructive pulmonary disease) ([place])   . Depression   . Diarrhea 09/02/2013  . Dyspnea   . Dysuria   . Edema   . GERD (gastroesophageal reflux disease)   . Glucose intolerance (impaired glucose tolerance)   . History of small bowel obstruction   . Hx of adenomatous colonic polyps   . Hyperglycemia 12/01/2013  . Hypertension   . Hypokalemia 11/28/2014  . Ischemic colitis ([place])   . Leukocytosis   . Medicare annual wellness visit, subsequent 01/12/2016  . Migraine   . Nocturia 09/02/2013  . Osteoporosis   . Pallor   . Thyroid disease   . Weakness   . Weight loss     Tobacco History: History  Smoking Status  . Former Smoker  . Packs/day: 2.00  . Years: 40.00  . Types: Cigarettes  . Quit date: 10/29/2001  Smokeless Tobacco  . Never Used   Counseling given: Not Answered   Outpatient Encounter Prescriptions as of 11/01/2016  Medication Sig  . albuterol (PROVENTIL HFA;VENTOLIN HFA) 108 (90 BASE) MCG/ACT inhaler Inhale 2 puffs into the lungs  every 6 (six) hours as needed for wheezing or shortness of breath.  Marland Kitchen albuterol (PROVENTIL) (2.5 MG/3ML) 0.083% nebulizer solution Take 3 mLs (2.5 mg total) by nebulization every 4 (four) hours as needed for wheezing or shortness of breath. Dx 496  . ALPRAZolam (XANAX) 0.5 MG tablet Take 1 tablet (0.5 mg total) by mouth 2 (two) times daily as needed for anxiety.  Marland Kitchen arformoterol (BROVANA) 15 MCG/2ML NEBU Take 2 mLs (15 mcg total) by nebulization 2 (two) times daily.  . budesonide (PULMICORT) 0.25 MG/2ML nebulizer solution Take 2 mLs (0.25 mg total) by nebulization 2 (two) times daily.  Dx: J44.9  . Calcium Carbonate-Vitamin D 600-400 MG-UNIT per tablet Take 1 tablet by mouth 2 (two) times daily.    . citalopram (CELEXA) 20 MG tablet TAKE 1 TABLET(20 MG) BY MOUTH DAILY  . Ferrous Fumarate-Folic Acid (HEMOCYTE-F) 324-1 MG TABS Take 1 tablet by mouth daily.  . fluticasone (FLONASE) 50 MCG/ACT nasal spray Place 2 sprays into both nostrils 2 (two) times daily as needed.   . Glucosamine-Chondroitin (GLUCOSAMINE CHONDR COMPLEX PO) Take 1 tablet by mouth 2 (two) times daily.   Marland Kitchen ipratropium (ATROVENT) 0.02 % nebulizer solution Take 2.5 mLs (0.5 mg total) by nebulization 4 (four) times daily.  . metoprolol succinate (TOPROL-XL) 25 MG 24 hr tablet Take 1 tablet (25 mg total) by mouth daily.  . mirtazapine (REMERON) 15 MG tablet Take 1 tablet (15 mg total) by mouth at bedtime.  . ondansetron (ZOFRAN) 4 MG tablet Take 1 tablet (4 mg total) by mouth every 8 (eight) hours as needed for nausea or vomiting.  . predniSONE (DELTASONE) 10 MG tablet Take 1 tablet (10 mg total) by mouth daily.  . ranitidine (ZANTAC) 300 MG capsule Take 1 capsule (300 mg total) by mouth every evening.   No facility-administered encounter medications on file as of 11/01/2016.      Review of Systems  Constitutional:   No  weight loss, night sweats,  Fevers, chills,  +fatigue, or  lassitude.  HEENT:   No headaches,  Difficulty swallowing,  Tooth/dental problems, or  Sore throat,                No sneezing, itching, ear ache, nasal congestion, post nasal drip,   CV:  No chest pain,  Orthopnea, PND, swelling in lower extremities, anasarca, dizziness, palpitations, syncope.   GI  No heartburn, indigestion, abdominal pain, nausea, vomiting, diarrhea, change in bowel habits, loss of appetite, bloody stools.   Resp:  No chest wall deformity  Skin: no rash or lesions.  GU: no dysuria, change in color of urine, no urgency or frequency.  No flank pain, no hematuria   MS:  No joint pain or swelling.  No decreased  range of motion.  No back pain.    Physical Exam  BP 125/73 (BP Location: Left Arm, Cuff Size: Normal)   Pulse 75   Ht 5\' 1"  (1.549 m)   Wt 98 lb (44.5 kg)   SpO2 98%   BMI 18.52 kg/m   GEN: A/Ox3; pleasant , NAD, elderly , chronically ill appearing on O2    HEENT:  [place]/AT,  EACs-clear, TMs-wnl, NOSE-clear drainage THROAT-clear, no lesions, no postnasal drip or exudate noted.   NECK:  Supple w/ fair ROM; no JVD; normal carotid impulses w/o bruits; no thyromegaly or nodules palpated; no lymphadenopathy.    RESP Decreased BS in bases .  no accessory muscle use, no dullness to percussion  CARD:  RRR, no  m/r/g, no peripheral edema, pulses intact, no cyanosis or clubbing.  GI:   Soft & nt; nml bowel sounds; no organomegaly or masses detected.   Musco: Warm bil, no deformities or joint swelling noted.   Neuro: alert, no focal deficits noted.    Skin: Warm, no lesions or rashes  Psych:  No change in mood or affect. No depression or anxiety.  No memory loss.  Lab Results:  CBC    Component Value Date/Time   WBC 10.2 10/31/2016 1321   RBC 4.27 10/31/2016 1321   HGB 12.9 10/31/2016 1321   HCT 39.1 10/31/2016 1321   PLT 363.0 10/31/2016 1321   MCV 91.6 10/31/2016 1321   MCH 27.1 11/14/2014 0545   MCHC 32.8 10/31/2016 1321   RDW 14.2 10/31/2016 1321   LYMPHSABS 1.8 09/15/2015 1555   MONOABS 0.4 09/15/2015 1555   EOSABS 0.1 09/15/2015 1555   BASOSABS 0.0 09/15/2015 1555    BMET    Component Value Date/Time   NA 143 10/31/2016 1321   K 4.0 10/31/2016 1321   CL 96 10/31/2016 1321   CO2 41 (H) 10/31/2016 1321   GLUCOSE 99 10/31/2016 1321   BUN 26 (H) 10/31/2016 1321   CREATININE 0.50 10/31/2016 1321   CREATININE 0.58 11/12/2014 1513   CALCIUM 9.1 10/31/2016 1321   GFRNONAA >90 11/14/2014 0545   GFRAA >90 11/14/2014 0545    BNP    Component Value Date/Time   BNP 88.5 11/13/2014 1445    ProBNP    Component Value Date/Time   PROBNP 126.4 (H) 12/27/2012 1010      Imaging: No results found.   Assessment & Plan:   COPD mixed type ([place]) Stable on current regimen  Steroid discussion   Plan  Patient Instructions  Order for pediatric wheelchair.  Continue on Ipratropium Neb Four times a day   Continue on Prednisone 10mg  daily  Continue on Brovana and Budesonide Neb Twice daily  .  Continue on 2 L rest and 3l/m activity .  Follow up in 2  month in [facility] med center with Dr. Elsworth Soho  And As needed   Please contact office for sooner follow up if symptoms do not improve or worsen or seek emergency care       Chronic respiratory failure Cont on O2   Plan  Patient Instructions  Order for pediatric wheelchair.  Continue on Ipratropium Neb Four times a day   Continue on Prednisone 10mg  daily  Continue on Brovana and Budesonide Neb Twice daily  .  Continue on 2 L rest and 3l/m activity .  Follow up in 2  month in [facility] med center with Dr. Elsworth Soho  And As needed   Please contact office for sooner follow up if symptoms do not improve or worsen or seek emergency care          Rexene Edison, NP 11/01/2016

## 2016-11-02 NOTE — Telephone Encounter (Signed)
Spoke with Corene Cornea, He will send the fax for this pt. Today, Will hold in Triage to check on printer up front to see if it has arrived yet.

## 2016-11-03 NOTE — Progress Notes (Signed)
Reviewed & agree with plan  

## 2016-11-05 NOTE — Telephone Encounter (Signed)
Fax received, filled out and signed by [employer].  Faxed back to [place] at [facility] at [phone]

## 2016-11-05 NOTE — Telephone Encounter (Signed)
Spoke with [employer]. Fax has been received and is waiting to be signed by TP.   Will forward to [facility] to follow and document once form has been faxed.

## 2016-11-06 DIAGNOSIS — J9611 Chronic respiratory failure with hypoxia: Secondary | ICD-10-CM | POA: Diagnosis not present

## 2016-11-06 DIAGNOSIS — M6281 Muscle weakness (generalized): Secondary | ICD-10-CM | POA: Diagnosis not present

## 2016-11-06 DIAGNOSIS — R0602 Shortness of breath: Secondary | ICD-10-CM | POA: Diagnosis not present

## 2016-11-06 DIAGNOSIS — J449 Chronic obstructive pulmonary disease, unspecified: Secondary | ICD-10-CM | POA: Diagnosis not present

## 2016-11-09 DIAGNOSIS — R0602 Shortness of breath: Secondary | ICD-10-CM | POA: Diagnosis not present

## 2016-11-19 ENCOUNTER — Encounter: Payer: Self-pay | Admitting: Family Medicine

## 2016-11-19 ENCOUNTER — Ambulatory Visit (INDEPENDENT_AMBULATORY_CARE_PROVIDER_SITE_OTHER): Payer: Medicare PPO | Admitting: Family Medicine

## 2016-11-19 VITALS — BP 98/64 | HR 74 | Temp 98.2°F | Wt 97.4 lb

## 2016-11-19 DIAGNOSIS — J441 Chronic obstructive pulmonary disease with (acute) exacerbation: Secondary | ICD-10-CM | POA: Diagnosis not present

## 2016-11-19 DIAGNOSIS — Z299 Encounter for prophylactic measures, unspecified: Secondary | ICD-10-CM | POA: Diagnosis not present

## 2016-11-19 DIAGNOSIS — R739 Hyperglycemia, unspecified: Secondary | ICD-10-CM

## 2016-11-19 DIAGNOSIS — R159 Full incontinence of feces: Secondary | ICD-10-CM | POA: Diagnosis not present

## 2016-11-19 DIAGNOSIS — E039 Hypothyroidism, unspecified: Secondary | ICD-10-CM | POA: Diagnosis not present

## 2016-11-19 DIAGNOSIS — I10 Essential (primary) hypertension: Secondary | ICD-10-CM

## 2016-11-19 DIAGNOSIS — R7989 Other specified abnormal findings of blood chemistry: Secondary | ICD-10-CM

## 2016-11-19 DIAGNOSIS — M810 Age-related osteoporosis without current pathological fracture: Secondary | ICD-10-CM | POA: Diagnosis not present

## 2016-11-19 DIAGNOSIS — D649 Anemia, unspecified: Secondary | ICD-10-CM

## 2016-11-19 MED ORDER — CITALOPRAM HYDROBROMIDE 20 MG PO TABS: ORAL_TABLET | ORAL | 0 refills | Status: DC

## 2016-11-19 MED ORDER — METOPROLOL SUCCINATE ER 25 MG PO TB24: 25.0000 mg | ORAL_TABLET | Freq: Every day | ORAL | 3 refills | Status: DC

## 2016-11-19 MED ORDER — MIRTAZAPINE 15 MG PO TABS: 15.0000 mg | ORAL_TABLET | Freq: Every day | ORAL | 1 refills | Status: DC

## 2016-11-19 NOTE — Progress Notes (Signed)
Pre visit review using our clinic review tool, if applicable. No additional management support is needed unless otherwise documented below in the visit note. 

## 2016-11-19 NOTE — Assessment & Plan Note (Signed)
minimize simple carbs. Increase exercise as tolerated.  

## 2016-11-19 NOTE — Progress Notes (Signed)
Subjective:    Patient ID: [name], female    DOB: 1941-08-13, 76 y.o.   MRN: [id number]  Chief Complaint  Patient presents with  . Follow-up    HPI Patient is in today for a follow up on numerous medical concerns including hyperglycemia, hypothyroidism and HTN. Patient also complains of lower back pain which is tolerable but persistent. She uses topical patches with adequate relief most days. She denies any radicular symptoms or incontinence. She struggles daily with DOE but it is stable and she has not had any recent hospitalizations. Denies CP/palp/HA/congestion/fevers/GI or GU c/o. Taking meds as prescribed  Past Medical History:  Diagnosis Date  . Anxiety   . Arthralgia   . Atrophic vaginitis 12/07/2012  . Chest pain   . Clostridium difficile diarrhea 12/27/2014  . COPD (chronic obstructive pulmonary disease) ([place])   . Depression   . Diarrhea 09/02/2013  . Dyspnea   . Dysuria   . Edema   . GERD (gastroesophageal reflux disease)   . Glucose intolerance (impaired glucose tolerance)   . History of small bowel obstruction   . Hx of adenomatous colonic polyps   . Hyperglycemia 12/01/2013  . Hypertension   . Hypokalemia 11/28/2014  . Ischemic colitis ([place])   . Leukocytosis   . Medicare annual wellness visit, subsequent 01/12/2016  . Migraine   . Nocturia 09/02/2013  . Osteoporosis   . Pallor   . Thyroid disease   . Weakness   . Weight loss     Past Surgical History:  Procedure Laterality Date  . ABDOMINAL HYSTERECTOMY  1993  . BLADDER REPAIR    . CHOLECYSTECTOMY    . COLONOSCOPY WITH PROPOFOL N/A 06/08/2013   Procedure: COLONOSCOPY WITH PROPOFOL;  Surgeon: Lafayette Dragon, MD;  Location: [facility];  Service: Endoscopy;  Laterality: N/A;  . DILATION AND CURETTAGE OF UTERUS     40 yrs ago  . EYE SURGERY  04/2011   bil. eyes  . TUBAL LIGATION      Family History  Problem Relation Age of Onset  . Alcohol abuse Mother   . Heart disease Mother   . Stroke  Mother   . Hypertension Mother   . Kidney disease Mother   . COPD Mother   . Osteoporosis Mother     hip fracture  . COPD Brother   . Alcohol abuse Brother     drug abuse  . Hearing loss Brother     mvp  . Colon cancer Paternal Aunt   . Prostate cancer    . Alcohol abuse Father     MVA  . Thyroid disease Daughter   . COPD Paternal Grandfather   . COPD Sister   . Hypertension Sister   . Anxiety disorder Sister   . COPD Sister   . COPD Brother     current smoker  . Hearing loss Brother     cad  . COPD Brother     Social History   Social History  . Marital status: Married    Spouse name: N/A  . Number of children: 1  . Years of education: N/A   Occupational History  . Retired Hydrologist Retired   Social History Main Topics  . Smoking status: Former Smoker    Packs/day: 2.00    Years: 40.00    Types: Cigarettes    Quit date: 10/29/2001  . Smokeless tobacco: Never Used  . Alcohol use No  . Drug use: No  .  Sexual activity: Not on file   Other Topics Concern  . Not on file   Social History Narrative  . No narrative on file    Outpatient Medications Prior to Visit  Medication Sig Dispense Refill  . albuterol (PROVENTIL HFA;VENTOLIN HFA) 108 (90 BASE) MCG/ACT inhaler Inhale 2 puffs into the lungs every 6 (six) hours as needed for wheezing or shortness of breath. 3 Inhaler 3  . albuterol (PROVENTIL) (2.5 MG/3ML) 0.083% nebulizer solution Take 3 mLs (2.5 mg total) by nebulization every 4 (four) hours as needed for wheezing or shortness of breath. Dx 496 300 mL 0  . ALPRAZolam (XANAX) 0.5 MG tablet Take 1 tablet (0.5 mg total) by mouth 2 (two) times daily as needed for anxiety. 180 tablet 1  . arformoterol (BROVANA) 15 MCG/2ML NEBU Take 2 mLs (15 mcg total) by nebulization 2 (two) times daily. 360 mL 3  . budesonide (PULMICORT) 0.25 MG/2ML nebulizer solution Take 2 mLs (0.25 mg total) by nebulization 2 (two) times daily. Dx: J44.9 360 mL 3  .  Calcium Carbonate-Vitamin D 600-400 MG-UNIT per tablet Take 1 tablet by mouth 2 (two) times daily.      . Ferrous Fumarate-Folic Acid (HEMOCYTE-F) 324-1 MG TABS Take 1 tablet by mouth daily. 30 each 5  . fluticasone (FLONASE) 50 MCG/ACT nasal spray Place 2 sprays into both nostrils 2 (two) times daily as needed.     . Glucosamine-Chondroitin (GLUCOSAMINE CHONDR COMPLEX PO) Take 1 tablet by mouth 2 (two) times daily.     Marland Kitchen ipratropium (ATROVENT) 0.02 % nebulizer solution Take 2.5 mLs (0.5 mg total) by nebulization 4 (four) times daily. 900 mL 3  . ondansetron (ZOFRAN) 4 MG tablet Take 1 tablet (4 mg total) by mouth every 8 (eight) hours as needed for nausea or vomiting. 9 tablet 0  . predniSONE (DELTASONE) 10 MG tablet Take 1 tablet (10 mg total) by mouth daily. 90 tablet 3  . ranitidine (ZANTAC) 300 MG capsule Take 1 capsule (300 mg total) by mouth every evening. 90 capsule 1  . citalopram (CELEXA) 20 MG tablet TAKE 1 TABLET(20 MG) BY MOUTH DAILY 30 tablet 0  . metoprolol succinate (TOPROL-XL) 25 MG 24 hr tablet Take 1 tablet (25 mg total) by mouth daily. 90 tablet 3  . mirtazapine (REMERON) 15 MG tablet Take 1 tablet (15 mg total) by mouth at bedtime. 30 tablet 1   No facility-administered medications prior to visit.     Allergies  Allergen Reactions  . Morphine Other (See Comments)    REACTION: nightmares  . Ambien [Zolpidem] Anxiety  . Cymbalta [Duloxetine Hcl] Anxiety  . Nitrofurantoin Rash    Review of Systems  Constitutional: Negative for fever and malaise/fatigue.  HENT: Negative for congestion.   Eyes: Negative for blurred vision.  Respiratory: Positive for shortness of breath. Negative for cough.   Cardiovascular: Negative for chest pain, palpitations and leg swelling.  Gastrointestinal: Negative for vomiting.  Musculoskeletal: Positive for back pain.  Skin: Negative for rash.  Neurological: Negative for loss of consciousness and headaches.       Objective:    Physical  Exam  Constitutional: She is oriented to person, place, and time. She appears well-developed and well-nourished. No distress.  HENT:  Head: Normocephalic and atraumatic.  Eyes: Conjunctivae are normal.  Neck: Normal range of motion. No thyromegaly present.  Cardiovascular: Normal rate and regular rhythm.   Pulmonary/Chest: Effort normal and breath sounds normal. She has no wheezes.  Prolonged expiratory flow. Poor air  movement.   Abdominal: Soft. Bowel sounds are normal. There is no tenderness.  Musculoskeletal: Normal range of motion. She exhibits no edema or deformity.  Neurological: She is alert and oriented to person, place, and time.  Skin: Skin is warm and dry. She is not diaphoretic.  Psychiatric: She has a normal mood and affect.    BP 98/64 (BP Location: Left Arm, Patient Position: Sitting, Cuff Size: Normal)   Pulse 74   Temp 98.2 F (36.8 C) (Oral)   Wt 97 lb 6.4 oz (44.2 kg)   SpO2 95%   BMI 18.40 kg/m  Wt Readings from Last 3 Encounters:  11/19/16 97 lb 6.4 oz (44.2 kg)  11/01/16 98 lb (44.5 kg)  10/04/16 99 lb (44.9 kg)     Lab Results  Component Value Date   WBC 10.2 10/31/2016   HGB 12.9 10/31/2016   HCT 39.1 10/31/2016   PLT 363.0 10/31/2016   GLUCOSE 99 10/31/2016   CHOL 191 04/24/2016   TRIG 203.0 (H) 04/24/2016   HDL 76.50 04/24/2016   LDLDIRECT 70.0 04/24/2016   LDLCALC 76 01/12/2016   ALT 16 10/31/2016   AST 21 10/31/2016   NA 143 10/31/2016   K 4.0 10/31/2016   CL 96 10/31/2016   CREATININE 0.50 10/31/2016   BUN 26 (H) 10/31/2016   CO2 41 (H) 10/31/2016   TSH 3.20 10/31/2016   HGBA1C 5.8 10/31/2016    Lab Results  Component Value Date   TSH 3.20 10/31/2016   Lab Results  Component Value Date   WBC 10.2 10/31/2016   HGB 12.9 10/31/2016   HCT 39.1 10/31/2016   MCV 91.6 10/31/2016   PLT 363.0 10/31/2016   Lab Results  Component Value Date   NA 143 10/31/2016   K 4.0 10/31/2016   CO2 41 (H) 10/31/2016   GLUCOSE 99 10/31/2016     BUN 26 (H) 10/31/2016   CREATININE 0.50 10/31/2016   BILITOT 0.3 10/31/2016   ALKPHOS 48 10/31/2016   AST 21 10/31/2016   ALT 16 10/31/2016   PROT 6.8 10/31/2016   ALBUMIN 4.2 10/31/2016   CALCIUM 9.1 10/31/2016   ANIONGAP 5 11/14/2014   GFR 128.08 10/31/2016   Lab Results  Component Value Date   CHOL 191 04/24/2016   Lab Results  Component Value Date   HDL 76.50 04/24/2016   Lab Results  Component Value Date   LDLCALC 76 01/12/2016   Lab Results  Component Value Date   TRIG 203.0 (H) 04/24/2016   Lab Results  Component Value Date   CHOLHDL 3 04/24/2016   Lab Results  Component Value Date   HGBA1C 5.8 10/31/2016   I acted as a Education administrator for Dr. Charlett Blake. Princess, RMA     Assessment & Plan:   Problem List Items Addressed This Visit    Hypothyroidism   Relevant Medications   metoprolol succinate (TOPROL-XL) 25 MG 24 hr tablet   Other Relevant Orders   TSH   HYPERTENSION, BENIGN ESSENTIAL    Well controlled, no changes to meds. Encouraged heart healthy diet such as the DASH diet and exercise as tolerated.       Relevant Medications   metoprolol succinate (TOPROL-XL) 25 MG 24 hr tablet   Other Relevant Orders   CBC   Comprehensive metabolic panel   Lipid panel   Osteoporosis    Check dexa.scan and continue to stay as active as tolerated and encouraged to get adequate exercise, calcium and vitamin d intake  Relevant Orders   DG Bone Density   Incontinence of feces    Referred to gastroenterology for further consideration due to daily leakage.      Relevant Orders   Ambulatory referral to Gastroenterology   Anemia   Relevant Orders   VITAMIN D 25 Hydroxy (Vit-D Deficiency, Fractures)   Hyperglycemia     minimize simple carbs. Increase exercise as tolerated.       Relevant Orders   Hemoglobin A1c    Other Visit Diagnoses    COPD exacerbation ([place])    -  Primary   Relevant Orders   DME Wheelchair manual   Encounter for preventive measure        Relevant Orders   CBC   Comprehensive metabolic panel   Hemoglobin A1c   TSH   Lipid panel   VITAMIN D 25 Hydroxy (Vit-D Deficiency, Fractures)      I am having [name] maintain her Calcium Carbonate-Vitamin D, Glucosamine-Chondroitin (GLUCOSAMINE CHONDR COMPLEX PO), albuterol, fluticasone, ondansetron, albuterol, Ferrous Fumarate-Folic Acid, predniSONE, ranitidine, arformoterol, budesonide, ipratropium, ALPRAZolam, mirtazapine, citalopram, and metoprolol succinate.  Meds ordered this encounter  Medications  . mirtazapine (REMERON) 15 MG tablet    Sig: Take 1 tablet (15 mg total) by mouth at bedtime.    Dispense:  30 tablet    Refill:  1  . citalopram (CELEXA) 20 MG tablet    Sig: TAKE 1 TABLET(20 MG) BY MOUTH DAILY    Dispense:  30 tablet    Refill:  0  . metoprolol succinate (TOPROL-XL) 25 MG 24 hr tablet    Sig: Take 1 tablet (25 mg total) by mouth daily.    Dispense:  90 tablet    Refill:  3    CMA served as scribe during this visit. History, Physical and Plan performed by medical provider. Documentation and orders reviewed and attested to.  Penni Homans, MD

## 2016-11-19 NOTE — Assessment & Plan Note (Signed)
Referred to gastroenterology for further consideration due to daily leakage.

## 2016-11-19 NOTE — Patient Instructions (Addendum)
Moist heat: wet cloth with a heating pad on top. Use as needed for back pain. Take 2 Calcium tablets one in the morning and one in the evening Take Vitamin D 500-1000mg  daily      Back Pain, Adult Back pain is very common in adults.The cause of back pain is rarely dangerous and the pain often gets better over time.The cause of your back pain may not be known. Some common causes of back pain include:  Strain of the muscles or ligaments supporting the spine.  Wear and tear (degeneration) of the spinal disks.  Arthritis.  Direct injury to the back. For many people, back pain may return. Since back pain is rarely dangerous, most people can learn to manage this condition on their own. Follow these instructions at home: Watch your back pain for any changes. The following actions may help to lessen any discomfort you are feeling:  Remain active. It is stressful on your back to sit or stand in one place for long periods of time. Do not sit, drive, or stand in one place for more than 30 minutes at a time. Take short walks on even surfaces as soon as you are able.Try to increase the length of time you walk each day.  Exercise regularly as directed by your health care provider. Exercise helps your back heal faster. It also helps avoid future injury by keeping your muscles strong and flexible.  Do not stay in bed.Resting more than 1-2 days can delay your recovery.  Pay attention to your body when you bend and lift. The most comfortable positions are those that put less stress on your recovering back. Always use proper lifting techniques, including:  Bending your knees.  Keeping the load close to your body.  Avoiding twisting.  Find a comfortable position to sleep. Use a firm mattress and lie on your side with your knees slightly bent. If you lie on your back, put a pillow under your knees.  Avoid feeling anxious or stressed.Stress increases muscle tension and can worsen back pain.It is  important to recognize when you are anxious or stressed and learn ways to manage it, such as with exercise.  Take medicines only as directed by your health care provider. Over-the-counter medicines to reduce pain and inflammation are often the most helpful.Your health care provider may prescribe muscle relaxant drugs.These medicines help dull your pain so you can more quickly return to your normal activities and healthy exercise.  Apply ice to the injured area:  Put ice in a plastic bag.  Place a towel between your skin and the bag.  Leave the ice on for 20 minutes, 2-3 times a day for the first 2-3 days. After that, ice and heat may be alternated to reduce pain and spasms.  Maintain a healthy weight. Excess weight puts extra stress on your back and makes it difficult to maintain good posture. Contact a health care provider if:  You have pain that is not relieved with rest or medicine.  You have increasing pain going down into the legs or buttocks.  You have pain that does not improve in one week.  You have night pain.  You lose weight.  You have a fever or chills. Get help right away if:  You develop new bowel or bladder control problems.  You have unusual weakness or numbness in your arms or legs.  You develop nausea or vomiting.  You develop abdominal pain.  You feel faint. This information is not intended to  replace advice given to you by your health care provider. Make sure you discuss any questions you have with your health care provider. Document Released: 10/15/2005 Document Revised: 02/23/2016 Document Reviewed: 02/16/2014 Elsevier Interactive Patient Education  2017 [employer].

## 2016-11-19 NOTE — Assessment & Plan Note (Signed)
Check dexa.scan and continue to stay as active as tolerated and encouraged to get adequate exercise, calcium and vitamin d intake

## 2016-11-20 DIAGNOSIS — M6281 Muscle weakness (generalized): Secondary | ICD-10-CM | POA: Diagnosis not present

## 2016-11-20 DIAGNOSIS — J9611 Chronic respiratory failure with hypoxia: Secondary | ICD-10-CM | POA: Diagnosis not present

## 2016-11-20 DIAGNOSIS — R0602 Shortness of breath: Secondary | ICD-10-CM | POA: Diagnosis not present

## 2016-11-20 DIAGNOSIS — J449 Chronic obstructive pulmonary disease, unspecified: Secondary | ICD-10-CM | POA: Diagnosis not present

## 2016-11-21 NOTE — Assessment & Plan Note (Signed)
Well controlled, no changes to meds. Encouraged heart healthy diet such as the DASH diet and exercise as tolerated.  °

## 2016-11-23 ENCOUNTER — Telehealth: Payer: Self-pay | Admitting: Adult Health

## 2016-11-23 NOTE — Telephone Encounter (Signed)
lmtcb x1 for pt. 

## 2016-11-26 MED ORDER — PREDNISONE 10 MG PO TABS: 10.0000 mg | ORAL_TABLET | Freq: Every day | ORAL | 3 refills | Status: DC

## 2016-11-26 NOTE — Telephone Encounter (Signed)
lmtcb x2 for pt. 

## 2016-11-26 NOTE — Telephone Encounter (Signed)
Pt returning call.[name] ° °

## 2016-11-26 NOTE — Telephone Encounter (Signed)
Spoke with pt. And according to TP's notes pt. Is suppose to continue on her prednisone. Rx was sent in to her pharmacy of choice. Nothing further is needed at this time.    : Signed Cosign: Cosign Not Required Encounter Date: 11/01/2016 10:45 AM  Editor: Melvenia Needles, NP (Nurse Practitioner)    Order for pediatric wheelchair.  Continue on Ipratropium Neb Four times a day   Continue on Prednisone 10mg  daily  Continue on Brovana and Budesonide Neb Twice daily  .  Continue on 2 L rest and 3l/m activity .  Follow up in 2  month in [facility] med [facility] with Dr. Elsworth Soho  And As needed   Please contact office for sooner follow up if symptoms do not improve or worsen or seek emergency care

## 2016-12-03 ENCOUNTER — Other Ambulatory Visit: Payer: Self-pay | Admitting: Family Medicine

## 2016-12-03 NOTE — Telephone Encounter (Signed)
Checked and patient was given #30 with 1 refill on 11/19/2016 Should have on refill left. So called the patient and informed, also refused this refill back to the pharmacy

## 2016-12-03 NOTE — Telephone Encounter (Signed)
Need to know last rx date given and date of last appt and next

## 2016-12-04 ENCOUNTER — Other Ambulatory Visit: Payer: Self-pay | Admitting: Family Medicine

## 2016-12-04 DIAGNOSIS — F329 Major depressive disorder, single episode, unspecified: Secondary | ICD-10-CM

## 2016-12-04 DIAGNOSIS — I10 Essential (primary) hypertension: Secondary | ICD-10-CM

## 2016-12-04 DIAGNOSIS — E876 Hypokalemia: Secondary | ICD-10-CM

## 2016-12-04 DIAGNOSIS — J449 Chronic obstructive pulmonary disease, unspecified: Secondary | ICD-10-CM

## 2016-12-04 DIAGNOSIS — F419 Anxiety disorder, unspecified: Principal | ICD-10-CM

## 2016-12-04 DIAGNOSIS — K219 Gastro-esophageal reflux disease without esophagitis: Secondary | ICD-10-CM

## 2016-12-04 DIAGNOSIS — R739 Hyperglycemia, unspecified: Secondary | ICD-10-CM

## 2016-12-04 MED ORDER — METOPROLOL SUCCINATE ER 25 MG PO TB24: 25.0000 mg | ORAL_TABLET | Freq: Every day | ORAL | 3 refills | Status: DC

## 2016-12-04 MED ORDER — RANITIDINE HCL 300 MG PO CAPS: 300.0000 mg | ORAL_CAPSULE | Freq: Every evening | ORAL | 1 refills | Status: DC

## 2016-12-04 MED ORDER — MIRTAZAPINE 15 MG PO TABS: 15.0000 mg | ORAL_TABLET | Freq: Every day | ORAL | 1 refills | Status: DC

## 2016-12-04 NOTE — Telephone Encounter (Signed)
Ok to refill mirtazapine to mail order. Last refill was on 11/19/2016  #30 with 1 refill Last office visit 11/19/2016 NEXT SCHEDULED OFFICE VISIT IS 03/19/2017 Contract signed on 08/12/2014/Low risk for UDS- needs updated. ADVISE PLEASE.

## 2016-12-04 NOTE — Telephone Encounter (Signed)
Self.   Refills needed. Ranitidine, Metoprolol Succinate, and also Mirtazapine   Pharmacy: [facility]

## 2016-12-06 ENCOUNTER — Telehealth: Payer: Self-pay | Admitting: Adult Health

## 2016-12-06 NOTE — Telephone Encounter (Signed)
Spoke with pt, states she is returning a call to TP and wishes to only speak to TP.  I asked if I could relay to TP what the call is regarding, she stated "TP knows, it's ok".  TP please advise.  Thanks!

## 2016-12-07 NOTE — Telephone Encounter (Signed)
Done

## 2016-12-10 DIAGNOSIS — R0602 Shortness of breath: Secondary | ICD-10-CM | POA: Diagnosis not present

## 2016-12-19 ENCOUNTER — Other Ambulatory Visit: Payer: Self-pay | Admitting: Family Medicine

## 2016-12-20 ENCOUNTER — Other Ambulatory Visit (HOSPITAL_BASED_OUTPATIENT_CLINIC_OR_DEPARTMENT_OTHER): Payer: Medicare PPO

## 2016-12-21 DIAGNOSIS — J9611 Chronic respiratory failure with hypoxia: Secondary | ICD-10-CM | POA: Diagnosis not present

## 2016-12-21 DIAGNOSIS — J449 Chronic obstructive pulmonary disease, unspecified: Secondary | ICD-10-CM | POA: Diagnosis not present

## 2016-12-21 DIAGNOSIS — M6281 Muscle weakness (generalized): Secondary | ICD-10-CM | POA: Diagnosis not present

## 2016-12-21 DIAGNOSIS — R0602 Shortness of breath: Secondary | ICD-10-CM | POA: Diagnosis not present

## 2017-01-02 ENCOUNTER — Ambulatory Visit: Payer: Medicare PPO | Admitting: Gastroenterology

## 2017-01-07 DIAGNOSIS — R0602 Shortness of breath: Secondary | ICD-10-CM | POA: Diagnosis not present

## 2017-01-15 ENCOUNTER — Telehealth: Payer: Self-pay | Admitting: Family Medicine

## 2017-01-15 NOTE — Telephone Encounter (Signed)
Patient called in today not requesting a refill on her alprazolam, but while on the phone I informed her she was given on 10/08/2016  #180 with 1 refill and was sent to [facility].  She was not aware of the refill.   She apologized and will call them for that refill.  She will call us back if she runs into any problems getting it filled.

## 2017-01-17 ENCOUNTER — Ambulatory Visit (HOSPITAL_BASED_OUTPATIENT_CLINIC_OR_DEPARTMENT_OTHER)
Admission: RE | Admit: 2017-01-17 | Discharge: 2017-01-17 | Disposition: A | Discharge status: Home / Self Care | Payer: Medicare PPO | Source: Ambulatory Visit | Attending: Family Medicine | Admitting: Family Medicine

## 2017-01-17 DIAGNOSIS — M810 Age-related osteoporosis without current pathological fracture: Secondary | ICD-10-CM | POA: Diagnosis not present

## 2017-01-18 DIAGNOSIS — R0602 Shortness of breath: Secondary | ICD-10-CM | POA: Diagnosis not present

## 2017-01-18 DIAGNOSIS — J9611 Chronic respiratory failure with hypoxia: Secondary | ICD-10-CM | POA: Diagnosis not present

## 2017-01-18 DIAGNOSIS — M6281 Muscle weakness (generalized): Secondary | ICD-10-CM | POA: Diagnosis not present

## 2017-01-18 DIAGNOSIS — J449 Chronic obstructive pulmonary disease, unspecified: Secondary | ICD-10-CM | POA: Diagnosis not present

## 2017-02-07 ENCOUNTER — Encounter: Payer: Self-pay | Admitting: Adult Health

## 2017-02-07 ENCOUNTER — Ambulatory Visit (INDEPENDENT_AMBULATORY_CARE_PROVIDER_SITE_OTHER): Payer: Medicare PPO | Admitting: Adult Health

## 2017-02-07 ENCOUNTER — Ambulatory Visit (HOSPITAL_BASED_OUTPATIENT_CLINIC_OR_DEPARTMENT_OTHER)
Admission: RE | Admit: 2017-02-07 | Discharge: 2017-02-07 | Disposition: A | Discharge status: Home / Self Care | Payer: Medicare PPO | Source: Ambulatory Visit | Attending: Adult Health | Admitting: Adult Health

## 2017-02-07 VITALS — BP 97/64 | HR 66 | Ht 61.0 in | Wt 94.0 lb

## 2017-02-07 DIAGNOSIS — R0789 Other chest pain: Secondary | ICD-10-CM | POA: Diagnosis not present

## 2017-02-07 DIAGNOSIS — J9611 Chronic respiratory failure with hypoxia: Secondary | ICD-10-CM

## 2017-02-07 DIAGNOSIS — R0602 Shortness of breath: Secondary | ICD-10-CM | POA: Diagnosis not present

## 2017-02-07 DIAGNOSIS — R079 Chest pain, unspecified: Secondary | ICD-10-CM | POA: Diagnosis not present

## 2017-02-07 DIAGNOSIS — J449 Chronic obstructive pulmonary disease, unspecified: Secondary | ICD-10-CM

## 2017-02-07 DIAGNOSIS — I10 Essential (primary) hypertension: Secondary | ICD-10-CM | POA: Diagnosis not present

## 2017-02-07 DIAGNOSIS — J439 Emphysema, unspecified: Secondary | ICD-10-CM | POA: Diagnosis not present

## 2017-02-07 MED ORDER — DOXYCYCLINE HYCLATE 100 MG PO TABS
100.0000 mg | ORAL_TABLET | Freq: Two times a day (BID) | ORAL | 0 refills | Status: DC
Start: 2017-02-07 — End: 2017-03-15

## 2017-02-07 MED ORDER — PREDNISONE 10 MG PO TABS
ORAL_TABLET | ORAL | 0 refills | Status: DC
Start: 2017-02-07 — End: 2017-03-15

## 2017-02-07 NOTE — Assessment & Plan Note (Signed)
Cont on o2 .  

## 2017-02-07 NOTE — Progress Notes (Signed)
@Patient  ID: [name], female    DOB: 1941-03-16, 76 y.o.   MRN: [id number]  Chief Complaint  Patient presents with  . Follow-up    COPD     Referring provider: Mosie Lukes, MD  HPI: 33 yowf , heavy ex- smoker, quit 2002 with GOLD D COPD FEV1 26% 6/10 on 24 h O2 since 2009.  -completed pulm rehab oct '11  Recurrent flares 2-3/yr requiring steroid bursts  She uses 3 liters 24/7. On a regimen of budesonide/brovana &alb/atrovent nebs   Significant tests/ events Admitted 12/2012 for COPD exacerbation .ABG 7.39/55/75 on 3L [place]  She required low dose prednisone since her admit in 02/2013   Had colonoscopy with polyp removal 06/2013  08/2013 BL pneumonia  03/2014 ONO on 2L >>no sig desatn  CT chest 10/2014 neg PE  12/06/2014 >>2.5 L at rest , 3L on exertion - ONO ok in the past [facility] 12/2015   02/07/2017 Acute OV  : COPD /Chronic O2 RF  Pt complains of 3 days of cough, congestion , wheezing , sinus congestion . Worried symptoms will get worse over the weekend. No fever , edema or orthopnea. On daily prednisone , cut back to 5mg  feels she is not as good on lower dose.   On Brovana /Pulmicort Neb Twice daily  , Atrovent Four times a day  . On Prednisone 5mg  daily  Flu, PVX and Prevnar utd.  Remains on O2 at 2l/m rest and 3l/m walking .   Husband had some heart problems recently . Is getting better.  She is concerned that she has been having some intermittent chest tightenss and pain for 3 months . Stress test years ago was reported nml >10 yr ago .  Wants to get checked out by cardiology .   Allergies  Allergen Reactions  . Morphine Other (See Comments)    REACTION: nightmares  . Ambien [Zolpidem] Anxiety  . Cymbalta [Duloxetine Hcl] Anxiety  . Nitrofurantoin Rash    Immunization History  Administered Date(s) Administered  . H1N1 11/02/2008  . Influenza Split 07/31/2011  . Influenza Whole 09/22/2007, 08/18/2009, 08/02/2010  .  Influenza, High Dose Seasonal PF 06/26/2016  . Influenza,inj,Quad PF,36+ Mos 07/14/2013, 08/05/2014, 07/14/2015  . Influenza-Unspecified 09/20/2012  . Pneumococcal Conjugate-13 08/10/2014  . Pneumococcal Polysaccharide-23 11/30/2003, 08/02/2010  . Td 12/14/2002  . Tdap 08/31/2013  . Zoster 01/26/2011    Past Medical History:  Diagnosis Date  . Anxiety   . Arthralgia   . Atrophic vaginitis 12/07/2012  . Chest pain   . Clostridium difficile diarrhea 12/27/2014  . COPD (chronic obstructive pulmonary disease) ([place])   . Depression   . Diarrhea 09/02/2013  . Dyspnea   . Dysuria   . Edema   . GERD (gastroesophageal reflux disease)   . Glucose intolerance (impaired glucose tolerance)   . History of small bowel obstruction   . Hx of adenomatous colonic polyps   . Hyperglycemia 12/01/2013  . Hypertension   . Hypokalemia 11/28/2014  . Ischemic colitis ([place])   . Leukocytosis   . Medicare annual wellness visit, subsequent 01/12/2016  . Migraine   . Nocturia 09/02/2013  . Osteoporosis   . Pallor   . Thyroid disease   . Weakness   . Weight loss     Tobacco History: History  Smoking Status  . Former Smoker  . Packs/day: 2.00  . Years: 40.00  . Types: Cigarettes  . Quit date: 10/29/2001  Smokeless Tobacco  .  Never Used   Counseling given: Not Answered   Outpatient Encounter Prescriptions as of 02/07/2017  Medication Sig  . albuterol (PROVENTIL HFA;VENTOLIN HFA) 108 (90 BASE) MCG/ACT inhaler Inhale 2 puffs into the lungs every 6 (six) hours as needed for wheezing or shortness of breath.  Marland Kitchen albuterol (PROVENTIL) (2.5 MG/3ML) 0.083% nebulizer solution Take 3 mLs (2.5 mg total) by nebulization every 4 (four) hours as needed for wheezing or shortness of breath. Dx 496  . ALPRAZolam (XANAX) 0.5 MG tablet Take 1 tablet (0.5 mg total) by mouth 2 (two) times daily as needed for anxiety.  Marland Kitchen arformoterol (BROVANA) 15 MCG/2ML NEBU Take 2 mLs (15 mcg total) by nebulization 2 (two) times daily.  .  budesonide (PULMICORT) 0.25 MG/2ML nebulizer solution Take 2 mLs (0.25 mg total) by nebulization 2 (two) times daily. Dx: J44.9  . Calcium Carbonate-Vitamin D 600-400 MG-UNIT per tablet Take 1 tablet by mouth 2 (two) times daily.    . citalopram (CELEXA) 20 MG tablet TAKE 1 TABLET(20 MG) BY MOUTH DAILY  . Ferrous Fumarate-Folic Acid (HEMOCYTE-F) 324-1 MG TABS Take 1 tablet by mouth daily.  . fluticasone (FLONASE) 50 MCG/ACT nasal spray Place 2 sprays into both nostrils 2 (two) times daily as needed.   . Glucosamine-Chondroitin (GLUCOSAMINE CHONDR COMPLEX PO) Take 1 tablet by mouth 2 (two) times daily.   Marland Kitchen ipratropium (ATROVENT) 0.02 % nebulizer solution Take 2.5 mLs (0.5 mg total) by nebulization 4 (four) times daily.  . metoprolol succinate (TOPROL-XL) 25 MG 24 hr tablet Take 1 tablet (25 mg total) by mouth daily.  . mirtazapine (REMERON) 15 MG tablet Take 1 tablet (15 mg total) by mouth at bedtime.  . ondansetron (ZOFRAN) 4 MG tablet Take 1 tablet (4 mg total) by mouth every 8 (eight) hours as needed for nausea or vomiting.  . predniSONE (DELTASONE) 10 MG tablet Take 1 tablet (10 mg total) by mouth daily.  . ranitidine (ZANTAC) 300 MG capsule Take 1 capsule (300 mg total) by mouth every evening.  Marland Kitchen doxycycline (VIBRA-TABS) 100 MG tablet Take 1 tablet (100 mg total) by mouth 2 (two) times daily.  . predniSONE (DELTASONE) 10 MG tablet 4 tabs for 2 days, then 3 tabs for 2 days, 2 tabs for 2 days, then 1 tab daily   No facility-administered encounter medications on file as of 02/07/2017.      Review of Systems  Constitutional:   No  weight loss, night sweats,  Fevers, chills,  +fatigue, or  lassitude.  HEENT:   No headaches,  Difficulty swallowing,  Tooth/dental problems, or  Sore throat,                No sneezing, itching, ear ache,  +nasal congestion, post nasal drip,   CV:  No chest pain,  Orthopnea, PND, swelling in lower extremities, anasarca, dizziness, palpitations, syncope.   GI   No heartburn, indigestion, abdominal pain, nausea, vomiting, diarrhea, change in bowel habits, loss of appetite, bloody stools.   Resp:    No chest wall deformity  Skin: no rash or lesions.  GU: no dysuria, change in color of urine, no urgency or frequency.  No flank pain, no hematuria   MS:  No joint pain or swelling.  No decreased range of motion.  No back pain.    Physical Exam  BP 97/64 (BP Location: Left Arm, Patient Position: Sitting, Cuff Size: Normal)   Pulse 66   Ht 5\' 1"  (1.549 m)   Wt 94 lb (42.6 kg)  SpO2 98%   BMI 17.76 kg/m   GEN: A/Ox3; pleasant , NAD, thin and frail on O2    HEENT:  [place]/AT,  EACs-clear, TMs-wnl, NOSE-clear, THROAT-clear, no lesions, no postnasal drip or exudate noted.   NECK:  Supple w/ fair ROM; no JVD; normal carotid impulses w/o bruits; no thyromegaly or nodules palpated; no lymphadenopathy.    RESP  Decreased BS in bases ,  no accessory muscle use, no dullness to percussion  CARD:  RRR, no m/r/g, no peripheral edema, pulses intact, no cyanosis or clubbing.  GI:   Soft & nt; nml bowel sounds; no organomegaly or masses detected.   Musco: Warm bil, no deformities or joint swelling noted.   Neuro: alert, no focal deficits noted.    Skin: Warm, no lesions or rashes    Lab Results:  CBC  BMET  Imaging: Dg Bone Density  Result Date: 01/17/2017 EXAM: DUAL X-RAY ABSORPTIOMETRY (DXA) FOR BONE MINERAL DENSITY IMPRESSION: Referring Physician:  Mosie Lukes PATIENT: Name: Ladonya, Jerkins Patient ID: [id number] Birth Date: 1941-09-20 Height: 61.0 in. Sex: Female Measured: 01/17/2017 Weight: 97.4 lbs. Indications: Caucasian, Hysterectomy, Oophorectomy ( Bilateral) Fractures: None Treatments: Calcium, Prednisone, Vitamin D ASSESSMENT: The BMD measured at Femur Neck Right is 0.543 g/cm2 with a T-score of -3.6. This patient is considered OSTEOPOROTIC according to [facility]) criteria. Site Region Measured Date Measured Age  WHO YA BMD Classification T-score AP Spine L1-L4 01/17/2017 74.6 Osteoporosis -2.5 0.875 g/cm2 DualFemur Neck Right 2/17/2017 74.6 years Osteoporosis -3.6 0.543 g/cm2 World Health Organization [facility]) criteria for post-menopausal, Caucasian Women: Normal       T-score at or above -1 SD Osteopenia   T-score between -1 and -2.5 SD Osteoporosis T-score at or below -2.5 SD RECOMMENDATION: [place] recommends that FDA-approved medical therapies be considered in postmenopausal women and men age 26 or older with a: 1. Hip or vertebral (clinical or morphometric) fracture. 2. T-score of < -2.5 at the spine or hip. 3. Ten-year fracture probability by FRAX of 3% or greater for hip fracture or 20% or greater for major osteoporotic fracture. All treatment decisions require clinical judgment and consideration of individual patient factors, including patient preferences, co-morbidities, previous drug use, risk factors not captured in the FRAX model (e.g. falls, vitamin D deficiency, increased bone turnover, interval significant decline in bone density) and possible under - or over-estimation of fracture risk by FRAX. All patients should ensure an adequate intake of dietary calcium (1200 mg/d) and vitamin D (800 IU daily) unless contraindicated, and avoid modifiable risk factors. Additional treatment may be appropriate if there are other significant clinical risk factors. FOLLOW-UP: People with diagnosed cases of osteoporosis or at high risk for fracture should have regular bone mineral density tests. For patients eligible for Medicare, routine testing is allowed once every 2 years. The testing frequency can be increased to one year for patients who have rapidly progressing disease, those who are receiving or discontinuing medical therapy to restore bone mass, or have additional risk factors. I have reviewed this report and agree with the above findings. Mark A. Thornton Papas, M.D. [facility]iology Electronically  Signed   By: Lavonia Dana M.D.   On: 01/17/2017 16:20     Assessment & Plan:   Chronic respiratory failure Cont on o2   COPD mixed type (HCC) Exacerbation  Check cxr   Plan  Patient Instructions  Prednisone taper as directed then hold at 10mg  daily .  Doxycycline 100mg  Twice daily  For  1 week , use sunscreen if outside.  Chest xray today .  Continue on Brovana and Budesonide Neb Twice daily  .  Continue on Ipratropium Neb Four times a day  .  Continue on 2 L rest and 3l/m activity .  Follow up with Dr. Elsworth Soho  In May as planned and As needed   Please contact office for sooner follow up if symptoms do not improve or worsen or seek emergency care  Refer to cardiology .     Chest pain of uncertain etiology Intermittent chest pain /tightness for last 3-4 months , increased risk factors. No current sx.  Refer to cardiology      Rexene Edison, NP 02/07/2017

## 2017-02-07 NOTE — Assessment & Plan Note (Signed)
Exacerbation  Check cxr   Plan  Patient Instructions  Prednisone taper as directed then hold at 10mg  daily .  Doxycycline 100mg  Twice daily  For 1 week , use sunscreen if outside.  Chest xray today .  Continue on Brovana and Budesonide Neb Twice daily  .  Continue on Ipratropium Neb Four times a day  .  Continue on 2 L rest and 3l/m activity .  Follow up with Dr. Elsworth Soho  In May as planned and As needed   Please contact office for sooner follow up if symptoms do not improve or worsen or seek emergency care  Refer to cardiology .

## 2017-02-07 NOTE — Patient Instructions (Addendum)
Prednisone taper as directed then hold at 10mg  daily .  Doxycycline 100mg  Twice daily  For 1 week , use sunscreen if outside.  Chest xray today .  Continue on Brovana and Budesonide Neb Twice daily  .  Continue on Ipratropium Neb Four times a day  .  Continue on 2 L rest and 3l/m activity .  Follow up with Dr. Elsworth Soho  In May as planned and As needed   Please contact office for sooner follow up if symptoms do not improve or worsen or seek emergency care  Refer to cardiology .

## 2017-02-07 NOTE — Assessment & Plan Note (Signed)
Intermittent chest pain /tightness for last 3-4 months , increased risk factors. No current sx.  Refer to cardiology

## 2017-02-08 NOTE — Progress Notes (Signed)
Reviewed & agree with plan  

## 2017-02-11 ENCOUNTER — Other Ambulatory Visit: Payer: Self-pay | Admitting: Family Medicine

## 2017-02-11 NOTE — Progress Notes (Signed)
Referring-Parrett, Fonnie Mu, NP Reason for referral-Chest pain  HPI: 76 yo female with PMH COPD and HTN for evaluation of chest tightness at request of Tammy Parrett. Chest x-ray April 2018 showed emphysema. Dobutamine nuclear study September 2009 normal. Patient states that for the past 2 months she has had occasional pain in her lower chest area and also occasionally in her left upper extremity. It can last all day. It is not pruritic, positional or exertional. She feels it may be related to her lung disease. No associated symptoms. Resolves spontaneously. She has chronic dyspnea on exertion but no orthopnea, PND, pedal edema, syncope or exertional chest pain.  Current Outpatient Prescriptions  Medication Sig Dispense Refill  . albuterol (PROVENTIL HFA;VENTOLIN HFA) 108 (90 BASE) MCG/ACT inhaler Inhale 2 puffs into the lungs every 6 (six) hours as needed for wheezing or shortness of breath. 3 Inhaler 3  . albuterol (PROVENTIL) (2.5 MG/3ML) 0.083% nebulizer solution Take 3 mLs (2.5 mg total) by nebulization every 4 (four) hours as needed for wheezing or shortness of breath. Dx 496 300 mL 0  . ALPRAZolam (XANAX) 0.5 MG tablet Take 1 tablet (0.5 mg total) by mouth 2 (two) times daily as needed for anxiety. 180 tablet 1  . arformoterol (BROVANA) 15 MCG/2ML NEBU Take 2 mLs (15 mcg total) by nebulization 2 (two) times daily. 360 mL 3  . budesonide (PULMICORT) 0.25 MG/2ML nebulizer solution Take 2 mLs (0.25 mg total) by nebulization 2 (two) times daily. Dx: J44.9 360 mL 3  . Calcium Carbonate-Vitamin D 600-400 MG-UNIT per tablet Take 1 tablet by mouth 2 (two) times daily.      . citalopram (CELEXA) 20 MG tablet Take 1 tablet (20 mg total) by mouth daily. 30 tablet 2  . doxycycline (VIBRA-TABS) 100 MG tablet Take 1 tablet (100 mg total) by mouth 2 (two) times daily. 14 tablet 0  . Ferrous Fumarate-Folic Acid (HEMOCYTE-F) 324-1 MG TABS Take 1 tablet by mouth daily. 30 each 5  . fluticasone (FLONASE) 50  MCG/ACT nasal spray Place 2 sprays into both nostrils 2 (two) times daily as needed.     . Glucosamine-Chondroitin (GLUCOSAMINE CHONDR COMPLEX PO) Take 1 tablet by mouth 2 (two) times daily.     Marland Kitchen ipratropium (ATROVENT) 0.02 % nebulizer solution Take 2.5 mLs (0.5 mg total) by nebulization 4 (four) times daily. 900 mL 3  . metoprolol succinate (TOPROL-XL) 25 MG 24 hr tablet Take 1 tablet (25 mg total) by mouth daily. 90 tablet 3  . mirtazapine (REMERON) 15 MG tablet Take 1 tablet (15 mg total) by mouth at bedtime. 30 tablet 1  . ondansetron (ZOFRAN) 4 MG tablet Take 1 tablet (4 mg total) by mouth every 8 (eight) hours as needed for nausea or vomiting. 9 tablet 0  . predniSONE (DELTASONE) 10 MG tablet Take 1 tablet (10 mg total) by mouth daily. 90 tablet 3  . predniSONE (DELTASONE) 10 MG tablet 4 tabs for 2 days, then 3 tabs for 2 days, 2 tabs for 2 days, then 1 tab daily 20 tablet 0  . ranitidine (ZANTAC) 300 MG capsule Take 1 capsule (300 mg total) by mouth every evening. 90 capsule 1   No current facility-administered medications for this visit.     Allergies  Allergen Reactions  . Morphine Other (See Comments)    REACTION: nightmares  . Ambien [Zolpidem] Anxiety  . Cymbalta [Duloxetine Hcl] Anxiety  . Nitrofurantoin Rash     Past Medical History:  Diagnosis Date  .  Anxiety   . Arthralgia   . Atrophic vaginitis 12/07/2012  . Chest pain   . Clostridium difficile diarrhea 12/27/2014  . COPD (chronic obstructive pulmonary disease) ([place])   . Depression   . GERD (gastroesophageal reflux disease)   . Glucose intolerance (impaired glucose tolerance)   . History of small bowel obstruction   . Hx of adenomatous colonic polyps   . Hyperglycemia 12/01/2013  . Hypertension   . Ischemic colitis ([place])   . Medicare annual wellness visit, subsequent 01/12/2016  . Migraine   . Nocturia 09/02/2013  . Osteoporosis   . Thyroid disease   . Weight loss     Past Surgical History:  Procedure  Laterality Date  . ABDOMINAL HYSTERECTOMY  1993  . BLADDER REPAIR    . CHOLECYSTECTOMY    . COLONOSCOPY WITH PROPOFOL N/A 06/08/2013   Procedure: COLONOSCOPY WITH PROPOFOL;  Surgeon: Lafayette Dragon, MD;  Location: [facility];  Service: Endoscopy;  Laterality: N/A;  . DILATION AND CURETTAGE OF UTERUS     40 yrs ago  . EYE SURGERY  04/2011   bil. eyes  . TUBAL LIGATION      Social History   Social History  . Marital status: Married    Spouse name: N/A  . Number of children: 1  . Years of education: N/A   Occupational History  . Retired Hydrologist Retired   Social History Main Topics  . Smoking status: Former Smoker    Packs/day: 2.00    Years: 40.00    Types: Cigarettes    Quit date: 10/29/2001  . Smokeless tobacco: Never Used  . Alcohol use No  . Drug use: No  . Sexual activity: Not on file   Other Topics Concern  . Not on file   Social History Narrative  . No narrative on file    Family History  Problem Relation Age of Onset  . Alcohol abuse Mother   . Heart disease Mother   . Stroke Mother   . Hypertension Mother   . Kidney disease Mother   . COPD Mother   . Osteoporosis Mother     hip fracture  . COPD Brother   . Alcohol abuse Brother     drug abuse  . Hearing loss Brother     mvp  . Alcohol abuse Father     MVA  . Thyroid disease Daughter   . COPD Paternal Grandfather   . COPD Sister   . Hypertension Sister   . Anxiety disorder Sister   . COPD Sister   . COPD Brother     current smoker  . Hearing loss Brother     cad  . COPD Brother   . Colon cancer Paternal Aunt   . Prostate cancer      ROS: no fevers or chills, productive cough, hemoptysis, dysphasia, odynophagia, melena, hematochezia, dysuria, hematuria, rash, seizure activity, orthopnea, PND, pedal edema, claudication. Remaining systems are negative.  Physical Exam:   Blood pressure (!) 163/79, pulse 81, height 5\' 1"  (1.549 m), weight 95 lb 1.9 oz (43.1  kg).  General:  Well developed/thin in NAD Skin warm/dry Patient not depressed Back-normal HEENT-normal/normal eyelids Neck supple/normal carotid upstroke bilaterally; no bruits; no JVD; no thyromegaly chest - Diminished BS throughout CV - Distant heart sounds; RRR/normal S1 and S2; no murmurs, rubs or gallops;  PMI nondisplaced Abdomen -NT/ND, no HSM, no mass, + bowel sounds, no bruit No femoral bruits Ext-no edema, chords, 2+ DP Neuro-grossly nonfocal  ECG - Sinus rhythm at a rate of 81. Right axis deviation. Right atrial enlargement. No ST changes. personally reviewed  A/P  1 chest pain-symptoms are atypical. Possibly secondary to lung disease. I will arrange a dobutamine nuclear study for risk stratification.  2 hypertension-blood pressure is elevated. I have asked her to track this at home and additional medications can be added as needed.  3 severe COPD-management per pulmonary.  Kirk Ruths, MD

## 2017-02-12 MED ORDER — CITALOPRAM HYDROBROMIDE 20 MG PO TABS: 20.0000 mg | ORAL_TABLET | Freq: Every day | ORAL | 2 refills | Status: DC

## 2017-02-15 NOTE — Progress Notes (Signed)
Called spoke with patient, advised of cxr results / recs as stated by TP.  Pt verbalized her understanding and denied any questions. 

## 2017-02-18 DIAGNOSIS — M6281 Muscle weakness (generalized): Secondary | ICD-10-CM | POA: Diagnosis not present

## 2017-02-18 DIAGNOSIS — R0602 Shortness of breath: Secondary | ICD-10-CM | POA: Diagnosis not present

## 2017-02-18 DIAGNOSIS — J9611 Chronic respiratory failure with hypoxia: Secondary | ICD-10-CM | POA: Diagnosis not present

## 2017-02-18 DIAGNOSIS — J449 Chronic obstructive pulmonary disease, unspecified: Secondary | ICD-10-CM | POA: Diagnosis not present

## 2017-02-20 ENCOUNTER — Ambulatory Visit (INDEPENDENT_AMBULATORY_CARE_PROVIDER_SITE_OTHER): Payer: Medicare PPO | Admitting: Cardiology

## 2017-02-20 ENCOUNTER — Encounter: Payer: Self-pay | Admitting: Cardiology

## 2017-02-20 VITALS — BP 163/79 | HR 81 | Ht 61.0 in | Wt 95.1 lb

## 2017-02-20 DIAGNOSIS — R072 Precordial pain: Secondary | ICD-10-CM

## 2017-02-20 DIAGNOSIS — I10 Essential (primary) hypertension: Secondary | ICD-10-CM | POA: Diagnosis not present

## 2017-02-20 NOTE — Patient Instructions (Signed)
Medication Instructions:   NO CHANGE  Testing/Procedures:  Your physician has requested that you have a dobutamine myoview. For furth information please visit [email or web address]. Please follow instruction sheet, as given.    Follow-Up:  Your physician recommends that you schedule a follow-up appointment in: AS NEEDED PENDING TEST RESULTS

## 2017-02-26 ENCOUNTER — Telehealth (HOSPITAL_COMMUNITY): Payer: Self-pay

## 2017-02-26 NOTE — Telephone Encounter (Signed)
Encounter complete. 

## 2017-02-27 ENCOUNTER — Telehealth: Payer: Self-pay | Admitting: Adult Health

## 2017-02-27 NOTE — Telephone Encounter (Signed)
TP  Please Advise-  Pt called and stated that she wanted to speak with you. When I inquired about what this was in regards to pt stated she only wants to discuss this with you.

## 2017-02-27 NOTE — Telephone Encounter (Signed)
Done

## 2017-02-28 ENCOUNTER — Ambulatory Visit (HOSPITAL_COMMUNITY)
Admission: RE | Admit: 2017-02-28 | Discharge: 2017-02-28 | Disposition: A | Discharge status: Home / Self Care | Payer: Medicare PPO | Source: Ambulatory Visit | Attending: Cardiology | Admitting: Cardiology

## 2017-02-28 ENCOUNTER — Other Ambulatory Visit: Payer: Self-pay | Admitting: Family Medicine

## 2017-02-28 ENCOUNTER — Telehealth: Payer: Self-pay | Admitting: Family Medicine

## 2017-02-28 DIAGNOSIS — R0789 Other chest pain: Secondary | ICD-10-CM | POA: Diagnosis not present

## 2017-02-28 DIAGNOSIS — I10 Essential (primary) hypertension: Secondary | ICD-10-CM | POA: Diagnosis not present

## 2017-02-28 DIAGNOSIS — R0609 Other forms of dyspnea: Secondary | ICD-10-CM | POA: Diagnosis not present

## 2017-02-28 LAB — MYOCARDIAL PERFUSION IMAGING
CHL CUP NUCLEAR SDS: 7
CHL CUP NUCLEAR SSS: 11
LV dias vol: 42 mL (ref 46–106)
LVSYSVOL: 9 mL
NUC STRESS TID: 0.75
SRS: 4

## 2017-02-28 MED ORDER — TECHNETIUM TC 99M TETROFOSMIN IV KIT
30.9000 | PACK | Freq: Once | INTRAVENOUS | Status: AC | PRN
  Administered 2017-02-28: 30.9 via INTRAVENOUS
  Filled 2017-02-28: qty 31

## 2017-02-28 MED ORDER — TECHNETIUM TC 99M TETROFOSMIN IV KIT
10.3000 | PACK | Freq: Once | INTRAVENOUS | Status: AC | PRN
  Administered 2017-02-28: 10.3 via INTRAVENOUS
  Filled 2017-02-28: qty 11

## 2017-02-28 MED ORDER — SODIUM CHLORIDE 0.9 % IV SOLN
10.0000 ug/kg/min | INTRAVENOUS | Status: DC
  Administered 2017-02-28: 30 ug/kg/min via INTRAVENOUS

## 2017-02-28 NOTE — Telephone Encounter (Signed)
Patient informed of results She declines this time as has too much going on right now.

## 2017-02-28 NOTE — Telephone Encounter (Signed)
Received verification of [employer] benefits    PA is required- Form printed and give to CMA to complete and fax in  [employer] is suject to 20% co-insurance $35 copay will cover Admin and OV if billed  Patient may owe approximately $245 out of pocket

## 2017-03-06 ENCOUNTER — Other Ambulatory Visit (INDEPENDENT_AMBULATORY_CARE_PROVIDER_SITE_OTHER): Payer: Medicare PPO

## 2017-03-06 DIAGNOSIS — D649 Anemia, unspecified: Secondary | ICD-10-CM | POA: Diagnosis not present

## 2017-03-06 LAB — COMPREHENSIVE METABOLIC PANEL
ALK PHOS: 40 U/L (ref 39–117)
ALT: 17 U/L (ref 0–35)
AST: 22 U/L (ref 0–37)
Albumin: 4 g/dL (ref 3.5–5.2)
BUN: 18 mg/dL (ref 6–23)
CHLORIDE: 95 meq/L — AB (ref 96–112)
CO2: 44 meq/L — AB (ref 19–32)
Calcium: 9.4 mg/dL (ref 8.4–10.5)
Creatinine, Ser: 0.55 mg/dL (ref 0.40–1.20)
GFR: 114.63 mL/min (ref >60.00)
GLUCOSE: 94 mg/dL (ref 70–99)
POTASSIUM: 3.7 meq/L (ref 3.5–5.1)
SODIUM: 142 meq/L (ref 135–145)
TOTAL PROTEIN: 6.4 g/dL (ref 6.0–8.3)
Total Bilirubin: 0.3 mg/dL (ref 0.2–1.2)

## 2017-03-06 LAB — LIPID PANEL
CHOL/HDL RATIO: 2
CHOLESTEROL: 174 mg/dL (ref 0–200)
HDL: 78.3 mg/dL (ref >39.00)
NonHDL: 95.21
TRIGLYCERIDES: 269 mg/dL — AB (ref 0.0–149.0)
VLDL: 53.8 mg/dL — ABNORMAL HIGH (ref 0.0–40.0)

## 2017-03-06 LAB — LDL CHOLESTEROL, DIRECT: Direct LDL: 58 mg/dL

## 2017-03-06 LAB — TSH: TSH: 2.72 u[IU]/mL (ref 0.35–4.50)

## 2017-03-06 LAB — VITAMIN D 25 HYDROXY (VIT D DEFICIENCY, FRACTURES): VITD: 62.92 ng/mL (ref 30.00–100.00)

## 2017-03-06 LAB — HEMOGLOBIN A1C: Hgb A1c MFr Bld: 6.3 % (ref 4.6–6.5)

## 2017-03-07 ENCOUNTER — Ambulatory Visit: Payer: Medicare PPO | Admitting: Pulmonary Disease

## 2017-03-09 DIAGNOSIS — R0602 Shortness of breath: Secondary | ICD-10-CM | POA: Diagnosis not present

## 2017-03-12 ENCOUNTER — Other Ambulatory Visit: Payer: Medicare PPO

## 2017-03-12 ENCOUNTER — Telehealth: Payer: Self-pay | Admitting: Family Medicine

## 2017-03-12 NOTE — Telephone Encounter (Signed)
Called patient to schedule awv. Lvm for patient to call office to schedule appt.  °

## 2017-03-15 ENCOUNTER — Encounter: Payer: Self-pay | Admitting: Pulmonary Disease

## 2017-03-15 ENCOUNTER — Ambulatory Visit (INDEPENDENT_AMBULATORY_CARE_PROVIDER_SITE_OTHER): Payer: Medicare PPO | Admitting: Pulmonary Disease

## 2017-03-15 DIAGNOSIS — J9611 Chronic respiratory failure with hypoxia: Secondary | ICD-10-CM | POA: Diagnosis not present

## 2017-03-15 DIAGNOSIS — J449 Chronic obstructive pulmonary disease, unspecified: Secondary | ICD-10-CM

## 2017-03-15 NOTE — Assessment & Plan Note (Signed)
Stay on 5 mg prednisone. Stay on Pulmicort and Brovana combination with atrovent nebs

## 2017-03-15 NOTE — Assessment & Plan Note (Signed)
Ct O2 

## 2017-03-15 NOTE — Progress Notes (Signed)
   Subjective:    Patient ID: [name], female    DOB: 1941/02/17, 76 y.o.   MRN: [id number]  HPI  14 yowf , heavy ex- smoker, quit 2002 with GOLD D COPD FEV1 26% 03/2009 on 24 h O2 since 2009.   Recurrent flares 2-3/yr requiring steroid bursts, steroid dependent 5mg since 2014 She uses 3 liters 24/7. On a regimen of budesonide/brovana & alb/atrovent nebs    Chief Complaint  Patient presents with  . Follow-up    FOLLOW UP FOR copd mixed type patient has completed all of the doxycyline that she was given she states that she is doing much better    Seen in 01/2017 for COPD flare and given doxycycline and prednisone She feels improved and back to her baseline, is now down to 5 mg of prednisone She Underwent 2 infusions of stem cell in 2017 but did not get much benefit from this  She is compliant with oxygen and with Pulmicort and Brovana regimen. She was able to make a trip to [place] for Mother's Day She denies pedal edema or nocturnal wheezing Significant tests/ events  Admitted 12/2012 for COPD exacerbation .ABG 7.39/55/75 on 3L [place]   08/2013 BL pneumonia  -completed pulm rehab 2011   03/2014 ONO on 2L >> no sig desatn  CT chest 10/2014 neg PE  12/06/2014 >>2.5 L at rest , 3L on exertion - ONO ok in the past  2016 c diff   Review of Systems Patient denies significant dyspnea,cough, hemoptysis,  chest pain, palpitations, pedal edema, orthopnea, paroxysmal nocturnal dyspnea, lightheadedness, nausea, vomiting, abdominal or  leg pains      Objective:   Physical Exam  Gen. Pleasant, thin, in no distress, on O2 [place] [facility] - no thrush, no post nasal drip Neck: No JVD, no thyromegaly, no carotid bruits Lungs: no use of accessory muscles, no dullness to percussion, decreased without rales or rhonchi  Cardiovascular: Rhythm regular, heart sounds  normal, no murmurs or gallops, no peripheral edema Musculoskeletal: No deformities, no cyanosis or clubbing          Assessment & Plan:

## 2017-03-15 NOTE — Patient Instructions (Signed)
Stay on 5 mg prednisone. Stay on Pulmicort and Brovana combination

## 2017-03-18 ENCOUNTER — Telehealth: Payer: Self-pay | Admitting: *Deleted

## 2017-03-18 NOTE — Telephone Encounter (Signed)
Called patient and left message to return call

## 2017-03-19 ENCOUNTER — Ambulatory Visit (INDEPENDENT_AMBULATORY_CARE_PROVIDER_SITE_OTHER): Payer: Medicare PPO | Admitting: Family Medicine

## 2017-03-19 ENCOUNTER — Encounter: Payer: Self-pay | Admitting: Family Medicine

## 2017-03-19 VITALS — BP 130/46 | HR 88 | Temp 97.9°F | Wt 92.4 lb

## 2017-03-19 DIAGNOSIS — J449 Chronic obstructive pulmonary disease, unspecified: Secondary | ICD-10-CM | POA: Diagnosis not present

## 2017-03-19 DIAGNOSIS — I10 Essential (primary) hypertension: Secondary | ICD-10-CM | POA: Diagnosis not present

## 2017-03-19 DIAGNOSIS — E785 Hyperlipidemia, unspecified: Secondary | ICD-10-CM

## 2017-03-19 DIAGNOSIS — A0472 Enterocolitis due to Clostridium difficile, not specified as recurrent: Secondary | ICD-10-CM | POA: Diagnosis not present

## 2017-03-19 DIAGNOSIS — R739 Hyperglycemia, unspecified: Secondary | ICD-10-CM | POA: Diagnosis not present

## 2017-03-19 DIAGNOSIS — M810 Age-related osteoporosis without current pathological fracture: Secondary | ICD-10-CM | POA: Diagnosis not present

## 2017-03-19 DIAGNOSIS — E782 Mixed hyperlipidemia: Secondary | ICD-10-CM | POA: Diagnosis not present

## 2017-03-19 HISTORY — DX: Hyperlipidemia, unspecified: E78.5

## 2017-03-19 NOTE — Patient Instructions (Addendum)
Encouraged to incorporate Flaxseed tablets into your daily regimen. Chronic Obstructive Pulmonary Disease Chronic obstructive pulmonary disease (COPD) is a long-term (chronic) lung problem. When you have COPD, it is hard for air to get in and out of your lungs. The way your lungs work will never return to normal. Usually the condition gets worse over time. There are things you can do to keep yourself as healthy as possible. Your doctor may treat your condition with:  Medicines.  Quitting smoking, if you smoke.  Rehabilitation. This may involve a team of specialists.  Oxygen.  Exercise and changes to your diet.  Lung surgery.  Comfort measures (palliative care). Follow these instructions at home: Medicines   Take over-the-counter and prescription medicines only as told by your doctor.  Talk to your doctor before taking any cough or allergy medicines. You may need to avoid medicines that cause your lungs to be dry. Lifestyle   If you smoke, stop. Smoking makes the problem worse. If you need help quitting, ask your doctor.  Avoid being around things that make your breathing worse. This may include smoke, chemicals, and fumes.  Stay active, but remember to also rest.  Learn and use tips on how to relax.  Make sure you get enough sleep. Most adults need at least 7 hours a night.  Eat healthy foods. Eat smaller meals more often. Rest before meals. Controlled breathing   Learn and use tips on how to control your breathing as told by your doctor. Try:  Breathing in (inhaling) through your nose for 1 second. Then, pucker your lips and breath out (exhale) through your lips for 2 seconds.  Putting one hand on your belly (abdomen). Breathe in slowly through your nose for 1 second. Your hand on your belly should move out. Pucker your lips and breathe out slowly through your lips. Your hand on your belly should move in as you breathe out. Controlled coughing   Learn and use controlled  coughing to clear mucus from your lungs. The steps are: 1. Lean your head a little forward. 2. Breathe in deeply. 3. Try to hold your breath for 3 seconds. 4. Keep your mouth slightly open while coughing 2 times. 5. Spit any mucus out into a tissue. 6. Rest and do the steps again 1 or 2 times as needed. General instructions   Make sure you get all the shots (vaccines) that your doctor recommends. Ask your doctor about a flu shot and a pneumonia shot.  Use oxygen therapy and therapy to help improve your lungs (pulmonary rehabilitation) if told by your doctor. If you need home oxygen therapy, ask your doctor if you should buy a tool to measure your oxygen level (oximeter).  Make a COPD action plan with your doctor. This helps you know what to do if you feel worse than usual.  Manage any other conditions you have as told by your doctor.  Avoid going outside when it is very hot, cold, or humid.  Avoid people who have a sickness you can catch (contagious).  Keep all follow-up visits as told by your doctor. This is important. Contact a doctor if:  You cough up more mucus than usual.  There is a change in the color or thickness of the mucus.  It is harder to breathe than usual.  Your breathing is faster than usual.  You have trouble sleeping.  You need to use your medicines more often than usual.  You have trouble doing your normal activities such  as getting dressed or walking around the house. Get help right away if:  You have shortness of breath while resting.  You have shortness of breath that stops you from:  Being able to talk.  Doing normal activities.  Your chest hurts for longer than 5 minutes.  Your skin color is more blue than usual.  Your pulse oximeter shows that you have low oxygen for longer than 5 minutes.  You have a fever.  You feel too tired to breathe normally. Summary  Chronic obstructive pulmonary disease (COPD) is a long-term lung  problem.  The way your lungs work will never return to normal. Usually the condition gets worse over time. There are things you can do to keep yourself as healthy as possible.  Take over-the-counter and prescription medicines only as told by your doctor.  If you smoke, stop. Smoking makes the problem worse. This information is not intended to replace advice given to you by your health care provider. Make sure you discuss any questions you have with your health care provider. Document Released: 04/02/2008 Document Revised: 03/22/2016 Document Reviewed: 06/11/2013 Elsevier Interactive Patient Education  2017 [employer].

## 2017-03-19 NOTE — Progress Notes (Signed)
Pre visit review using our clinic review tool, if applicable. No additional management support is needed unless otherwise documented below in the visit note. 

## 2017-03-19 NOTE — Assessment & Plan Note (Signed)
Avoid offending foods, consider probiotics. Do not eat large meals in late evening and consider raising head of bed.

## 2017-03-19 NOTE — Assessment & Plan Note (Signed)
Encouraged heart healthy diet, increase exercise, avoid trans fats, consider a krill oil cap daily 

## 2017-03-19 NOTE — Assessment & Plan Note (Signed)
hgba1c acceptable but did increase, minimize simple carbs. Increase exercise as tolerated. Continue current meds

## 2017-03-19 NOTE — Progress Notes (Signed)
Patient ID: [name], female   DOB: May 08, 1941, 76 y.o.   MRN: [id number]   Subjective:  I acted as a Education administrator for Penni Homans, [place], [place]   Patient ID: [name], female    DOB: August 13, 1941, 76 y.o.   MRN: [id number]  Chief Complaint  Patient presents with  . COPD    56-month F/U.     HPI  Patient is in today for a 3-month follow up of COPD. Patient wants to discuss Prolia. Patient has a Hx of mixed type COPD, HTN, chronic respiratory failure, hypothyroidism, osteoporosis. Patient has no additional concerns noted at this time. No recent hospitalizations or acute febrile illness. Just returned from a trip to [place] and did very well despite her severe COPD. No flare in her shortness of breath, remains at her baseline level and uses O2 continuously. She is struggling with distress of her brother being diagnosed with prostate cancer but overall is tolerating that well and offers no acute concerns. Denies CP/palp/HA/congestion/fevers/GI or GU c/o. Taking meds as prescribed  Patient Care Team: Mosie Lukes, MD as PCP - General (Family Medicine)   Past Medical History:  Diagnosis Date  . Anxiety   . Arthralgia   . Atrophic vaginitis 12/07/2012  . Chest pain   . Clostridium difficile diarrhea 12/27/2014  . COPD (chronic obstructive pulmonary disease) ([facility])   . Depression   . GERD (gastroesophageal reflux disease)   . Glucose intolerance (impaired glucose tolerance)   . History of small bowel obstruction   . Hx of adenomatous colonic polyps   . Hyperglycemia 12/01/2013  . Hyperlipidemia 03/19/2017  . Hypertension   . Ischemic colitis ([facility])   . Medicare annual wellness visit, subsequent 01/12/2016  . Migraine   . Nocturia 09/02/2013  . Osteoporosis   . Thyroid disease   . Weight loss     Past Surgical History:  Procedure Laterality Date  . ABDOMINAL HYSTERECTOMY  1993  . BLADDER REPAIR    . CHOLECYSTECTOMY    . COLONOSCOPY WITH PROPOFOL N/A 06/08/2013   Procedure: COLONOSCOPY WITH PROPOFOL;  Surgeon: Lafayette Dragon, MD;  Location: [facility];  Service: Endoscopy;  Laterality: N/A;  . DILATION AND CURETTAGE OF UTERUS     40 yrs ago  . EYE SURGERY  04/2011   bil. eyes  . TUBAL LIGATION      Family History  Problem Relation Age of Onset  . Alcohol abuse Mother   . Heart disease Mother   . Stroke Mother   . Hypertension Mother   . Kidney disease Mother   . COPD Mother   . Osteoporosis Mother        hip fracture  . COPD Brother   . Alcohol abuse Brother        drug abuse  . Hearing loss Brother        mvp  . Alcohol abuse Father        MVA  . Thyroid disease Daughter   . COPD Paternal Grandfather   . COPD Sister   . Hypertension Sister   . Anxiety disorder Sister   . COPD Sister   . COPD Brother        current smoker  . Hearing loss Brother        cad  . Prostate cancer Brother   . COPD Brother   . Colon cancer Paternal Aunt   . Prostate cancer Unknown     Social History   Social History  .  Marital status: Married    Spouse name: N/A  . Number of children: 1  . Years of education: N/A   Occupational History  . Retired Hydrologist Retired   Social History Main Topics  . Smoking status: Former Smoker    Packs/day: 2.00    Years: 40.00    Types: Cigarettes    Quit date: 10/29/2001  . Smokeless tobacco: Never Used  . Alcohol use No  . Drug use: No  . Sexual activity: Not on file   Other Topics Concern  . Not on file   Social History Narrative  . No narrative on file    Outpatient Medications Prior to Visit  Medication Sig Dispense Refill  . albuterol (PROVENTIL HFA;VENTOLIN HFA) 108 (90 BASE) MCG/ACT inhaler Inhale 2 puffs into the lungs every 6 (six) hours as needed for wheezing or shortness of breath. 3 Inhaler 3  . albuterol (PROVENTIL) (2.5 MG/3ML) 0.083% nebulizer solution Take 3 mLs (2.5 mg total) by nebulization every 4 (four) hours as needed for wheezing or shortness of breath.  Dx 496 300 mL 0  . ALPRAZolam (XANAX) 0.5 MG tablet Take 1 tablet (0.5 mg total) by mouth 2 (two) times daily as needed for anxiety. 180 tablet 1  . arformoterol (BROVANA) 15 MCG/2ML NEBU Take 2 mLs (15 mcg total) by nebulization 2 (two) times daily. 360 mL 3  . budesonide (PULMICORT) 0.25 MG/2ML nebulizer solution Take 2 mLs (0.25 mg total) by nebulization 2 (two) times daily. Dx: J44.9 360 mL 3  . Calcium Carbonate-Vitamin D 600-400 MG-UNIT per tablet Take 1 tablet by mouth 2 (two) times daily.      . citalopram (CELEXA) 20 MG tablet Take 1 tablet (20 mg total) by mouth daily. 30 tablet 2  . citalopram (CELEXA) 20 MG tablet TAKE 1 TABLET(20 MG) BY MOUTH DAILY 30 tablet 5  . Ferrous Fumarate-Folic Acid (HEMOCYTE-F) 324-1 MG TABS Take 1 tablet by mouth daily. 30 each 5  . fluticasone (FLONASE) 50 MCG/ACT nasal spray Place 2 sprays into both nostrils 2 (two) times daily as needed.     . Glucosamine-Chondroitin (GLUCOSAMINE CHONDR COMPLEX PO) Take 1 tablet by mouth 2 (two) times daily.     Marland Kitchen ipratropium (ATROVENT) 0.02 % nebulizer solution Take 2.5 mLs (0.5 mg total) by nebulization 4 (four) times daily. 900 mL 3  . metoprolol succinate (TOPROL-XL) 25 MG 24 hr tablet Take 1 tablet (25 mg total) by mouth daily. 90 tablet 3  . mirtazapine (REMERON) 15 MG tablet Take 1 tablet (15 mg total) by mouth at bedtime. 30 tablet 1  . ondansetron (ZOFRAN) 4 MG tablet Take 1 tablet (4 mg total) by mouth every 8 (eight) hours as needed for nausea or vomiting. 9 tablet 0  . predniSONE (DELTASONE) 10 MG tablet Take 1 tablet (10 mg total) by mouth daily. 90 tablet 3  . ranitidine (ZANTAC) 300 MG capsule Take 1 capsule (300 mg total) by mouth every evening. 90 capsule 1   No facility-administered medications prior to visit.     Allergies  Allergen Reactions  . Morphine Other (See Comments)    REACTION: nightmares  . Ambien [Zolpidem] Anxiety  . Cymbalta [Duloxetine Hcl] Anxiety  . Nitrofurantoin Rash     Review of Systems  Constitutional: Negative for fever and malaise/fatigue.  HENT: Negative for congestion.   Eyes: Negative for blurred vision.  Respiratory: Positive for shortness of breath. Negative for cough.   Cardiovascular: Negative for chest pain, palpitations and leg  swelling.  Gastrointestinal: Negative for vomiting.  Musculoskeletal: Negative for back pain.  Skin: Negative for rash.  Neurological: Negative for loss of consciousness and headaches.        Objective:    Physical Exam  Constitutional: She is oriented to person, place, and time. She appears well-developed and well-nourished. No distress.  HENT:  Head: Normocephalic and atraumatic.  Eyes: Conjunctivae are normal.  Neck: Normal range of motion. No thyromegaly present.  Cardiovascular: Normal rate and regular rhythm.   Pulmonary/Chest: Effort normal and breath sounds normal. She has no wheezes.  Abdominal: Soft. Bowel sounds are normal. There is no tenderness.  Musculoskeletal: She exhibits no edema or deformity.  Neurological: She is alert and oriented to person, place, and time.  Skin: Skin is warm and dry. She is not diaphoretic.  Psychiatric: She has a normal mood and affect.    BP (!) 130/46 (BP Location: Left Arm, Patient Position: Sitting, Cuff Size: Normal)   Pulse 88   Temp 97.9 F (36.6 C) (Oral)   Wt 92 lb 6.4 oz (41.9 kg)   SpO2 100% Comment: O2  BMI 17.46 kg/m  Wt Readings from Last 3 Encounters:  03/19/17 92 lb 6.4 oz (41.9 kg)  03/15/17 93 lb 9.6 oz (42.5 kg)  02/28/17 95 lb (43.1 kg)   BP Readings from Last 3 Encounters:  03/19/17 (!) 130/46  03/15/17 110/70  02/20/17 (!) 163/79     Immunization History  Administered Date(s) Administered  . H1N1 11/02/2008  . Influenza Split 07/31/2011  . Influenza Whole 09/22/2007, 08/18/2009, 08/02/2010  . Influenza, High Dose Seasonal PF 06/26/2016  . Influenza,inj,Quad PF,36+ Mos 07/14/2013, 08/05/2014, 07/14/2015  .  Influenza-Unspecified 09/20/2012  . Pneumococcal Conjugate-13 08/10/2014  . Pneumococcal Polysaccharide-23 11/30/2003, 08/02/2010  . Td 12/14/2002  . Tdap 08/31/2013  . Zoster 01/26/2011    Health Maintenance  Topic Date Due  . INFLUENZA VACCINE  05/29/2017  . MAMMOGRAM  12/22/2017  . COLONOSCOPY  06/09/2023  . TETANUS/TDAP  09/01/2023  . DEXA SCAN  Completed  . PNA vac Low Risk Adult  Completed    Lab Results  Component Value Date   WBC 10.2 10/31/2016   HGB 12.9 10/31/2016   HCT 39.1 10/31/2016   PLT 363.0 10/31/2016   GLUCOSE 94 03/06/2017   CHOL 174 03/06/2017   TRIG 269.0 (H) 03/06/2017   HDL 78.30 03/06/2017   LDLDIRECT 58.0 03/06/2017   LDLCALC 76 01/12/2016   ALT 17 03/06/2017   AST 22 03/06/2017   NA 142 03/06/2017   K 3.7 03/06/2017   CL 95 (L) 03/06/2017   CREATININE 0.55 03/06/2017   BUN 18 03/06/2017   CO2 44 (H) 03/06/2017   TSH 2.72 03/06/2017   HGBA1C 6.3 03/06/2017    Lab Results  Component Value Date   TSH 2.72 03/06/2017   Lab Results  Component Value Date   WBC 10.2 10/31/2016   HGB 12.9 10/31/2016   HCT 39.1 10/31/2016   MCV 91.6 10/31/2016   PLT 363.0 10/31/2016   Lab Results  Component Value Date   NA 142 03/06/2017   K 3.7 03/06/2017   CO2 44 (H) 03/06/2017   GLUCOSE 94 03/06/2017   BUN 18 03/06/2017   CREATININE 0.55 03/06/2017   BILITOT 0.3 03/06/2017   ALKPHOS 40 03/06/2017   AST 22 03/06/2017   ALT 17 03/06/2017   PROT 6.4 03/06/2017   ALBUMIN 4.0 03/06/2017   CALCIUM 9.4 03/06/2017   ANIONGAP 5 11/14/2014   GFR 114.63  03/06/2017   Lab Results  Component Value Date   CHOL 174 03/06/2017   Lab Results  Component Value Date   HDL 78.30 03/06/2017   Lab Results  Component Value Date   LDLCALC 76 01/12/2016   Lab Results  Component Value Date   TRIG 269.0 (H) 03/06/2017   Lab Results  Component Value Date   CHOLHDL 2 03/06/2017   Lab Results  Component Value Date   HGBA1C 6.3 03/06/2017          Assessment & Plan:   Problem List Items Addressed This Visit    HYPERTENSION, BENIGN ESSENTIAL    Well controlled, no changes to meds. Encouraged heart healthy diet such as the DASH diet and exercise as tolerated.       Relevant Orders   CBC   Osteoporosis    Encouraged to get adequate exercise, calcium and vitamin d intake      COPD mixed type (HCC)    No recent exacerbation.       Hyperglycemia - Primary    hgba1c acceptable but did increase, minimize simple carbs. Increase exercise as tolerated. Continue current meds      Relevant Orders   Hemoglobin A1c   Comprehensive metabolic panel   TSH   Clostridium difficile diarrhea    Improved no concerns noted today      Hyperlipidemia    Encouraged heart healthy diet, increase exercise, avoid trans fats, consider a krill oil cap daily      Relevant Orders   Lipid panel      I am having Ms. Hardin Negus maintain her Calcium Carbonate-Vitamin D, Glucosamine-Chondroitin (GLUCOSAMINE CHONDR COMPLEX PO), albuterol, fluticasone, ondansetron, albuterol, Ferrous Fumarate-Folic Acid, arformoterol, budesonide, ipratropium, ALPRAZolam, predniSONE, ranitidine, mirtazapine, metoprolol succinate, citalopram, and citalopram.  No orders of the defined types were placed in this encounter.   CMA served as Education administrator during this visit. History, Physical and Plan performed by medical provider. Documentation and orders reviewed and attested to.  Penni Homans, MD

## 2017-03-19 NOTE — Assessment & Plan Note (Signed)
Follows closely with pulmonology and doing well at this time. No changes

## 2017-03-19 NOTE — Assessment & Plan Note (Signed)
Well controlled, no changes to meds. Encouraged heart healthy diet such as the DASH diet and exercise as tolerated.  °

## 2017-03-19 NOTE — Assessment & Plan Note (Signed)
Encouraged to get adequate exercise, calcium and vitamin d intake 

## 2017-03-20 DIAGNOSIS — J449 Chronic obstructive pulmonary disease, unspecified: Secondary | ICD-10-CM | POA: Diagnosis not present

## 2017-03-20 DIAGNOSIS — M6281 Muscle weakness (generalized): Secondary | ICD-10-CM | POA: Diagnosis not present

## 2017-03-20 DIAGNOSIS — R0602 Shortness of breath: Secondary | ICD-10-CM | POA: Diagnosis not present

## 2017-03-20 DIAGNOSIS — J9611 Chronic respiratory failure with hypoxia: Secondary | ICD-10-CM | POA: Diagnosis not present

## 2017-03-20 NOTE — Assessment & Plan Note (Signed)
No recent exacerbation 

## 2017-03-20 NOTE — Assessment & Plan Note (Signed)
Improved no concerns noted today

## 2017-03-27 ENCOUNTER — Other Ambulatory Visit: Payer: Self-pay | Admitting: Family Medicine

## 2017-04-04 ENCOUNTER — Telehealth: Payer: Self-pay | Admitting: Family Medicine

## 2017-04-04 DIAGNOSIS — J449 Chronic obstructive pulmonary disease, unspecified: Secondary | ICD-10-CM

## 2017-04-04 DIAGNOSIS — E876 Hypokalemia: Secondary | ICD-10-CM

## 2017-04-04 DIAGNOSIS — F329 Major depressive disorder, single episode, unspecified: Secondary | ICD-10-CM

## 2017-04-04 DIAGNOSIS — I10 Essential (primary) hypertension: Secondary | ICD-10-CM

## 2017-04-04 DIAGNOSIS — K219 Gastro-esophageal reflux disease without esophagitis: Secondary | ICD-10-CM

## 2017-04-04 DIAGNOSIS — R739 Hyperglycemia, unspecified: Secondary | ICD-10-CM

## 2017-04-04 DIAGNOSIS — F32A Depression, unspecified: Secondary | ICD-10-CM

## 2017-04-04 DIAGNOSIS — F419 Anxiety disorder, unspecified: Principal | ICD-10-CM

## 2017-04-04 NOTE — Telephone Encounter (Signed)
Relation to OF:BPZW Call back [place], [place] - [address] AT [place]  Reason for call:  Patient requesting a refill ALPRAZolam (XANAX) 0.5 MG tablet

## 2017-04-05 MED ORDER — ALPRAZOLAM 0.5 MG PO TABS: 0.5000 mg | ORAL_TABLET | Freq: Two times a day (BID) | ORAL | 0 refills | Status: DC | PRN

## 2017-04-05 NOTE — Telephone Encounter (Signed)
Faxed hardcopy for Alprazolam to [facility]

## 2017-04-05 NOTE — Telephone Encounter (Signed)
See rx. 

## 2017-04-05 NOTE — Telephone Encounter (Signed)
Requesting:   alprazolam Contract    08/12/2014 UDS   Low risk on 02/11/15 Last OV    03/24/2017---future apt is on 06/18/17 Last Refill    #180 with 1 refill on 10/08/2016  Please Advise

## 2017-04-09 DIAGNOSIS — R0602 Shortness of breath: Secondary | ICD-10-CM | POA: Diagnosis not present

## 2017-04-12 ENCOUNTER — Telehealth: Payer: Self-pay | Admitting: Adult Health

## 2017-04-12 MED ORDER — CEFDINIR 300 MG PO CAPS: 300.0000 mg | ORAL_CAPSULE | Freq: Two times a day (BID) | ORAL | 0 refills | Status: DC

## 2017-04-12 NOTE — Telephone Encounter (Signed)
Spoke with the pt and notified of recs and she she verbalized understanding  Rx was sent to pharm  She already has the pred so just sent abx

## 2017-04-12 NOTE — Telephone Encounter (Signed)
Spoke with pt, who reports of chest tightness, increased sob & non prod cough x2d Pt taken ventolin tid with no relief.  Denies fever, chills or sweats.  Pt is requesting Rx for abx.   RA please advise. Thanks.

## 2017-04-12 NOTE — Telephone Encounter (Signed)
Cefdinir 300 bid x 7ds Increase prednisone to 40 mg x 2 ds then back down to usual dose 10 mg

## 2017-04-15 ENCOUNTER — Other Ambulatory Visit: Payer: Self-pay | Admitting: Family Medicine

## 2017-04-15 ENCOUNTER — Encounter: Payer: Self-pay | Admitting: Family Medicine

## 2017-04-15 DIAGNOSIS — K219 Gastro-esophageal reflux disease without esophagitis: Secondary | ICD-10-CM

## 2017-04-15 DIAGNOSIS — I10 Essential (primary) hypertension: Secondary | ICD-10-CM

## 2017-04-15 DIAGNOSIS — F329 Major depressive disorder, single episode, unspecified: Secondary | ICD-10-CM

## 2017-04-15 DIAGNOSIS — E876 Hypokalemia: Secondary | ICD-10-CM

## 2017-04-15 DIAGNOSIS — R739 Hyperglycemia, unspecified: Secondary | ICD-10-CM

## 2017-04-15 DIAGNOSIS — J449 Chronic obstructive pulmonary disease, unspecified: Secondary | ICD-10-CM

## 2017-04-15 DIAGNOSIS — F419 Anxiety disorder, unspecified: Principal | ICD-10-CM

## 2017-04-15 NOTE — Telephone Encounter (Signed)
Requesting:   alprazolam Contract    08/12/14 UDS    Low risk on 02/11/15 Last OV    03/19/17----future appt Is on 06/18/2017 Last Refill   #60 on 04/05/17  Please Advise

## 2017-04-15 NOTE — Telephone Encounter (Signed)
I have approved but she needs UDS and contract

## 2017-04-15 NOTE — Telephone Encounter (Signed)
Contract signed on 10/2016 Patient will pickup hardcopy/complete UDS .

## 2017-04-20 DIAGNOSIS — J9611 Chronic respiratory failure with hypoxia: Secondary | ICD-10-CM | POA: Diagnosis not present

## 2017-04-20 DIAGNOSIS — M6281 Muscle weakness (generalized): Secondary | ICD-10-CM | POA: Diagnosis not present

## 2017-04-20 DIAGNOSIS — R0602 Shortness of breath: Secondary | ICD-10-CM | POA: Diagnosis not present

## 2017-04-20 DIAGNOSIS — J449 Chronic obstructive pulmonary disease, unspecified: Secondary | ICD-10-CM | POA: Diagnosis not present

## 2017-05-01 ENCOUNTER — Other Ambulatory Visit: Payer: Self-pay | Admitting: Family Medicine

## 2017-05-09 DIAGNOSIS — R0602 Shortness of breath: Secondary | ICD-10-CM | POA: Diagnosis not present

## 2017-05-16 ENCOUNTER — Ambulatory Visit: Payer: Medicare PPO | Admitting: *Deleted

## 2017-05-20 ENCOUNTER — Encounter: Payer: Self-pay | Admitting: Pulmonary Disease

## 2017-05-20 ENCOUNTER — Ambulatory Visit (INDEPENDENT_AMBULATORY_CARE_PROVIDER_SITE_OTHER): Payer: Medicare PPO | Admitting: Pulmonary Disease

## 2017-05-20 DIAGNOSIS — K2100 Gastro-esophageal reflux disease with esophagitis, without bleeding: Secondary | ICD-10-CM

## 2017-05-20 DIAGNOSIS — R0602 Shortness of breath: Secondary | ICD-10-CM | POA: Diagnosis not present

## 2017-05-20 DIAGNOSIS — J449 Chronic obstructive pulmonary disease, unspecified: Secondary | ICD-10-CM

## 2017-05-20 DIAGNOSIS — J9611 Chronic respiratory failure with hypoxia: Secondary | ICD-10-CM | POA: Diagnosis not present

## 2017-05-20 DIAGNOSIS — M6281 Muscle weakness (generalized): Secondary | ICD-10-CM | POA: Diagnosis not present

## 2017-05-20 MED ORDER — PANTOPRAZOLE SODIUM 40 MG PO TBEC: 40.0000 mg | DELAYED_RELEASE_TABLET | Freq: Every day | ORAL | 0 refills | Status: DC

## 2017-05-20 NOTE — Progress Notes (Signed)
   Subjective:    Patient ID: [name], female    DOB: [birth year], 76 y.o.   MRN: [id number]  HPI  63 yowf , heavy ex- smoker, quit 2002 with GOLD D COPD on 24 h O2 since 2009.        FEV1 26% 03/2009  Recurrent flares 2-3/yr requiring steroid bursts, steroid dependent 5mg since 2014 She uses 3 liters 24/7. On a regimen of budesonide/brovana &alb/atrovent nebs   She complains of epigastric burning and this is making her breathing worse. She also complains of nasal congestion and breathing improves after blowing her nose. She does continue to the BR a lot but denies dark stools.  She is otherwise compliant with her regimen of Pulmicort and Brovana. She is compliant with oxygen. She is maintained on 10 mg of prednisone daily. She never has breakfast and often takes this on an empty stomach. She is also maintained on 300 mg of ranitidine at bedtime for GERD/hiatal hernia   Significant tests/ events  Admitted 12/2012 for COPD exacerbation .ABG 7.39/55/75 on 3L [place]   08/2013 BL pneumonia  -completed pulm rehab 2011    CT chest 10/2014 neg PE  12/06/2014 >>2.5 L at rest , 3L on exertion - ONO ok in the past  2016 c diff 2017 Underwent 2 infusions of stem cell in 2017 but did not get much benefit   Review of Systems  neg for any significant sore throat, dysphagia, itching, sneezing, nasal congestion or excess/ purulent secretions, fever, chills, sweats, unintended wt loss, pleuritic or exertional cp, hempoptysis, orthopnea pnd or change in chronic leg swelling.   Also denies presyncope, palpitations, heartburn, abdominal pain, nausea, vomiting, diarrhea or change in bowel or urinary habits, dysuria,hematuria, rash, arthralgias, visual complaints, headache, numbness weakness or ataxia.     Objective:   Physical Exam  Gen. Pleasant, htin, frail in no distress ENT - no thrush, no post nasal drip Neck: No JVD, no thyromegaly, no carotid bruits Lungs: no use of  accessory muscles, no dullness to percussion, clear without rales or rhonchi  Cardiovascular: Rhythm regular, heart sounds  normal, no murmurs or gallops, no peripheral edema Abd - soft, non tender Musculoskeletal: No deformities, no cyanosis or clubbing        Assessment & Plan:

## 2017-05-20 NOTE — Assessment & Plan Note (Signed)
Continue 3 L of oxygen

## 2017-05-20 NOTE — Patient Instructions (Signed)
Trial of Protonix 40 mg daily for 4 weeks, then can resume ranitidine  Take prednisone only with meals or at least with a glass of milk, never on an empty stomach  Get your blood work done early within the next week  Call us if no better

## 2017-05-20 NOTE — Assessment & Plan Note (Signed)
Does not appear to be having a COPD flare Breathing is worse but that may simply be related to abdominal issues Continue Pulmicort and Brovana

## 2017-05-20 NOTE — Assessment & Plan Note (Signed)
Trial of Protonix 40 mg daily for 4 weeks, then can resume ranitidine  Take prednisone only with meals or at least with a glass of milk, never on an empty stomach  Get your blood work done early within the next week

## 2017-05-23 ENCOUNTER — Ambulatory Visit: Payer: Medicare PPO | Admitting: Adult Health

## 2017-06-06 NOTE — Progress Notes (Signed)
Subjective:   [name] is a 76 y.o. female who presents for Medicare Annual (Subsequent) preventive examination. Pt very pleasant. Presents on 3l O2 via [place]. Pt reports she no longer has any good days since she is in End Stage COPD. Review of Systems:  No ROS.  Medicare Wellness Visit. Additional risk factors are reflected in the social history.  Cardiac Risk Factors include: advanced age (>41men, >48 women);dyslipidemia;hypertension Home Safety/Smoke Alarms: Feels safe in home. Smoke alarms in place.  Seat Belt Safety/Bike Helmet: Wears seat belt.   Counseling:   Eye Exam- Hx of cataract sx.  Wears reading glasses.   Female:   Pap- Hysterectomy      Mammo- last 12/23/15: BI-RADS CATEGORY  1: Negative       Dexa scan- last 01/17/17: osteoporosis         CCS- last 06/08/13: pre-cancerous polyp removed. Recall 5 yrs.   Objective:     Vitals: BP 110/64 (BP Location: Right Arm, Patient Position: Sitting, Cuff Size: Normal)   Pulse 92   Ht 5\' 1"  (1.549 m)   Wt 95 lb 9.6 oz (43.4 kg)   SpO2 98% Comment: 3L via [place]  BMI 18.06 kg/m   Body mass index is 18.06 kg/m.   Tobacco History  Smoking Status  . Former Smoker  . Packs/day: 2.00  . Years: 40.00  . Types: Cigarettes  . Quit date: 10/29/2001  Smokeless Tobacco  . Never Used     Counseling given: Not Answered   Past Medical History:  Diagnosis Date  . Anxiety   . Arthralgia   . Atrophic vaginitis 12/07/2012  . Chest pain   . Clostridium difficile diarrhea 12/27/2014  . COPD (chronic obstructive pulmonary disease) ([place])   . Depression   . GERD (gastroesophageal reflux disease)   . Glucose intolerance (impaired glucose tolerance)   . History of small bowel obstruction   . Hx of adenomatous colonic polyps   . Hyperglycemia 12/01/2013  . Hyperlipidemia 03/19/2017  . Hypertension   . Ischemic colitis ([place])   . Medicare annual wellness visit, subsequent 01/12/2016  . Migraine   . Nocturia 09/02/2013  .  Osteoporosis   . Thyroid disease   . Weight loss    Past Surgical History:  Procedure Laterality Date  . ABDOMINAL HYSTERECTOMY  1993  . BLADDER REPAIR    . CHOLECYSTECTOMY    . COLONOSCOPY WITH PROPOFOL N/A 06/08/2013   Procedure: COLONOSCOPY WITH PROPOFOL;  Surgeon: [place] Dragon, MD;  Location: [facility];  Service: Endoscopy;  Laterality: N/A;  . DILATION AND CURETTAGE OF UTERUS     40 yrs ago  . EYE SURGERY  04/2011   bil. eyes  . TUBAL LIGATION     Family History  Problem Relation Age of Onset  . Alcohol abuse Mother   . Heart disease Mother   . Stroke Mother   . Hypertension Mother   . Kidney disease Mother   . COPD Mother   . Osteoporosis Mother        hip fracture  . COPD Brother   . Alcohol abuse Brother        drug abuse  . Hearing loss Brother        mvp  . Alcohol abuse Father        MVA  . Thyroid disease Daughter   . COPD Paternal Grandfather   . COPD Sister   . Hypertension Sister   . Anxiety disorder Sister   .  COPD Sister   . COPD Brother        current smoker  . Hearing loss Brother        cad  . Prostate cancer Brother   . COPD Brother   . Colon cancer Paternal Aunt   . Prostate cancer Unknown    History  Sexual Activity  . Sexual activity: No    Outpatient Encounter Prescriptions as of 06/20/2017  Medication Sig  . albuterol (PROVENTIL HFA;VENTOLIN HFA) 108 (90 BASE) MCG/ACT inhaler Inhale 2 puffs into the lungs every 6 (six) hours as needed for wheezing or shortness of breath.  Marland Kitchen albuterol (PROVENTIL) (2.5 MG/3ML) 0.083% nebulizer solution Take 3 mLs (2.5 mg total) by nebulization every 4 (four) hours as needed for wheezing or shortness of breath. Dx 496  . ALPRAZolam (XANAX) 0.5 MG tablet TAKE 1 TABLET BY MOUTH TWICE DAILY AS NEEDED FOR ANXIETY  . arformoterol (BROVANA) 15 MCG/2ML NEBU Take 2 mLs (15 mcg total) by nebulization 2 (two) times daily.  . budesonide (PULMICORT) 0.25 MG/2ML nebulizer solution Take 2 mLs (0.25 mg total) by  nebulization 2 (two) times daily. Dx: J44.9  . Calcium Carbonate-Vitamin D 600-400 MG-UNIT per tablet Take 1 tablet by mouth 2 (two) times daily.    . citalopram (CELEXA) 20 MG tablet TAKE 1 TABLET(20 MG) BY MOUTH DAILY  . Ferrous Fumarate-Folic Acid (HEMOCYTE-F) 324-1 MG TABS Take 1 tablet by mouth daily.  . fluticasone (FLONASE) 50 MCG/ACT nasal spray Place 2 sprays into both nostrils 2 (two) times daily as needed.   . Glucosamine-Chondroitin (GLUCOSAMINE CHONDR COMPLEX PO) Take 1 tablet by mouth 2 (two) times daily.   Marland Kitchen ipratropium (ATROVENT) 0.02 % nebulizer solution Take 2.5 mLs (0.5 mg total) by nebulization 4 (four) times daily.  . metoprolol succinate (TOPROL-XL) 25 MG 24 hr tablet Take 1 tablet (25 mg total) by mouth daily.  . mirtazapine (REMERON) 15 MG tablet TAKE 1 TABLET(15 MG) BY MOUTH AT BEDTIME  . pantoprazole (PROTONIX) 40 MG tablet TAKE 1 TABLET(40 MG) BY MOUTH DAILY  . predniSONE (DELTASONE) 10 MG tablet Take 1 tablet (10 mg total) by mouth daily.  . [DISCONTINUED] mirtazapine (REMERON) 15 MG tablet Take 1 tablet (15 mg total) by mouth at bedtime.  . ondansetron (ZOFRAN) 4 MG tablet Take 1 tablet (4 mg total) by mouth every 8 (eight) hours as needed for nausea or vomiting. (Patient not taking: Reported on 06/20/2017)  . ranitidine (ZANTAC) 300 MG capsule Take 1 capsule (300 mg total) by mouth every evening. (Patient not taking: Reported on 06/20/2017)  . [DISCONTINUED] cefdinir (OMNICEF) 300 MG capsule Take 1 capsule (300 mg total) by mouth 2 (two) times daily.  . [DISCONTINUED] pantoprazole (PROTONIX) 40 MG tablet Take 1 tablet (40 mg total) by mouth daily.   No facility-administered encounter medications on file as of 06/20/2017.     Activities of Daily Living In your present state of health, do you have any difficulty performing the following activities: 06/20/2017 03/19/2017  Hearing? N N  Vision? N N  Difficulty concentrating or making decisions? Y N  Comment small things.  Will do MMSE -  Walking or climbing stairs? Y N  Comment SOB at all times. -  Dressing or bathing? Y N  Comment SOB at all times. -  Doing errands, shopping? N N  Preparing Food and eating ? Y -  Comment SOB at all times. -  Using the Toilet? N -  In the past six months, have you accidently  leaked urine? Y -  Do you have problems with loss of bowel control? N -  Managing your Medications? N -  Managing your Finances? N -  Housekeeping or managing your Housekeeping? Y -  Comment has housekeeper -  Some recent data might be hidden    Patient Care Team: Mosie Lukes, MD as PCP - General (Family Medicine)    Assessment:    Physical assessment deferred to PCP.  Exercise Activities and Dietary recommendations Current Exercise Habits: The patient does not participate in regular exercise at present, Exercise limited by: respiratory conditions(s) Diet (meal preparation, eat out, water intake, caffeinated beverages, dairy products, fruits and vegetables): Meat and vegetables every night. Pt states she is trying to gain weight.    Goals    . Pt states she will just be happy to alive for another year.      Fall Risk Fall Risk  06/20/2017 03/19/2017 02/24/2016 11/12/2014  Falls in the past year? No No No No   Depression Screen PHQ 2/9 Scores 06/20/2017 03/19/2017 02/24/2016 11/12/2014  PHQ - 2 Score 2 0 0 0  PHQ- 9 Score 7 - - -     Cognitive Function MMSE - Mini Mental State Exam 06/20/2017  Orientation to time 5  Orientation to Place 5  Registration 3  Attention/ Calculation 5  Recall 3  Language- name 2 objects 2  Language- repeat 1  Language- follow 3 step command 3  Language- read & follow direction 1  Write a sentence 1  Copy design 1  Total score 30        Immunization History  Administered Date(s) Administered  . H1N1 11/02/2008  . Influenza Split 07/31/2011  . Influenza Whole 09/22/2007, 08/18/2009, 08/02/2010  . Influenza, High Dose Seasonal PF 06/26/2016  .  Influenza,inj,Quad PF,6+ Mos 07/14/2013, 08/05/2014, 07/14/2015  . Influenza-Unspecified 09/20/2012  . Pneumococcal Conjugate-13 08/10/2014  . Pneumococcal Polysaccharide-23 11/30/2003, 08/02/2010  . Td 12/14/2002  . Tdap 08/31/2013  . Zoster 01/26/2011   Screening Tests Health Maintenance  Topic Date Due  . INFLUENZA VACCINE  05/29/2017  . MAMMOGRAM  12/22/2017  . COLONOSCOPY  06/09/2023  . TETANUS/TDAP  09/01/2023  . DEXA SCAN  Completed  . PNA vac Low Risk Adult  Completed      Plan:   Follow up with PCP today as scheduled.   Continue to eat heart healthy diet (full of fruits, vegetables, whole grains, lean protein, water--limit salt, fat, and sugar intake) and increase physical activity as tolerated.  Continue doing brain stimulating activities (puzzles, reading, adult coloring books, staying active) to keep memory sharp.   Bring a copy of your living will and/or healthcare power of attorney to your next office visit.  I have ordered your mammogram as we discussed. They will call you to schedule appointment.  I have personally reviewed and noted the following in the patient's chart:   . Medical and social history . Use of alcohol, tobacco or illicit drugs  . Current medications and supplements . Functional ability and status . Nutritional status . Physical activity . Advanced directives . List of other physicians . Hospitalizations, surgeries, and ER visits in previous 12 months . Vitals . Screenings to include cognitive, depression, and falls . Referrals and appointments  In addition, I have reviewed and discussed with patient certain preventive protocols, quality metrics, and best practice recommendations. A written personalized care plan for preventive services as well as general preventive health recommendations were provided to patient.  Naaman Plummer Dwight, [place]  06/20/2017

## 2017-06-07 ENCOUNTER — Other Ambulatory Visit: Payer: Medicare PPO

## 2017-06-07 DIAGNOSIS — R739 Hyperglycemia, unspecified: Secondary | ICD-10-CM | POA: Diagnosis not present

## 2017-06-07 DIAGNOSIS — I10 Essential (primary) hypertension: Secondary | ICD-10-CM | POA: Diagnosis not present

## 2017-06-07 LAB — COMPREHENSIVE METABOLIC PANEL
ALBUMIN: 4.1 g/dL (ref 3.6–5.1)
ALT: 21 U/L (ref 6–29)
AST: 26 U/L (ref 10–35)
Alkaline Phosphatase: 51 U/L (ref 33–130)
BILIRUBIN TOTAL: 0.3 mg/dL (ref 0.2–1.2)
BUN: 23 mg/dL (ref 7–25)
CHLORIDE: 93 mmol/L — AB (ref 98–110)
CO2: 38 mmol/L — AB (ref 20–32)
Calcium: 9.1 mg/dL (ref 8.6–10.4)
Creat: 0.5 mg/dL — ABNORMAL LOW (ref 0.60–0.93)
Glucose, Bld: 101 mg/dL — ABNORMAL HIGH (ref 65–99)
Potassium: 3.7 mmol/L (ref 3.5–5.3)
Sodium: 142 mmol/L (ref 135–146)
Total Protein: 6.3 g/dL (ref 6.1–8.1)

## 2017-06-07 LAB — LIPID PANEL
Cholesterol: 200 mg/dL — ABNORMAL HIGH (ref <200)
HDL: 99 mg/dL (ref >50)
LDL CALC: 61 mg/dL (ref <100)
TRIGLYCERIDES: 200 mg/dL — AB (ref <150)
Total CHOL/HDL Ratio: 2 Ratio (ref <5.0)
VLDL: 40 mg/dL — AB (ref <30)

## 2017-06-07 LAB — CBC WITH DIFFERENTIAL/PLATELET
BASOS ABS: 117 {cells}/uL (ref 0–200)
Basophils Relative: 1 %
EOS PCT: 2 %
Eosinophils Absolute: 234 cells/uL (ref 15–500)
HCT: 41.4 % (ref 35.0–45.0)
Hemoglobin: 12.9 g/dL (ref 11.7–15.5)
Lymphocytes Relative: 26 %
Lymphs Abs: 3042 cells/uL (ref 850–3900)
MCH: 29.9 pg (ref 27.0–33.0)
MCHC: 31.2 g/dL — AB (ref 32.0–36.0)
MCV: 95.8 fL (ref 80.0–100.0)
MONOS PCT: 8 %
MPV: 10.1 fL (ref 7.5–12.5)
Monocytes Absolute: 936 cells/uL (ref 200–950)
NEUTROS ABS: 7371 {cells}/uL (ref 1500–7800)
NEUTROS PCT: 63 %
Platelets: 391 10*3/uL (ref 140–400)
RBC: 4.32 MIL/uL (ref 3.80–5.10)
RDW: 14.1 % (ref 11.0–15.0)
WBC: 11.7 10*3/uL — AB (ref 3.8–10.8)

## 2017-06-07 LAB — TSH: TSH: 3.42 mIU/L

## 2017-06-08 LAB — HEMOGLOBIN A1C
HEMOGLOBIN A1C: 5.6 % (ref <5.7)
MEAN PLASMA GLUCOSE: 114 mg/dL

## 2017-06-09 DIAGNOSIS — R0602 Shortness of breath: Secondary | ICD-10-CM | POA: Diagnosis not present

## 2017-06-11 ENCOUNTER — Other Ambulatory Visit: Payer: Medicare PPO

## 2017-06-13 ENCOUNTER — Encounter: Payer: Self-pay | Admitting: Adult Health

## 2017-06-13 ENCOUNTER — Ambulatory Visit (INDEPENDENT_AMBULATORY_CARE_PROVIDER_SITE_OTHER): Payer: Medicare PPO | Admitting: Adult Health

## 2017-06-13 DIAGNOSIS — J9611 Chronic respiratory failure with hypoxia: Secondary | ICD-10-CM

## 2017-06-13 DIAGNOSIS — J449 Chronic obstructive pulmonary disease, unspecified: Secondary | ICD-10-CM

## 2017-06-13 NOTE — Patient Instructions (Signed)
Continue on Ipratropium Neb Four times a day   Continue on Prednisone 10mg  daily  Continue on Brovana and Budesonide Neb Twice daily  .  Continue on 2 L rest and 3l/m activity .  Follow up in 2  month in [facility] med center with Dr. Elsworth Soho  And As needed   Please contact office for sooner follow up if symptoms do not improve or worsen or seek emergency care

## 2017-06-13 NOTE — Assessment & Plan Note (Signed)
Cont on o2 .  

## 2017-06-13 NOTE — Assessment & Plan Note (Signed)
Severe COPD - stable on regimen  Discuss pulmonary rehab on return again   Plan  Patient Instructions  Continue on Ipratropium Neb Four times a day   Continue on Prednisone 10mg  daily  Continue on Brovana and Budesonide Neb Twice daily  .  Continue on 2 L rest and 3l/m activity .  Follow up in 2  month in [facility] med center with Dr. Elsworth Soho  And As needed   Please contact office for sooner follow up if symptoms do not improve or worsen or seek emergency care

## 2017-06-13 NOTE — Progress Notes (Signed)
@Patient  ID: [name], female    DOB: July 13, 1941, 76 y.o.   MRN: [id number]  No chief complaint on file.   Referring provider: Mosie Lukes, MD  HPI: 76 yowf , heavy ex- smoker, quit 2002 with GOLD D COPD FEV1 26% 6/10 on 24 h O2 since 2009.  -completed pulm rehab oct '11  Recurrent flares 2-3/yr requiring steroid bursts  She uses 3 liters 24/7. On a regimen of budesonide/brovana &alb/atrovent nebs   Significant tests/ events Admitted 12/2012 for COPD exacerbation .ABG 7.39/55/75 on 3L [place]  She required low dose prednisone since her admit in 02/2013   Had colonoscopy with polyp removal 06/2013  08/2013 BL pneumonia  03/2014 ONO on 2L >>no sig desatn  CT chest 10/2014 neg PE  12/06/2014 >>2.5 L at rest , 3L on exertion - ONO ok in the past [facility] 12/2015    06/13/2017  Follow up : COPD , O2 RF  Pt returns for 2 month follow up . Says breathing is doing about the same. She gets winded with minimal activity . Is very sedentary . We discussed pulmonary rehab , she is willing to do this but afraid it will stress her husband.  She denies cough or wheezing  Remains on ipratropium nebulizer 4 times daily, Brovana and  budesonide nebulizer twice daily. She is on prednisone 10 mg daily. She uses oxygen 2 L. She denies any fever, chest pain, orthopnea, PND, or increased leg swelling at rest and 3 L with activity.   Allergies  Allergen Reactions  . Morphine Other (See Comments)    REACTION: nightmares  . Ambien [Zolpidem] Anxiety  . Cymbalta [Duloxetine Hcl] Anxiety  . Nitrofurantoin Rash    Immunization History  Administered Date(s) Administered  . H1N1 11/02/2008  . Influenza Split 07/31/2011  . Influenza Whole 09/22/2007, 08/18/2009, 08/02/2010  . Influenza, High Dose Seasonal PF 06/26/2016  . Influenza,inj,Quad PF,36+ Mos 07/14/2013, 08/05/2014, 07/14/2015  . Influenza-Unspecified 09/20/2012  . Pneumococcal Conjugate-13 08/10/2014   . Pneumococcal Polysaccharide-23 11/30/2003, 08/02/2010  . Td 12/14/2002  . Tdap 08/31/2013  . Zoster 01/26/2011    Past Medical History:  Diagnosis Date  . Anxiety   . Arthralgia   . Atrophic vaginitis 12/07/2012  . Chest pain   . Clostridium difficile diarrhea 12/27/2014  . COPD (chronic obstructive pulmonary disease) ([place])   . Depression   . GERD (gastroesophageal reflux disease)   . Glucose intolerance (impaired glucose tolerance)   . History of small bowel obstruction   . Hx of adenomatous colonic polyps   . Hyperglycemia 12/01/2013  . Hyperlipidemia 03/19/2017  . Hypertension   . Ischemic colitis ([place])   . Medicare annual wellness visit, subsequent 01/12/2016  . Migraine   . Nocturia 09/02/2013  . Osteoporosis   . Thyroid disease   . Weight loss     Tobacco History: History  Smoking Status  . Former Smoker  . Packs/day: 2.00  . Years: 40.00  . Types: Cigarettes  . Quit date: 10/29/2001  Smokeless Tobacco  . Never Used   Counseling given: Not Answered   Outpatient Encounter Prescriptions as of 06/13/2017  Medication Sig  . albuterol (PROVENTIL HFA;VENTOLIN HFA) 108 (90 BASE) MCG/ACT inhaler Inhale 2 puffs into the lungs every 6 (six) hours as needed for wheezing or shortness of breath.  Marland Kitchen albuterol (PROVENTIL) (2.5 MG/3ML) 0.083% nebulizer solution Take 3 mLs (2.5 mg total) by nebulization every 4 (four) hours as needed for wheezing or shortness  of breath. Dx 496  . ALPRAZolam (XANAX) 0.5 MG tablet TAKE 1 TABLET BY MOUTH TWICE DAILY AS NEEDED FOR ANXIETY  . arformoterol (BROVANA) 15 MCG/2ML NEBU Take 2 mLs (15 mcg total) by nebulization 2 (two) times daily.  . budesonide (PULMICORT) 0.25 MG/2ML nebulizer solution Take 2 mLs (0.25 mg total) by nebulization 2 (two) times daily. Dx: J44.9  . Calcium Carbonate-Vitamin D 600-400 MG-UNIT per tablet Take 1 tablet by mouth 2 (two) times daily.    . citalopram (CELEXA) 20 MG tablet TAKE 1 TABLET(20 MG) BY MOUTH DAILY  .  Ferrous Fumarate-Folic Acid (HEMOCYTE-F) 324-1 MG TABS Take 1 tablet by mouth daily.  . fluticasone (FLONASE) 50 MCG/ACT nasal spray Place 2 sprays into both nostrils 2 (two) times daily as needed.   . Glucosamine-Chondroitin (GLUCOSAMINE CHONDR COMPLEX PO) Take 1 tablet by mouth 2 (two) times daily.   Marland Kitchen ipratropium (ATROVENT) 0.02 % nebulizer solution Take 2.5 mLs (0.5 mg total) by nebulization 4 (four) times daily.  . metoprolol succinate (TOPROL-XL) 25 MG 24 hr tablet Take 1 tablet (25 mg total) by mouth daily.  . mirtazapine (REMERON) 15 MG tablet Take 1 tablet (15 mg total) by mouth at bedtime.  . mirtazapine (REMERON) 15 MG tablet TAKE 1 TABLET(15 MG) BY MOUTH AT BEDTIME  . ondansetron (ZOFRAN) 4 MG tablet Take 1 tablet (4 mg total) by mouth every 8 (eight) hours as needed for nausea or vomiting.  . pantoprazole (PROTONIX) 40 MG tablet Take 1 tablet (40 mg total) by mouth daily.  . predniSONE (DELTASONE) 10 MG tablet Take 1 tablet (10 mg total) by mouth daily.  . ranitidine (ZANTAC) 300 MG capsule Take 1 capsule (300 mg total) by mouth every evening.  . [DISCONTINUED] cefdinir (OMNICEF) 300 MG capsule Take 1 capsule (300 mg total) by mouth 2 (two) times daily.   No facility-administered encounter medications on file as of 06/13/2017.      Review of Systems  Constitutional:   No  weight loss, night sweats,  Fevers, chills,  +fatigue, or  lassitude.  HEENT:   No headaches,  Difficulty swallowing,  Tooth/dental problems, or  Sore throat,                No sneezing, itching, ear ache, nasal congestion, post nasal drip,   CV:  No chest pain,  Orthopnea, PND, swelling in lower extremities, anasarca, dizziness, palpitations, syncope.   GI  No heartburn, indigestion, abdominal pain, nausea, vomiting, diarrhea, change in bowel habits, loss of appetite, bloody stools.   Resp:   No non-productive cough,  No coughing up of blood.  No change in color of mucus.  No wheezing.  No chest wall  deformity  Skin: no rash or lesions.  GU: no dysuria, change in color of urine, no urgency or frequency.  No flank pain, no hematuria   MS:  No joint pain or swelling.  No decreased range of motion.  No back pain.    Physical Exam  BP 110/80 (BP Location: Left Arm, Patient Position: Sitting, Cuff Size: Normal)   Pulse 74   Ht 5\' 1"  (1.549 m)   Wt 93 lb (42.2 kg)   SpO2 99%   BMI 17.57 kg/m   GEN: A/Ox3; pleasant , NAD, thin and frail , on O2    HEENT:  [place]/AT,  EACs-clear, TMs-wnl, NOSE-clear, THROAT-clear, no lesions, no postnasal drip or exudate noted.   NECK:  Supple w/ fair ROM; no JVD; normal carotid impulses w/o bruits; no  thyromegaly or nodules palpated; no lymphadenopathy.    RESP  Decreased BS in bases ,  no accessory muscle use, no dullness to percussion  CARD:  RRR, no m/r/g, no peripheral edema, pulses intact, no cyanosis or clubbing.  GI:   Soft & nt; nml bowel sounds; no organomegaly or masses detected.   Musco: Warm bil, no deformities or joint swelling noted.   Neuro: alert, no focal deficits noted.    Skin: Warm, no lesions or rashes    Lab Results:   BMET   Imaging: No results found.   Assessment & Plan:   COPD mixed type ([place]) Severe COPD - stable on regimen  Discuss pulmonary rehab on return again   Plan  Patient Instructions  Continue on Ipratropium Neb Four times a day   Continue on Prednisone 10mg  daily  Continue on Brovana and Budesonide Neb Twice daily  .  Continue on 2 L rest and 3l/m activity .  Follow up in 2  month in [facility] med center with Dr. Elsworth Soho  And As needed   Please contact office for sooner follow up if symptoms do not improve or worsen or seek emergency care       Chronic respiratory failure Cont on o2      Tammy Parrett, NP 06/13/2017

## 2017-06-14 ENCOUNTER — Other Ambulatory Visit: Payer: Self-pay | Admitting: Pulmonary Disease

## 2017-06-18 ENCOUNTER — Ambulatory Visit: Payer: Medicare PPO | Admitting: Family Medicine

## 2017-06-20 ENCOUNTER — Ambulatory Visit (INDEPENDENT_AMBULATORY_CARE_PROVIDER_SITE_OTHER): Payer: Medicare PPO | Admitting: Family Medicine

## 2017-06-20 ENCOUNTER — Encounter: Payer: Self-pay | Admitting: Family Medicine

## 2017-06-20 VITALS — BP 110/64 | HR 92 | Ht 61.0 in | Wt 95.6 lb

## 2017-06-20 DIAGNOSIS — J9611 Chronic respiratory failure with hypoxia: Secondary | ICD-10-CM | POA: Diagnosis not present

## 2017-06-20 DIAGNOSIS — R739 Hyperglycemia, unspecified: Secondary | ICD-10-CM

## 2017-06-20 DIAGNOSIS — Z0000 Encounter for general adult medical examination without abnormal findings: Secondary | ICD-10-CM | POA: Diagnosis not present

## 2017-06-20 DIAGNOSIS — I10 Essential (primary) hypertension: Secondary | ICD-10-CM | POA: Diagnosis not present

## 2017-06-20 DIAGNOSIS — J449 Chronic obstructive pulmonary disease, unspecified: Secondary | ICD-10-CM | POA: Diagnosis not present

## 2017-06-20 DIAGNOSIS — Z1239 Encounter for other screening for malignant neoplasm of breast: Secondary | ICD-10-CM

## 2017-06-20 DIAGNOSIS — R0602 Shortness of breath: Secondary | ICD-10-CM | POA: Diagnosis not present

## 2017-06-20 DIAGNOSIS — M6281 Muscle weakness (generalized): Secondary | ICD-10-CM | POA: Diagnosis not present

## 2017-06-20 DIAGNOSIS — Z1231 Encounter for screening mammogram for malignant neoplasm of breast: Secondary | ICD-10-CM | POA: Diagnosis not present

## 2017-06-20 DIAGNOSIS — F418 Other specified anxiety disorders: Secondary | ICD-10-CM

## 2017-06-20 MED ORDER — CITALOPRAM HYDROBROMIDE 40 MG PO TABS: 40.0000 mg | ORAL_TABLET | Freq: Every day | ORAL | 1 refills | Status: DC

## 2017-06-20 NOTE — Assessment & Plan Note (Signed)
Well controlled, no changes to meds. Encouraged heart healthy diet such as the DASH diet and exercise as tolerated.  °

## 2017-06-20 NOTE — Patient Instructions (Signed)
[name] , Thank you for taking time to come for your Medicare Wellness Visit. I appreciate your ongoing commitment to your health goals. Please review the following plan we discussed and let me know if I can assist you in the future.   These are the goals we discussed: Goals    . Pt states she will just be happy to alive for another year.       This is a list of the screening recommended for you and due dates:  Health Maintenance  Topic Date Due  . Flu Shot  05/29/2017  . Mammogram  12/22/2017  . Colon Cancer Screening  06/09/2023  . Tetanus Vaccine  09/01/2023  . DEXA scan (bone density measurement)  Completed  . Pneumonia vaccines  Completed   Continue to eat heart healthy diet (full of fruits, vegetables, whole grains, lean protein, water--limit salt, fat, and sugar intake) and increase physical activity as tolerated.  Continue doing brain stimulating activities (puzzles, reading, adult coloring books, staying active) to keep memory sharp.   Bring a copy of your living will and/or healthcare power of attorney to your next office visit.  I have ordered your mammogram as we discussed. They will call you to schedule appointment.   Health Maintenance for Postmenopausal Women Menopause is a normal process in which your reproductive ability comes to an end. This process happens gradually over a span of months to years, usually between the ages of 2 and 68. Menopause is complete when you have missed 12 consecutive menstrual periods. It is important to talk with your health care provider about some of the most common conditions that affect postmenopausal women, such as heart disease, cancer, and bone loss (osteoporosis). Adopting a healthy lifestyle and getting preventive care can help to promote your health and wellness. Those actions can also lower your chances of developing some of these common conditions. What should I know about menopause? During menopause, you may experience a  number of symptoms, such as:  Moderate-to-severe hot flashes.  Night sweats.  Decrease in sex drive.  Mood swings.  Headaches.  Tiredness.  Irritability.  Memory problems.  Insomnia.  Choosing to treat or not to treat menopausal changes is an individual decision that you make with your health care provider. What should I know about hormone replacement therapy and supplements? Hormone therapy products are effective for treating symptoms that are associated with menopause, such as hot flashes and night sweats. Hormone replacement carries certain risks, especially as you become older. If you are thinking about using estrogen or estrogen with progestin treatments, discuss the benefits and risks with your health care provider. What should I know about heart disease and stroke? Heart disease, heart attack, and stroke become more likely as you age. This may be due, in part, to the hormonal changes that your body experiences during menopause. These can affect how your body processes dietary fats, triglycerides, and cholesterol. Heart attack and stroke are both medical emergencies. There are many things that you can do to help prevent heart disease and stroke:  Have your blood pressure checked at least every 1-2 years. High blood pressure causes heart disease and increases the risk of stroke.  If you are 19-43 years old, ask your health care provider if you should take aspirin to prevent a heart attack or a stroke.  Do not use any tobacco products, including cigarettes, chewing tobacco, or electronic cigarettes. If you need help quitting, ask your health care provider.  It is important  to eat a healthy diet and maintain a healthy weight. ? Be sure to include plenty of vegetables, fruits, low-fat dairy products, and lean protein. ? Avoid eating foods that are high in solid fats, added sugars, or salt (sodium).  Get regular exercise. This is one of the most important things that you can do  for your health. ? Try to exercise for at least 150 minutes each week. The type of exercise that you do should increase your heart rate and make you sweat. This is known as moderate-intensity exercise. ? Try to do strengthening exercises at least twice each week. Do these in addition to the moderate-intensity exercise.  Know your numbers.Ask your health care provider to check your cholesterol and your blood glucose. Continue to have your blood tested as directed by your health care provider.  What should I know about cancer screening? There are several types of cancer. Take the following steps to reduce your risk and to catch any cancer development as early as possible. Breast Cancer  Practice breast self-awareness. ? This means understanding how your breasts normally appear and feel. ? It also means doing regular breast self-exams. Let your health care provider know about any changes, no matter how small.  If you are 49 or older, have a clinician do a breast exam (clinical breast exam or CBE) every year. Depending on your age, family history, and medical history, it may be recommended that you also have a yearly breast X-ray (mammogram).  If you have a family history of breast cancer, talk with your health care provider about genetic screening.  If you are at high risk for breast cancer, talk with your health care provider about having an MRI and a mammogram every year.  Breast cancer (BRCA) gene test is recommended for women who have family members with BRCA-related cancers. Results of the assessment will determine the need for genetic counseling and BRCA1 and for BRCA2 testing. BRCA-related cancers include these types: ? Breast. This occurs in males or females. ? Ovarian. ? Tubal. This may also be called fallopian tube cancer. ? Cancer of the abdominal or pelvic lining (peritoneal cancer). ? Prostate. ? Pancreatic.  Cervical, Uterine, and Ovarian Cancer Your health care provider may  recommend that you be screened regularly for cancer of the pelvic organs. These include your ovaries, uterus, and vagina. This screening involves a pelvic exam, which includes checking for microscopic changes to the surface of your cervix (Pap test).  For women ages 21-65, health care providers may recommend a pelvic exam and a Pap test every three years. For women ages 3-65, they may recommend the Pap test and pelvic exam, combined with testing for human papilloma virus (HPV), every five years. Some types of HPV increase your risk of cervical cancer. Testing for HPV may also be done on women of any age who have unclear Pap test results.  Other health care providers may not recommend any screening for nonpregnant women who are considered low risk for pelvic cancer and have no symptoms. Ask your health care provider if a screening pelvic exam is right for you.  If you have had past treatment for cervical cancer or a condition that could lead to cancer, you need Pap tests and screening for cancer for at least 20 years after your treatment. If Pap tests have been discontinued for you, your risk factors (such as having a new sexual partner) need to be reassessed to determine if you should start having screenings again. Some women  have medical problems that increase the chance of getting cervical cancer. In these cases, your health care provider may recommend that you have screening and Pap tests more often.  If you have a family history of uterine cancer or ovarian cancer, talk with your health care provider about genetic screening.  If you have vaginal bleeding after reaching menopause, tell your health care provider.  There are currently no reliable tests available to screen for ovarian cancer.  Lung Cancer Lung cancer screening is recommended for adults 27-40 years old who are at high risk for lung cancer because of a history of smoking. A yearly low-dose CT scan of the lungs is recommended if  you:  Currently smoke.  Have a history of at least 30 pack-years of smoking and you currently smoke or have quit within the past 15 years. A pack-year is smoking an average of one pack of cigarettes per day for one year.  Yearly screening should:  Continue until it has been 15 years since you quit.  Stop if you develop a health problem that would prevent you from having lung cancer treatment.  Colorectal Cancer  This type of cancer can be detected and can often be prevented.  Routine colorectal cancer screening usually begins at age 52 and continues through age 56.  If you have risk factors for colon cancer, your health care provider may recommend that you be screened at an earlier age.  If you have a family history of colorectal cancer, talk with your health care provider about genetic screening.  Your health care provider may also recommend using home test kits to check for hidden blood in your stool.  A small camera at the end of a tube can be used to examine your colon directly (sigmoidoscopy or colonoscopy). This is done to check for the earliest forms of colorectal cancer.  Direct examination of the colon should be repeated every 5-10 years until age 73. However, if early forms of precancerous polyps or small growths are found or if you have a family history or genetic risk for colorectal cancer, you may need to be screened more often.  Skin Cancer  Check your skin from head to toe regularly.  Monitor any moles. Be sure to tell your health care provider: ? About any new moles or changes in moles, especially if there is a change in a mole's shape or color. ? If you have a mole that is larger than the size of a pencil eraser.  If any of your family members has a history of skin cancer, especially at a young age, talk with your health care provider about genetic screening.  Always use sunscreen. Apply sunscreen liberally and repeatedly throughout the day.  Whenever you are  outside, protect yourself by wearing long sleeves, pants, a wide-brimmed hat, and sunglasses.  What should I know about osteoporosis? Osteoporosis is a condition in which bone destruction happens more quickly than new bone creation. After menopause, you may be at an increased risk for osteoporosis. To help prevent osteoporosis or the bone fractures that can happen because of osteoporosis, the following is recommended:  If you are 71-70 years old, get at least 1,000 mg of calcium and at least 600 mg of vitamin D per day.  If you are older than age 32 but younger than age 20, get at least 1,200 mg of calcium and at least 600 mg of vitamin D per day.  If you are older than age 91, get at least  1,200 mg of calcium and at least 800 mg of vitamin D per day.  Smoking and excessive alcohol intake increase the risk of osteoporosis. Eat foods that are rich in calcium and vitamin D, and do weight-bearing exercises several times each week as directed by your health care provider. What should I know about how menopause affects my mental health? Depression may occur at any age, but it is more common as you become older. Common symptoms of depression include:  Low or sad mood.  Changes in sleep patterns.  Changes in appetite or eating patterns.  Feeling an overall lack of motivation or enjoyment of activities that you previously enjoyed.  Frequent crying spells.  Talk with your health care provider if you think that you are experiencing depression. What should I know about immunizations? It is important that you get and maintain your immunizations. These include:  Tetanus, diphtheria, and pertussis (Tdap) booster vaccine.  Influenza every year before the flu season begins.  Pneumonia vaccine.  Shingles vaccine.  Your health care provider may also recommend other immunizations. This information is not intended to replace advice given to you by your health care provider. Make sure you discuss  any questions you have with your health care provider. Document Released: 12/07/2005 Document Revised: 05/04/2016 Document Reviewed: 07/19/2015 Elsevier Interactive Patient Education  2018 [employer].

## 2017-06-23 NOTE — Assessment & Plan Note (Signed)
hgba1c acceptable, minimize simple carbs. Increase exercise as tolerated.  

## 2017-06-23 NOTE — Progress Notes (Signed)
Patient ID: [name], female   DOB: 03-08-1941, 76 y.o.   MRN: [id number]   Subjective:    Patient ID: [name], female    DOB: 06/21/1941, 76 y.o.   MRN: [id number]  Chief Complaint  Patient presents with  . Medicare Wellness    with RN    HPI Patient is in today for follow up. She continues to struggle with COPD and chronic respiratory failure and uses her O2 all night and day. She acknowledges her anxiety and depression have worsened as her health has failed. She denies suicidal ideation but endorses anhedonia. Denies CP/palp/HA/congestion/fevers/GI or GU c/o. Taking meds as prescribed  Past Medical History:  Diagnosis Date  . Anxiety   . Arthralgia   . Atrophic vaginitis 12/07/2012  . Chest pain   . Clostridium difficile diarrhea 12/27/2014  . COPD (chronic obstructive pulmonary disease) ([place])   . Depression   . GERD (gastroesophageal reflux disease)   . Glucose intolerance (impaired glucose tolerance)   . History of small bowel obstruction   . Hx of adenomatous colonic polyps   . Hyperglycemia 12/01/2013  . Hyperlipidemia 03/19/2017  . Hypertension   . Ischemic colitis ([place])   . Medicare annual wellness visit, subsequent 01/12/2016  . Migraine   . Nocturia 09/02/2013  . Osteoporosis   . Thyroid disease   . Weight loss     Past Surgical History:  Procedure Laterality Date  . ABDOMINAL HYSTERECTOMY  1993  . BLADDER REPAIR    . CHOLECYSTECTOMY    . COLONOSCOPY WITH PROPOFOL N/A 06/08/2013   Procedure: COLONOSCOPY WITH PROPOFOL;  Surgeon: Lafayette Dragon, MD;  Location: [facility];  Service: Endoscopy;  Laterality: N/A;  . DILATION AND CURETTAGE OF UTERUS     40 yrs ago  . EYE SURGERY  04/2011   bil. eyes  . TUBAL LIGATION      Family History  Problem Relation Age of Onset  . Alcohol abuse Mother   . Heart disease Mother   . Stroke Mother   . Hypertension Mother   . Kidney disease Mother   . COPD Mother   . Osteoporosis Mother        hip fracture    . COPD Brother   . Alcohol abuse Brother        drug abuse  . Hearing loss Brother        mvp  . Alcohol abuse Father        MVA  . Thyroid disease Daughter   . COPD Paternal Grandfather   . COPD Sister   . Hypertension Sister   . Anxiety disorder Sister   . COPD Sister   . COPD Brother        current smoker  . Hearing loss Brother        cad  . Prostate cancer Brother   . COPD Brother   . Colon cancer Paternal Aunt   . Prostate cancer Unknown     Social History   Social History  . Marital status: Married    Spouse name: N/A  . Number of children: 1  . Years of education: N/A   Occupational History  . Retired Hydrologist Retired   Social History Main Topics  . Smoking status: Former Smoker    Packs/day: 2.00    Years: 40.00    Types: Cigarettes    Quit date: 10/29/2001  . Smokeless tobacco: Never Used  . Alcohol use No  . Drug use:  No  . Sexual activity: No   Other Topics Concern  . Not on file   Social History Narrative  . No narrative on file    Outpatient Medications Prior to Visit  Medication Sig Dispense Refill  . albuterol (PROVENTIL HFA;VENTOLIN HFA) 108 (90 BASE) MCG/ACT inhaler Inhale 2 puffs into the lungs every 6 (six) hours as needed for wheezing or shortness of breath. 3 Inhaler 3  . albuterol (PROVENTIL) (2.5 MG/3ML) 0.083% nebulizer solution Take 3 mLs (2.5 mg total) by nebulization every 4 (four) hours as needed for wheezing or shortness of breath. Dx 496 300 mL 0  . ALPRAZolam (XANAX) 0.5 MG tablet TAKE 1 TABLET BY MOUTH TWICE DAILY AS NEEDED FOR ANXIETY 180 tablet 0  . arformoterol (BROVANA) 15 MCG/2ML NEBU Take 2 mLs (15 mcg total) by nebulization 2 (two) times daily. 360 mL 3  . budesonide (PULMICORT) 0.25 MG/2ML nebulizer solution Take 2 mLs (0.25 mg total) by nebulization 2 (two) times daily. Dx: J44.9 360 mL 3  . Calcium Carbonate-Vitamin D 600-400 MG-UNIT per tablet Take 1 tablet by mouth 2 (two) times daily.      .  Ferrous Fumarate-Folic Acid (HEMOCYTE-F) 324-1 MG TABS Take 1 tablet by mouth daily. 30 each 5  . fluticasone (FLONASE) 50 MCG/ACT nasal spray Place 2 sprays into both nostrils 2 (two) times daily as needed.     . Glucosamine-Chondroitin (GLUCOSAMINE CHONDR COMPLEX PO) Take 1 tablet by mouth 2 (two) times daily.     Marland Kitchen ipratropium (ATROVENT) 0.02 % nebulizer solution Take 2.5 mLs (0.5 mg total) by nebulization 4 (four) times daily. 900 mL 3  . metoprolol succinate (TOPROL-XL) 25 MG 24 hr tablet Take 1 tablet (25 mg total) by mouth daily. 90 tablet 3  . mirtazapine (REMERON) 15 MG tablet TAKE 1 TABLET(15 MG) BY MOUTH AT BEDTIME 30 tablet 5  . pantoprazole (PROTONIX) 40 MG tablet TAKE 1 TABLET(40 MG) BY MOUTH DAILY 90 tablet 1  . predniSONE (DELTASONE) 10 MG tablet Take 1 tablet (10 mg total) by mouth daily. 90 tablet 3  . citalopram (CELEXA) 20 MG tablet TAKE 1 TABLET(20 MG) BY MOUTH DAILY 30 tablet 5  . mirtazapine (REMERON) 15 MG tablet Take 1 tablet (15 mg total) by mouth at bedtime. 30 tablet 1  . ondansetron (ZOFRAN) 4 MG tablet Take 1 tablet (4 mg total) by mouth every 8 (eight) hours as needed for nausea or vomiting. (Patient not taking: Reported on 06/20/2017) 9 tablet 0  . ranitidine (ZANTAC) 300 MG capsule Take 1 capsule (300 mg total) by mouth every evening. (Patient not taking: Reported on 06/20/2017) 90 capsule 1   No facility-administered medications prior to visit.     Allergies  Allergen Reactions  . Morphine Other (See Comments)    REACTION: nightmares  . Ambien [Zolpidem] Anxiety  . Cymbalta [Duloxetine Hcl] Anxiety  . Nitrofurantoin Rash    Review of Systems  Constitutional: Positive for malaise/fatigue. Negative for fever.  HENT: Negative for congestion.   Eyes: Negative for blurred vision.  Respiratory: Positive for shortness of breath.   Cardiovascular: Negative for chest pain, palpitations and leg swelling.  Gastrointestinal: Negative for abdominal pain, blood in  stool and nausea.  Genitourinary: Negative for dysuria and frequency.  Musculoskeletal: Negative for falls.  Skin: Negative for rash.  Neurological: Negative for dizziness, loss of consciousness and headaches.  Endo/Heme/Allergies: Negative for environmental allergies.  Psychiatric/Behavioral: Positive for depression. The patient is nervous/anxious.  Objective:    Physical Exam  Constitutional: She is oriented to person, place, and time. She appears well-developed and well-nourished. No distress.  HENT:  Head: Normocephalic and atraumatic.  Nose: Nose normal.  Eyes: Right eye exhibits no discharge. Left eye exhibits no discharge.  Neck: Normal range of motion. Neck supple.  Cardiovascular: Normal rate and regular rhythm.   No murmur heard. Pulmonary/Chest: Effort normal and breath sounds normal.  O2 via [place]  Abdominal: Soft. Bowel sounds are normal. There is no tenderness.  Musculoskeletal: She exhibits no edema.  Neurological: She is alert and oriented to person, place, and time.  Skin: Skin is warm and dry.  Psychiatric: She has a normal mood and affect.  Nursing note and vitals reviewed.   BP 110/64 (BP Location: Right Arm, Patient Position: Sitting, Cuff Size: Normal)   Pulse 92   Ht 5\' 1"  (1.549 m)   Wt 95 lb 9.6 oz (43.4 kg)   SpO2 98% Comment: 3L via [place]  BMI 18.06 kg/m  Wt Readings from Last 3 Encounters:  06/20/17 95 lb 9.6 oz (43.4 kg)  06/13/17 93 lb (42.2 kg)  05/20/17 93 lb 9.6 oz (42.5 kg)     Lab Results  Component Value Date   WBC 11.7 (H) 06/07/2017   HGB 12.9 06/07/2017   HCT 41.4 06/07/2017   PLT 391 06/07/2017   GLUCOSE 101 (H) 06/07/2017   CHOL 200 (H) 06/07/2017   TRIG 200 (H) 06/07/2017   HDL 99 06/07/2017   LDLDIRECT 58.0 03/06/2017   LDLCALC 61 06/07/2017   ALT 21 06/07/2017   AST 26 06/07/2017   NA 142 06/07/2017   K 3.7 06/07/2017   CL 93 (L) 06/07/2017   CREATININE 0.50 (L) 06/07/2017   BUN 23 06/07/2017   CO2 38 (H)  06/07/2017   TSH 3.42 06/07/2017   HGBA1C 5.6 06/07/2017    Lab Results  Component Value Date   TSH 3.42 06/07/2017   Lab Results  Component Value Date   WBC 11.7 (H) 06/07/2017   HGB 12.9 06/07/2017   HCT 41.4 06/07/2017   MCV 95.8 06/07/2017   PLT 391 06/07/2017   Lab Results  Component Value Date   NA 142 06/07/2017   K 3.7 06/07/2017   CO2 38 (H) 06/07/2017   GLUCOSE 101 (H) 06/07/2017   BUN 23 06/07/2017   CREATININE 0.50 (L) 06/07/2017   BILITOT 0.3 06/07/2017   ALKPHOS 51 06/07/2017   AST 26 06/07/2017   ALT 21 06/07/2017   PROT 6.3 06/07/2017   ALBUMIN 4.1 06/07/2017   CALCIUM 9.1 06/07/2017   ANIONGAP 5 11/14/2014   GFR 114.63 03/06/2017   Lab Results  Component Value Date   CHOL 200 (H) 06/07/2017   Lab Results  Component Value Date   HDL 99 06/07/2017   Lab Results  Component Value Date   LDLCALC 61 06/07/2017   Lab Results  Component Value Date   TRIG 200 (H) 06/07/2017   Lab Results  Component Value Date   CHOLHDL 2.0 06/07/2017   Lab Results  Component Value Date   HGBA1C 5.6 06/07/2017       Assessment & Plan:   Problem List Items Addressed This Visit    Depression with anxiety    Struggling will increase his Citalopram to 40 mg daily and reassess.       HYPERTENSION, BENIGN ESSENTIAL    Well controlled, no changes to meds. Encouraged heart healthy diet such as the DASH diet and  exercise as tolerated.       Hyperglycemia    hgba1c acceptable, minimize simple carbs. Increase exercise as tolerated      Chronic respiratory failure ([place])    Is following closely with pulmonology and is using oxygen 24/7.        Other Visit Diagnoses    Breast cancer screening    -  Primary   Relevant Orders   MM DIGITAL SCREENING BILATERAL   Encounter for Medicare annual wellness exam          I have discontinued Ms. Cervini's citalopram. I am also having her start on citalopram. Additionally, I am having her maintain her Calcium  Carbonate-Vitamin D, Glucosamine-Chondroitin (GLUCOSAMINE CHONDR COMPLEX PO), albuterol, fluticasone, ondansetron, albuterol, Ferrous Fumarate-Folic Acid, arformoterol, budesonide, ipratropium, predniSONE, ranitidine, metoprolol succinate, mirtazapine, ALPRAZolam, and pantoprazole.  Meds ordered this encounter  Medications  . citalopram (CELEXA) 40 MG tablet    Sig: Take 1 tablet (40 mg total) by mouth daily.    Dispense:  90 tablet    Refill:  1     Penni Homans, MD

## 2017-06-23 NOTE — Assessment & Plan Note (Signed)
Struggling will increase his Citalopram to 40 mg daily and reassess.

## 2017-06-23 NOTE — Assessment & Plan Note (Signed)
Is following closely with pulmonology and is using oxygen 24/7.

## 2017-06-27 ENCOUNTER — Telehealth: Payer: Self-pay | Admitting: Adult Health

## 2017-06-27 DIAGNOSIS — J449 Chronic obstructive pulmonary disease, unspecified: Secondary | ICD-10-CM

## 2017-06-27 MED ORDER — BUDESONIDE 0.25 MG/2ML IN SUSP: 0.2500 mg | Freq: Two times a day (BID) | RESPIRATORY_TRACT | 3 refills | Status: DC

## 2017-06-27 MED ORDER — ARFORMOTEROL TARTRATE 15 MCG/2ML IN NEBU: 15.0000 ug | INHALATION_SOLUTION | Freq: Two times a day (BID) | RESPIRATORY_TRACT | 3 refills | Status: DC

## 2017-06-27 NOTE — Telephone Encounter (Signed)
Rx for Brovana and Pulmicort has been sent to preferred pharmacy per pt request. Nothing further needed.

## 2017-07-10 DIAGNOSIS — R0602 Shortness of breath: Secondary | ICD-10-CM | POA: Diagnosis not present

## 2017-07-16 ENCOUNTER — Telehealth: Payer: Self-pay | Admitting: Family Medicine

## 2017-07-16 NOTE — Telephone Encounter (Signed)
I have seen this form. She is stable on this med but the adivse is to drop the dose to 20 mg daily as a precaution against cardiac arrthymia. If she is hesitant to do so we would have to run an EKG on the current dose to assess further. Please check with patient.

## 2017-07-16 NOTE — Telephone Encounter (Signed)
Noted  I believe you have seen this form already.  Please advise

## 2017-07-16 NOTE — Telephone Encounter (Signed)
Caller name: Eduard Clos  Relation to pt: Spouse Call back number: [phone] Pharmacy:  Reason for call: Pt is needing rx of citalopram (CELEXA) 40 MG tablet but at the [place] they stated due to some clarification and guidelines for citalopram that are intended to reduce the risk of abnormal changes in the electrical activity of the heart, they are informing pts over 75 years of age should not exceed 20 mg per day of citalopram. Pt's spouse brought in a letter given the explanation of what the [place] is needing for the rx process. (Letter Given to [place]). Please advise.

## 2017-07-17 NOTE — Telephone Encounter (Signed)
LMOVM for pt to RTC to discuss as SB advises to decrease Citalopram to 20mg  for precaution but to RTC to discuss/thx dmf

## 2017-07-18 NOTE — Telephone Encounter (Signed)
Patient calling back, states she has tried for 2 days to get through our office, requesting call back at [phone]

## 2017-07-18 NOTE — Telephone Encounter (Signed)
SB-Patient has gone ahead and decreased the Citalopram to 20mg  since yesterday per my message I left her/she said that she will let us know how she is doing on the decreased dosage after a week or so/thx dmf

## 2017-07-21 DIAGNOSIS — J449 Chronic obstructive pulmonary disease, unspecified: Secondary | ICD-10-CM | POA: Diagnosis not present

## 2017-07-21 DIAGNOSIS — R0602 Shortness of breath: Secondary | ICD-10-CM | POA: Diagnosis not present

## 2017-07-21 DIAGNOSIS — J9611 Chronic respiratory failure with hypoxia: Secondary | ICD-10-CM | POA: Diagnosis not present

## 2017-07-21 DIAGNOSIS — M6281 Muscle weakness (generalized): Secondary | ICD-10-CM | POA: Diagnosis not present

## 2017-07-31 ENCOUNTER — Other Ambulatory Visit: Payer: Self-pay | Admitting: Family Medicine

## 2017-07-31 ENCOUNTER — Other Ambulatory Visit: Payer: Self-pay

## 2017-07-31 ENCOUNTER — Other Ambulatory Visit: Payer: Self-pay | Admitting: Pulmonary Disease

## 2017-07-31 DIAGNOSIS — F419 Anxiety disorder, unspecified: Principal | ICD-10-CM

## 2017-07-31 DIAGNOSIS — J449 Chronic obstructive pulmonary disease, unspecified: Secondary | ICD-10-CM

## 2017-07-31 DIAGNOSIS — R739 Hyperglycemia, unspecified: Secondary | ICD-10-CM

## 2017-07-31 DIAGNOSIS — F329 Major depressive disorder, single episode, unspecified: Secondary | ICD-10-CM

## 2017-07-31 DIAGNOSIS — I10 Essential (primary) hypertension: Secondary | ICD-10-CM

## 2017-07-31 DIAGNOSIS — E876 Hypokalemia: Secondary | ICD-10-CM

## 2017-07-31 DIAGNOSIS — K219 Gastro-esophageal reflux disease without esophagitis: Secondary | ICD-10-CM

## 2017-07-31 MED ORDER — PREDNISONE 10 MG PO TABS
10.0000 mg | ORAL_TABLET | Freq: Every day | ORAL | 3 refills | Status: DC
Start: 2017-07-31 — End: 2017-08-01

## 2017-08-01 ENCOUNTER — Other Ambulatory Visit: Payer: Self-pay

## 2017-08-01 MED ORDER — PREDNISONE 10 MG PO TABS: 10.0000 mg | ORAL_TABLET | Freq: Every day | ORAL | 3 refills | Status: DC

## 2017-08-07 ENCOUNTER — Other Ambulatory Visit: Payer: Self-pay

## 2017-08-07 DIAGNOSIS — J449 Chronic obstructive pulmonary disease, unspecified: Secondary | ICD-10-CM

## 2017-08-08 ENCOUNTER — Ambulatory Visit (INDEPENDENT_AMBULATORY_CARE_PROVIDER_SITE_OTHER): Payer: Medicare PPO | Admitting: Adult Health

## 2017-08-08 ENCOUNTER — Encounter: Payer: Self-pay | Admitting: Adult Health

## 2017-08-08 VITALS — BP 103/67 | HR 72 | Ht 61.0 in | Wt 98.0 lb

## 2017-08-08 DIAGNOSIS — J9610 Chronic respiratory failure, unspecified whether with hypoxia or hypercapnia: Secondary | ICD-10-CM

## 2017-08-08 DIAGNOSIS — Z23 Encounter for immunization: Secondary | ICD-10-CM

## 2017-08-08 DIAGNOSIS — J449 Chronic obstructive pulmonary disease, unspecified: Secondary | ICD-10-CM | POA: Diagnosis not present

## 2017-08-08 NOTE — Progress Notes (Signed)
@Patient  ID: Betha Loa, female    DOB: 1941/09/17, 76 y.o.   MRN: [id number]  Chief Complaint  Patient presents with  . Follow-up    copd     Referring provider: Mosie Lukes, MD  HPI: 76 yowf , heavy ex- smoker, quit 2002 with GOLD D COPD FEV1 26% 6/10 on 24 h O2 since 2009.  -completed pulm rehab oct '11  Recurrent flares 2-3/yr requiring steroid bursts  She uses 3 liters 24/7. On a regimen of budesonide/brovana &alb/atrovent nebs   Significant tests/ events Admitted 12/2012 for COPD exacerbation .ABG 7.39/55/75 on 3L [place]  She required low dose prednisone since her admit in 02/2013   Had colonoscopy with polyp removal 06/2013  08/2013 BL pneumonia  03/2014 ONO on 2L >>no sig desatn  CT chest 10/2014 neg PE  12/06/2014 >>2.5 L at rest , 3L on exertion - ONO ok in the past [facility]   08/08/2017 Follow up : COPD GOLD IV , O2 RF  Pt returns today for 2 month follow up . She remains weak and sob with minimal activity . Tells me that her breathing is getting worse gradually . She is not able to anything but sit for the most part. Going to bathroom wears her out . She gets winded eating , brushing teeth or using bathroom. Has very high anxiety , on celexa and xanax without much help. She loses her breath easily .  She denies increased cough or wheezing  She remains on Lipitor been nebulizer 4 times daily, probiotic budesonide. Neurologic twice daily. She is on prednisone 10mg  daily. Uses oxygen 2 L. At rest and 3 L with activity  Would like to get flu shot today .   We discussed severity of her COPD and stage of significant sob with very minimal activity .  Discussed hospice referral . She is in aggreement. Support provided.     Allergies  Allergen Reactions  . Morphine Other (See Comments)    REACTION: nightmares  . Ambien [Zolpidem] Anxiety  . Cymbalta [Duloxetine Hcl] Anxiety  . Nitrofurantoin Rash    Immunization  History  Administered Date(s) Administered  . H1N1 11/02/2008  . Influenza Split 07/31/2011  . Influenza Whole 09/22/2007, 08/18/2009, 08/02/2010  . Influenza, High Dose Seasonal PF 06/26/2016, 08/08/2017  . Influenza,inj,Quad PF,6+ Mos 07/14/2013, 08/05/2014, 07/14/2015  . Influenza-Unspecified 09/20/2012  . Pneumococcal Conjugate-13 08/10/2014  . Pneumococcal Polysaccharide-23 11/30/2003, 08/02/2010  . Td 12/14/2002  . Tdap 08/31/2013  . Zoster 01/26/2011    Past Medical History:  Diagnosis Date  . Anxiety   . Arthralgia   . Atrophic vaginitis 12/07/2012  . Chest pain   . Clostridium difficile diarrhea 12/27/2014  . COPD (chronic obstructive pulmonary disease) ([place])   . Depression   . GERD (gastroesophageal reflux disease)   . Glucose intolerance (impaired glucose tolerance)   . History of small bowel obstruction   . Hx of adenomatous colonic polyps   . Hyperglycemia 12/01/2013  . Hyperlipidemia 03/19/2017  . Hypertension   . Ischemic colitis ([place])   . Medicare annual wellness visit, subsequent 01/12/2016  . Migraine   . Nocturia 09/02/2013  . Osteoporosis   . Thyroid disease   . Weight loss     Tobacco History: History  Smoking Status  . Former Smoker  . Packs/day: 2.00  . Years: 40.00  . Types: Cigarettes  . Quit date: 10/29/2001  Smokeless Tobacco  . Never Used   Counseling given:  Not Answered   Outpatient Encounter Prescriptions as of 08/08/2017  Medication Sig  . albuterol (PROVENTIL HFA;VENTOLIN HFA) 108 (90 BASE) MCG/ACT inhaler Inhale 2 puffs into the lungs every 6 (six) hours as needed for wheezing or shortness of breath.  Marland Kitchen albuterol (PROVENTIL) (2.5 MG/3ML) 0.083% nebulizer solution Take 3 mLs (2.5 mg total) by nebulization every 4 (four) hours as needed for wheezing or shortness of breath. Dx 496  . ALPRAZolam (XANAX) 0.5 MG tablet TAKE 1 TABLET BY MOUTH TWICE DAILY AS NEEDED FOR ANXIETY  . arformoterol (BROVANA) 15 MCG/2ML NEBU Take 2 mLs (15 mcg total)  by nebulization 2 (two) times daily. Dx J44.9  . budesonide (PULMICORT) 0.25 MG/2ML nebulizer solution Take 2 mLs (0.25 mg total) by nebulization 2 (two) times daily. Dx: J44.9  . Calcium Carbonate-Vitamin D 600-400 MG-UNIT per tablet Take 1 tablet by mouth 2 (two) times daily.    . citalopram (CELEXA) 40 MG tablet Take 1 tablet (40 mg total) by mouth daily. (Patient taking differently: Take 20 mg by mouth daily. )  . Ferrous Fumarate-Folic Acid (HEMOCYTE-F) 324-1 MG TABS Take 1 tablet by mouth daily.  . fluticasone (FLONASE) 50 MCG/ACT nasal spray Place 2 sprays into both nostrils 2 (two) times daily as needed.   . Glucosamine-Chondroitin (GLUCOSAMINE CHONDR COMPLEX PO) Take 1 tablet by mouth 2 (two) times daily.   Marland Kitchen ipratropium (ATROVENT) 0.02 % nebulizer solution Take 2.5 mLs (0.5 mg total) by nebulization 4 (four) times daily.  . metoprolol succinate (TOPROL-XL) 25 MG 24 hr tablet Take 1 tablet (25 mg total) by mouth daily.  . mirtazapine (REMERON) 15 MG tablet TAKE 1 TABLET(15 MG) BY MOUTH AT BEDTIME  . pantoprazole (PROTONIX) 40 MG tablet TAKE 1 TABLET(40 MG) BY MOUTH DAILY  . predniSONE (DELTASONE) 10 MG tablet Take 1 tablet (10 mg total) by mouth daily.  . [DISCONTINUED] ondansetron (ZOFRAN) 4 MG tablet Take 1 tablet (4 mg total) by mouth every 8 (eight) hours as needed for nausea or vomiting. (Patient not taking: Reported on 06/20/2017)  . [DISCONTINUED] ranitidine (ZANTAC) 300 MG capsule Take 1 capsule (300 mg total) by mouth every evening. (Patient not taking: Reported on 06/20/2017)   No facility-administered encounter medications on file as of 08/08/2017.      Review of Systems  Constitutional:   No  weight loss, night sweats,  Fevers, chills,  +fatigue, or  lassitude.  HEENT:   No headaches,  Difficulty swallowing,  Tooth/dental problems, or  Sore throat,                No sneezing, itching, ear ache, nasal congestion, post nasal drip,   CV:  No chest pain,  Orthopnea, PND,  swelling in lower extremities, anasarca, dizziness, palpitations, syncope.   GI  No heartburn, indigestion, abdominal pain, nausea, vomiting, diarrhea, change in bowel habits, loss of appetite, bloody stools.   Resp:  ,  No non-productive cough,  No coughing up of blood.  No change in color of mucus.  No wheezing.  No chest wall deformity  Skin: no rash or lesions.  GU: no dysuria, change in color of urine, no urgency or frequency.  No flank pain, no hematuria   MS:  No joint pain or swelling.  No decreased range of motion.  No back pain.    Physical Exam  BP 103/67 (BP Location: Left Arm, Patient Position: Sitting, Cuff Size: Normal)   Pulse 72   Ht 5\' 1"  (1.549 m)   Wt 98 lb (  44.5 kg)   SpO2 97%   BMI 18.52 kg/m   GEN: A/Ox3; pleasant , NAD, thin and frail    HEENT:  [place]/AT,  EACs-clear, TMs-wnl, NOSE-clear, THROAT-clear, no lesions, no postnasal drip or exudate noted.   NECK:  Supple w/ fair ROM; no JVD; normal carotid impulses w/o bruits; no thyromegaly or nodules palpated; no lymphadenopathy.    RESP  Decreased BS in bases no accessory muscle use, no dullness to percussion  CARD:  RRR, no m/r/g, no peripheral edema, pulses intact, no cyanosis or clubbing.  GI:   Soft & nt; nml bowel sounds; no organomegaly or masses detected.   Musco: Warm bil, no deformities or joint swelling noted.   Neuro: alert, no focal deficits noted.    Skin: Warm, no lesions or rashes     Imaging: No results found.   Assessment & Plan:   COPD mixed type ([place]) Very Severe COPD /GOLD IV with siginficant symptom burden despite maximum treatment regimen including chronic steroids .  Will increase prednisone 20mg  daily .  Refer to hospice to help with sx management .   Plan  Patient Instructions  Continue on Ipratropium Neb Four times a day   Increase Prednisone 20mg  . daily  Continue on Brovana and Budesonide Neb Twice daily  .  Continue on 2 L rest and 3l/m activity .  Flu shot  today  Referral to hospice .  Follow up in 2-3 months with Dr. Elsworth Soho  Or Parrett in [facility]  Please contact office for sooner follow up if symptoms do not improve or worsen or seek emergency care       Chronic respiratory failure Cont on O2      Tammy Parrett, NP 08/08/2017

## 2017-08-08 NOTE — Assessment & Plan Note (Signed)
Cont on O2 .  

## 2017-08-08 NOTE — Assessment & Plan Note (Signed)
Very Severe COPD /GOLD IV with siginficant symptom burden despite maximum treatment regimen including chronic steroids .  Will increase prednisone 20mg  daily .  Refer to hospice to help with sx management .   Plan  Patient Instructions  Continue on Ipratropium Neb Four times a day   Increase Prednisone 20mg  . daily  Continue on Brovana and Budesonide Neb Twice daily  .  Continue on 2 L rest and 3l/m activity .  Flu shot today  Referral to hospice .  Follow up in 2-3 months with Dr. Elsworth Soho  Or Dannon Perlow in [facility]  Please contact office for sooner follow up if symptoms do not improve or worsen or seek emergency care

## 2017-08-08 NOTE — Patient Instructions (Signed)
Continue on Ipratropium Neb Four times a day   Increase Prednisone 20mg  . daily  Continue on Brovana and Budesonide Neb Twice daily  .  Continue on 2 L rest and 3l/m activity .  Flu shot today  Referral to hospice .  Follow up in 2-3 months with Dr. Elsworth Soho  Or Parrett in [facility]  Please contact office for sooner follow up if symptoms do not improve or worsen or seek emergency care

## 2017-08-09 DIAGNOSIS — R0602 Shortness of breath: Secondary | ICD-10-CM | POA: Diagnosis not present

## 2017-08-12 ENCOUNTER — Other Ambulatory Visit: Payer: Self-pay

## 2017-08-12 DIAGNOSIS — F329 Major depressive disorder, single episode, unspecified: Secondary | ICD-10-CM

## 2017-08-12 DIAGNOSIS — E876 Hypokalemia: Secondary | ICD-10-CM

## 2017-08-12 DIAGNOSIS — J449 Chronic obstructive pulmonary disease, unspecified: Secondary | ICD-10-CM

## 2017-08-12 DIAGNOSIS — F419 Anxiety disorder, unspecified: Principal | ICD-10-CM

## 2017-08-12 DIAGNOSIS — K219 Gastro-esophageal reflux disease without esophagitis: Secondary | ICD-10-CM

## 2017-08-12 DIAGNOSIS — I10 Essential (primary) hypertension: Secondary | ICD-10-CM

## 2017-08-12 DIAGNOSIS — R739 Hyperglycemia, unspecified: Secondary | ICD-10-CM

## 2017-08-12 MED ORDER — ALPRAZOLAM 0.5 MG PO TABS: 0.5000 mg | ORAL_TABLET | Freq: Two times a day (BID) | ORAL | 0 refills | Status: DC | PRN

## 2017-08-12 NOTE — Telephone Encounter (Signed)
[name] Self [phone]  [facility] - [place], [place] - [address] AT [place] [phone] (Phone) [phone] (Fax)   ALPRAZolam Duanne Moron) 0.5 MG tablet   [name] called to say she is concerned she has not received this refill, she is now down to only pieces. Please call and advise.

## 2017-08-13 ENCOUNTER — Other Ambulatory Visit: Payer: Self-pay

## 2017-08-13 DIAGNOSIS — K219 Gastro-esophageal reflux disease without esophagitis: Secondary | ICD-10-CM

## 2017-08-13 DIAGNOSIS — J449 Chronic obstructive pulmonary disease, unspecified: Secondary | ICD-10-CM

## 2017-08-13 DIAGNOSIS — R739 Hyperglycemia, unspecified: Secondary | ICD-10-CM

## 2017-08-13 DIAGNOSIS — E876 Hypokalemia: Secondary | ICD-10-CM

## 2017-08-13 DIAGNOSIS — F329 Major depressive disorder, single episode, unspecified: Secondary | ICD-10-CM

## 2017-08-13 DIAGNOSIS — F419 Anxiety disorder, unspecified: Principal | ICD-10-CM

## 2017-08-13 DIAGNOSIS — I10 Essential (primary) hypertension: Secondary | ICD-10-CM

## 2017-08-13 DIAGNOSIS — F32A Depression, unspecified: Secondary | ICD-10-CM

## 2017-08-13 MED ORDER — ALPRAZOLAM 0.5 MG PO TABS: 0.5000 mg | ORAL_TABLET | Freq: Two times a day (BID) | ORAL | 0 refills | Status: DC | PRN

## 2017-08-13 NOTE — Telephone Encounter (Signed)
Spoke with patient and apologize for the inconvenience. I have sent in her RX to the [facility]

## 2017-08-14 ENCOUNTER — Telehealth: Payer: Self-pay | Admitting: Adult Health

## 2017-08-14 NOTE — Progress Notes (Signed)
Reviewed & agree with plan  

## 2017-08-14 NOTE — Telephone Encounter (Signed)
We can change to inhaler if they will cover or can they just continue them through private insurance . Not sure how this works. ?

## 2017-08-14 NOTE — Telephone Encounter (Signed)
Spoke with the pt's spouse, [name] He states that Hospice is not going to cover her Garlon Hatchet and Pulmicort  Do you want to leave these off or prescribe something else in place of these? Please advise, thanks!

## 2017-08-15 ENCOUNTER — Other Ambulatory Visit: Payer: Self-pay | Admitting: Family Medicine

## 2017-08-15 ENCOUNTER — Ambulatory Visit (HOSPITAL_BASED_OUTPATIENT_CLINIC_OR_DEPARTMENT_OTHER)
Admission: RE | Admit: 2017-08-15 | Discharge: 2017-08-15 | Disposition: A | Discharge status: Home / Self Care | Payer: Medicare PPO | Source: Ambulatory Visit | Attending: Family Medicine | Admitting: Family Medicine

## 2017-08-15 DIAGNOSIS — Z1231 Encounter for screening mammogram for malignant neoplasm of breast: Secondary | ICD-10-CM | POA: Insufficient documentation

## 2017-08-15 DIAGNOSIS — Z1239 Encounter for other screening for malignant neoplasm of breast: Secondary | ICD-10-CM

## 2017-08-15 NOTE — Telephone Encounter (Signed)
Called spoke with patient's spouse Eduard Clos to see if he has a name and number for pt's Hospice nurse Per Eduard Clos pt has not yet been assigned a regular Hospice nurse but Pam came  Will call the Hospice main number @ [phone]  Called spoke Horris Latino at [facility] who reports pt refused Hospice on 10.12.18 stating that she wanted to review all her options first before deciding to proceed with Hospice and this may well be why the nebs are being paid for under Hospice  Called spoke with spouse Eduard Clos to discuss.  Per Eduard Clos, when Pam came to pt's home to do the assessment pt was told that she does not have to make a decision now and can take up to 2 weeks to decide.  Per Eduard Clos, the intention the entire time was to proceed with Hospice.  Livia Snellen will call Hospice back to let them know and see how we need to proceed.  Eduard Clos is aware will have Hospice contact them regarding accepting Hospice services and pt's medications.  Called Hospice again and spoke with Dewaine Oats who reported that the above is indeed considered an denial of services but she will call patient and schedule for a nurse to go back out to the home.  Asked Dewaine Oats if she knows if Brovana and Budesonide are on their formulary - she does not, but the nurse will be able to go over all of patient's medications and will call this office if anything is not covered so this can be changed.  Will go ahead and sign off on message and await call from Hospice if nebulizers need to be changed.

## 2017-08-16 ENCOUNTER — Telehealth: Payer: Self-pay | Admitting: Adult Health

## 2017-08-16 DIAGNOSIS — F339 Major depressive disorder, recurrent, unspecified: Secondary | ICD-10-CM | POA: Diagnosis not present

## 2017-08-16 DIAGNOSIS — I10 Essential (primary) hypertension: Secondary | ICD-10-CM | POA: Diagnosis not present

## 2017-08-16 DIAGNOSIS — K219 Gastro-esophageal reflux disease without esophagitis: Secondary | ICD-10-CM | POA: Diagnosis not present

## 2017-08-16 DIAGNOSIS — J449 Chronic obstructive pulmonary disease, unspecified: Secondary | ICD-10-CM | POA: Diagnosis not present

## 2017-08-16 DIAGNOSIS — J301 Allergic rhinitis due to pollen: Secondary | ICD-10-CM | POA: Diagnosis not present

## 2017-08-16 NOTE — Telephone Encounter (Signed)
Provide her with duonebs q6h prn STay on pulmicort + brovana

## 2017-08-16 NOTE — Telephone Encounter (Signed)
[name], Hospice, [phone] returning call.  Dr. Elsworth Soho wanted to know what meds were not on formulary, states Brovona and Pulmicort.  Albuterol nebulizer solution was years out of date as pt had not been using it and patient threw this away.  States their doctor recommended Duoneb.  States patient has a good supply of meds on her formulary so not in need of any right away.

## 2017-08-16 NOTE — Telephone Encounter (Signed)
Called and spoke with Pam from Hospice and she stated that the pt was recently admitted to their service and she has the albuterol nebulizer on her list of meds, but does not have this in the home---this expired in 2016.    Pam wanted to see if RA wanted to do the plain albuterol or change her to duoneb.    She also stated that the pulmicort and the brovanna is not covered by Hospice---but the pt does have a good supply of these meds.  Would like to know how RA wanted to proceed with this.  Please advise. Thanks

## 2017-08-16 NOTE — Telephone Encounter (Signed)
I have called and lmom for Pam to call us back about this pt.

## 2017-08-19 DIAGNOSIS — I10 Essential (primary) hypertension: Secondary | ICD-10-CM | POA: Diagnosis not present

## 2017-08-19 DIAGNOSIS — K219 Gastro-esophageal reflux disease without esophagitis: Secondary | ICD-10-CM | POA: Diagnosis not present

## 2017-08-19 DIAGNOSIS — F339 Major depressive disorder, recurrent, unspecified: Secondary | ICD-10-CM | POA: Diagnosis not present

## 2017-08-19 DIAGNOSIS — J449 Chronic obstructive pulmonary disease, unspecified: Secondary | ICD-10-CM | POA: Diagnosis not present

## 2017-08-19 DIAGNOSIS — J301 Allergic rhinitis due to pollen: Secondary | ICD-10-CM | POA: Diagnosis not present

## 2017-08-19 MED ORDER — IPRATROPIUM-ALBUTEROL 0.5-2.5 (3) MG/3ML IN SOLN: 3.0000 mL | Freq: Four times a day (QID) | RESPIRATORY_TRACT | 4 refills | Status: DC | PRN

## 2017-08-19 NOTE — Telephone Encounter (Signed)
lmomtcb x 2 for Pam

## 2017-08-19 NOTE — Telephone Encounter (Signed)
Hospice Kenney Houseman [phone]) call to follow up on this request

## 2017-08-19 NOTE — Telephone Encounter (Signed)
Spoke with [place]. Advised her of RA's plan. She verbalized understanding. She stated that the patient will need a RX for the DuoNeb to be sent to [facility] on [address]. Advised her that I can go ahead and call this in.   Nothing else needed at time of call.

## 2017-08-27 DIAGNOSIS — J449 Chronic obstructive pulmonary disease, unspecified: Secondary | ICD-10-CM | POA: Diagnosis not present

## 2017-08-27 DIAGNOSIS — J301 Allergic rhinitis due to pollen: Secondary | ICD-10-CM | POA: Diagnosis not present

## 2017-08-27 DIAGNOSIS — K219 Gastro-esophageal reflux disease without esophagitis: Secondary | ICD-10-CM | POA: Diagnosis not present

## 2017-08-27 DIAGNOSIS — I10 Essential (primary) hypertension: Secondary | ICD-10-CM | POA: Diagnosis not present

## 2017-08-27 DIAGNOSIS — F339 Major depressive disorder, recurrent, unspecified: Secondary | ICD-10-CM | POA: Diagnosis not present

## 2017-08-29 DIAGNOSIS — F339 Major depressive disorder, recurrent, unspecified: Secondary | ICD-10-CM | POA: Diagnosis not present

## 2017-08-29 DIAGNOSIS — K219 Gastro-esophageal reflux disease without esophagitis: Secondary | ICD-10-CM | POA: Diagnosis not present

## 2017-08-29 DIAGNOSIS — J449 Chronic obstructive pulmonary disease, unspecified: Secondary | ICD-10-CM | POA: Diagnosis not present

## 2017-08-29 DIAGNOSIS — I10 Essential (primary) hypertension: Secondary | ICD-10-CM | POA: Diagnosis not present

## 2017-08-29 DIAGNOSIS — J301 Allergic rhinitis due to pollen: Secondary | ICD-10-CM | POA: Diagnosis not present

## 2017-08-30 DIAGNOSIS — F339 Major depressive disorder, recurrent, unspecified: Secondary | ICD-10-CM | POA: Diagnosis not present

## 2017-08-30 DIAGNOSIS — J449 Chronic obstructive pulmonary disease, unspecified: Secondary | ICD-10-CM | POA: Diagnosis not present

## 2017-08-30 DIAGNOSIS — K219 Gastro-esophageal reflux disease without esophagitis: Secondary | ICD-10-CM | POA: Diagnosis not present

## 2017-08-30 DIAGNOSIS — J301 Allergic rhinitis due to pollen: Secondary | ICD-10-CM | POA: Diagnosis not present

## 2017-08-30 DIAGNOSIS — I10 Essential (primary) hypertension: Secondary | ICD-10-CM | POA: Diagnosis not present

## 2017-09-03 DIAGNOSIS — J301 Allergic rhinitis due to pollen: Secondary | ICD-10-CM | POA: Diagnosis not present

## 2017-09-03 DIAGNOSIS — J449 Chronic obstructive pulmonary disease, unspecified: Secondary | ICD-10-CM | POA: Diagnosis not present

## 2017-09-03 DIAGNOSIS — F339 Major depressive disorder, recurrent, unspecified: Secondary | ICD-10-CM | POA: Diagnosis not present

## 2017-09-03 DIAGNOSIS — I10 Essential (primary) hypertension: Secondary | ICD-10-CM | POA: Diagnosis not present

## 2017-09-03 DIAGNOSIS — K219 Gastro-esophageal reflux disease without esophagitis: Secondary | ICD-10-CM | POA: Diagnosis not present

## 2017-09-06 DIAGNOSIS — K219 Gastro-esophageal reflux disease without esophagitis: Secondary | ICD-10-CM | POA: Diagnosis not present

## 2017-09-06 DIAGNOSIS — F339 Major depressive disorder, recurrent, unspecified: Secondary | ICD-10-CM | POA: Diagnosis not present

## 2017-09-06 DIAGNOSIS — J301 Allergic rhinitis due to pollen: Secondary | ICD-10-CM | POA: Diagnosis not present

## 2017-09-06 DIAGNOSIS — J449 Chronic obstructive pulmonary disease, unspecified: Secondary | ICD-10-CM | POA: Diagnosis not present

## 2017-09-06 DIAGNOSIS — I10 Essential (primary) hypertension: Secondary | ICD-10-CM | POA: Diagnosis not present

## 2017-09-10 DIAGNOSIS — J449 Chronic obstructive pulmonary disease, unspecified: Secondary | ICD-10-CM | POA: Diagnosis not present

## 2017-09-10 DIAGNOSIS — I10 Essential (primary) hypertension: Secondary | ICD-10-CM | POA: Diagnosis not present

## 2017-09-10 DIAGNOSIS — J301 Allergic rhinitis due to pollen: Secondary | ICD-10-CM | POA: Diagnosis not present

## 2017-09-10 DIAGNOSIS — K219 Gastro-esophageal reflux disease without esophagitis: Secondary | ICD-10-CM | POA: Diagnosis not present

## 2017-09-10 DIAGNOSIS — F339 Major depressive disorder, recurrent, unspecified: Secondary | ICD-10-CM | POA: Diagnosis not present

## 2017-09-18 DIAGNOSIS — I10 Essential (primary) hypertension: Secondary | ICD-10-CM | POA: Diagnosis not present

## 2017-09-18 DIAGNOSIS — F339 Major depressive disorder, recurrent, unspecified: Secondary | ICD-10-CM | POA: Diagnosis not present

## 2017-09-18 DIAGNOSIS — J449 Chronic obstructive pulmonary disease, unspecified: Secondary | ICD-10-CM | POA: Diagnosis not present

## 2017-09-18 DIAGNOSIS — J301 Allergic rhinitis due to pollen: Secondary | ICD-10-CM | POA: Diagnosis not present

## 2017-09-18 DIAGNOSIS — K219 Gastro-esophageal reflux disease without esophagitis: Secondary | ICD-10-CM | POA: Diagnosis not present

## 2017-09-24 ENCOUNTER — Other Ambulatory Visit: Payer: Self-pay | Admitting: Family Medicine

## 2017-09-24 DIAGNOSIS — J449 Chronic obstructive pulmonary disease, unspecified: Secondary | ICD-10-CM | POA: Diagnosis not present

## 2017-09-24 DIAGNOSIS — J301 Allergic rhinitis due to pollen: Secondary | ICD-10-CM | POA: Diagnosis not present

## 2017-09-24 DIAGNOSIS — K219 Gastro-esophageal reflux disease without esophagitis: Secondary | ICD-10-CM | POA: Diagnosis not present

## 2017-09-24 DIAGNOSIS — I10 Essential (primary) hypertension: Secondary | ICD-10-CM | POA: Diagnosis not present

## 2017-09-24 DIAGNOSIS — F339 Major depressive disorder, recurrent, unspecified: Secondary | ICD-10-CM | POA: Diagnosis not present

## 2017-09-26 ENCOUNTER — Telehealth: Payer: Self-pay | Admitting: Adult Health

## 2017-09-26 ENCOUNTER — Telehealth: Payer: Self-pay | Admitting: *Deleted

## 2017-09-26 MED ORDER — PREDNISONE 20 MG PO TABS: 20.0000 mg | ORAL_TABLET | Freq: Every day | ORAL | 2 refills | Status: AC

## 2017-09-26 NOTE — Telephone Encounter (Signed)
Called and spoke to Wil[place] with [facility]. Bridgette states pt was un the impression that she should be taking prednisone bid.  Per last OV note TP states prednisone will be increased to 20mg  daily.  I have sent in new Rx for Prednisone 20mg , [facility] 10mg  on file.  Nothing further is needed.   Plan  Patient Instructions  Continue on Ipratropium Neb Four times a day   Increase Prednisone 20mg  . daily  Continue on Brovana and Budesonide Neb Twice daily  .  Continue on 2 L rest and 3l/m activity .  Flu shot today  Referral to hospice .  Follow up in 2-3 months with Dr. Elsworth Soho  Or Parrett in [facility]  Please contact office for sooner follow up if symptoms do not improve or worsen or seek emergency care

## 2017-09-26 NOTE — Telephone Encounter (Signed)
Received Physician Orders from Hospice at [facility]; forwarded to provider/SLS 11/29

## 2017-09-28 DIAGNOSIS — J449 Chronic obstructive pulmonary disease, unspecified: Secondary | ICD-10-CM | POA: Diagnosis not present

## 2017-09-28 DIAGNOSIS — K219 Gastro-esophageal reflux disease without esophagitis: Secondary | ICD-10-CM | POA: Diagnosis not present

## 2017-09-28 DIAGNOSIS — F339 Major depressive disorder, recurrent, unspecified: Secondary | ICD-10-CM | POA: Diagnosis not present

## 2017-09-28 DIAGNOSIS — I10 Essential (primary) hypertension: Secondary | ICD-10-CM | POA: Diagnosis not present

## 2017-09-28 DIAGNOSIS — J301 Allergic rhinitis due to pollen: Secondary | ICD-10-CM | POA: Diagnosis not present

## 2017-10-02 DIAGNOSIS — J301 Allergic rhinitis due to pollen: Secondary | ICD-10-CM | POA: Diagnosis not present

## 2017-10-02 DIAGNOSIS — F339 Major depressive disorder, recurrent, unspecified: Secondary | ICD-10-CM | POA: Diagnosis not present

## 2017-10-02 DIAGNOSIS — I10 Essential (primary) hypertension: Secondary | ICD-10-CM | POA: Diagnosis not present

## 2017-10-02 DIAGNOSIS — K219 Gastro-esophageal reflux disease without esophagitis: Secondary | ICD-10-CM | POA: Diagnosis not present

## 2017-10-02 DIAGNOSIS — J449 Chronic obstructive pulmonary disease, unspecified: Secondary | ICD-10-CM | POA: Diagnosis not present

## 2017-10-03 ENCOUNTER — Encounter: Payer: Self-pay | Admitting: Adult Health

## 2017-10-03 ENCOUNTER — Ambulatory Visit (INDEPENDENT_AMBULATORY_CARE_PROVIDER_SITE_OTHER): Admitting: Adult Health

## 2017-10-03 DIAGNOSIS — J449 Chronic obstructive pulmonary disease, unspecified: Secondary | ICD-10-CM

## 2017-10-03 DIAGNOSIS — J9611 Chronic respiratory failure with hypoxia: Secondary | ICD-10-CM | POA: Diagnosis not present

## 2017-10-03 DIAGNOSIS — I10 Essential (primary) hypertension: Secondary | ICD-10-CM | POA: Diagnosis not present

## 2017-10-03 DIAGNOSIS — F339 Major depressive disorder, recurrent, unspecified: Secondary | ICD-10-CM | POA: Diagnosis not present

## 2017-10-03 DIAGNOSIS — K219 Gastro-esophageal reflux disease without esophagitis: Secondary | ICD-10-CM | POA: Diagnosis not present

## 2017-10-03 DIAGNOSIS — J301 Allergic rhinitis due to pollen: Secondary | ICD-10-CM | POA: Diagnosis not present

## 2017-10-03 NOTE — Progress Notes (Signed)
@Patient  ID: [name], female    DOB: 1941/08/11, 76 y.o.   MRN: [id number]  Chief Complaint  Patient presents with  . Follow-up    COPD    Referring provider: Mosie Lukes, MD  HPI: 49 yowf , heavy ex- smoker, quit 2002 with GOLD D COPD FEV1 26% 6/10 on 24 h O2 since 2009.  -completed pulm rehab oct '11  Recurrent flares 2-3/yr requiring steroid bursts  She uses 3 liters 24/7. On a regimen of budesonide/brovana &alb/atrovent nebs   Significant tests/ events Admitted 12/2012 for COPD exacerbation .ABG 7.39/55/75 on 3L [place]  She required low dose prednisone since her admit in 02/2013   Had colonoscopy with polyp removal 06/2013  08/2013 BL pneumonia  03/2014 ONO on 2L >>no sig desatn  CT chest 10/2014 neg PE  12/06/2014 >>2.5 L at rest , 3L on exertion - ONO ok in the past [facility]   Hospice referral 07/2017   10/03/2017 Follow up ; COPD , O2 RF  Pt returns for a 2 month follow up for COPD and O2 RF . Last ov , her COPD has been progressively declining for last couple of years. Noted increased dyspnea at rest , unable to do minimal activity without significant dyspnea and anxiety . Husband main caregiver and he is on O2.  She is , Pulmicort and Brovana nebs. She was changed to duoneb Four times a day  .  She is on chronic steroids , prednisone 20mg  daily. She is on Oxygen 2l/m rest and 3l/m acticity .  Flu shot is utd.  Since last ov she is doing okay. Not much has changed with breathing . Does feel more secure since hospice started. Really likes their program.  No increased cough . No fever, chest pain, orthopnea or edema.  Appetite is fair.       Allergies  Allergen Reactions  . Morphine Other (See Comments)    REACTION: nightmares  . Ambien [Zolpidem] Anxiety  . Cymbalta [Duloxetine Hcl] Anxiety  . Nitrofurantoin Rash    Immunization History  Administered Date(s) Administered  . H1N1 11/02/2008  . Influenza  Split 07/31/2011  . Influenza Whole 09/22/2007, 08/18/2009, 08/02/2010  . Influenza, High Dose Seasonal PF 06/26/2016, 08/08/2017  . Influenza,inj,Quad PF,6+ Mos 07/14/2013, 08/05/2014, 07/14/2015  . Influenza-Unspecified 09/20/2012  . Pneumococcal Conjugate-13 08/10/2014  . Pneumococcal Polysaccharide-23 11/30/2003, 08/02/2010  . Td 12/14/2002  . Tdap 08/31/2013  . Zoster 01/26/2011    Past Medical History:  Diagnosis Date  . Anxiety   . Arthralgia   . Atrophic vaginitis 12/07/2012  . Chest pain   . Clostridium difficile diarrhea 12/27/2014  . COPD (chronic obstructive pulmonary disease) ([place])   . Depression   . GERD (gastroesophageal reflux disease)   . Glucose intolerance (impaired glucose tolerance)   . History of small bowel obstruction   . Hx of adenomatous colonic polyps   . Hyperglycemia 12/01/2013  . Hyperlipidemia 03/19/2017  . Hypertension   . Ischemic colitis ([place])   . Medicare annual wellness visit, subsequent 01/12/2016  . Migraine   . Nocturia 09/02/2013  . Osteoporosis   . Thyroid disease   . Weight loss     Tobacco History: Social History   Tobacco Use  Smoking Status Former Smoker  . Packs/day: 2.00  . Years: 40.00  . Pack years: 80.00  . Types: Cigarettes  . Last attempt to quit: 10/29/2001  . Years since quitting: 15.9  Smokeless Tobacco Never  Used   Counseling given: Not Answered   Outpatient Encounter Medications as of 10/03/2017  Medication Sig  . albuterol (PROVENTIL HFA;VENTOLIN HFA) 108 (90 BASE) MCG/ACT inhaler Inhale 2 puffs into the lungs every 6 (six) hours as needed for wheezing or shortness of breath.  . ALPRAZolam (XANAX) 0.5 MG tablet Take 1 tablet (0.5 mg total) by mouth 2 (two) times daily as needed. for anxiety  . arformoterol (BROVANA) 15 MCG/2ML NEBU Take 2 mLs (15 mcg total) by nebulization 2 (two) times daily. Dx J44.9  . budesonide (PULMICORT) 0.25 MG/2ML nebulizer solution Take 2 mLs (0.25 mg total) by nebulization 2 (two)  times daily. Dx: J44.9  . Calcium Carbonate-Vitamin D 600-400 MG-UNIT per tablet Take 1 tablet by mouth 2 (two) times daily.    . citalopram (CELEXA) 40 MG tablet Take 1 tablet (40 mg total) by mouth daily. (Patient taking differently: Take 20 mg by mouth daily. )  . Ferrous Fumarate-Folic Acid (HEMOCYTE-F) 324-1 MG TABS Take 1 tablet by mouth daily.  . fluticasone (FLONASE) 50 MCG/ACT nasal spray Place 2 sprays into both nostrils 2 (two) times daily as needed.   . Glucosamine-Chondroitin (GLUCOSAMINE CHONDR COMPLEX PO) Take 1 tablet by mouth 2 (two) times daily.   Marland Kitchen ipratropium (ATROVENT) 0.02 % nebulizer solution Take 2.5 mLs (0.5 mg total) by nebulization 4 (four) times daily.  Marland Kitchen ipratropium-albuterol (DUONEB) 0.5-2.5 (3) MG/3ML SOLN Take 3 mLs by nebulization every 6 (six) hours as needed.  . metoprolol succinate (TOPROL-XL) 25 MG 24 hr tablet Take 1 tablet (25 mg total) by mouth daily.  . mirtazapine (REMERON) 15 MG tablet TAKE 1 TABLET(15 MG) BY MOUTH AT BEDTIME  . pantoprazole (PROTONIX) 40 MG tablet TAKE 1 TABLET(40 MG) BY MOUTH DAILY  . predniSONE (DELTASONE) 20 MG tablet Take 1 tablet (20 mg total) by mouth daily with breakfast.   No facility-administered encounter medications on file as of 10/03/2017.      Review of Systems  Constitutional:   No  weight loss, night sweats,  Fevers, chills,  +fatigue, or  lassitude.  HEENT:   No headaches,  Difficulty swallowing,  Tooth/dental problems, or  Sore throat,                No sneezing, itching, ear ache, nasal congestion, post nasal drip,   CV:  No chest pain,  Orthopnea, PND, swelling in lower extremities, anasarca, dizziness, palpitations, syncope.   GI  No heartburn, indigestion, abdominal pain, nausea, vomiting, diarrhea, change in bowel habits, loss of appetite, bloody stools.   Resp:    No chest wall deformity  Skin: no rash or lesions.  GU: no dysuria, change in color of urine, no urgency or frequency.  No flank pain, no  hematuria   MS:  No joint pain or swelling.  No decreased range of motion.  No back pain.    Physical Exam  BP 131/82 (BP Location: Right Arm, Patient Position: Sitting, Cuff Size: Normal)   Pulse 97   Ht 5\' 1"  (1.549 m)   SpO2 98%   BMI 18.52 kg/m   GEN: A/Ox3; pleasant , NAD, thin , frail on O2 in wc    HEENT:  [place]/AT,  EACs-clear, TMs-wnl, NOSE-clear, THROAT-clear, no lesions, no postnasal drip or exudate noted.   NECK:  Supple w/ fair ROM; no JVD; normal carotid impulses w/o bruits; no thyromegaly or nodules palpated; no lymphadenopathy.    RESP  Decreased BS in bases ,  no accessory muscle use, no dullness  to percussion  CARD:  RRR, no m/r/g, no peripheral edema, pulses intact, no cyanosis or clubbing.  GI:   Soft & nt; nml bowel sounds; no organomegaly or masses detected.   Musco: Warm bil, no deformities or joint swelling noted.   Neuro: alert, no focal deficits noted.    Skin: Warm, no lesions or rashes    Lab Results:  CBC  BMET  BNP  Imaging: No results found.   Assessment & Plan:   COPD mixed type ([place]) Very severe COPD - no flare on current regimen  Now in hospice care.  Cont on current regimen .   Plan  Patient Instructions  Continue on Duoneb Four times a day   Increase Prednisone 20mg  . daily  Continue on Brovana and Budesonide Neb Twice daily  .  Continue on 2 L rest and 3l/m activity .  Follow up in 3 months and As needed   Please contact office for sooner follow up if symptoms do not improve or worsen or seek emergency care       Chronic respiratory failure Cont on O2 .      Rexene Edison, NP 10/03/2017

## 2017-10-03 NOTE — Assessment & Plan Note (Signed)
Very severe COPD - no flare on current regimen  Now in hospice care.  Cont on current regimen .   Plan  Patient Instructions  Continue on Duoneb Four times a day   Increase Prednisone 20mg  . daily  Continue on Brovana and Budesonide Neb Twice daily  .  Continue on 2 L rest and 3l/m activity .  Follow up in 3 months and As needed   Please contact office for sooner follow up if symptoms do not improve or worsen or seek emergency care

## 2017-10-03 NOTE — Assessment & Plan Note (Signed)
Cont on O2 .  

## 2017-10-03 NOTE — Patient Instructions (Addendum)
Continue on Duoneb Four times a day   Increase Prednisone 20mg  . daily  Continue on Brovana and Budesonide Neb Twice daily  .  Continue on 2 L rest and 3l/m activity .  Follow up in 3 months and As needed   Please contact office for sooner follow up if symptoms do not improve or worsen or seek emergency care

## 2017-10-04 DIAGNOSIS — I10 Essential (primary) hypertension: Secondary | ICD-10-CM | POA: Diagnosis not present

## 2017-10-04 DIAGNOSIS — K219 Gastro-esophageal reflux disease without esophagitis: Secondary | ICD-10-CM | POA: Diagnosis not present

## 2017-10-04 DIAGNOSIS — J301 Allergic rhinitis due to pollen: Secondary | ICD-10-CM | POA: Diagnosis not present

## 2017-10-04 DIAGNOSIS — F339 Major depressive disorder, recurrent, unspecified: Secondary | ICD-10-CM | POA: Diagnosis not present

## 2017-10-04 DIAGNOSIS — J449 Chronic obstructive pulmonary disease, unspecified: Secondary | ICD-10-CM | POA: Diagnosis not present

## 2017-10-10 DIAGNOSIS — I10 Essential (primary) hypertension: Secondary | ICD-10-CM | POA: Diagnosis not present

## 2017-10-10 DIAGNOSIS — J449 Chronic obstructive pulmonary disease, unspecified: Secondary | ICD-10-CM | POA: Diagnosis not present

## 2017-10-10 DIAGNOSIS — J301 Allergic rhinitis due to pollen: Secondary | ICD-10-CM | POA: Diagnosis not present

## 2017-10-10 DIAGNOSIS — F339 Major depressive disorder, recurrent, unspecified: Secondary | ICD-10-CM | POA: Diagnosis not present

## 2017-10-10 DIAGNOSIS — K219 Gastro-esophageal reflux disease without esophagitis: Secondary | ICD-10-CM | POA: Diagnosis not present

## 2017-10-10 NOTE — Progress Notes (Signed)
Reviewed & agree with plan  

## 2017-10-17 DIAGNOSIS — F339 Major depressive disorder, recurrent, unspecified: Secondary | ICD-10-CM | POA: Diagnosis not present

## 2017-10-17 DIAGNOSIS — K219 Gastro-esophageal reflux disease without esophagitis: Secondary | ICD-10-CM | POA: Diagnosis not present

## 2017-10-17 DIAGNOSIS — J301 Allergic rhinitis due to pollen: Secondary | ICD-10-CM | POA: Diagnosis not present

## 2017-10-17 DIAGNOSIS — J449 Chronic obstructive pulmonary disease, unspecified: Secondary | ICD-10-CM | POA: Diagnosis not present

## 2017-10-17 DIAGNOSIS — I10 Essential (primary) hypertension: Secondary | ICD-10-CM | POA: Diagnosis not present

## 2017-10-18 ENCOUNTER — Other Ambulatory Visit: Payer: Self-pay | Admitting: Family Medicine

## 2017-10-18 DIAGNOSIS — I10 Essential (primary) hypertension: Secondary | ICD-10-CM | POA: Diagnosis not present

## 2017-10-18 DIAGNOSIS — K219 Gastro-esophageal reflux disease without esophagitis: Secondary | ICD-10-CM | POA: Diagnosis not present

## 2017-10-18 DIAGNOSIS — F339 Major depressive disorder, recurrent, unspecified: Secondary | ICD-10-CM | POA: Diagnosis not present

## 2017-10-18 DIAGNOSIS — J449 Chronic obstructive pulmonary disease, unspecified: Secondary | ICD-10-CM | POA: Diagnosis not present

## 2017-10-18 DIAGNOSIS — J301 Allergic rhinitis due to pollen: Secondary | ICD-10-CM | POA: Diagnosis not present

## 2017-10-19 DIAGNOSIS — K219 Gastro-esophageal reflux disease without esophagitis: Secondary | ICD-10-CM | POA: Diagnosis not present

## 2017-10-19 DIAGNOSIS — I10 Essential (primary) hypertension: Secondary | ICD-10-CM | POA: Diagnosis not present

## 2017-10-19 DIAGNOSIS — J449 Chronic obstructive pulmonary disease, unspecified: Secondary | ICD-10-CM | POA: Diagnosis not present

## 2017-10-19 DIAGNOSIS — F339 Major depressive disorder, recurrent, unspecified: Secondary | ICD-10-CM | POA: Diagnosis not present

## 2017-10-19 DIAGNOSIS — J301 Allergic rhinitis due to pollen: Secondary | ICD-10-CM | POA: Diagnosis not present

## 2017-10-21 DIAGNOSIS — J449 Chronic obstructive pulmonary disease, unspecified: Secondary | ICD-10-CM | POA: Diagnosis not present

## 2017-10-21 DIAGNOSIS — F339 Major depressive disorder, recurrent, unspecified: Secondary | ICD-10-CM | POA: Diagnosis not present

## 2017-10-21 DIAGNOSIS — J301 Allergic rhinitis due to pollen: Secondary | ICD-10-CM | POA: Diagnosis not present

## 2017-10-21 DIAGNOSIS — I10 Essential (primary) hypertension: Secondary | ICD-10-CM | POA: Diagnosis not present

## 2017-10-21 DIAGNOSIS — K219 Gastro-esophageal reflux disease without esophagitis: Secondary | ICD-10-CM | POA: Diagnosis not present

## 2017-10-24 DIAGNOSIS — F339 Major depressive disorder, recurrent, unspecified: Secondary | ICD-10-CM | POA: Diagnosis not present

## 2017-10-24 DIAGNOSIS — I10 Essential (primary) hypertension: Secondary | ICD-10-CM | POA: Diagnosis not present

## 2017-10-24 DIAGNOSIS — J449 Chronic obstructive pulmonary disease, unspecified: Secondary | ICD-10-CM | POA: Diagnosis not present

## 2017-10-24 DIAGNOSIS — K219 Gastro-esophageal reflux disease without esophagitis: Secondary | ICD-10-CM | POA: Diagnosis not present

## 2017-10-24 DIAGNOSIS — J301 Allergic rhinitis due to pollen: Secondary | ICD-10-CM | POA: Diagnosis not present

## 2017-10-25 DIAGNOSIS — F339 Major depressive disorder, recurrent, unspecified: Secondary | ICD-10-CM | POA: Diagnosis not present

## 2017-10-25 DIAGNOSIS — I10 Essential (primary) hypertension: Secondary | ICD-10-CM | POA: Diagnosis not present

## 2017-10-25 DIAGNOSIS — J301 Allergic rhinitis due to pollen: Secondary | ICD-10-CM | POA: Diagnosis not present

## 2017-10-25 DIAGNOSIS — K219 Gastro-esophageal reflux disease without esophagitis: Secondary | ICD-10-CM | POA: Diagnosis not present

## 2017-10-25 DIAGNOSIS — J449 Chronic obstructive pulmonary disease, unspecified: Secondary | ICD-10-CM | POA: Diagnosis not present

## 2017-10-29 DIAGNOSIS — J449 Chronic obstructive pulmonary disease, unspecified: Secondary | ICD-10-CM | POA: Diagnosis not present

## 2017-10-29 DIAGNOSIS — K219 Gastro-esophageal reflux disease without esophagitis: Secondary | ICD-10-CM | POA: Diagnosis not present

## 2017-10-29 DIAGNOSIS — J301 Allergic rhinitis due to pollen: Secondary | ICD-10-CM | POA: Diagnosis not present

## 2017-10-29 DIAGNOSIS — I10 Essential (primary) hypertension: Secondary | ICD-10-CM | POA: Diagnosis not present

## 2017-10-29 DIAGNOSIS — F339 Major depressive disorder, recurrent, unspecified: Secondary | ICD-10-CM | POA: Diagnosis not present

## 2017-10-31 DIAGNOSIS — K219 Gastro-esophageal reflux disease without esophagitis: Secondary | ICD-10-CM | POA: Diagnosis not present

## 2017-10-31 DIAGNOSIS — J449 Chronic obstructive pulmonary disease, unspecified: Secondary | ICD-10-CM | POA: Diagnosis not present

## 2017-10-31 DIAGNOSIS — I10 Essential (primary) hypertension: Secondary | ICD-10-CM | POA: Diagnosis not present

## 2017-10-31 DIAGNOSIS — F339 Major depressive disorder, recurrent, unspecified: Secondary | ICD-10-CM | POA: Diagnosis not present

## 2017-10-31 DIAGNOSIS — J301 Allergic rhinitis due to pollen: Secondary | ICD-10-CM | POA: Diagnosis not present

## 2017-11-07 DIAGNOSIS — K219 Gastro-esophageal reflux disease without esophagitis: Secondary | ICD-10-CM | POA: Diagnosis not present

## 2017-11-07 DIAGNOSIS — J301 Allergic rhinitis due to pollen: Secondary | ICD-10-CM | POA: Diagnosis not present

## 2017-11-07 DIAGNOSIS — J449 Chronic obstructive pulmonary disease, unspecified: Secondary | ICD-10-CM | POA: Diagnosis not present

## 2017-11-07 DIAGNOSIS — I10 Essential (primary) hypertension: Secondary | ICD-10-CM | POA: Diagnosis not present

## 2017-11-07 DIAGNOSIS — F339 Major depressive disorder, recurrent, unspecified: Secondary | ICD-10-CM | POA: Diagnosis not present

## 2017-11-08 ENCOUNTER — Other Ambulatory Visit: Payer: Self-pay | Admitting: Family Medicine

## 2017-11-08 DIAGNOSIS — J449 Chronic obstructive pulmonary disease, unspecified: Secondary | ICD-10-CM | POA: Diagnosis not present

## 2017-11-08 DIAGNOSIS — K219 Gastro-esophageal reflux disease without esophagitis: Secondary | ICD-10-CM | POA: Diagnosis not present

## 2017-11-08 DIAGNOSIS — J301 Allergic rhinitis due to pollen: Secondary | ICD-10-CM | POA: Diagnosis not present

## 2017-11-08 DIAGNOSIS — I10 Essential (primary) hypertension: Secondary | ICD-10-CM | POA: Diagnosis not present

## 2017-11-08 DIAGNOSIS — F339 Major depressive disorder, recurrent, unspecified: Secondary | ICD-10-CM | POA: Diagnosis not present

## 2017-11-14 ENCOUNTER — Ambulatory Visit: Payer: Medicare PPO | Admitting: Adult Health

## 2017-11-14 ENCOUNTER — Encounter: Payer: Self-pay | Admitting: Adult Health

## 2017-11-14 ENCOUNTER — Ambulatory Visit (INDEPENDENT_AMBULATORY_CARE_PROVIDER_SITE_OTHER): Admitting: Adult Health

## 2017-11-14 DIAGNOSIS — J301 Allergic rhinitis due to pollen: Secondary | ICD-10-CM | POA: Diagnosis not present

## 2017-11-14 DIAGNOSIS — F339 Major depressive disorder, recurrent, unspecified: Secondary | ICD-10-CM | POA: Diagnosis not present

## 2017-11-14 DIAGNOSIS — F418 Other specified anxiety disorders: Secondary | ICD-10-CM

## 2017-11-14 DIAGNOSIS — J9611 Chronic respiratory failure with hypoxia: Secondary | ICD-10-CM | POA: Diagnosis not present

## 2017-11-14 DIAGNOSIS — J449 Chronic obstructive pulmonary disease, unspecified: Secondary | ICD-10-CM

## 2017-11-14 DIAGNOSIS — K219 Gastro-esophageal reflux disease without esophagitis: Secondary | ICD-10-CM | POA: Diagnosis not present

## 2017-11-14 DIAGNOSIS — I10 Essential (primary) hypertension: Secondary | ICD-10-CM | POA: Diagnosis not present

## 2017-11-14 NOTE — Assessment & Plan Note (Signed)
Appears to be stable.  Anxiety is quite significant for her in this stage of COPD.  Stress reducers encouraged patient is to continue on her current regimen

## 2017-11-14 NOTE — Assessment & Plan Note (Signed)
Very severe COPD currently compensated without exacerbation.  Patient remains under hospice care and appears to be stable.  She has not had a flare of symptoms with the discontinuation of Pulmicort and Brovana.  Continue on her current regimen with DuoNeb 4 times a day with option to have extra treatments along with prednisone 20 mg daily. She is to follow-up in 4 weeks per her request and as needed

## 2017-11-14 NOTE — Assessment & Plan Note (Signed)
Oxygen demands appear to be stable she is to continue on 2 L at rest and 3 L walking.

## 2017-11-14 NOTE — Patient Instructions (Addendum)
Continue on Duoneb Four times a day  And extra if needed.  Continue Prednisone 20mg  . daily  Continue on 2 L rest and 3l/m activity .  Follow up in 4 weeks with Parrett NP in [place] . And As needed   Please contact office for sooner follow up if symptoms do not improve or worsen or seek emergency care

## 2017-11-14 NOTE — Progress Notes (Signed)
@Patient  ID: [name], female    DOB: Apr 21, 1941, 77 y.o.   MRN: [id number]  Chief Complaint  Patient presents with  . Follow-up    COPD     Referring provider: Mosie Lukes, MD  HPI: 63 yowf , heavy ex- smoker, quit 2002 with GOLD D COPD FEV1 26% 6/10 on 24 h O2 since 2009.  -completed pulm rehab oct '11  Recurrent flares 2-3/yr requiring steroid bursts  She uses 3 liters 24/7. On a regimen of budesonide/brovana &alb/atrovent nebs   Significant tests/ events Admitted 12/2012 for COPD exacerbation .ABG 7.39/55/75 on 3L [place]  She required low dose prednisone since her admit in 02/2013   Had colonoscopy with polyp removal 06/2013  08/2013 BL pneumonia  03/2014 ONO on 2L >>no sig desatn  CT chest 10/2014 neg PE  12/06/2014 >>2.5 L at rest , 3L on exertion - ONO ok in the past [facility]   Hospice referral 07/2017   11/14/2017 Follow up : COPD /GOLD IV , O2 RF  Patient returns for a one-month follow-up.  She says overall her breathing is doing about the same.  She is now under hospice care at home.  She gets very short of breath with minimal activity.  She has had no increased flare of cough shortness of breath or increased oxygen needs.  Hospice has recently changed her nebulizer regimen to DuoNeb 4 times daily with the option of extra treatments throughout the day.  They have held Pulmicort and Brovana nebs.  She remains steroid dependent with prednisone 20 mg daily.  She says she has not seen any increased symptoms since stopping Pulmicort and Brovana nebulizers.  She remains on oxygen 2 L at rest and 3 L with activity.  O2 saturations have remained above 90%.  She does feel that hospice is benefiting her and really likes that people are checking on her.  She denies any hemoptysis chest pain orthopnea PND or leg swelling.  Appetite has been increased and her weight is actually up a couple pounds now over 100 pounds.  She does have some  healthcare power of attorney papers and advanced directive paperwork that was reviewed and copy for our chart.    Allergies  Allergen Reactions  . Morphine Other (See Comments)    REACTION: nightmares  . Ambien [Zolpidem] Anxiety  . Cymbalta [Duloxetine Hcl] Anxiety  . Nitrofurantoin Rash    Immunization History  Administered Date(s) Administered  . H1N1 11/02/2008  . Influenza Split 07/31/2011  . Influenza Whole 09/22/2007, 08/18/2009, 08/02/2010  . Influenza, High Dose Seasonal PF 06/26/2016, 08/08/2017  . Influenza,inj,Quad PF,6+ Mos 07/14/2013, 08/05/2014, 07/14/2015  . Influenza-Unspecified 09/20/2012  . Pneumococcal Conjugate-13 08/10/2014  . Pneumococcal Polysaccharide-23 11/30/2003, 08/02/2010  . Td 12/14/2002  . Tdap 08/31/2013  . Zoster 01/26/2011    Past Medical History:  Diagnosis Date  . Anxiety   . Arthralgia   . Atrophic vaginitis 12/07/2012  . Chest pain   . Clostridium difficile diarrhea 12/27/2014  . COPD (chronic obstructive pulmonary disease) ([place])   . Depression   . GERD (gastroesophageal reflux disease)   . Glucose intolerance (impaired glucose tolerance)   . History of small bowel obstruction   . Hx of adenomatous colonic polyps   . Hyperglycemia 12/01/2013  . Hyperlipidemia 03/19/2017  . Hypertension   . Ischemic colitis ([place])   . Medicare annual wellness visit, subsequent 01/12/2016  . Migraine   . Nocturia 09/02/2013  . Osteoporosis   .  Thyroid disease   . Weight loss     Tobacco History: Social History   Tobacco Use  Smoking Status Former Smoker  . Packs/day: 2.00  . Years: 40.00  . Pack years: 80.00  . Types: Cigarettes  . Last attempt to quit: 10/29/2001  . Years since quitting: 16.0  Smokeless Tobacco Never Used   Counseling given: Not Answered   Outpatient Encounter Medications as of 11/14/2017  Medication Sig  . albuterol (PROVENTIL HFA;VENTOLIN HFA) 108 (90 BASE) MCG/ACT inhaler Inhale 2 puffs into the lungs every 6 (six)  hours as needed for wheezing or shortness of breath.  . ALPRAZolam (XANAX) 0.5 MG tablet Take 1 tablet (0.5 mg total) by mouth 2 (two) times daily as needed. for anxiety  . arformoterol (BROVANA) 15 MCG/2ML NEBU Take 2 mLs (15 mcg total) by nebulization 2 (two) times daily. Dx J44.9  . budesonide (PULMICORT) 0.25 MG/2ML nebulizer solution Take 2 mLs (0.25 mg total) by nebulization 2 (two) times daily. Dx: J44.9  . Calcium Carbonate-Vitamin D 600-400 MG-UNIT per tablet Take 1 tablet by mouth 2 (two) times daily.    . citalopram (CELEXA) 20 MG tablet TAKE 1 TABLET(20 MG) BY MOUTH DAILY  . citalopram (CELEXA) 40 MG tablet Take 1 tablet (40 mg total) by mouth daily. (Patient taking differently: Take 20 mg by mouth daily. )  . Ferrous Fumarate-Folic Acid (HEMOCYTE-F) 324-1 MG TABS Take 1 tablet by mouth daily.  . fluticasone (FLONASE) 50 MCG/ACT nasal spray Place 2 sprays into both nostrils 2 (two) times daily as needed.   . Glucosamine-Chondroitin (GLUCOSAMINE CHONDR COMPLEX PO) Take 1 tablet by mouth 2 (two) times daily.   Marland Kitchen ipratropium (ATROVENT) 0.02 % nebulizer solution Take 2.5 mLs (0.5 mg total) by nebulization 4 (four) times daily.  Marland Kitchen ipratropium-albuterol (DUONEB) 0.5-2.5 (3) MG/3ML SOLN Take 3 mLs by nebulization every 6 (six) hours as needed.  . metoprolol succinate (TOPROL-XL) 25 MG 24 hr tablet Take 1 tablet (25 mg total) by mouth daily.  . mirtazapine (REMERON) 15 MG tablet Take 1 tablet (15 mg total) by mouth at bedtime.  . pantoprazole (PROTONIX) 40 MG tablet TAKE 1 TABLET(40 MG) BY MOUTH DAILY  . predniSONE (DELTASONE) 20 MG tablet Take 1 tablet (20 mg total) by mouth daily with breakfast.   No facility-administered encounter medications on file as of 11/14/2017.      Review of Systems  Constitutional:   No  weight loss, night sweats,  Fevers, chills,  +fatigue, or  lassitude.  HEENT:   No headaches,  Difficulty swallowing,  Tooth/dental problems, or  Sore throat,                 No sneezing, itching, ear ache, nasal congestion, post nasal drip,   CV:  No chest pain,  Orthopnea, PND, swelling in lower extremities, anasarca, dizziness, palpitations, syncope.   GI  No heartburn, indigestion, abdominal pain, nausea, vomiting, diarrhea, change in bowel habits, loss of appetite, bloody stools.   Resp:  .  No chest wall deformity  Skin: no rash or lesions.  GU: no dysuria, change in color of urine, no urgency or frequency.  No flank pain, no hematuria   MS:  No joint pain or swelling.  No decreased range of motion.  No back pain.    Physical Exam  BP 132/82 (BP Location: Left Arm, Cuff Size: Normal)   Pulse 86   Ht 5\' 1"  (1.549 m)   Wt 101 lb (45.8 kg)   SpO2  98%   BMI 19.08 kg/m   GEN: A/Ox3; pleasant , NAD, frail elderly on oxygen   HEENT:  [place]/AT,  EACs-clear, TMs-wnl, NOSE-clear, THROAT-clear, no lesions, no postnasal drip or exudate noted.   NECK:  Supple w/ fair ROM; no JVD; normal carotid impulses w/o bruits; no thyromegaly or nodules palpated; no lymphadenopathy.    RESP diminished breath sounds in the bases  no accessory muscle use, no dullness to percussion  CARD:  RRR, no m/r/g, no peripheral edema, pulses intact, no cyanosis or clubbing.  GI:   Soft & nt; nml bowel sounds; no organomegaly or masses detected.   Musco: Warm bil, no deformities or joint swelling noted.   Neuro: alert, no focal deficits noted.    Skin: Warm, no lesions or rashes    Lab Results:  CBC   BNP  Imaging: No results found.   Assessment & Plan:   COPD mixed type ([place]) Very severe COPD currently compensated without exacerbation.  Patient remains under hospice care and appears to be stable.  She has not had a flare of symptoms with the discontinuation of Pulmicort and Brovana.  Continue on her current regimen with DuoNeb 4 times a day with option to have extra treatments along with prednisone 20 mg daily. She is to follow-up in 4 weeks per her request and  as needed  Chronic respiratory failure Oxygen demands appear to be stable she is to continue on 2 L at rest and 3 L walking.  Depression with anxiety Appears to be stable.  Anxiety is quite significant for her in this stage of COPD.  Stress reducers encouraged patient is to continue on her current regimen     Rexene Edison, NP 11/14/2017

## 2017-11-15 DIAGNOSIS — F339 Major depressive disorder, recurrent, unspecified: Secondary | ICD-10-CM | POA: Diagnosis not present

## 2017-11-15 DIAGNOSIS — I10 Essential (primary) hypertension: Secondary | ICD-10-CM | POA: Diagnosis not present

## 2017-11-15 DIAGNOSIS — J449 Chronic obstructive pulmonary disease, unspecified: Secondary | ICD-10-CM | POA: Diagnosis not present

## 2017-11-15 DIAGNOSIS — J301 Allergic rhinitis due to pollen: Secondary | ICD-10-CM | POA: Diagnosis not present

## 2017-11-15 DIAGNOSIS — K219 Gastro-esophageal reflux disease without esophagitis: Secondary | ICD-10-CM | POA: Diagnosis not present

## 2017-11-22 DIAGNOSIS — J449 Chronic obstructive pulmonary disease, unspecified: Secondary | ICD-10-CM | POA: Diagnosis not present

## 2017-11-22 DIAGNOSIS — K219 Gastro-esophageal reflux disease without esophagitis: Secondary | ICD-10-CM | POA: Diagnosis not present

## 2017-11-22 DIAGNOSIS — J301 Allergic rhinitis due to pollen: Secondary | ICD-10-CM | POA: Diagnosis not present

## 2017-11-22 DIAGNOSIS — F339 Major depressive disorder, recurrent, unspecified: Secondary | ICD-10-CM | POA: Diagnosis not present

## 2017-11-22 DIAGNOSIS — I10 Essential (primary) hypertension: Secondary | ICD-10-CM | POA: Diagnosis not present

## 2017-11-29 DIAGNOSIS — F339 Major depressive disorder, recurrent, unspecified: Secondary | ICD-10-CM | POA: Diagnosis not present

## 2017-11-29 DIAGNOSIS — I10 Essential (primary) hypertension: Secondary | ICD-10-CM | POA: Diagnosis not present

## 2017-11-29 DIAGNOSIS — J449 Chronic obstructive pulmonary disease, unspecified: Secondary | ICD-10-CM | POA: Diagnosis not present

## 2017-11-29 DIAGNOSIS — K219 Gastro-esophageal reflux disease without esophagitis: Secondary | ICD-10-CM | POA: Diagnosis not present

## 2017-11-29 DIAGNOSIS — J301 Allergic rhinitis due to pollen: Secondary | ICD-10-CM | POA: Diagnosis not present

## 2017-12-05 DIAGNOSIS — J449 Chronic obstructive pulmonary disease, unspecified: Secondary | ICD-10-CM | POA: Diagnosis not present

## 2017-12-05 DIAGNOSIS — J301 Allergic rhinitis due to pollen: Secondary | ICD-10-CM | POA: Diagnosis not present

## 2017-12-05 DIAGNOSIS — I10 Essential (primary) hypertension: Secondary | ICD-10-CM | POA: Diagnosis not present

## 2017-12-05 DIAGNOSIS — K219 Gastro-esophageal reflux disease without esophagitis: Secondary | ICD-10-CM | POA: Diagnosis not present

## 2017-12-05 DIAGNOSIS — F339 Major depressive disorder, recurrent, unspecified: Secondary | ICD-10-CM | POA: Diagnosis not present

## 2017-12-12 ENCOUNTER — Ambulatory Visit: Admitting: Adult Health

## 2017-12-13 ENCOUNTER — Other Ambulatory Visit: Payer: Self-pay | Admitting: Family Medicine

## 2017-12-13 DIAGNOSIS — K219 Gastro-esophageal reflux disease without esophagitis: Secondary | ICD-10-CM | POA: Diagnosis not present

## 2017-12-13 DIAGNOSIS — J301 Allergic rhinitis due to pollen: Secondary | ICD-10-CM | POA: Diagnosis not present

## 2017-12-13 DIAGNOSIS — F339 Major depressive disorder, recurrent, unspecified: Secondary | ICD-10-CM | POA: Diagnosis not present

## 2017-12-13 DIAGNOSIS — I10 Essential (primary) hypertension: Secondary | ICD-10-CM | POA: Diagnosis not present

## 2017-12-13 DIAGNOSIS — J449 Chronic obstructive pulmonary disease, unspecified: Secondary | ICD-10-CM | POA: Diagnosis not present

## 2017-12-20 ENCOUNTER — Telehealth: Payer: Self-pay | Admitting: Family Medicine

## 2017-12-20 ENCOUNTER — Telehealth: Payer: Self-pay | Admitting: Pulmonary Disease

## 2017-12-20 DIAGNOSIS — K219 Gastro-esophageal reflux disease without esophagitis: Secondary | ICD-10-CM | POA: Diagnosis not present

## 2017-12-20 DIAGNOSIS — I10 Essential (primary) hypertension: Secondary | ICD-10-CM | POA: Diagnosis not present

## 2017-12-20 DIAGNOSIS — J301 Allergic rhinitis due to pollen: Secondary | ICD-10-CM | POA: Diagnosis not present

## 2017-12-20 DIAGNOSIS — J449 Chronic obstructive pulmonary disease, unspecified: Secondary | ICD-10-CM | POA: Diagnosis not present

## 2017-12-20 DIAGNOSIS — F339 Major depressive disorder, recurrent, unspecified: Secondary | ICD-10-CM | POA: Diagnosis not present

## 2017-12-20 NOTE — Telephone Encounter (Signed)
Patient called to discuss her symptoms relating to needing omeprazole refilled, she says "I'm having problems with my acid reflux and I need to get if filled." I informed her that the bottle had 2017 on it and back at her last visit with Dr. Charlett Blake it mentioned Zantac. She said "I did take it and must have run out." I offered to make her an appointment to see Dr. Charlett Blake for her acid reflux, she said "it is so hard for me to get dressed and get out the house. Let me have my husband go buy some Zantac from the store and take that. I will call back on Monday if it doesn't help and make an appointment." I also advised she can buy Omeprazole OTC as well, she verbalized understanding.

## 2017-12-20 NOTE — Telephone Encounter (Signed)
TP is going to call patient Routing to TP

## 2017-12-20 NOTE — Telephone Encounter (Signed)
Returned call to [place] [facility] about the refill request for omeprazole-sodium bicarbonate (ZEGERID) 40-1100 MG capsule. I informed her this is not listed on the patient's medication profile. She said "she told me she needed the omeprazole filled for her indigestion. The bottle shows a date of 2017 by Dr. Charlett Blake." I advised that I will call the patient to discuss her symptoms and go from there, she verbalized understanding.

## 2017-12-20 NOTE — Telephone Encounter (Signed)
Spoke with [place] and she states pt reported she fell on her tailbone the other day. There is no swelling or bruising but she does complain of pain. She is having left thoracic pain but she is taking Prednisone 20 mg daily. She also has a sore throat and right ear pain. She does not have any transportation because her husband is unable to drive her to the urgent care. Tonya wanted to call and relay that information.  I called and she confirmed the symptoms that Tonya prescribed. She states she is supposed to call Hospice for everything when I mentioned she go to an urgent care, but states Tammy knows her really well and wanted me to send the message to her. TP please advise.

## 2017-12-20 NOTE — Telephone Encounter (Signed)
Spoke with pt, she states she is allergic to Morphine and she does not have anything for pain but Tylenol.   She states she has a sore throat and ear pain that started yesterday. She denies fever and chills. There is a sharp pain that comes and goes in her ear. It is bearable but very annoying. She suggested she can make it through the weekend with taking Tylenol. ;

## 2017-12-20 NOTE — Telephone Encounter (Signed)
Use tylenol and warm heat  Advised if breathing is doing worse with pt will need to go to ER  Please contact office for sooner follow up if symptoms do not improve or worsen or seek emergency care

## 2017-12-20 NOTE — Telephone Encounter (Signed)
Copied from [place] [id number]. Topic: Quick Communication - Rx Refill/Question >> Dec 20, 2017 12:18 PM [name] wrote: Medication: omeprazole-sodium bicarbonate (ZEGERID) 40-1100 MG capsule [[id number]] DISCONTINUED  Preferred Pharmacy (with phone number or street name): [facility]  [name] from [facility] called to request meds, contact [phone]

## 2017-12-20 NOTE — Telephone Encounter (Signed)
Per TP: please call and see what it is that they are asking for.  Doesn't she have Morphine on file for pain?  LMOM TCB x1 Tonya  Can triage call again after 5pm so that we can try to get patient taken care of before the weekend?  Thanks!

## 2017-12-24 DIAGNOSIS — J301 Allergic rhinitis due to pollen: Secondary | ICD-10-CM | POA: Diagnosis not present

## 2017-12-24 DIAGNOSIS — I10 Essential (primary) hypertension: Secondary | ICD-10-CM | POA: Diagnosis not present

## 2017-12-24 DIAGNOSIS — K219 Gastro-esophageal reflux disease without esophagitis: Secondary | ICD-10-CM | POA: Diagnosis not present

## 2017-12-24 DIAGNOSIS — J449 Chronic obstructive pulmonary disease, unspecified: Secondary | ICD-10-CM | POA: Diagnosis not present

## 2017-12-24 DIAGNOSIS — F339 Major depressive disorder, recurrent, unspecified: Secondary | ICD-10-CM | POA: Diagnosis not present

## 2017-12-27 DIAGNOSIS — J449 Chronic obstructive pulmonary disease, unspecified: Secondary | ICD-10-CM | POA: Diagnosis not present

## 2017-12-27 DIAGNOSIS — F339 Major depressive disorder, recurrent, unspecified: Secondary | ICD-10-CM | POA: Diagnosis not present

## 2017-12-27 DIAGNOSIS — K219 Gastro-esophageal reflux disease without esophagitis: Secondary | ICD-10-CM | POA: Diagnosis not present

## 2017-12-27 DIAGNOSIS — J301 Allergic rhinitis due to pollen: Secondary | ICD-10-CM | POA: Diagnosis not present

## 2017-12-27 DIAGNOSIS — I10 Essential (primary) hypertension: Secondary | ICD-10-CM | POA: Diagnosis not present

## 2018-01-03 DIAGNOSIS — J301 Allergic rhinitis due to pollen: Secondary | ICD-10-CM | POA: Diagnosis not present

## 2018-01-03 DIAGNOSIS — I10 Essential (primary) hypertension: Secondary | ICD-10-CM | POA: Diagnosis not present

## 2018-01-03 DIAGNOSIS — J449 Chronic obstructive pulmonary disease, unspecified: Secondary | ICD-10-CM | POA: Diagnosis not present

## 2018-01-03 DIAGNOSIS — K219 Gastro-esophageal reflux disease without esophagitis: Secondary | ICD-10-CM | POA: Diagnosis not present

## 2018-01-03 DIAGNOSIS — F339 Major depressive disorder, recurrent, unspecified: Secondary | ICD-10-CM | POA: Diagnosis not present

## 2018-01-10 DIAGNOSIS — J449 Chronic obstructive pulmonary disease, unspecified: Secondary | ICD-10-CM | POA: Diagnosis not present

## 2018-01-10 DIAGNOSIS — F339 Major depressive disorder, recurrent, unspecified: Secondary | ICD-10-CM | POA: Diagnosis not present

## 2018-01-10 DIAGNOSIS — I10 Essential (primary) hypertension: Secondary | ICD-10-CM | POA: Diagnosis not present

## 2018-01-10 DIAGNOSIS — J301 Allergic rhinitis due to pollen: Secondary | ICD-10-CM | POA: Diagnosis not present

## 2018-01-10 DIAGNOSIS — K219 Gastro-esophageal reflux disease without esophagitis: Secondary | ICD-10-CM | POA: Diagnosis not present

## 2018-01-17 DIAGNOSIS — J449 Chronic obstructive pulmonary disease, unspecified: Secondary | ICD-10-CM | POA: Diagnosis not present

## 2018-01-17 DIAGNOSIS — F339 Major depressive disorder, recurrent, unspecified: Secondary | ICD-10-CM | POA: Diagnosis not present

## 2018-01-17 DIAGNOSIS — J301 Allergic rhinitis due to pollen: Secondary | ICD-10-CM | POA: Diagnosis not present

## 2018-01-17 DIAGNOSIS — K219 Gastro-esophageal reflux disease without esophagitis: Secondary | ICD-10-CM | POA: Diagnosis not present

## 2018-01-17 DIAGNOSIS — I10 Essential (primary) hypertension: Secondary | ICD-10-CM | POA: Diagnosis not present

## 2018-01-24 DIAGNOSIS — J449 Chronic obstructive pulmonary disease, unspecified: Secondary | ICD-10-CM | POA: Diagnosis not present

## 2018-01-24 DIAGNOSIS — F339 Major depressive disorder, recurrent, unspecified: Secondary | ICD-10-CM | POA: Diagnosis not present

## 2018-01-24 DIAGNOSIS — K219 Gastro-esophageal reflux disease without esophagitis: Secondary | ICD-10-CM | POA: Diagnosis not present

## 2018-01-24 DIAGNOSIS — I10 Essential (primary) hypertension: Secondary | ICD-10-CM | POA: Diagnosis not present

## 2018-01-24 DIAGNOSIS — J301 Allergic rhinitis due to pollen: Secondary | ICD-10-CM | POA: Diagnosis not present

## 2018-01-27 DIAGNOSIS — I10 Essential (primary) hypertension: Secondary | ICD-10-CM | POA: Diagnosis not present

## 2018-01-27 DIAGNOSIS — J449 Chronic obstructive pulmonary disease, unspecified: Secondary | ICD-10-CM | POA: Diagnosis not present

## 2018-01-27 DIAGNOSIS — J301 Allergic rhinitis due to pollen: Secondary | ICD-10-CM | POA: Diagnosis not present

## 2018-01-27 DIAGNOSIS — F339 Major depressive disorder, recurrent, unspecified: Secondary | ICD-10-CM | POA: Diagnosis not present

## 2018-01-27 DIAGNOSIS — K219 Gastro-esophageal reflux disease without esophagitis: Secondary | ICD-10-CM | POA: Diagnosis not present

## 2018-01-28 ENCOUNTER — Telehealth: Payer: Self-pay | Admitting: Pulmonary Disease

## 2018-01-28 NOTE — Progress Notes (Signed)
Reviewed & agree with plan  

## 2018-01-28 NOTE — Telephone Encounter (Signed)
Called and spoke with [place] from [facility]. She states that patient is recertifing for hospice. She is needing a verbal order for the patient to stay under hospice care.   RA are you ok with this, thanks.

## 2018-01-28 NOTE — Telephone Encounter (Signed)
Called and spoke with [facility]. Advised her of RA response.

## 2018-01-28 NOTE — Telephone Encounter (Signed)
Okay 

## 2018-01-29 DIAGNOSIS — K219 Gastro-esophageal reflux disease without esophagitis: Secondary | ICD-10-CM | POA: Diagnosis not present

## 2018-01-29 DIAGNOSIS — J449 Chronic obstructive pulmonary disease, unspecified: Secondary | ICD-10-CM | POA: Diagnosis not present

## 2018-01-29 DIAGNOSIS — F339 Major depressive disorder, recurrent, unspecified: Secondary | ICD-10-CM | POA: Diagnosis not present

## 2018-01-29 DIAGNOSIS — I10 Essential (primary) hypertension: Secondary | ICD-10-CM | POA: Diagnosis not present

## 2018-01-29 DIAGNOSIS — J301 Allergic rhinitis due to pollen: Secondary | ICD-10-CM | POA: Diagnosis not present

## 2018-01-31 DIAGNOSIS — J449 Chronic obstructive pulmonary disease, unspecified: Secondary | ICD-10-CM | POA: Diagnosis not present

## 2018-01-31 DIAGNOSIS — F339 Major depressive disorder, recurrent, unspecified: Secondary | ICD-10-CM | POA: Diagnosis not present

## 2018-01-31 DIAGNOSIS — J301 Allergic rhinitis due to pollen: Secondary | ICD-10-CM | POA: Diagnosis not present

## 2018-01-31 DIAGNOSIS — K219 Gastro-esophageal reflux disease without esophagitis: Secondary | ICD-10-CM | POA: Diagnosis not present

## 2018-01-31 DIAGNOSIS — I10 Essential (primary) hypertension: Secondary | ICD-10-CM | POA: Diagnosis not present

## 2018-02-03 DIAGNOSIS — J301 Allergic rhinitis due to pollen: Secondary | ICD-10-CM | POA: Diagnosis not present

## 2018-02-03 DIAGNOSIS — F339 Major depressive disorder, recurrent, unspecified: Secondary | ICD-10-CM | POA: Diagnosis not present

## 2018-02-03 DIAGNOSIS — I10 Essential (primary) hypertension: Secondary | ICD-10-CM | POA: Diagnosis not present

## 2018-02-03 DIAGNOSIS — K219 Gastro-esophageal reflux disease without esophagitis: Secondary | ICD-10-CM | POA: Diagnosis not present

## 2018-02-03 DIAGNOSIS — J449 Chronic obstructive pulmonary disease, unspecified: Secondary | ICD-10-CM | POA: Diagnosis not present

## 2018-02-06 DIAGNOSIS — I10 Essential (primary) hypertension: Secondary | ICD-10-CM | POA: Diagnosis not present

## 2018-02-06 DIAGNOSIS — J301 Allergic rhinitis due to pollen: Secondary | ICD-10-CM | POA: Diagnosis not present

## 2018-02-06 DIAGNOSIS — K219 Gastro-esophageal reflux disease without esophagitis: Secondary | ICD-10-CM | POA: Diagnosis not present

## 2018-02-06 DIAGNOSIS — F339 Major depressive disorder, recurrent, unspecified: Secondary | ICD-10-CM | POA: Diagnosis not present

## 2018-02-06 DIAGNOSIS — J449 Chronic obstructive pulmonary disease, unspecified: Secondary | ICD-10-CM | POA: Diagnosis not present

## 2018-02-07 DIAGNOSIS — J449 Chronic obstructive pulmonary disease, unspecified: Secondary | ICD-10-CM | POA: Diagnosis not present

## 2018-02-07 DIAGNOSIS — I10 Essential (primary) hypertension: Secondary | ICD-10-CM | POA: Diagnosis not present

## 2018-02-07 DIAGNOSIS — F339 Major depressive disorder, recurrent, unspecified: Secondary | ICD-10-CM | POA: Diagnosis not present

## 2018-02-07 DIAGNOSIS — K219 Gastro-esophageal reflux disease without esophagitis: Secondary | ICD-10-CM | POA: Diagnosis not present

## 2018-02-07 DIAGNOSIS — J301 Allergic rhinitis due to pollen: Secondary | ICD-10-CM | POA: Diagnosis not present

## 2018-02-10 DIAGNOSIS — J449 Chronic obstructive pulmonary disease, unspecified: Secondary | ICD-10-CM | POA: Diagnosis not present

## 2018-02-10 DIAGNOSIS — I10 Essential (primary) hypertension: Secondary | ICD-10-CM | POA: Diagnosis not present

## 2018-02-10 DIAGNOSIS — K219 Gastro-esophageal reflux disease without esophagitis: Secondary | ICD-10-CM | POA: Diagnosis not present

## 2018-02-10 DIAGNOSIS — J301 Allergic rhinitis due to pollen: Secondary | ICD-10-CM | POA: Diagnosis not present

## 2018-02-10 DIAGNOSIS — F339 Major depressive disorder, recurrent, unspecified: Secondary | ICD-10-CM | POA: Diagnosis not present

## 2018-02-13 DIAGNOSIS — K219 Gastro-esophageal reflux disease without esophagitis: Secondary | ICD-10-CM | POA: Diagnosis not present

## 2018-02-13 DIAGNOSIS — J301 Allergic rhinitis due to pollen: Secondary | ICD-10-CM | POA: Diagnosis not present

## 2018-02-13 DIAGNOSIS — F339 Major depressive disorder, recurrent, unspecified: Secondary | ICD-10-CM | POA: Diagnosis not present

## 2018-02-13 DIAGNOSIS — I10 Essential (primary) hypertension: Secondary | ICD-10-CM | POA: Diagnosis not present

## 2018-02-13 DIAGNOSIS — J449 Chronic obstructive pulmonary disease, unspecified: Secondary | ICD-10-CM | POA: Diagnosis not present

## 2018-02-14 ENCOUNTER — Other Ambulatory Visit: Payer: Self-pay | Admitting: Family Medicine

## 2018-02-20 DIAGNOSIS — J449 Chronic obstructive pulmonary disease, unspecified: Secondary | ICD-10-CM | POA: Diagnosis not present

## 2018-02-20 DIAGNOSIS — I10 Essential (primary) hypertension: Secondary | ICD-10-CM | POA: Diagnosis not present

## 2018-02-20 DIAGNOSIS — F339 Major depressive disorder, recurrent, unspecified: Secondary | ICD-10-CM | POA: Diagnosis not present

## 2018-02-20 DIAGNOSIS — J301 Allergic rhinitis due to pollen: Secondary | ICD-10-CM | POA: Diagnosis not present

## 2018-02-20 DIAGNOSIS — K219 Gastro-esophageal reflux disease without esophagitis: Secondary | ICD-10-CM | POA: Diagnosis not present

## 2018-02-21 DIAGNOSIS — K219 Gastro-esophageal reflux disease without esophagitis: Secondary | ICD-10-CM | POA: Diagnosis not present

## 2018-02-21 DIAGNOSIS — J449 Chronic obstructive pulmonary disease, unspecified: Secondary | ICD-10-CM | POA: Diagnosis not present

## 2018-02-21 DIAGNOSIS — J301 Allergic rhinitis due to pollen: Secondary | ICD-10-CM | POA: Diagnosis not present

## 2018-02-21 DIAGNOSIS — I10 Essential (primary) hypertension: Secondary | ICD-10-CM | POA: Diagnosis not present

## 2018-02-21 DIAGNOSIS — F339 Major depressive disorder, recurrent, unspecified: Secondary | ICD-10-CM | POA: Diagnosis not present

## 2018-02-24 DIAGNOSIS — F339 Major depressive disorder, recurrent, unspecified: Secondary | ICD-10-CM | POA: Diagnosis not present

## 2018-02-24 DIAGNOSIS — I10 Essential (primary) hypertension: Secondary | ICD-10-CM | POA: Diagnosis not present

## 2018-02-24 DIAGNOSIS — J301 Allergic rhinitis due to pollen: Secondary | ICD-10-CM | POA: Diagnosis not present

## 2018-02-24 DIAGNOSIS — J449 Chronic obstructive pulmonary disease, unspecified: Secondary | ICD-10-CM | POA: Diagnosis not present

## 2018-02-24 DIAGNOSIS — K219 Gastro-esophageal reflux disease without esophagitis: Secondary | ICD-10-CM | POA: Diagnosis not present

## 2018-02-26 DIAGNOSIS — F339 Major depressive disorder, recurrent, unspecified: Secondary | ICD-10-CM | POA: Diagnosis not present

## 2018-02-26 DIAGNOSIS — J449 Chronic obstructive pulmonary disease, unspecified: Secondary | ICD-10-CM | POA: Diagnosis not present

## 2018-02-26 DIAGNOSIS — I10 Essential (primary) hypertension: Secondary | ICD-10-CM | POA: Diagnosis not present

## 2018-02-26 DIAGNOSIS — K219 Gastro-esophageal reflux disease without esophagitis: Secondary | ICD-10-CM | POA: Diagnosis not present

## 2018-02-26 DIAGNOSIS — J301 Allergic rhinitis due to pollen: Secondary | ICD-10-CM | POA: Diagnosis not present

## 2018-02-28 DIAGNOSIS — I10 Essential (primary) hypertension: Secondary | ICD-10-CM | POA: Diagnosis not present

## 2018-02-28 DIAGNOSIS — F339 Major depressive disorder, recurrent, unspecified: Secondary | ICD-10-CM | POA: Diagnosis not present

## 2018-02-28 DIAGNOSIS — J301 Allergic rhinitis due to pollen: Secondary | ICD-10-CM | POA: Diagnosis not present

## 2018-02-28 DIAGNOSIS — K219 Gastro-esophageal reflux disease without esophagitis: Secondary | ICD-10-CM | POA: Diagnosis not present

## 2018-02-28 DIAGNOSIS — J449 Chronic obstructive pulmonary disease, unspecified: Secondary | ICD-10-CM | POA: Diagnosis not present

## 2018-03-03 DIAGNOSIS — F339 Major depressive disorder, recurrent, unspecified: Secondary | ICD-10-CM | POA: Diagnosis not present

## 2018-03-03 DIAGNOSIS — I10 Essential (primary) hypertension: Secondary | ICD-10-CM | POA: Diagnosis not present

## 2018-03-03 DIAGNOSIS — K219 Gastro-esophageal reflux disease without esophagitis: Secondary | ICD-10-CM | POA: Diagnosis not present

## 2018-03-03 DIAGNOSIS — J449 Chronic obstructive pulmonary disease, unspecified: Secondary | ICD-10-CM | POA: Diagnosis not present

## 2018-03-03 DIAGNOSIS — J301 Allergic rhinitis due to pollen: Secondary | ICD-10-CM | POA: Diagnosis not present

## 2018-03-06 DIAGNOSIS — J449 Chronic obstructive pulmonary disease, unspecified: Secondary | ICD-10-CM | POA: Diagnosis not present

## 2018-03-06 DIAGNOSIS — I10 Essential (primary) hypertension: Secondary | ICD-10-CM | POA: Diagnosis not present

## 2018-03-06 DIAGNOSIS — J301 Allergic rhinitis due to pollen: Secondary | ICD-10-CM | POA: Diagnosis not present

## 2018-03-06 DIAGNOSIS — F339 Major depressive disorder, recurrent, unspecified: Secondary | ICD-10-CM | POA: Diagnosis not present

## 2018-03-06 DIAGNOSIS — K219 Gastro-esophageal reflux disease without esophagitis: Secondary | ICD-10-CM | POA: Diagnosis not present

## 2018-03-10 DIAGNOSIS — J449 Chronic obstructive pulmonary disease, unspecified: Secondary | ICD-10-CM | POA: Diagnosis not present

## 2018-03-10 DIAGNOSIS — F339 Major depressive disorder, recurrent, unspecified: Secondary | ICD-10-CM | POA: Diagnosis not present

## 2018-03-10 DIAGNOSIS — J301 Allergic rhinitis due to pollen: Secondary | ICD-10-CM | POA: Diagnosis not present

## 2018-03-10 DIAGNOSIS — K219 Gastro-esophageal reflux disease without esophagitis: Secondary | ICD-10-CM | POA: Diagnosis not present

## 2018-03-10 DIAGNOSIS — I10 Essential (primary) hypertension: Secondary | ICD-10-CM | POA: Diagnosis not present

## 2018-03-13 DIAGNOSIS — I10 Essential (primary) hypertension: Secondary | ICD-10-CM | POA: Diagnosis not present

## 2018-03-13 DIAGNOSIS — J301 Allergic rhinitis due to pollen: Secondary | ICD-10-CM | POA: Diagnosis not present

## 2018-03-13 DIAGNOSIS — F339 Major depressive disorder, recurrent, unspecified: Secondary | ICD-10-CM | POA: Diagnosis not present

## 2018-03-13 DIAGNOSIS — K219 Gastro-esophageal reflux disease without esophagitis: Secondary | ICD-10-CM | POA: Diagnosis not present

## 2018-03-13 DIAGNOSIS — J449 Chronic obstructive pulmonary disease, unspecified: Secondary | ICD-10-CM | POA: Diagnosis not present

## 2018-03-14 DIAGNOSIS — J301 Allergic rhinitis due to pollen: Secondary | ICD-10-CM | POA: Diagnosis not present

## 2018-03-14 DIAGNOSIS — J449 Chronic obstructive pulmonary disease, unspecified: Secondary | ICD-10-CM | POA: Diagnosis not present

## 2018-03-14 DIAGNOSIS — F339 Major depressive disorder, recurrent, unspecified: Secondary | ICD-10-CM | POA: Diagnosis not present

## 2018-03-14 DIAGNOSIS — I10 Essential (primary) hypertension: Secondary | ICD-10-CM | POA: Diagnosis not present

## 2018-03-14 DIAGNOSIS — K219 Gastro-esophageal reflux disease without esophagitis: Secondary | ICD-10-CM | POA: Diagnosis not present

## 2018-03-17 DIAGNOSIS — J449 Chronic obstructive pulmonary disease, unspecified: Secondary | ICD-10-CM | POA: Diagnosis not present

## 2018-03-17 DIAGNOSIS — K219 Gastro-esophageal reflux disease without esophagitis: Secondary | ICD-10-CM | POA: Diagnosis not present

## 2018-03-17 DIAGNOSIS — I10 Essential (primary) hypertension: Secondary | ICD-10-CM | POA: Diagnosis not present

## 2018-03-17 DIAGNOSIS — J301 Allergic rhinitis due to pollen: Secondary | ICD-10-CM | POA: Diagnosis not present

## 2018-03-17 DIAGNOSIS — F339 Major depressive disorder, recurrent, unspecified: Secondary | ICD-10-CM | POA: Diagnosis not present

## 2018-03-18 ENCOUNTER — Other Ambulatory Visit: Payer: Self-pay | Admitting: Family Medicine

## 2018-03-21 DIAGNOSIS — F339 Major depressive disorder, recurrent, unspecified: Secondary | ICD-10-CM | POA: Diagnosis not present

## 2018-03-21 DIAGNOSIS — J449 Chronic obstructive pulmonary disease, unspecified: Secondary | ICD-10-CM | POA: Diagnosis not present

## 2018-03-21 DIAGNOSIS — I10 Essential (primary) hypertension: Secondary | ICD-10-CM | POA: Diagnosis not present

## 2018-03-21 DIAGNOSIS — K219 Gastro-esophageal reflux disease without esophagitis: Secondary | ICD-10-CM | POA: Diagnosis not present

## 2018-03-21 DIAGNOSIS — J301 Allergic rhinitis due to pollen: Secondary | ICD-10-CM | POA: Diagnosis not present

## 2018-03-25 ENCOUNTER — Telehealth: Payer: Self-pay | Admitting: Pulmonary Disease

## 2018-03-25 NOTE — Telephone Encounter (Signed)
Attempted to call hospice nurse back. If she is requesting the Toprol XL I see that Dr. Charlett Blake, pt's PCP, has been writing that Rx and that we have not been managing the patient's blood pressure. Left a message to call back.

## 2018-03-26 ENCOUNTER — Other Ambulatory Visit: Payer: Self-pay

## 2018-03-26 DIAGNOSIS — F419 Anxiety disorder, unspecified: Principal | ICD-10-CM

## 2018-03-26 DIAGNOSIS — F329 Major depressive disorder, single episode, unspecified: Secondary | ICD-10-CM

## 2018-03-26 MED ORDER — ALPRAZOLAM 0.5 MG PO TABS
0.5000 mg | ORAL_TABLET | Freq: Two times a day (BID) | ORAL | 0 refills | Status: AC | PRN
Start: 2018-03-26 — End: ?

## 2018-03-26 NOTE — Telephone Encounter (Signed)
Attempted to call Megan with Hospice I did not receive an answer. I have left a message for Jinny [place] to return our call.

## 2018-03-26 NOTE — Telephone Encounter (Signed)
I refilled but I have to see her again with UDS and contract before anymore can be prescribed

## 2018-03-26 NOTE — Telephone Encounter (Signed)
Requesting:XANAX 0.5 MG Contract: 11/19/16 UDS: 04/19/17 Low risk Last OV:06/20/17 Next OV: - Last Refill: 08/13/17   Please advise

## 2018-03-27 DIAGNOSIS — K219 Gastro-esophageal reflux disease without esophagitis: Secondary | ICD-10-CM | POA: Diagnosis not present

## 2018-03-27 DIAGNOSIS — J301 Allergic rhinitis due to pollen: Secondary | ICD-10-CM | POA: Diagnosis not present

## 2018-03-27 DIAGNOSIS — F339 Major depressive disorder, recurrent, unspecified: Secondary | ICD-10-CM | POA: Diagnosis not present

## 2018-03-27 DIAGNOSIS — J449 Chronic obstructive pulmonary disease, unspecified: Secondary | ICD-10-CM | POA: Diagnosis not present

## 2018-03-27 DIAGNOSIS — I10 Essential (primary) hypertension: Secondary | ICD-10-CM | POA: Diagnosis not present

## 2018-03-27 NOTE — Telephone Encounter (Signed)
Spoke with [facility] with Hospice. She is aware that she will need to contact pt's PCP for this refill. Nothing further was needed.

## 2018-03-28 DIAGNOSIS — J449 Chronic obstructive pulmonary disease, unspecified: Secondary | ICD-10-CM | POA: Diagnosis not present

## 2018-03-28 DIAGNOSIS — I10 Essential (primary) hypertension: Secondary | ICD-10-CM | POA: Diagnosis not present

## 2018-03-28 DIAGNOSIS — J301 Allergic rhinitis due to pollen: Secondary | ICD-10-CM | POA: Diagnosis not present

## 2018-03-28 DIAGNOSIS — F339 Major depressive disorder, recurrent, unspecified: Secondary | ICD-10-CM | POA: Diagnosis not present

## 2018-03-28 DIAGNOSIS — K219 Gastro-esophageal reflux disease without esophagitis: Secondary | ICD-10-CM | POA: Diagnosis not present

## 2018-03-29 DIAGNOSIS — J449 Chronic obstructive pulmonary disease, unspecified: Secondary | ICD-10-CM | POA: Diagnosis not present

## 2018-03-29 DIAGNOSIS — K219 Gastro-esophageal reflux disease without esophagitis: Secondary | ICD-10-CM | POA: Diagnosis not present

## 2018-03-29 DIAGNOSIS — I10 Essential (primary) hypertension: Secondary | ICD-10-CM | POA: Diagnosis not present

## 2018-03-29 DIAGNOSIS — J301 Allergic rhinitis due to pollen: Secondary | ICD-10-CM | POA: Diagnosis not present

## 2018-03-29 DIAGNOSIS — F339 Major depressive disorder, recurrent, unspecified: Secondary | ICD-10-CM | POA: Diagnosis not present

## 2018-03-31 DIAGNOSIS — J301 Allergic rhinitis due to pollen: Secondary | ICD-10-CM | POA: Diagnosis not present

## 2018-03-31 DIAGNOSIS — K219 Gastro-esophageal reflux disease without esophagitis: Secondary | ICD-10-CM | POA: Diagnosis not present

## 2018-03-31 DIAGNOSIS — I10 Essential (primary) hypertension: Secondary | ICD-10-CM | POA: Diagnosis not present

## 2018-03-31 DIAGNOSIS — J449 Chronic obstructive pulmonary disease, unspecified: Secondary | ICD-10-CM | POA: Diagnosis not present

## 2018-03-31 DIAGNOSIS — F339 Major depressive disorder, recurrent, unspecified: Secondary | ICD-10-CM | POA: Diagnosis not present

## 2018-04-04 DIAGNOSIS — J301 Allergic rhinitis due to pollen: Secondary | ICD-10-CM | POA: Diagnosis not present

## 2018-04-04 DIAGNOSIS — I10 Essential (primary) hypertension: Secondary | ICD-10-CM | POA: Diagnosis not present

## 2018-04-04 DIAGNOSIS — F339 Major depressive disorder, recurrent, unspecified: Secondary | ICD-10-CM | POA: Diagnosis not present

## 2018-04-04 DIAGNOSIS — J449 Chronic obstructive pulmonary disease, unspecified: Secondary | ICD-10-CM | POA: Diagnosis not present

## 2018-04-04 DIAGNOSIS — K219 Gastro-esophageal reflux disease without esophagitis: Secondary | ICD-10-CM | POA: Diagnosis not present

## 2018-04-07 DIAGNOSIS — F339 Major depressive disorder, recurrent, unspecified: Secondary | ICD-10-CM | POA: Diagnosis not present

## 2018-04-07 DIAGNOSIS — I10 Essential (primary) hypertension: Secondary | ICD-10-CM | POA: Diagnosis not present

## 2018-04-07 DIAGNOSIS — J301 Allergic rhinitis due to pollen: Secondary | ICD-10-CM | POA: Diagnosis not present

## 2018-04-07 DIAGNOSIS — K219 Gastro-esophageal reflux disease without esophagitis: Secondary | ICD-10-CM | POA: Diagnosis not present

## 2018-04-07 DIAGNOSIS — J449 Chronic obstructive pulmonary disease, unspecified: Secondary | ICD-10-CM | POA: Diagnosis not present

## 2018-04-08 DIAGNOSIS — K219 Gastro-esophageal reflux disease without esophagitis: Secondary | ICD-10-CM | POA: Diagnosis not present

## 2018-04-08 DIAGNOSIS — J301 Allergic rhinitis due to pollen: Secondary | ICD-10-CM | POA: Diagnosis not present

## 2018-04-08 DIAGNOSIS — F339 Major depressive disorder, recurrent, unspecified: Secondary | ICD-10-CM | POA: Diagnosis not present

## 2018-04-08 DIAGNOSIS — I10 Essential (primary) hypertension: Secondary | ICD-10-CM | POA: Diagnosis not present

## 2018-04-08 DIAGNOSIS — J449 Chronic obstructive pulmonary disease, unspecified: Secondary | ICD-10-CM | POA: Diagnosis not present

## 2018-04-10 ENCOUNTER — Other Ambulatory Visit: Payer: Self-pay

## 2018-04-10 DIAGNOSIS — F339 Major depressive disorder, recurrent, unspecified: Secondary | ICD-10-CM | POA: Diagnosis not present

## 2018-04-10 DIAGNOSIS — J449 Chronic obstructive pulmonary disease, unspecified: Secondary | ICD-10-CM | POA: Diagnosis not present

## 2018-04-10 DIAGNOSIS — K219 Gastro-esophageal reflux disease without esophagitis: Secondary | ICD-10-CM | POA: Diagnosis not present

## 2018-04-10 DIAGNOSIS — J301 Allergic rhinitis due to pollen: Secondary | ICD-10-CM | POA: Diagnosis not present

## 2018-04-10 DIAGNOSIS — I10 Essential (primary) hypertension: Secondary | ICD-10-CM | POA: Diagnosis not present

## 2018-04-10 MED ORDER — METOPROLOL SUCCINATE ER 25 MG PO TB24: 25.0000 mg | ORAL_TABLET | Freq: Every day | ORAL | 1 refills | Status: AC

## 2018-04-11 DIAGNOSIS — F339 Major depressive disorder, recurrent, unspecified: Secondary | ICD-10-CM | POA: Diagnosis not present

## 2018-04-11 DIAGNOSIS — J301 Allergic rhinitis due to pollen: Secondary | ICD-10-CM | POA: Diagnosis not present

## 2018-04-11 DIAGNOSIS — K219 Gastro-esophageal reflux disease without esophagitis: Secondary | ICD-10-CM | POA: Diagnosis not present

## 2018-04-11 DIAGNOSIS — I10 Essential (primary) hypertension: Secondary | ICD-10-CM | POA: Diagnosis not present

## 2018-04-11 DIAGNOSIS — J449 Chronic obstructive pulmonary disease, unspecified: Secondary | ICD-10-CM | POA: Diagnosis not present

## 2018-04-14 ENCOUNTER — Telehealth: Payer: Self-pay | Admitting: Cardiology

## 2018-04-14 DIAGNOSIS — J301 Allergic rhinitis due to pollen: Secondary | ICD-10-CM | POA: Diagnosis not present

## 2018-04-14 DIAGNOSIS — K219 Gastro-esophageal reflux disease without esophagitis: Secondary | ICD-10-CM | POA: Diagnosis not present

## 2018-04-14 DIAGNOSIS — F339 Major depressive disorder, recurrent, unspecified: Secondary | ICD-10-CM | POA: Diagnosis not present

## 2018-04-14 DIAGNOSIS — J449 Chronic obstructive pulmonary disease, unspecified: Secondary | ICD-10-CM | POA: Diagnosis not present

## 2018-04-14 DIAGNOSIS — I10 Essential (primary) hypertension: Secondary | ICD-10-CM | POA: Diagnosis not present

## 2018-04-14 NOTE — Telephone Encounter (Signed)
Error no note needed °

## 2018-04-17 DIAGNOSIS — J449 Chronic obstructive pulmonary disease, unspecified: Secondary | ICD-10-CM | POA: Diagnosis not present

## 2018-04-17 DIAGNOSIS — K219 Gastro-esophageal reflux disease without esophagitis: Secondary | ICD-10-CM | POA: Diagnosis not present

## 2018-04-17 DIAGNOSIS — F339 Major depressive disorder, recurrent, unspecified: Secondary | ICD-10-CM | POA: Diagnosis not present

## 2018-04-17 DIAGNOSIS — I10 Essential (primary) hypertension: Secondary | ICD-10-CM | POA: Diagnosis not present

## 2018-04-17 DIAGNOSIS — J301 Allergic rhinitis due to pollen: Secondary | ICD-10-CM | POA: Diagnosis not present

## 2018-04-18 ENCOUNTER — Other Ambulatory Visit: Payer: Self-pay | Admitting: Family Medicine

## 2018-04-18 DIAGNOSIS — F339 Major depressive disorder, recurrent, unspecified: Secondary | ICD-10-CM | POA: Diagnosis not present

## 2018-04-18 DIAGNOSIS — J301 Allergic rhinitis due to pollen: Secondary | ICD-10-CM | POA: Diagnosis not present

## 2018-04-18 DIAGNOSIS — J449 Chronic obstructive pulmonary disease, unspecified: Secondary | ICD-10-CM | POA: Diagnosis not present

## 2018-04-18 DIAGNOSIS — I10 Essential (primary) hypertension: Secondary | ICD-10-CM | POA: Diagnosis not present

## 2018-04-18 DIAGNOSIS — K219 Gastro-esophageal reflux disease without esophagitis: Secondary | ICD-10-CM | POA: Diagnosis not present

## 2018-04-21 DIAGNOSIS — F339 Major depressive disorder, recurrent, unspecified: Secondary | ICD-10-CM | POA: Diagnosis not present

## 2018-04-21 DIAGNOSIS — J449 Chronic obstructive pulmonary disease, unspecified: Secondary | ICD-10-CM | POA: Diagnosis not present

## 2018-04-21 DIAGNOSIS — K219 Gastro-esophageal reflux disease without esophagitis: Secondary | ICD-10-CM | POA: Diagnosis not present

## 2018-04-21 DIAGNOSIS — I10 Essential (primary) hypertension: Secondary | ICD-10-CM | POA: Diagnosis not present

## 2018-04-21 DIAGNOSIS — J301 Allergic rhinitis due to pollen: Secondary | ICD-10-CM | POA: Diagnosis not present

## 2018-04-24 DIAGNOSIS — F339 Major depressive disorder, recurrent, unspecified: Secondary | ICD-10-CM | POA: Diagnosis not present

## 2018-04-24 DIAGNOSIS — J449 Chronic obstructive pulmonary disease, unspecified: Secondary | ICD-10-CM | POA: Diagnosis not present

## 2018-04-24 DIAGNOSIS — K219 Gastro-esophageal reflux disease without esophagitis: Secondary | ICD-10-CM | POA: Diagnosis not present

## 2018-04-24 DIAGNOSIS — I10 Essential (primary) hypertension: Secondary | ICD-10-CM | POA: Diagnosis not present

## 2018-04-24 DIAGNOSIS — J301 Allergic rhinitis due to pollen: Secondary | ICD-10-CM | POA: Diagnosis not present

## 2018-04-28 DIAGNOSIS — J449 Chronic obstructive pulmonary disease, unspecified: Secondary | ICD-10-CM | POA: Diagnosis not present

## 2018-04-28 DIAGNOSIS — K219 Gastro-esophageal reflux disease without esophagitis: Secondary | ICD-10-CM | POA: Diagnosis not present

## 2018-04-28 DIAGNOSIS — I10 Essential (primary) hypertension: Secondary | ICD-10-CM | POA: Diagnosis not present

## 2018-04-28 DIAGNOSIS — J301 Allergic rhinitis due to pollen: Secondary | ICD-10-CM | POA: Diagnosis not present

## 2018-04-28 DIAGNOSIS — F339 Major depressive disorder, recurrent, unspecified: Secondary | ICD-10-CM | POA: Diagnosis not present

## 2018-05-02 DIAGNOSIS — F339 Major depressive disorder, recurrent, unspecified: Secondary | ICD-10-CM | POA: Diagnosis not present

## 2018-05-02 DIAGNOSIS — J301 Allergic rhinitis due to pollen: Secondary | ICD-10-CM | POA: Diagnosis not present

## 2018-05-02 DIAGNOSIS — K219 Gastro-esophageal reflux disease without esophagitis: Secondary | ICD-10-CM | POA: Diagnosis not present

## 2018-05-02 DIAGNOSIS — I10 Essential (primary) hypertension: Secondary | ICD-10-CM | POA: Diagnosis not present

## 2018-05-02 DIAGNOSIS — J449 Chronic obstructive pulmonary disease, unspecified: Secondary | ICD-10-CM | POA: Diagnosis not present

## 2018-05-05 DIAGNOSIS — F339 Major depressive disorder, recurrent, unspecified: Secondary | ICD-10-CM | POA: Diagnosis not present

## 2018-05-05 DIAGNOSIS — K219 Gastro-esophageal reflux disease without esophagitis: Secondary | ICD-10-CM | POA: Diagnosis not present

## 2018-05-05 DIAGNOSIS — J301 Allergic rhinitis due to pollen: Secondary | ICD-10-CM | POA: Diagnosis not present

## 2018-05-05 DIAGNOSIS — J449 Chronic obstructive pulmonary disease, unspecified: Secondary | ICD-10-CM | POA: Diagnosis not present

## 2018-05-05 DIAGNOSIS — I10 Essential (primary) hypertension: Secondary | ICD-10-CM | POA: Diagnosis not present

## 2018-05-07 DIAGNOSIS — K219 Gastro-esophageal reflux disease without esophagitis: Secondary | ICD-10-CM | POA: Diagnosis not present

## 2018-05-07 DIAGNOSIS — F339 Major depressive disorder, recurrent, unspecified: Secondary | ICD-10-CM | POA: Diagnosis not present

## 2018-05-07 DIAGNOSIS — J449 Chronic obstructive pulmonary disease, unspecified: Secondary | ICD-10-CM | POA: Diagnosis not present

## 2018-05-07 DIAGNOSIS — I10 Essential (primary) hypertension: Secondary | ICD-10-CM | POA: Diagnosis not present

## 2018-05-07 DIAGNOSIS — J301 Allergic rhinitis due to pollen: Secondary | ICD-10-CM | POA: Diagnosis not present

## 2018-05-12 DIAGNOSIS — J449 Chronic obstructive pulmonary disease, unspecified: Secondary | ICD-10-CM | POA: Diagnosis not present

## 2018-05-12 DIAGNOSIS — J301 Allergic rhinitis due to pollen: Secondary | ICD-10-CM | POA: Diagnosis not present

## 2018-05-12 DIAGNOSIS — F339 Major depressive disorder, recurrent, unspecified: Secondary | ICD-10-CM | POA: Diagnosis not present

## 2018-05-12 DIAGNOSIS — K219 Gastro-esophageal reflux disease without esophagitis: Secondary | ICD-10-CM | POA: Diagnosis not present

## 2018-05-12 DIAGNOSIS — I10 Essential (primary) hypertension: Secondary | ICD-10-CM | POA: Diagnosis not present

## 2018-05-15 ENCOUNTER — Other Ambulatory Visit: Payer: Self-pay | Admitting: Family Medicine

## 2018-05-15 DIAGNOSIS — J449 Chronic obstructive pulmonary disease, unspecified: Secondary | ICD-10-CM | POA: Diagnosis not present

## 2018-05-15 DIAGNOSIS — K219 Gastro-esophageal reflux disease without esophagitis: Secondary | ICD-10-CM | POA: Diagnosis not present

## 2018-05-15 DIAGNOSIS — J301 Allergic rhinitis due to pollen: Secondary | ICD-10-CM | POA: Diagnosis not present

## 2018-05-15 DIAGNOSIS — F339 Major depressive disorder, recurrent, unspecified: Secondary | ICD-10-CM | POA: Diagnosis not present

## 2018-05-15 DIAGNOSIS — I10 Essential (primary) hypertension: Secondary | ICD-10-CM | POA: Diagnosis not present

## 2018-05-16 DIAGNOSIS — J301 Allergic rhinitis due to pollen: Secondary | ICD-10-CM | POA: Diagnosis not present

## 2018-05-16 DIAGNOSIS — I10 Essential (primary) hypertension: Secondary | ICD-10-CM | POA: Diagnosis not present

## 2018-05-16 DIAGNOSIS — F339 Major depressive disorder, recurrent, unspecified: Secondary | ICD-10-CM | POA: Diagnosis not present

## 2018-05-16 DIAGNOSIS — K219 Gastro-esophageal reflux disease without esophagitis: Secondary | ICD-10-CM | POA: Diagnosis not present

## 2018-05-16 DIAGNOSIS — J449 Chronic obstructive pulmonary disease, unspecified: Secondary | ICD-10-CM | POA: Diagnosis not present

## 2018-05-19 DIAGNOSIS — F339 Major depressive disorder, recurrent, unspecified: Secondary | ICD-10-CM | POA: Diagnosis not present

## 2018-05-19 DIAGNOSIS — J301 Allergic rhinitis due to pollen: Secondary | ICD-10-CM | POA: Diagnosis not present

## 2018-05-19 DIAGNOSIS — J449 Chronic obstructive pulmonary disease, unspecified: Secondary | ICD-10-CM | POA: Diagnosis not present

## 2018-05-19 DIAGNOSIS — K219 Gastro-esophageal reflux disease without esophagitis: Secondary | ICD-10-CM | POA: Diagnosis not present

## 2018-05-19 DIAGNOSIS — I10 Essential (primary) hypertension: Secondary | ICD-10-CM | POA: Diagnosis not present

## 2018-05-22 DIAGNOSIS — J449 Chronic obstructive pulmonary disease, unspecified: Secondary | ICD-10-CM | POA: Diagnosis not present

## 2018-05-22 DIAGNOSIS — F339 Major depressive disorder, recurrent, unspecified: Secondary | ICD-10-CM | POA: Diagnosis not present

## 2018-05-22 DIAGNOSIS — I10 Essential (primary) hypertension: Secondary | ICD-10-CM | POA: Diagnosis not present

## 2018-05-22 DIAGNOSIS — K219 Gastro-esophageal reflux disease without esophagitis: Secondary | ICD-10-CM | POA: Diagnosis not present

## 2018-05-22 DIAGNOSIS — J301 Allergic rhinitis due to pollen: Secondary | ICD-10-CM | POA: Diagnosis not present

## 2018-05-23 DIAGNOSIS — J301 Allergic rhinitis due to pollen: Secondary | ICD-10-CM | POA: Diagnosis not present

## 2018-05-23 DIAGNOSIS — J449 Chronic obstructive pulmonary disease, unspecified: Secondary | ICD-10-CM | POA: Diagnosis not present

## 2018-05-23 DIAGNOSIS — K219 Gastro-esophageal reflux disease without esophagitis: Secondary | ICD-10-CM | POA: Diagnosis not present

## 2018-05-23 DIAGNOSIS — F339 Major depressive disorder, recurrent, unspecified: Secondary | ICD-10-CM | POA: Diagnosis not present

## 2018-05-23 DIAGNOSIS — I10 Essential (primary) hypertension: Secondary | ICD-10-CM | POA: Diagnosis not present

## 2018-05-26 DIAGNOSIS — J449 Chronic obstructive pulmonary disease, unspecified: Secondary | ICD-10-CM | POA: Diagnosis not present

## 2018-05-26 DIAGNOSIS — F339 Major depressive disorder, recurrent, unspecified: Secondary | ICD-10-CM | POA: Diagnosis not present

## 2018-05-26 DIAGNOSIS — J301 Allergic rhinitis due to pollen: Secondary | ICD-10-CM | POA: Diagnosis not present

## 2018-05-26 DIAGNOSIS — I10 Essential (primary) hypertension: Secondary | ICD-10-CM | POA: Diagnosis not present

## 2018-05-26 DIAGNOSIS — K219 Gastro-esophageal reflux disease without esophagitis: Secondary | ICD-10-CM | POA: Diagnosis not present

## 2018-05-29 ENCOUNTER — Encounter: Payer: Self-pay | Admitting: Gastroenterology

## 2018-05-29 DIAGNOSIS — J449 Chronic obstructive pulmonary disease, unspecified: Secondary | ICD-10-CM | POA: Diagnosis not present

## 2018-05-29 DIAGNOSIS — F339 Major depressive disorder, recurrent, unspecified: Secondary | ICD-10-CM | POA: Diagnosis not present

## 2018-05-29 DIAGNOSIS — J301 Allergic rhinitis due to pollen: Secondary | ICD-10-CM | POA: Diagnosis not present

## 2018-05-29 DIAGNOSIS — I10 Essential (primary) hypertension: Secondary | ICD-10-CM | POA: Diagnosis not present

## 2018-05-29 DIAGNOSIS — K219 Gastro-esophageal reflux disease without esophagitis: Secondary | ICD-10-CM | POA: Diagnosis not present

## 2018-05-30 DIAGNOSIS — K219 Gastro-esophageal reflux disease without esophagitis: Secondary | ICD-10-CM | POA: Diagnosis not present

## 2018-05-30 DIAGNOSIS — I10 Essential (primary) hypertension: Secondary | ICD-10-CM | POA: Diagnosis not present

## 2018-05-30 DIAGNOSIS — J301 Allergic rhinitis due to pollen: Secondary | ICD-10-CM | POA: Diagnosis not present

## 2018-05-30 DIAGNOSIS — J449 Chronic obstructive pulmonary disease, unspecified: Secondary | ICD-10-CM | POA: Diagnosis not present

## 2018-05-30 DIAGNOSIS — F339 Major depressive disorder, recurrent, unspecified: Secondary | ICD-10-CM | POA: Diagnosis not present

## 2018-06-02 DIAGNOSIS — I10 Essential (primary) hypertension: Secondary | ICD-10-CM | POA: Diagnosis not present

## 2018-06-02 DIAGNOSIS — J449 Chronic obstructive pulmonary disease, unspecified: Secondary | ICD-10-CM | POA: Diagnosis not present

## 2018-06-02 DIAGNOSIS — F339 Major depressive disorder, recurrent, unspecified: Secondary | ICD-10-CM | POA: Diagnosis not present

## 2018-06-02 DIAGNOSIS — K219 Gastro-esophageal reflux disease without esophagitis: Secondary | ICD-10-CM | POA: Diagnosis not present

## 2018-06-02 DIAGNOSIS — J301 Allergic rhinitis due to pollen: Secondary | ICD-10-CM | POA: Diagnosis not present

## 2018-06-03 ENCOUNTER — Telehealth: Payer: Self-pay | Admitting: Pulmonary Disease

## 2018-06-03 DIAGNOSIS — I10 Essential (primary) hypertension: Secondary | ICD-10-CM | POA: Diagnosis not present

## 2018-06-03 DIAGNOSIS — J449 Chronic obstructive pulmonary disease, unspecified: Secondary | ICD-10-CM | POA: Diagnosis not present

## 2018-06-03 DIAGNOSIS — K219 Gastro-esophageal reflux disease without esophagitis: Secondary | ICD-10-CM | POA: Diagnosis not present

## 2018-06-03 DIAGNOSIS — J301 Allergic rhinitis due to pollen: Secondary | ICD-10-CM | POA: Diagnosis not present

## 2018-06-03 DIAGNOSIS — F339 Major depressive disorder, recurrent, unspecified: Secondary | ICD-10-CM | POA: Diagnosis not present

## 2018-06-03 NOTE — Telephone Encounter (Signed)
Called patient, unable to reach left message to give us a call back. 

## 2018-06-04 ENCOUNTER — Ambulatory Visit (INDEPENDENT_AMBULATORY_CARE_PROVIDER_SITE_OTHER)
Admission: RE | Admit: 2018-06-04 | Discharge: 2018-06-04 | Disposition: A | Discharge status: Home / Self Care | Source: Ambulatory Visit | Attending: Nurse Practitioner | Admitting: Nurse Practitioner

## 2018-06-04 ENCOUNTER — Other Ambulatory Visit: Payer: Self-pay | Admitting: General Surgery

## 2018-06-04 ENCOUNTER — Ambulatory Visit (INDEPENDENT_AMBULATORY_CARE_PROVIDER_SITE_OTHER): Payer: Medicare Other | Admitting: Nurse Practitioner

## 2018-06-04 ENCOUNTER — Encounter: Payer: Self-pay | Admitting: Nurse Practitioner

## 2018-06-04 DIAGNOSIS — S46911A Strain of unspecified muscle, fascia and tendon at shoulder and upper arm level, right arm, initial encounter: Secondary | ICD-10-CM | POA: Insufficient documentation

## 2018-06-04 DIAGNOSIS — J301 Allergic rhinitis due to pollen: Secondary | ICD-10-CM | POA: Diagnosis not present

## 2018-06-04 DIAGNOSIS — J449 Chronic obstructive pulmonary disease, unspecified: Secondary | ICD-10-CM | POA: Diagnosis not present

## 2018-06-04 DIAGNOSIS — R0602 Shortness of breath: Secondary | ICD-10-CM

## 2018-06-04 DIAGNOSIS — F339 Major depressive disorder, recurrent, unspecified: Secondary | ICD-10-CM | POA: Diagnosis not present

## 2018-06-04 DIAGNOSIS — K219 Gastro-esophageal reflux disease without esophagitis: Secondary | ICD-10-CM | POA: Diagnosis not present

## 2018-06-04 DIAGNOSIS — I10 Essential (primary) hypertension: Secondary | ICD-10-CM | POA: Diagnosis not present

## 2018-06-04 NOTE — Progress Notes (Signed)
@Patient  ID: [name], female    DOB: 1941-05-17, 77 y.o.   MRN: [id number]  Chief Complaint  Patient presents with  . Shortness of Breath    Worse due to fall over a week ago. Fell on right side. Xray done today.    Referring provider: Mosie Lukes, MD    HPI   77 year old female, heavy ex-smoker (quit 2002) with COPD FEV1 26% 6/10 on 24 hour O2 since 2009. Followed by Dr. Elsworth Soho -completed pulm rehab oct '11  Recurrent flares 2-3/yr requiring steroid bursts  She uses 3 liters 24/7. On a regimen of budesonide/brovana &alb/atrovent nebs   Patient presents today complaining of shortness of breath and right side pain after a fall. She fell 1 week ago at home in her bathroom. She fell onto the side of the tub. She complains of right side pain, right shoulder pain, and shortness of breath. She states that her entire right side hurts, both is most painful at right shoulder. States that she does not have good strength in her right arm and shoulder since fall. Is requesting chest x ray today due to the right side pain and shortness of breath. Denies any fever or cough.   Note: Since her fall hospice has ordered ultram and morphine  Significant tests/Encounters:   Plan from [place] 11-14-17 Very severe COPD currently compensated without exacerbation.  Patient remains under hospice care and appears to be stable.  She has not had a flare of symptoms with the discontinuation of Pulmicort and Brovana.  Continue on her current regimen with DuoNeb 4 times a day with option to have extra treatments along with prednisone 20 mg daily. She is to follow-up in 4 weeks per her request and as needed   Admitted 12/2012 for COPD exacerbation .ABG 7.39/55/75 on 3L [place]  She required low dose prednisone since her admit in 02/2013   Had colonoscopy with polyp removal 06/2013  08/2013 BL pneumonia  03/2014 ONO on 2L >>no sig desatn  CT chest 10/2014 neg PE  12/06/2014 >>2.5 L at rest , 3L on  exertion - ONO ok in the past [facility]  Hospice referral 07/2017   10/03/2017 Follow up ; COPD , O2 RF     Allergies  Allergen Reactions  . Morphine Other (See Comments)    REACTION: nightmares  . Ambien [Zolpidem] Anxiety  . Cymbalta [Duloxetine Hcl] Anxiety  . Nitrofurantoin Rash    Immunization History  Administered Date(s) Administered  . H1N1 11/02/2008  . Influenza Split 07/31/2011  . Influenza Whole 09/22/2007, 08/18/2009, 08/02/2010  . Influenza, High Dose Seasonal PF 06/26/2016, 08/08/2017  . Influenza,inj,Quad PF,6+ Mos 07/14/2013, 08/05/2014, 07/14/2015  . Influenza-Unspecified 09/20/2012  . Pneumococcal Conjugate-13 08/10/2014  . Pneumococcal Polysaccharide-23 11/30/2003, 08/02/2010  . Td 12/14/2002  . Tdap 08/31/2013  . Zoster 01/26/2011    Past Medical History:  Diagnosis Date  . Anxiety   . Arthralgia   . Atrophic vaginitis 12/07/2012  . Chest pain   . Clostridium difficile diarrhea 12/27/2014  . COPD (chronic obstructive pulmonary disease) ([place])   . Depression   . GERD (gastroesophageal reflux disease)   . Glucose intolerance (impaired glucose tolerance)   . History of small bowel obstruction   . Hx of adenomatous colonic polyps   . Hyperglycemia 12/01/2013  . Hyperlipidemia 03/19/2017  . Hypertension   . Ischemic colitis ([place])   . Medicare annual wellness visit, subsequent 01/12/2016  . Migraine   .  Nocturia 09/02/2013  . Osteoporosis   . Thyroid disease   . Weight loss     Tobacco History: Social History   Tobacco Use  Smoking Status Former Smoker  . Packs/day: 2.00  . Years: 40.00  . Pack years: 80.00  . Types: Cigarettes  . Last attempt to quit: 10/29/2001  . Years since quitting: 16.6  Smokeless Tobacco Never Used   Counseling given: former smoker   Outpatient Encounter Medications as of 06/04/2018  Medication Sig  . albuterol (PROVENTIL HFA;VENTOLIN HFA) 108 (90 BASE) MCG/ACT inhaler Inhale 2 puffs  into the lungs every 6 (six) hours as needed for wheezing or shortness of breath.  . ALPRAZolam (XANAX) 0.5 MG tablet Take 1 tablet (0.5 mg total) by mouth 2 (two) times daily as needed. for anxiety  . arformoterol (BROVANA) 15 MCG/2ML NEBU Take 2 mLs (15 mcg total) by nebulization 2 (two) times daily. Dx J44.9  . budesonide (PULMICORT) 0.25 MG/2ML nebulizer solution Take 2 mLs (0.25 mg total) by nebulization 2 (two) times daily. Dx: J44.9  . Calcium Carbonate-Vitamin D 600-400 MG-UNIT per tablet Take 1 tablet by mouth 2 (two) times daily.    . citalopram (CELEXA) 20 MG tablet TAKE 1 TABLET(20 MG) BY MOUTH DAILY  . citalopram (CELEXA) 40 MG tablet Take 1 tablet (40 mg total) by mouth daily. (Patient taking differently: Take 20 mg by mouth daily. )  . Ferrous Fumarate-Folic Acid (HEMOCYTE-F) 324-1 MG TABS Take 1 tablet by mouth daily.  . fluticasone (FLONASE) 50 MCG/ACT nasal spray Place 2 sprays into both nostrils 2 (two) times daily as needed.   . Glucosamine-Chondroitin (GLUCOSAMINE CHONDR COMPLEX PO) Take 1 tablet by mouth 2 (two) times daily.   Marland Kitchen ipratropium (ATROVENT) 0.02 % nebulizer solution Take 2.5 mLs (0.5 mg total) by nebulization 4 (four) times daily.  Marland Kitchen ipratropium-albuterol (DUONEB) 0.5-2.5 (3) MG/3ML SOLN Take 3 mLs by nebulization every 6 (six) hours as needed.  . metoprolol succinate (TOPROL-XL) 25 MG 24 hr tablet Take 1 tablet (25 mg total) by mouth daily.  . mirtazapine (REMERON) 15 MG tablet Take 1 tablet (15 mg total) by mouth at bedtime.  . pantoprazole (PROTONIX) 40 MG tablet TAKE 1 TABLET(40 MG) BY MOUTH DAILY  . predniSONE (DELTASONE) 20 MG tablet Take 1 tablet (20 mg total) by mouth daily with breakfast.   No facility-administered encounter medications on file as of 06/04/2018.      Review of Systems  Review of Systems  Constitutional: Negative for activity change and fever.  Respiratory: Positive for shortness of breath. Negative for cough and wheezing.     Gastrointestinal: Negative.   Musculoskeletal:       Right shoulder pain  Skin: Negative.   Psychiatric/Behavioral: Negative.        Physical Exam  BP 102/68 (BP Location: Left Arm, Patient Position: Sitting, Cuff Size: Normal)   Pulse (!) 102   Ht 5\' 1"  (1.549 m)   Wt 99 lb 3.2 oz (45 kg)   SpO2 98%   BMI 18.74 kg/m   Wt Readings from Last 5 Encounters:  06/04/18 99 lb 3.2 oz (45 kg)  11/14/17 101 lb (45.8 kg)  08/08/17 98 lb (44.5 kg)  06/20/17 95 lb 9.6 oz (43.4 kg)  06/13/17 93 lb (42.2 kg)     Physical Exam  Constitutional: She is oriented to person, place, and time. She appears well-developed and well-nourished. No distress.  Cardiovascular: Normal rate and regular rhythm.  Pulmonary/Chest: Effort normal and breath sounds normal.  Musculoskeletal:       Right shoulder: She exhibits decreased range of motion, tenderness, pain and decreased strength. She exhibits no swelling.       Arms: Neurological: She is alert and oriented to person, place, and time.  Psychiatric: She has a normal mood and affect.  Nursing note and vitals reviewed.     Imaging: Dg Ribs Unilateral W/chest Right  Result Date: 06/04/2018 CLINICAL DATA:  Patient fell 7 days and has had persistent right posterior ribcage and right shoulder pain. History of COPD. Former smoker. EXAM: RIGHT RIBS AND CHEST - 3+ VIEW COMPARISON:  PA and lateral chest x-ray of February 07, 2017 FINDINGS: The lungs remain hyperinflated. There is no pneumothorax, pleural effusion, or pulmonary contusion. The heart and pulmonary vascularity are normal. There is calcification in the wall of the aortic arch. The bony thorax exhibits no acute abnormality. Specific attention to the right lower ribs reveals no acute fracture. IMPRESSION: Chronic bronchitic changes. No acute post traumatic injury of the thorax. The observed right ribs exhibit no acute abnormalities. Electronically Signed   By: David  Martinique M.D.   On: 06/04/2018 15:04      Assessment & Plan:   Shoulder strain, right, initial encounter R.I.C.E. advised Please take Ultram sparingly (patient agrees) Please stop morphine Will refer to ortho for shoulder evaluation   COPD mixed type ([place]) Please use Ultram sparingly Please stop morphine Continue other medications as directed Please call if symptoms worsen     Fenton Foy, NP 06/04/2018

## 2018-06-04 NOTE — Assessment & Plan Note (Addendum)
R.I.C.E. advised Please take Ultram sparingly (patient agrees) Please stop morphine Will refer to ortho for shoulder evaluation

## 2018-06-04 NOTE — Patient Instructions (Addendum)
Shoulder Sprain A shoulder sprain is a partial or complete tear in one of the tough, fiber-like tissues (ligaments) in the shoulder. The ligaments in the shoulder help to hold the shoulder in place. What are the causes? This condition may be caused by:  A fall.  A hit to the shoulder.  A twist of the arm.  What increases the risk? This condition is more likely to develop in:  People who play sports.  People who have problems with balance or coordination.  What are the signs or symptoms? Symptoms of this condition include:  Pain when moving the shoulder.  Limited ability to move the shoulder.  Swelling and tenderness on top of the shoulder.  Warmth in the shoulder.  A change in the shape of the shoulder.  Redness or bruising on the shoulder.  How is this diagnosed? This condition is diagnosed with a physical exam. During the exam, you may be asked to do simple exercises with your shoulder. You may also have imaging tests, such as X-rays, MRI, or a CT scan. These tests can show how severe the sprain is. How is this treated? This condition may be treated with:  Rest.  Pain medicine.  Ice.  A sling or brace. This is used to keep the arm still while the shoulder is healing.  Physical therapy or rehabilitation exercises. These help to improve the range of motion and strength of the shoulder.  Surgery (rare). Surgery may be needed if the sprain caused a joint to become unstable. Surgery may also be needed to reduce pain.  Some people may develop ongoing shoulder pain or lose some range of motion in the shoulder. However, most people do not develop long-term problems. Follow these instructions at home:  Rest.  Ask your health care provider when it is safe for you to drive if you have a sling or brace on your shoulder.  Take over-the-counter and prescription medicines only as told by your health care provider.  If directed, apply ice to the area: ? Put ice in a  plastic bag. ? Place a towel between your skin and the bag. ? Leave the ice on for 20 minutes, 2-3 times per day.  If you were given a shoulder sling or brace: ? Wear it as told. ? Remove it to shower or bathe. ? Move your arm only as much as told by your health care provider, but keep your hand moving to prevent swelling.  If you were shown how to do any exercises, do them as told by your health care provider.  Keep all follow-up visits as told by your health care provider. This is important. Contact a health care provider if:  Your pain gets worse.  Your pain is not relieved with medicines.  You have increased redness or swelling. Get help right away if:  You have a fever.  You cannot move your arm or shoulder.  You develop severe numbness or tingling in your arm, hand, or fingers.  Your arm, hand, or fingers turn blue, white, or gray and feel cold. This information is not intended to replace advice given to you by your health care provider. Make sure you discuss any questions you have with your health care provider. Document Released: 03/03/2009 Document Revised: 06/10/2016 Document Reviewed: 02/07/2015 Elsevier Interactive Patient Education  2018 Elsevier Inc.  

## 2018-06-04 NOTE — Telephone Encounter (Signed)
Called and spoke with pt regarding not breathing well since pt fell in her bathroom 2 days ago. Pt reports SOB, wheezing, not breathing well, chest tightness and pain on side where she fell Pt requesting CXR on chest at ov today Scheduled appt with TN at 3pm today Nothing further needed at this time.

## 2018-06-04 NOTE — Assessment & Plan Note (Signed)
Please use Ultram sparingly Please stop morphine Continue other medications as directed Please call if symptoms worsen

## 2018-06-04 NOTE — Telephone Encounter (Signed)
Attempted to call pt. I did not receive an answer. I have left a message for pt to return our call.  

## 2018-06-05 DIAGNOSIS — K219 Gastro-esophageal reflux disease without esophagitis: Secondary | ICD-10-CM | POA: Diagnosis not present

## 2018-06-05 DIAGNOSIS — F339 Major depressive disorder, recurrent, unspecified: Secondary | ICD-10-CM | POA: Diagnosis not present

## 2018-06-05 DIAGNOSIS — J301 Allergic rhinitis due to pollen: Secondary | ICD-10-CM | POA: Diagnosis not present

## 2018-06-05 DIAGNOSIS — J449 Chronic obstructive pulmonary disease, unspecified: Secondary | ICD-10-CM | POA: Diagnosis not present

## 2018-06-05 DIAGNOSIS — I10 Essential (primary) hypertension: Secondary | ICD-10-CM | POA: Diagnosis not present

## 2018-06-06 DIAGNOSIS — I10 Essential (primary) hypertension: Secondary | ICD-10-CM | POA: Diagnosis not present

## 2018-06-06 DIAGNOSIS — K219 Gastro-esophageal reflux disease without esophagitis: Secondary | ICD-10-CM | POA: Diagnosis not present

## 2018-06-06 DIAGNOSIS — F339 Major depressive disorder, recurrent, unspecified: Secondary | ICD-10-CM | POA: Diagnosis not present

## 2018-06-06 DIAGNOSIS — J301 Allergic rhinitis due to pollen: Secondary | ICD-10-CM | POA: Diagnosis not present

## 2018-06-06 DIAGNOSIS — J449 Chronic obstructive pulmonary disease, unspecified: Secondary | ICD-10-CM | POA: Diagnosis not present

## 2018-06-10 ENCOUNTER — Other Ambulatory Visit: Payer: Self-pay | Admitting: Family Medicine

## 2018-06-10 DIAGNOSIS — J449 Chronic obstructive pulmonary disease, unspecified: Secondary | ICD-10-CM | POA: Diagnosis not present

## 2018-06-10 DIAGNOSIS — F339 Major depressive disorder, recurrent, unspecified: Secondary | ICD-10-CM | POA: Diagnosis not present

## 2018-06-10 DIAGNOSIS — I10 Essential (primary) hypertension: Secondary | ICD-10-CM | POA: Diagnosis not present

## 2018-06-10 DIAGNOSIS — K219 Gastro-esophageal reflux disease without esophagitis: Secondary | ICD-10-CM | POA: Diagnosis not present

## 2018-06-10 DIAGNOSIS — J301 Allergic rhinitis due to pollen: Secondary | ICD-10-CM | POA: Diagnosis not present

## 2018-06-12 ENCOUNTER — Telehealth: Payer: Self-pay | Admitting: Nurse Practitioner

## 2018-06-12 DIAGNOSIS — I10 Essential (primary) hypertension: Secondary | ICD-10-CM | POA: Diagnosis not present

## 2018-06-12 DIAGNOSIS — J449 Chronic obstructive pulmonary disease, unspecified: Secondary | ICD-10-CM | POA: Diagnosis not present

## 2018-06-12 DIAGNOSIS — F339 Major depressive disorder, recurrent, unspecified: Secondary | ICD-10-CM | POA: Diagnosis not present

## 2018-06-12 DIAGNOSIS — K219 Gastro-esophageal reflux disease without esophagitis: Secondary | ICD-10-CM | POA: Diagnosis not present

## 2018-06-12 DIAGNOSIS — J301 Allergic rhinitis due to pollen: Secondary | ICD-10-CM | POA: Diagnosis not present

## 2018-06-12 NOTE — Telephone Encounter (Signed)
PCC's please help with this referral. Thanks.

## 2018-06-12 NOTE — Telephone Encounter (Signed)
Pt made aware of below:  General 06/05/2018 9:41 AM O'Halloran, Vincente Liberty - -  Note   Patient is scheduled with Dr. Louanne Skye on 07/10/18.        Type Date User Summary Attachment  General 06/04/2018 4:16 PM Charlott Holler Waynard Reeds, RT Auto: Referral message -  Note   ----- Message ----- From: Shona Needles, RT Sent: 06/04/2018   4:16 PM To: Fabian Sharp Subject: Please schedule appt with Dr. Marijo Conception*           Pt states Dr. Louanne Skye apt is old and will not be keeping that apt. Needs help from our office to get scheduled in with Dr. Marijo Conception as requested. Appears referral was closed out by Ortho office.

## 2018-06-13 ENCOUNTER — Telehealth: Payer: Self-pay | Admitting: Pulmonary Disease

## 2018-06-13 DIAGNOSIS — J449 Chronic obstructive pulmonary disease, unspecified: Secondary | ICD-10-CM

## 2018-06-13 MED ORDER — BUDESONIDE 0.25 MG/2ML IN SUSP: 0.2500 mg | Freq: Two times a day (BID) | RESPIRATORY_TRACT | 3 refills | Status: DC

## 2018-06-13 MED ORDER — ARFORMOTEROL TARTRATE 15 MCG/2ML IN NEBU: 15.0000 ug | INHALATION_SOLUTION | Freq: Two times a day (BID) | RESPIRATORY_TRACT | 3 refills | Status: DC

## 2018-06-13 NOTE — Telephone Encounter (Signed)
Refills for Brovana and Pulmicort sent to [facility].  Patient notified. Nothing further at this time.

## 2018-06-16 DIAGNOSIS — I10 Essential (primary) hypertension: Secondary | ICD-10-CM | POA: Diagnosis not present

## 2018-06-16 DIAGNOSIS — J301 Allergic rhinitis due to pollen: Secondary | ICD-10-CM | POA: Diagnosis not present

## 2018-06-16 DIAGNOSIS — F339 Major depressive disorder, recurrent, unspecified: Secondary | ICD-10-CM | POA: Diagnosis not present

## 2018-06-16 DIAGNOSIS — J449 Chronic obstructive pulmonary disease, unspecified: Secondary | ICD-10-CM | POA: Diagnosis not present

## 2018-06-16 DIAGNOSIS — K219 Gastro-esophageal reflux disease without esophagitis: Secondary | ICD-10-CM | POA: Diagnosis not present

## 2018-06-16 NOTE — Telephone Encounter (Signed)
Pt has appt with Dr Ricard Dillon at ortho for  06/19/18 @ 2:15

## 2018-06-16 NOTE — Telephone Encounter (Signed)
Attempted to call pt. I did not receive an answer. I have left a message for pt to return our call.  

## 2018-06-17 NOTE — Telephone Encounter (Signed)
Spoke with pt. She is aware of her Ortho appointment. Nothing further was needed.

## 2018-06-19 ENCOUNTER — Ambulatory Visit (INDEPENDENT_AMBULATORY_CARE_PROVIDER_SITE_OTHER)

## 2018-06-19 ENCOUNTER — Encounter (INDEPENDENT_AMBULATORY_CARE_PROVIDER_SITE_OTHER): Payer: Self-pay | Admitting: Surgery

## 2018-06-19 ENCOUNTER — Ambulatory Visit (INDEPENDENT_AMBULATORY_CARE_PROVIDER_SITE_OTHER): Admitting: Surgery

## 2018-06-19 DIAGNOSIS — M542 Cervicalgia: Secondary | ICD-10-CM | POA: Diagnosis not present

## 2018-06-19 DIAGNOSIS — M4722 Other spondylosis with radiculopathy, cervical region: Secondary | ICD-10-CM | POA: Diagnosis not present

## 2018-06-19 DIAGNOSIS — M25511 Pain in right shoulder: Secondary | ICD-10-CM

## 2018-06-19 NOTE — Progress Notes (Signed)
Office Visit Note   Patient: [name]           Date of Birth: [birth year]           MRN: [id number] Visit Date: 06/19/2018              Requested by: Mosie Lukes, MD [address] [place], [place] [address] PCP: Mosie Lukes, MD   Assessment & Plan: Visit Diagnoses:  1. Acute pain of right shoulder   2. Neck pain   3. Other spondylosis with radiculopathy, cervical region     Plan: Advised patient and her husband who is present that x-ray findings indicate that she possibly has a complete rotator cuff tear with superior migration of the humeral head.  Advised patient that with her medical history including being O2 nasal cannula dependent with history of COPD this somewhat makes her situation tricky.  I will proceed with getting an MRI to better evaluate her shoulder and then have her follow-up with Dr. Alphonzo Severance for review.  There is some concern as to whether or not her other physicians would clear her for surgery but I will still plan to get the scan so we will have that information.  Patient also has multilevel cervical spondylosis with neck pain but states that this is at her normal baseline and has not worsened since her fall.  We will continue to monitor this.  Follow-Up Instructions: Return in about 2 weeks (around 07/03/2018) for With Dr. Marlou Sa review right shoulder MRI.   Orders:  Orders Placed This Encounter  Procedures  . XR Shoulder Right  . XR Cervical Spine 2 or 3 views  . MR SHOULDER RIGHT WO CONTRAST   No orders of the defined types were placed in this encounter.     Procedures: No procedures performed   Clinical Data: No additional findings.   Subjective: Chief Complaint  Patient presents with  . Right Shoulder - Pain  . Left Shoulder - Pain    HPI 77 year old white female comes in today with complaints of neck pain and right shoulder pain.  States that about 3 weeks ago she was getting out of her shower when she slipped  falling directly onto her right shoulder and back.  Since the fall she has been unable to actively abduct or flex her shoulder.  She has had chronic neck pain but this is not sniffily worsen since the injury.  She admits to having some intermittent pain numbness and tingling that goes down the left arm.  No arm weakness on the left.  Patient is O2 nasal cannula dependent and has long history of COPD Objective: Vital Signs: There were no vitals taken for this visit.  Physical Exam  Constitutional: She is oriented to person, place, and time. No distress.  HENT:  Head: Normocephalic and atraumatic.  Eyes: Pupils are equal, round, and reactive to light. EOM are normal.  Pulmonary/Chest: No respiratory distress.  Musculoskeletal:  Right shoulder positive impingement test.  Positive drop arm test.  She is unable to actively flex or abduct her right arm.  Left shoulder unremarkable.  Mild bilateral brachial plexus and trapezius tenderness.  Neurological: She is alert and oriented to person, place, and time.  Skin: Skin is warm and dry.  Psychiatric: She has a normal mood and affect.    Ortho Exam  Specialty Comments:  No specialty comments available.  Imaging: Xr Cervical Spine 2 Or 3 Views  Result Date:  06/19/2018 Multilevel cervical spondylosis with disc space collapse.  May be a couple of millimeters of C3 anterolisthesis on C4 and C4 on C5.  Xr Shoulder Right  Result Date: 06/19/2018 X-ray right shoulder shows superior migration of the humeral head with contact against the acromion.  No signs of fracture.    PMFS History: Patient Active Problem List   Diagnosis Date Noted  . Shoulder strain, right, initial encounter 06/04/2018  . Hyperlipidemia 03/19/2017  . Low back pain 04/29/2016  . Medicare annual wellness visit, subsequent 01/12/2016  . Chest pain of uncertain etiology 92/42/6834  . Leukocytosis 06/04/2015  . Clostridium difficile diarrhea 12/27/2014  . Dysuria 11/21/2014   . Encounter for quality of life palliative care 08/05/2014  . Insomnia 03/28/2014  . Chronic respiratory failure ([place]) 12/10/2013  . Hyperglycemia 12/01/2013  . COPD mixed type ([place]) 09/08/2013  . Benign neoplasm of colon 06/08/2013  . Normocytic anemia 12/28/2012  . Urinary frequency 12/28/2012  . Atrophic vaginitis 12/07/2012  . Cervical cancer screening 12/02/2012  . UTI (lower urinary tract infection) 06/07/2012  . Anemia 04/19/2012  . THRUSH 05/30/2010  . Incontinence of feces 02/28/2009  . COLONIC POLYPS, ADENOMATOUS, HX OF 02/24/2009  . ARTHRALGIA 09/21/2008  . PALLOR 07/02/2008  . Osteoporosis 06/25/2008  . WEIGHT LOSS 04/08/2008  . Hypothyroidism 10/27/2007  . Depression with anxiety 10/17/2007  . HYPERTENSION, BENIGN ESSENTIAL 07/31/2007  . GERD 07/31/2007   Past Medical History:  Diagnosis Date  . Anxiety   . Arthralgia   . Atrophic vaginitis 12/07/2012  . Chest pain   . Clostridium difficile diarrhea 12/27/2014  . COPD (chronic obstructive pulmonary disease) ([place])   . Depression   . GERD (gastroesophageal reflux disease)   . Glucose intolerance (impaired glucose tolerance)   . History of small bowel obstruction   . Hx of adenomatous colonic polyps   . Hyperglycemia 12/01/2013  . Hyperlipidemia 03/19/2017  . Hypertension   . Ischemic colitis ([place])   . Medicare annual wellness visit, subsequent 01/12/2016  . Migraine   . Nocturia 09/02/2013  . Osteoporosis   . Thyroid disease   . Weight loss     Family History  Problem Relation Age of Onset  . Alcohol abuse Mother   . Heart disease Mother   . Stroke Mother   . Hypertension Mother   . Kidney disease Mother   . COPD Mother   . Osteoporosis Mother        hip fracture  . COPD Brother   . Alcohol abuse Brother        drug abuse  . Hearing loss Brother        mvp  . Alcohol abuse Father        MVA  . Thyroid disease Daughter   . COPD Paternal Grandfather   . COPD Sister   . Hypertension Sister   .  Anxiety disorder Sister   . COPD Sister   . COPD Brother        current smoker  . Hearing loss Brother        cad  . Prostate cancer Brother   . COPD Brother   . Colon cancer Paternal Aunt   . Prostate cancer Unknown     Past Surgical History:  Procedure Laterality Date  . ABDOMINAL HYSTERECTOMY  1993  . BLADDER REPAIR    . CHOLECYSTECTOMY    . COLONOSCOPY WITH PROPOFOL N/A 06/08/2013   Procedure: COLONOSCOPY WITH PROPOFOL;  Surgeon: Lafayette Dragon, MD;  Location:  [facility];  Service: Endoscopy;  Laterality: N/A;  . DILATION AND CURETTAGE OF UTERUS     40 yrs ago  . EYE SURGERY  04/2011   bil. eyes  . TUBAL LIGATION     Social History   Occupational History  . Occupation: Retired Careers adviser: RETIRED  Tobacco Use  . Smoking status: Former Smoker    Packs/day: 2.00    Years: 40.00    Pack years: 80.00    Types: Cigarettes    Last attempt to quit: 10/29/2001    Years since quitting: 16.6  . Smokeless tobacco: Never Used  Substance and Sexual Activity  . Alcohol use: No  . Drug use: No  . Sexual activity: Never

## 2018-06-20 DIAGNOSIS — J449 Chronic obstructive pulmonary disease, unspecified: Secondary | ICD-10-CM | POA: Diagnosis not present

## 2018-06-20 DIAGNOSIS — K219 Gastro-esophageal reflux disease without esophagitis: Secondary | ICD-10-CM | POA: Diagnosis not present

## 2018-06-20 DIAGNOSIS — F339 Major depressive disorder, recurrent, unspecified: Secondary | ICD-10-CM | POA: Diagnosis not present

## 2018-06-20 DIAGNOSIS — I10 Essential (primary) hypertension: Secondary | ICD-10-CM | POA: Diagnosis not present

## 2018-06-20 DIAGNOSIS — J301 Allergic rhinitis due to pollen: Secondary | ICD-10-CM | POA: Diagnosis not present

## 2018-06-23 ENCOUNTER — Ambulatory Visit: Payer: Medicare PPO | Admitting: *Deleted

## 2018-06-23 DIAGNOSIS — J449 Chronic obstructive pulmonary disease, unspecified: Secondary | ICD-10-CM | POA: Diagnosis not present

## 2018-06-23 DIAGNOSIS — F339 Major depressive disorder, recurrent, unspecified: Secondary | ICD-10-CM | POA: Diagnosis not present

## 2018-06-23 DIAGNOSIS — J301 Allergic rhinitis due to pollen: Secondary | ICD-10-CM | POA: Diagnosis not present

## 2018-06-23 DIAGNOSIS — I10 Essential (primary) hypertension: Secondary | ICD-10-CM | POA: Diagnosis not present

## 2018-06-23 DIAGNOSIS — K219 Gastro-esophageal reflux disease without esophagitis: Secondary | ICD-10-CM | POA: Diagnosis not present

## 2018-06-25 DIAGNOSIS — J449 Chronic obstructive pulmonary disease, unspecified: Secondary | ICD-10-CM | POA: Diagnosis not present

## 2018-06-25 DIAGNOSIS — J301 Allergic rhinitis due to pollen: Secondary | ICD-10-CM | POA: Diagnosis not present

## 2018-06-25 DIAGNOSIS — I10 Essential (primary) hypertension: Secondary | ICD-10-CM | POA: Diagnosis not present

## 2018-06-25 DIAGNOSIS — F339 Major depressive disorder, recurrent, unspecified: Secondary | ICD-10-CM | POA: Diagnosis not present

## 2018-06-25 DIAGNOSIS — K219 Gastro-esophageal reflux disease without esophagitis: Secondary | ICD-10-CM | POA: Diagnosis not present

## 2018-06-26 DIAGNOSIS — J449 Chronic obstructive pulmonary disease, unspecified: Secondary | ICD-10-CM | POA: Diagnosis not present

## 2018-06-26 DIAGNOSIS — K219 Gastro-esophageal reflux disease without esophagitis: Secondary | ICD-10-CM | POA: Diagnosis not present

## 2018-06-26 DIAGNOSIS — J301 Allergic rhinitis due to pollen: Secondary | ICD-10-CM | POA: Diagnosis not present

## 2018-06-26 DIAGNOSIS — F339 Major depressive disorder, recurrent, unspecified: Secondary | ICD-10-CM | POA: Diagnosis not present

## 2018-06-26 DIAGNOSIS — I10 Essential (primary) hypertension: Secondary | ICD-10-CM | POA: Diagnosis not present

## 2018-06-29 DIAGNOSIS — K219 Gastro-esophageal reflux disease without esophagitis: Secondary | ICD-10-CM | POA: Diagnosis not present

## 2018-06-29 DIAGNOSIS — J301 Allergic rhinitis due to pollen: Secondary | ICD-10-CM | POA: Diagnosis not present

## 2018-06-29 DIAGNOSIS — J449 Chronic obstructive pulmonary disease, unspecified: Secondary | ICD-10-CM | POA: Diagnosis not present

## 2018-06-29 DIAGNOSIS — I10 Essential (primary) hypertension: Secondary | ICD-10-CM | POA: Diagnosis not present

## 2018-06-29 DIAGNOSIS — F339 Major depressive disorder, recurrent, unspecified: Secondary | ICD-10-CM | POA: Diagnosis not present

## 2018-07-01 DIAGNOSIS — J449 Chronic obstructive pulmonary disease, unspecified: Secondary | ICD-10-CM | POA: Diagnosis not present

## 2018-07-01 DIAGNOSIS — I10 Essential (primary) hypertension: Secondary | ICD-10-CM | POA: Diagnosis not present

## 2018-07-01 DIAGNOSIS — K219 Gastro-esophageal reflux disease without esophagitis: Secondary | ICD-10-CM | POA: Diagnosis not present

## 2018-07-01 DIAGNOSIS — F339 Major depressive disorder, recurrent, unspecified: Secondary | ICD-10-CM | POA: Diagnosis not present

## 2018-07-01 DIAGNOSIS — J301 Allergic rhinitis due to pollen: Secondary | ICD-10-CM | POA: Diagnosis not present

## 2018-07-02 DIAGNOSIS — K219 Gastro-esophageal reflux disease without esophagitis: Secondary | ICD-10-CM | POA: Diagnosis not present

## 2018-07-02 DIAGNOSIS — J449 Chronic obstructive pulmonary disease, unspecified: Secondary | ICD-10-CM | POA: Diagnosis not present

## 2018-07-02 DIAGNOSIS — F339 Major depressive disorder, recurrent, unspecified: Secondary | ICD-10-CM | POA: Diagnosis not present

## 2018-07-02 DIAGNOSIS — J301 Allergic rhinitis due to pollen: Secondary | ICD-10-CM | POA: Diagnosis not present

## 2018-07-02 DIAGNOSIS — I10 Essential (primary) hypertension: Secondary | ICD-10-CM | POA: Diagnosis not present

## 2018-07-03 DIAGNOSIS — K219 Gastro-esophageal reflux disease without esophagitis: Secondary | ICD-10-CM | POA: Diagnosis not present

## 2018-07-03 DIAGNOSIS — J301 Allergic rhinitis due to pollen: Secondary | ICD-10-CM | POA: Diagnosis not present

## 2018-07-03 DIAGNOSIS — J449 Chronic obstructive pulmonary disease, unspecified: Secondary | ICD-10-CM | POA: Diagnosis not present

## 2018-07-03 DIAGNOSIS — F339 Major depressive disorder, recurrent, unspecified: Secondary | ICD-10-CM | POA: Diagnosis not present

## 2018-07-03 DIAGNOSIS — I10 Essential (primary) hypertension: Secondary | ICD-10-CM | POA: Diagnosis not present

## 2018-07-04 DIAGNOSIS — I10 Essential (primary) hypertension: Secondary | ICD-10-CM | POA: Diagnosis not present

## 2018-07-04 DIAGNOSIS — K219 Gastro-esophageal reflux disease without esophagitis: Secondary | ICD-10-CM | POA: Diagnosis not present

## 2018-07-04 DIAGNOSIS — F339 Major depressive disorder, recurrent, unspecified: Secondary | ICD-10-CM | POA: Diagnosis not present

## 2018-07-04 DIAGNOSIS — J449 Chronic obstructive pulmonary disease, unspecified: Secondary | ICD-10-CM | POA: Diagnosis not present

## 2018-07-04 DIAGNOSIS — J301 Allergic rhinitis due to pollen: Secondary | ICD-10-CM | POA: Diagnosis not present

## 2018-07-07 DIAGNOSIS — J449 Chronic obstructive pulmonary disease, unspecified: Secondary | ICD-10-CM | POA: Diagnosis not present

## 2018-07-07 DIAGNOSIS — F339 Major depressive disorder, recurrent, unspecified: Secondary | ICD-10-CM | POA: Diagnosis not present

## 2018-07-07 DIAGNOSIS — K219 Gastro-esophageal reflux disease without esophagitis: Secondary | ICD-10-CM | POA: Diagnosis not present

## 2018-07-07 DIAGNOSIS — J301 Allergic rhinitis due to pollen: Secondary | ICD-10-CM | POA: Diagnosis not present

## 2018-07-07 DIAGNOSIS — I10 Essential (primary) hypertension: Secondary | ICD-10-CM | POA: Diagnosis not present

## 2018-07-10 ENCOUNTER — Ambulatory Visit (INDEPENDENT_AMBULATORY_CARE_PROVIDER_SITE_OTHER): Payer: Self-pay | Admitting: Specialist

## 2018-07-10 DIAGNOSIS — J301 Allergic rhinitis due to pollen: Secondary | ICD-10-CM | POA: Diagnosis not present

## 2018-07-10 DIAGNOSIS — K219 Gastro-esophageal reflux disease without esophagitis: Secondary | ICD-10-CM | POA: Diagnosis not present

## 2018-07-10 DIAGNOSIS — J449 Chronic obstructive pulmonary disease, unspecified: Secondary | ICD-10-CM | POA: Diagnosis not present

## 2018-07-10 DIAGNOSIS — F339 Major depressive disorder, recurrent, unspecified: Secondary | ICD-10-CM | POA: Diagnosis not present

## 2018-07-10 DIAGNOSIS — I10 Essential (primary) hypertension: Secondary | ICD-10-CM | POA: Diagnosis not present

## 2018-07-11 DIAGNOSIS — F339 Major depressive disorder, recurrent, unspecified: Secondary | ICD-10-CM | POA: Diagnosis not present

## 2018-07-11 DIAGNOSIS — J301 Allergic rhinitis due to pollen: Secondary | ICD-10-CM | POA: Diagnosis not present

## 2018-07-11 DIAGNOSIS — K219 Gastro-esophageal reflux disease without esophagitis: Secondary | ICD-10-CM | POA: Diagnosis not present

## 2018-07-11 DIAGNOSIS — J449 Chronic obstructive pulmonary disease, unspecified: Secondary | ICD-10-CM | POA: Diagnosis not present

## 2018-07-11 DIAGNOSIS — I10 Essential (primary) hypertension: Secondary | ICD-10-CM | POA: Diagnosis not present

## 2018-07-14 ENCOUNTER — Telehealth: Payer: Self-pay | Admitting: Pulmonary Disease

## 2018-07-14 DIAGNOSIS — J301 Allergic rhinitis due to pollen: Secondary | ICD-10-CM | POA: Diagnosis not present

## 2018-07-14 DIAGNOSIS — K219 Gastro-esophageal reflux disease without esophagitis: Secondary | ICD-10-CM | POA: Diagnosis not present

## 2018-07-14 DIAGNOSIS — F339 Major depressive disorder, recurrent, unspecified: Secondary | ICD-10-CM | POA: Diagnosis not present

## 2018-07-14 DIAGNOSIS — J449 Chronic obstructive pulmonary disease, unspecified: Secondary | ICD-10-CM | POA: Diagnosis not present

## 2018-07-14 DIAGNOSIS — I10 Essential (primary) hypertension: Secondary | ICD-10-CM | POA: Diagnosis not present

## 2018-07-14 NOTE — Telephone Encounter (Signed)
Noted routed to RA as FYI.

## 2018-07-16 ENCOUNTER — Other Ambulatory Visit: Payer: Self-pay | Admitting: Pulmonary Disease

## 2018-07-17 ENCOUNTER — Ambulatory Visit (INDEPENDENT_AMBULATORY_CARE_PROVIDER_SITE_OTHER): Payer: Self-pay | Admitting: Surgery

## 2018-07-17 DIAGNOSIS — K219 Gastro-esophageal reflux disease without esophagitis: Secondary | ICD-10-CM | POA: Diagnosis not present

## 2018-07-17 DIAGNOSIS — J449 Chronic obstructive pulmonary disease, unspecified: Secondary | ICD-10-CM | POA: Diagnosis not present

## 2018-07-17 DIAGNOSIS — I10 Essential (primary) hypertension: Secondary | ICD-10-CM | POA: Diagnosis not present

## 2018-07-17 DIAGNOSIS — F339 Major depressive disorder, recurrent, unspecified: Secondary | ICD-10-CM | POA: Diagnosis not present

## 2018-07-17 DIAGNOSIS — J301 Allergic rhinitis due to pollen: Secondary | ICD-10-CM | POA: Diagnosis not present

## 2018-07-18 DIAGNOSIS — I10 Essential (primary) hypertension: Secondary | ICD-10-CM | POA: Diagnosis not present

## 2018-07-18 DIAGNOSIS — F339 Major depressive disorder, recurrent, unspecified: Secondary | ICD-10-CM | POA: Diagnosis not present

## 2018-07-18 DIAGNOSIS — K219 Gastro-esophageal reflux disease without esophagitis: Secondary | ICD-10-CM | POA: Diagnosis not present

## 2018-07-18 DIAGNOSIS — J449 Chronic obstructive pulmonary disease, unspecified: Secondary | ICD-10-CM | POA: Diagnosis not present

## 2018-07-18 DIAGNOSIS — J301 Allergic rhinitis due to pollen: Secondary | ICD-10-CM | POA: Diagnosis not present

## 2018-07-21 DIAGNOSIS — J449 Chronic obstructive pulmonary disease, unspecified: Secondary | ICD-10-CM | POA: Diagnosis not present

## 2018-07-21 DIAGNOSIS — K219 Gastro-esophageal reflux disease without esophagitis: Secondary | ICD-10-CM | POA: Diagnosis not present

## 2018-07-21 DIAGNOSIS — J301 Allergic rhinitis due to pollen: Secondary | ICD-10-CM | POA: Diagnosis not present

## 2018-07-21 DIAGNOSIS — I10 Essential (primary) hypertension: Secondary | ICD-10-CM | POA: Diagnosis not present

## 2018-07-21 DIAGNOSIS — F339 Major depressive disorder, recurrent, unspecified: Secondary | ICD-10-CM | POA: Diagnosis not present

## 2018-07-22 ENCOUNTER — Other Ambulatory Visit: Payer: Self-pay | Admitting: Family Medicine

## 2018-07-22 DIAGNOSIS — J449 Chronic obstructive pulmonary disease, unspecified: Secondary | ICD-10-CM | POA: Diagnosis not present

## 2018-07-22 DIAGNOSIS — J301 Allergic rhinitis due to pollen: Secondary | ICD-10-CM | POA: Diagnosis not present

## 2018-07-22 DIAGNOSIS — I10 Essential (primary) hypertension: Secondary | ICD-10-CM | POA: Diagnosis not present

## 2018-07-22 DIAGNOSIS — K219 Gastro-esophageal reflux disease without esophagitis: Secondary | ICD-10-CM | POA: Diagnosis not present

## 2018-07-22 DIAGNOSIS — F339 Major depressive disorder, recurrent, unspecified: Secondary | ICD-10-CM | POA: Diagnosis not present

## 2018-07-24 DIAGNOSIS — J449 Chronic obstructive pulmonary disease, unspecified: Secondary | ICD-10-CM | POA: Diagnosis not present

## 2018-07-24 DIAGNOSIS — F339 Major depressive disorder, recurrent, unspecified: Secondary | ICD-10-CM | POA: Diagnosis not present

## 2018-07-24 DIAGNOSIS — I10 Essential (primary) hypertension: Secondary | ICD-10-CM | POA: Diagnosis not present

## 2018-07-24 DIAGNOSIS — K219 Gastro-esophageal reflux disease without esophagitis: Secondary | ICD-10-CM | POA: Diagnosis not present

## 2018-07-24 DIAGNOSIS — J301 Allergic rhinitis due to pollen: Secondary | ICD-10-CM | POA: Diagnosis not present

## 2018-07-25 DIAGNOSIS — J301 Allergic rhinitis due to pollen: Secondary | ICD-10-CM | POA: Diagnosis not present

## 2018-07-25 DIAGNOSIS — J449 Chronic obstructive pulmonary disease, unspecified: Secondary | ICD-10-CM | POA: Diagnosis not present

## 2018-07-25 DIAGNOSIS — I10 Essential (primary) hypertension: Secondary | ICD-10-CM | POA: Diagnosis not present

## 2018-07-25 DIAGNOSIS — F339 Major depressive disorder, recurrent, unspecified: Secondary | ICD-10-CM | POA: Diagnosis not present

## 2018-07-25 DIAGNOSIS — K219 Gastro-esophageal reflux disease without esophagitis: Secondary | ICD-10-CM | POA: Diagnosis not present

## 2018-07-28 ENCOUNTER — Ambulatory Visit (INDEPENDENT_AMBULATORY_CARE_PROVIDER_SITE_OTHER): Payer: Medicare Other | Admitting: Sports Medicine

## 2018-07-28 ENCOUNTER — Ambulatory Visit: Payer: Self-pay

## 2018-07-28 ENCOUNTER — Encounter: Payer: Self-pay | Admitting: Sports Medicine

## 2018-07-28 VITALS — BP 104/70 | HR 79 | Ht 61.0 in | Wt 91.2 lb

## 2018-07-28 DIAGNOSIS — G8929 Other chronic pain: Secondary | ICD-10-CM

## 2018-07-28 DIAGNOSIS — M25511 Pain in right shoulder: Secondary | ICD-10-CM

## 2018-07-28 DIAGNOSIS — Z23 Encounter for immunization: Secondary | ICD-10-CM

## 2018-07-28 NOTE — Progress Notes (Signed)
PROCEDURE NOTE: THERAPEUTIC EXERCISES (97110) 15 minutes spent for Therapeutic exercises as below and as referenced in the AVS.  This included exercises focusing on stretching, strengthening, with significant focus on eccentric aspects.   Proper technique shown and discussed handout in great detail with myself and/or ATC.  All questions were discussed and answered.   Long term goals include an improvement in range of motion, strength, endurance as well as avoiding reinjury. Frequency of visits is one time as determined during today's  office visit. Frequency of exercises to be performed is as per handout.  EXERCISES REVIEWED:  Codman's  ROM

## 2018-07-28 NOTE — Patient Instructions (Addendum)

## 2018-07-28 NOTE — Procedures (Signed)
PROCEDURE NOTE:  Ultrasound Guided: Injection: Right shoulder Images were obtained and interpreted by myself, Teresa Coombs, DO  Images have been saved and stored to PACS system. Images obtained on: GE S7 Ultrasound machine    ULTRASOUND FINDINGS:  Biceps Tendon: Normal Pec Major Insertion: Normal Subscapularis Tendon: Normal Supraspinatus Tendon: dimunitive but no overt tearing Infraspinatus/Teres Minor Tendon: Normal AC Joint: mild deg spurring JOINT: moderate spurring, with minimal effusion LABRUM: Not evaluated  DESCRIPTION OF PROCEDURE:  The patient's clinical condition is marked by substantial pain and/or significant functional disability. Other conservative therapy has not provided relief, is contraindicated, or not appropriate. There is a reasonable likelihood that injection will significantly improve the patient's pain and/or functional impairment.   After discussing the risks, benefits and expected outcomes of the injection and all questions were reviewed and answered, the patient wished to undergo the above named procedure.  Verbal consent was obtained.  The ultrasound was used to identify the target structure and adjacent neurovascular structures. The skin was then prepped in sterile fashion and the target structure was injected under direct visualization using sterile technique as below:  Single injection performed as below: PREP: Alcohol and Ethel Chloride APPROACH:posterior, single injection, 21g 2 in. INJECTATE: 2 cc 0.5% Marcaine and 2 cc 40mg /mL DepoMedrol ASPIRATE: None DRESSING: Band-Aid  Post procedural instructions including recommending icing and warning signs for infection were reviewed.    This procedure was well tolerated and there were no complications.   IMPRESSION: Succesful Ultrasound Guided: Injection

## 2018-07-28 NOTE — Progress Notes (Signed)
Juanda Bond. Bodin Gorka, [place] at [facility] [place] - 77 y.o. female MRN [id number]  Date of birth: 06-Sep-1941  Visit Date: 07/28/2018  PCP: Mosie Lukes, MD   Referred by: Mosie Lukes, MD   Scribe(s) for today's visit: Wendy Poet, LAT, ATC  SUBJECTIVE:  [name] is here for New Patient (Initial Visit) (R shoulder pain)     HPI: Her R shoulder pain symptoms INITIALLY: Began about 2 months ago when she fell off the edge of her bathtub and landed directly on her R shoulder.  She's been to see Vira Browns, PA at [facility] and R shoulder and neck XRs were obtained. Described as mild, nonradiating.  Biggest c/o is her inability to move her R shoulder, not so much pain. Worsened with R arm and shoulder  AROM Improved with nothing noted Additional associated symptoms include: lack of R shoulder ROM; no R shoulder mechanical symptoms noted; no N/T in R UE.    At this time symptoms show no change compared to onset  She has been taking Tramadol.  Benjiman Core, PA ordered an MRI of her R shoulder on 06/19/18 but not completed at this point.  C-spine and R shoulder XR - 06/19/18  REVIEW OF SYSTEMS: Denies night time disturbances. Reports fevers, chills, or night sweats.  Night sweats Reports unexplained weight loss. Denies personal history of cancer. Denies changes in bowel or bladder habits. Reports recent unreported falls. Reports new or worsening dyspnea or wheezing. Denies headaches or dizziness.  Reports numbness, tingling or weakness  In the extremities - weakness in R arm Denies dizziness or presyncopal episodes Denies lower extremity edema    HISTORY:  Prior history reviewed and updated per electronic medical record.  Social History   Occupational History  . Occupation: Retired Careers adviser: RETIRED  Tobacco Use  . Smoking status: Former Smoker    Packs/day: 2.00    Years: 40.00    Pack years: 80.00    Types: Cigarettes    Last attempt to quit: 10/29/2001    Years since quitting: 16.8  . Smokeless tobacco: Never Used  Substance and Sexual Activity  . Alcohol use: No  . Drug use: No  . Sexual activity: Never   Social History   Social History Narrative  . Not on file    Past Medical History:  Diagnosis Date  . Anxiety   . Arthralgia   . Atrophic vaginitis 12/07/2012  . Chest pain   . Clostridium difficile diarrhea 12/27/2014  . COPD (chronic obstructive pulmonary disease) ([place])   . Depression   . GERD (gastroesophageal reflux disease)   . Glucose intolerance (impaired glucose tolerance)   . History of small bowel obstruction   . Hx of adenomatous colonic polyps   . Hyperglycemia 12/01/2013  . Hyperlipidemia 03/19/2017  . Hypertension   . Ischemic colitis ([place])   . Medicare annual wellness visit, subsequent 01/12/2016  . Migraine   . Nocturia 09/02/2013  . Osteoporosis   . Thyroid disease   . Weight loss    Past Surgical History:  Procedure Laterality Date  . ABDOMINAL HYSTERECTOMY  1993  . BLADDER REPAIR    . CHOLECYSTECTOMY    . COLONOSCOPY WITH PROPOFOL N/A 06/08/2013   Procedure: COLONOSCOPY WITH PROPOFOL;  Surgeon: Lafayette Dragon, MD;  Location: [facility];  Service: Endoscopy;  Laterality: N/A;  . DILATION AND CURETTAGE OF UTERUS  40 yrs ago  . EYE SURGERY  04/2011   bil. eyes  . TUBAL LIGATION     family history includes Alcohol abuse in her brother, father, and mother; Anxiety disorder in her sister; COPD in her brother, brother, brother, mother, paternal grandfather, sister, and sister; Colon cancer in her paternal aunt; Hearing loss in her brother and brother; Heart disease in her mother; Hypertension in her mother and sister; Kidney disease in her mother; Osteoporosis in her mother; Prostate cancer in her brother and unknown relative; Stroke in her mother; Thyroid disease in her daughter.  DATA OBTAINED & REVIEWED:  No  results for input(s): HGBA1C, LABURIC, CREATINE in the last 8760 hours. No problems updated. . 06/19/2018: o X-rays of the cervical spine right shoulder show high riding humeral head without evidence of significant osteoarthritic change.   o Cervical spine x-rays that showed mild cervical spondylosis without significant change. .   OBJECTIVE:  VS:  HT:5\' 1"  (154.9 cm)   WT:91 lb 3.2 oz (41.4 kg)  BMI:17.24    BP:104/70  HR:79bpm  TEMP: ( )  RESP:99 %   PHYSICAL EXAM: CONSTITUTIONAL: Well-developed, Well-nourished and In no acute distress PSYCHIATRIC: Alert & appropriately interactive. and Not depressed or anxious appearing. RESPIRATORY: No increased work of breathing and Trachea Midline EYES: Pupils are equal., EOM intact without nystagmus. and No scleral icterus.  VASCULAR EXAM: Warm and well perfused NEURO: unremarkable  MSK Exam: Right shoulder  Well aligned, no significant deformity. No overlying skin changes. TTP over Generalized tenderness.   RANGE OF MOTION & STRENGTH  Limited and painful: Overhead range of motion with limited abduction, internal rotation and external rotation markedly worse on the right greater than left.   SPECIALITY TESTING:  Intrinsic rotator cuff strength is diminished with empty can testing with moderate pain.  Some pain with active and circumduction  Moderate pain with speeds testing and O'Brien's testing.  Negative Hawkins.     ASSESSMENT   1. Chronic right shoulder pain   2. Need for influenza vaccination     PLAN:  Pertinent additional documentation may be included in corresponding procedure notes, imaging studies, problem based documentation and patient instructions.  Procedures:  . US Guided Injection per procedure note  Medications:  No orders of the defined types were placed in this encounter.  Discussion/Instructions: No problem-specific Assessment & Plan notes found for this encounter.  Marland Kitchen Ultimately she is not interested  in surgery at this time so further diagnostic evaluation will be deferred . She would like to hold off on formal physical therapy with home exercise program recommended at this time provided today per HEP. . She does understand if this does represent a rotator cuff tear, joint surgery could result in complete healing . Discussed red flag symptoms that warrant earlier emergent evaluation and patient voices understanding. . Activity modifications and the importance of avoiding exacerbating activities (limiting pain to no more than a 4 / 10 during or following activity) recommended and discussed.  Follow-up:  . Return in about 4 weeks (around 08/25/2018) for consideration of repeat injections.  . If any lack of improvement consider: Biologic Therapy with Nitro-Glycerin Protocol . At follow up will plan to consider: repeat corticosteroid injections     CMA/ATC served as scribe during this visit. History, Physical, and Plan performed by medical provider. Documentation and orders reviewed and attested to.      Gerda Diss, [facility] Physician

## 2018-07-29 DIAGNOSIS — K219 Gastro-esophageal reflux disease without esophagitis: Secondary | ICD-10-CM | POA: Diagnosis not present

## 2018-07-29 DIAGNOSIS — J301 Allergic rhinitis due to pollen: Secondary | ICD-10-CM | POA: Diagnosis not present

## 2018-07-29 DIAGNOSIS — F339 Major depressive disorder, recurrent, unspecified: Secondary | ICD-10-CM | POA: Diagnosis not present

## 2018-07-29 DIAGNOSIS — I10 Essential (primary) hypertension: Secondary | ICD-10-CM | POA: Diagnosis not present

## 2018-07-29 DIAGNOSIS — J449 Chronic obstructive pulmonary disease, unspecified: Secondary | ICD-10-CM | POA: Diagnosis not present

## 2018-07-30 DIAGNOSIS — F339 Major depressive disorder, recurrent, unspecified: Secondary | ICD-10-CM | POA: Diagnosis not present

## 2018-07-30 DIAGNOSIS — I10 Essential (primary) hypertension: Secondary | ICD-10-CM | POA: Diagnosis not present

## 2018-07-30 DIAGNOSIS — J449 Chronic obstructive pulmonary disease, unspecified: Secondary | ICD-10-CM | POA: Diagnosis not present

## 2018-07-30 DIAGNOSIS — J301 Allergic rhinitis due to pollen: Secondary | ICD-10-CM | POA: Diagnosis not present

## 2018-07-30 DIAGNOSIS — K219 Gastro-esophageal reflux disease without esophagitis: Secondary | ICD-10-CM | POA: Diagnosis not present

## 2018-07-31 DIAGNOSIS — F339 Major depressive disorder, recurrent, unspecified: Secondary | ICD-10-CM | POA: Diagnosis not present

## 2018-07-31 DIAGNOSIS — J301 Allergic rhinitis due to pollen: Secondary | ICD-10-CM | POA: Diagnosis not present

## 2018-07-31 DIAGNOSIS — I10 Essential (primary) hypertension: Secondary | ICD-10-CM | POA: Diagnosis not present

## 2018-07-31 DIAGNOSIS — K219 Gastro-esophageal reflux disease without esophagitis: Secondary | ICD-10-CM | POA: Diagnosis not present

## 2018-07-31 DIAGNOSIS — J449 Chronic obstructive pulmonary disease, unspecified: Secondary | ICD-10-CM | POA: Diagnosis not present

## 2018-08-01 DIAGNOSIS — J449 Chronic obstructive pulmonary disease, unspecified: Secondary | ICD-10-CM | POA: Diagnosis not present

## 2018-08-01 DIAGNOSIS — J301 Allergic rhinitis due to pollen: Secondary | ICD-10-CM | POA: Diagnosis not present

## 2018-08-01 DIAGNOSIS — K219 Gastro-esophageal reflux disease without esophagitis: Secondary | ICD-10-CM | POA: Diagnosis not present

## 2018-08-01 DIAGNOSIS — F339 Major depressive disorder, recurrent, unspecified: Secondary | ICD-10-CM | POA: Diagnosis not present

## 2018-08-01 DIAGNOSIS — I10 Essential (primary) hypertension: Secondary | ICD-10-CM | POA: Diagnosis not present

## 2018-08-04 DIAGNOSIS — F339 Major depressive disorder, recurrent, unspecified: Secondary | ICD-10-CM | POA: Diagnosis not present

## 2018-08-04 DIAGNOSIS — J449 Chronic obstructive pulmonary disease, unspecified: Secondary | ICD-10-CM | POA: Diagnosis not present

## 2018-08-04 DIAGNOSIS — I10 Essential (primary) hypertension: Secondary | ICD-10-CM | POA: Diagnosis not present

## 2018-08-04 DIAGNOSIS — J301 Allergic rhinitis due to pollen: Secondary | ICD-10-CM | POA: Diagnosis not present

## 2018-08-04 DIAGNOSIS — K219 Gastro-esophageal reflux disease without esophagitis: Secondary | ICD-10-CM | POA: Diagnosis not present

## 2018-08-07 DIAGNOSIS — J449 Chronic obstructive pulmonary disease, unspecified: Secondary | ICD-10-CM | POA: Diagnosis not present

## 2018-08-07 DIAGNOSIS — I10 Essential (primary) hypertension: Secondary | ICD-10-CM | POA: Diagnosis not present

## 2018-08-07 DIAGNOSIS — F339 Major depressive disorder, recurrent, unspecified: Secondary | ICD-10-CM | POA: Diagnosis not present

## 2018-08-07 DIAGNOSIS — J301 Allergic rhinitis due to pollen: Secondary | ICD-10-CM | POA: Diagnosis not present

## 2018-08-07 DIAGNOSIS — K219 Gastro-esophageal reflux disease without esophagitis: Secondary | ICD-10-CM | POA: Diagnosis not present

## 2018-08-08 ENCOUNTER — Telehealth: Payer: Self-pay | Admitting: *Deleted

## 2018-08-08 NOTE — Telephone Encounter (Signed)
Received Physician Orders from [place]; forwarded to provider/SLS 10/11

## 2018-08-11 DIAGNOSIS — J301 Allergic rhinitis due to pollen: Secondary | ICD-10-CM | POA: Diagnosis not present

## 2018-08-11 DIAGNOSIS — K219 Gastro-esophageal reflux disease without esophagitis: Secondary | ICD-10-CM | POA: Diagnosis not present

## 2018-08-11 DIAGNOSIS — J449 Chronic obstructive pulmonary disease, unspecified: Secondary | ICD-10-CM | POA: Diagnosis not present

## 2018-08-11 DIAGNOSIS — F339 Major depressive disorder, recurrent, unspecified: Secondary | ICD-10-CM | POA: Diagnosis not present

## 2018-08-11 DIAGNOSIS — I10 Essential (primary) hypertension: Secondary | ICD-10-CM | POA: Diagnosis not present

## 2018-08-12 NOTE — Patient Instructions (Addendum)

## 2018-08-14 DIAGNOSIS — J301 Allergic rhinitis due to pollen: Secondary | ICD-10-CM | POA: Diagnosis not present

## 2018-08-14 DIAGNOSIS — K219 Gastro-esophageal reflux disease without esophagitis: Secondary | ICD-10-CM | POA: Diagnosis not present

## 2018-08-14 DIAGNOSIS — J449 Chronic obstructive pulmonary disease, unspecified: Secondary | ICD-10-CM | POA: Diagnosis not present

## 2018-08-14 DIAGNOSIS — F339 Major depressive disorder, recurrent, unspecified: Secondary | ICD-10-CM | POA: Diagnosis not present

## 2018-08-14 DIAGNOSIS — I10 Essential (primary) hypertension: Secondary | ICD-10-CM | POA: Diagnosis not present

## 2018-08-18 DIAGNOSIS — J449 Chronic obstructive pulmonary disease, unspecified: Secondary | ICD-10-CM | POA: Diagnosis not present

## 2018-08-18 DIAGNOSIS — J301 Allergic rhinitis due to pollen: Secondary | ICD-10-CM | POA: Diagnosis not present

## 2018-08-18 DIAGNOSIS — K219 Gastro-esophageal reflux disease without esophagitis: Secondary | ICD-10-CM | POA: Diagnosis not present

## 2018-08-18 DIAGNOSIS — I10 Essential (primary) hypertension: Secondary | ICD-10-CM | POA: Diagnosis not present

## 2018-08-18 DIAGNOSIS — F339 Major depressive disorder, recurrent, unspecified: Secondary | ICD-10-CM | POA: Diagnosis not present

## 2018-08-21 DIAGNOSIS — K219 Gastro-esophageal reflux disease without esophagitis: Secondary | ICD-10-CM | POA: Diagnosis not present

## 2018-08-21 DIAGNOSIS — F339 Major depressive disorder, recurrent, unspecified: Secondary | ICD-10-CM | POA: Diagnosis not present

## 2018-08-21 DIAGNOSIS — J301 Allergic rhinitis due to pollen: Secondary | ICD-10-CM | POA: Diagnosis not present

## 2018-08-21 DIAGNOSIS — I10 Essential (primary) hypertension: Secondary | ICD-10-CM | POA: Diagnosis not present

## 2018-08-21 DIAGNOSIS — J449 Chronic obstructive pulmonary disease, unspecified: Secondary | ICD-10-CM | POA: Diagnosis not present

## 2018-08-22 DIAGNOSIS — J301 Allergic rhinitis due to pollen: Secondary | ICD-10-CM | POA: Diagnosis not present

## 2018-08-22 DIAGNOSIS — F339 Major depressive disorder, recurrent, unspecified: Secondary | ICD-10-CM | POA: Diagnosis not present

## 2018-08-22 DIAGNOSIS — K219 Gastro-esophageal reflux disease without esophagitis: Secondary | ICD-10-CM | POA: Diagnosis not present

## 2018-08-22 DIAGNOSIS — I10 Essential (primary) hypertension: Secondary | ICD-10-CM | POA: Diagnosis not present

## 2018-08-22 DIAGNOSIS — J449 Chronic obstructive pulmonary disease, unspecified: Secondary | ICD-10-CM | POA: Diagnosis not present

## 2018-08-25 ENCOUNTER — Ambulatory Visit (INDEPENDENT_AMBULATORY_CARE_PROVIDER_SITE_OTHER): Admitting: Sports Medicine

## 2018-08-25 ENCOUNTER — Ambulatory Visit: Payer: Self-pay

## 2018-08-25 ENCOUNTER — Encounter: Payer: Self-pay | Admitting: Sports Medicine

## 2018-08-25 VITALS — BP 106/66 | HR 94 | Ht 61.0 in | Wt 88.8 lb

## 2018-08-25 DIAGNOSIS — I10 Essential (primary) hypertension: Secondary | ICD-10-CM | POA: Diagnosis not present

## 2018-08-25 DIAGNOSIS — G8929 Other chronic pain: Secondary | ICD-10-CM | POA: Diagnosis not present

## 2018-08-25 DIAGNOSIS — F339 Major depressive disorder, recurrent, unspecified: Secondary | ICD-10-CM | POA: Diagnosis not present

## 2018-08-25 DIAGNOSIS — K219 Gastro-esophageal reflux disease without esophagitis: Secondary | ICD-10-CM | POA: Diagnosis not present

## 2018-08-25 DIAGNOSIS — M25511 Pain in right shoulder: Secondary | ICD-10-CM | POA: Diagnosis not present

## 2018-08-25 DIAGNOSIS — J449 Chronic obstructive pulmonary disease, unspecified: Secondary | ICD-10-CM | POA: Diagnosis not present

## 2018-08-25 DIAGNOSIS — J301 Allergic rhinitis due to pollen: Secondary | ICD-10-CM | POA: Diagnosis not present

## 2018-08-25 NOTE — Progress Notes (Signed)
[name]. [name], [place] at [facility] - 77 y.o. female MRN [id number]  Date of birth: 1941-03-22  Visit Date: 08/25/2018  PCP: Mosie Lukes, MD   Referred by: Mosie Lukes, MD   Scribe(s) for today's visit: Wendy Poet, LAT, ATC  SUBJECTIVE:  [name] is here for Follow-up (Chronic R shoulder pain)    HPI: Her R shoulder pain symptoms INITIALLY: Began about 2 months ago when she fell off the edge of her bathtub and landed directly on her R shoulder.  She's been to see Vira Browns, PA at [facility] and R shoulder and neck XRs were obtained. Described as mild, nonradiating.  Biggest c/o is her inability to move her R shoulder, not so much pain. Worsened with R arm and shoulder  AROM Improved with nothing noted Additional associated symptoms include: lack of R shoulder ROM; no R shoulder mechanical symptoms noted; no N/T in R UE.    At this time symptoms show no change compared to onset  She has been taking Tramadol.  Benjiman Core, PA ordered an MRI of her R shoulder on 06/19/18 but not completed at this point.  C-spine and R shoulder XR - 06/19/18  08/25/2018: Compared to the last office visit on 07/28/18, her previously described R shoulder symptoms show no change.  Pt states that the injection in her R shoulder helped for a few weeks in terms of decreasing pain and improving ROM but that has stopped helping.  She reports that she fell about 2 weeks ago and landed on R side which is likely contributing to her decreased improvement. Current symptoms are mild aching pain & are nonradiating.  Biggest issue remains limited R shoulder ROM She has been taking Tramadol and doing her HEP.  She also had a steroid injection into her R shoulder at her last visit.  REVIEW OF SYSTEMS: Denies night time disturbances. Reports fevers, chills, or night sweats.  Night sweats Reports unexplained weight loss. Denies  personal history of cancer. Denies changes in bowel or bladder habits. Reports recent unreported falls.  Pt fell most recently 2 weeks ago. Reports new or worsening dyspnea or wheezing. Denies headaches or dizziness.  Reports numbness, tingling or weakness  In the extremities - weakness in R arm Denies dizziness or presyncopal episodes Denies lower extremity edema.   HISTORY:  Prior history reviewed and updated per electronic medical record.  Social History   Occupational History  . Occupation: Retired Careers adviser: RETIRED  Tobacco Use  . Smoking status: Former Smoker    Packs/day: 2.00    Years: 40.00    Pack years: 80.00    Types: Cigarettes    Last attempt to quit: 10/29/2001    Years since quitting: 17.0  . Smokeless tobacco: Never Used  Substance and Sexual Activity  . Alcohol use: No  . Drug use: No  . Sexual activity: Never   Social History   Social History Narrative  . Not on file    DATA OBTAINED & REVIEWED:  No results for input(s): HGBA1C, LABURIC, CREATINE, CALCIUM, AST, ALT, TSH in the last 8760 hours.  Invalid input(s): MAGNESIUM, CK No problems updated. No specialty comments available.   OBJECTIVE:  VS:  HT:5\' 1"  (154.9 cm)   WT:88 lb 12.8 oz (40.3 kg)  BMI:16.79    BP:106/66  HR:94bpm  TEMP: ( )  RESP:98 %   PHYSICAL  EXAM: Adult female.  In no acute respiratory distress.  She is alert and appropriate.  She has significant guarding of her right shoulder. Intrinsic rotator cuff strength is intact however she has pain with cross body testing, Hawkins and Neer's.    ASSESSMENT   1. Chronic right shoulder pain     PROCEDURES:  US Guided Injection per procedure note      PLAN:  Pertinent additional documentation may be included in corresponding procedure notes, imaging studies, problem based documentation and patient instructions. Symptoms are consistent with subacromial bursitis.  Should respond well to  injection and therapeutic exercises.  Referral placed to PT.   Activity modifications and the importance of avoiding exacerbating activities (limiting pain to no more than a 4 / 10 during or following activity) recommended and discussed. Discussed red flag symptoms that warrant earlier emergent evaluation and patient voices understanding. No orders of the defined types were placed in this encounter.  Lab Orders  No laboratory test(s) ordered today   Imaging Orders     [place] [facility] POCT ULTRASOUND Referral Orders     Ambulatory referral to Physical Therapy   Return in about 4 weeks (around 09/22/2018).     CMA/ATC served as Education administrator during this visit. History, Physical, and Plan performed by medical provider. Documentation and orders reviewed and attested to.      Gerda Diss, [facility] Physician

## 2018-08-25 NOTE — Procedures (Signed)
PROCEDURE NOTE:  Ultrasound Guided: Injection: Right shoulder, Subacromial Images were obtained and interpreted by myself, Teresa Coombs, DO  Images have been saved and stored to PACS system. Images obtained on: GE S7 Ultrasound machine    ULTRASOUND FINDINGS:  Small subacromial bursa.  Diminutive appearing rotator cuff.  DESCRIPTION OF PROCEDURE:  The patient's clinical condition is marked by substantial pain and/or significant functional disability. Other conservative therapy has not provided relief, is contraindicated, or not appropriate. There is a reasonable likelihood that injection will significantly improve the patient's pain and/or functional impairment.   After discussing the risks, benefits and expected outcomes of the injection and all questions were reviewed and answered, the patient wished to undergo the above named procedure.  Verbal consent was obtained.  The ultrasound was used to identify the target structure and adjacent neurovascular structures. The skin was then prepped in sterile fashion and the target structure was injected under direct visualization using sterile technique as below:  Single injection performed as below: PREP: Alcohol and Ethel Chloride APPROACH:direct, single injection, 25g 1.5 in. INJECTATE: 2 cc 0.5% Marcaine and 2 cc 40mg /mL DepoMedrol ASPIRATE: None DRESSING: Band-Aid  Post procedural instructions including recommending icing and warning signs for infection were reviewed.    This procedure was well tolerated and there were no complications.   IMPRESSION: Succesful Ultrasound Guided: Injection

## 2018-08-26 DIAGNOSIS — J301 Allergic rhinitis due to pollen: Secondary | ICD-10-CM | POA: Diagnosis not present

## 2018-08-26 DIAGNOSIS — I10 Essential (primary) hypertension: Secondary | ICD-10-CM | POA: Diagnosis not present

## 2018-08-26 DIAGNOSIS — K219 Gastro-esophageal reflux disease without esophagitis: Secondary | ICD-10-CM | POA: Diagnosis not present

## 2018-08-26 DIAGNOSIS — J449 Chronic obstructive pulmonary disease, unspecified: Secondary | ICD-10-CM | POA: Diagnosis not present

## 2018-08-26 DIAGNOSIS — F339 Major depressive disorder, recurrent, unspecified: Secondary | ICD-10-CM | POA: Diagnosis not present

## 2018-08-28 DIAGNOSIS — J301 Allergic rhinitis due to pollen: Secondary | ICD-10-CM | POA: Diagnosis not present

## 2018-08-28 DIAGNOSIS — J449 Chronic obstructive pulmonary disease, unspecified: Secondary | ICD-10-CM | POA: Diagnosis not present

## 2018-08-28 DIAGNOSIS — K219 Gastro-esophageal reflux disease without esophagitis: Secondary | ICD-10-CM | POA: Diagnosis not present

## 2018-08-28 DIAGNOSIS — I10 Essential (primary) hypertension: Secondary | ICD-10-CM | POA: Diagnosis not present

## 2018-08-28 DIAGNOSIS — F339 Major depressive disorder, recurrent, unspecified: Secondary | ICD-10-CM | POA: Diagnosis not present

## 2018-08-29 DIAGNOSIS — K219 Gastro-esophageal reflux disease without esophagitis: Secondary | ICD-10-CM | POA: Diagnosis not present

## 2018-08-29 DIAGNOSIS — I10 Essential (primary) hypertension: Secondary | ICD-10-CM | POA: Diagnosis not present

## 2018-08-29 DIAGNOSIS — J449 Chronic obstructive pulmonary disease, unspecified: Secondary | ICD-10-CM | POA: Diagnosis not present

## 2018-08-29 DIAGNOSIS — J301 Allergic rhinitis due to pollen: Secondary | ICD-10-CM | POA: Diagnosis not present

## 2018-08-29 DIAGNOSIS — F339 Major depressive disorder, recurrent, unspecified: Secondary | ICD-10-CM | POA: Diagnosis not present

## 2018-09-01 DIAGNOSIS — J449 Chronic obstructive pulmonary disease, unspecified: Secondary | ICD-10-CM | POA: Diagnosis not present

## 2018-09-01 DIAGNOSIS — I10 Essential (primary) hypertension: Secondary | ICD-10-CM | POA: Diagnosis not present

## 2018-09-01 DIAGNOSIS — F339 Major depressive disorder, recurrent, unspecified: Secondary | ICD-10-CM | POA: Diagnosis not present

## 2018-09-01 DIAGNOSIS — J301 Allergic rhinitis due to pollen: Secondary | ICD-10-CM | POA: Diagnosis not present

## 2018-09-01 DIAGNOSIS — K219 Gastro-esophageal reflux disease without esophagitis: Secondary | ICD-10-CM | POA: Diagnosis not present

## 2018-09-02 DIAGNOSIS — J449 Chronic obstructive pulmonary disease, unspecified: Secondary | ICD-10-CM | POA: Diagnosis not present

## 2018-09-02 DIAGNOSIS — K219 Gastro-esophageal reflux disease without esophagitis: Secondary | ICD-10-CM | POA: Diagnosis not present

## 2018-09-02 DIAGNOSIS — J301 Allergic rhinitis due to pollen: Secondary | ICD-10-CM | POA: Diagnosis not present

## 2018-09-02 DIAGNOSIS — I10 Essential (primary) hypertension: Secondary | ICD-10-CM | POA: Diagnosis not present

## 2018-09-02 DIAGNOSIS — F339 Major depressive disorder, recurrent, unspecified: Secondary | ICD-10-CM | POA: Diagnosis not present

## 2018-09-03 ENCOUNTER — Telehealth: Payer: Self-pay | Admitting: Pulmonary Disease

## 2018-09-03 NOTE — Telephone Encounter (Signed)
lmtcb x1 for Hospice.  I have checked RA's cubby, it does not appear that forms have been received.

## 2018-09-04 DIAGNOSIS — F339 Major depressive disorder, recurrent, unspecified: Secondary | ICD-10-CM | POA: Diagnosis not present

## 2018-09-04 DIAGNOSIS — J301 Allergic rhinitis due to pollen: Secondary | ICD-10-CM | POA: Diagnosis not present

## 2018-09-04 DIAGNOSIS — I10 Essential (primary) hypertension: Secondary | ICD-10-CM | POA: Diagnosis not present

## 2018-09-04 DIAGNOSIS — K219 Gastro-esophageal reflux disease without esophagitis: Secondary | ICD-10-CM | POA: Diagnosis not present

## 2018-09-04 DIAGNOSIS — J449 Chronic obstructive pulmonary disease, unspecified: Secondary | ICD-10-CM | POA: Diagnosis not present

## 2018-09-04 NOTE — Telephone Encounter (Signed)
I have the forms. Will have RA sign them when he returns to clinic next Tuesday.

## 2018-09-05 NOTE — Telephone Encounter (Signed)
shavonne calling from hospice hospice to check on statue of MD signature on orders that were refaxed she can be reached @ [phone].Hillery Hunter

## 2018-09-07 ENCOUNTER — Encounter: Payer: Self-pay | Admitting: Sports Medicine

## 2018-09-09 NOTE — Telephone Encounter (Signed)
[name], please advise if the forms have been completed by Dr. Elsworth Soho. Thanks!

## 2018-09-11 ENCOUNTER — Telehealth: Payer: Self-pay | Admitting: Pulmonary Disease

## 2018-09-11 DIAGNOSIS — I10 Essential (primary) hypertension: Secondary | ICD-10-CM | POA: Diagnosis not present

## 2018-09-11 DIAGNOSIS — J301 Allergic rhinitis due to pollen: Secondary | ICD-10-CM | POA: Diagnosis not present

## 2018-09-11 DIAGNOSIS — K219 Gastro-esophageal reflux disease without esophagitis: Secondary | ICD-10-CM | POA: Diagnosis not present

## 2018-09-11 DIAGNOSIS — F339 Major depressive disorder, recurrent, unspecified: Secondary | ICD-10-CM | POA: Diagnosis not present

## 2018-09-11 DIAGNOSIS — J449 Chronic obstructive pulmonary disease, unspecified: Secondary | ICD-10-CM | POA: Diagnosis not present

## 2018-09-11 NOTE — Telephone Encounter (Signed)
These have been signed and faxed back to Hospice. Will close this encounter.

## 2018-09-11 NOTE — Telephone Encounter (Signed)
Orders have been re-faxed. Confirmation confirmed. Left message with Lockie Mola asking that she give [place] a call to confirm she received the orders.

## 2018-09-12 DIAGNOSIS — K219 Gastro-esophageal reflux disease without esophagitis: Secondary | ICD-10-CM | POA: Diagnosis not present

## 2018-09-12 DIAGNOSIS — I10 Essential (primary) hypertension: Secondary | ICD-10-CM | POA: Diagnosis not present

## 2018-09-12 DIAGNOSIS — J301 Allergic rhinitis due to pollen: Secondary | ICD-10-CM | POA: Diagnosis not present

## 2018-09-12 DIAGNOSIS — F339 Major depressive disorder, recurrent, unspecified: Secondary | ICD-10-CM | POA: Diagnosis not present

## 2018-09-12 DIAGNOSIS — J449 Chronic obstructive pulmonary disease, unspecified: Secondary | ICD-10-CM | POA: Diagnosis not present

## 2018-09-15 DIAGNOSIS — F339 Major depressive disorder, recurrent, unspecified: Secondary | ICD-10-CM | POA: Diagnosis not present

## 2018-09-15 DIAGNOSIS — I10 Essential (primary) hypertension: Secondary | ICD-10-CM | POA: Diagnosis not present

## 2018-09-15 DIAGNOSIS — K219 Gastro-esophageal reflux disease without esophagitis: Secondary | ICD-10-CM | POA: Diagnosis not present

## 2018-09-15 DIAGNOSIS — J301 Allergic rhinitis due to pollen: Secondary | ICD-10-CM | POA: Diagnosis not present

## 2018-09-15 DIAGNOSIS — J449 Chronic obstructive pulmonary disease, unspecified: Secondary | ICD-10-CM | POA: Diagnosis not present

## 2018-09-15 NOTE — Telephone Encounter (Signed)
Attempted to call [place] with Hospice but unable to reach her. Left message for Lockie Mola to return call.

## 2018-09-16 NOTE — Telephone Encounter (Signed)
Spoke with pt [name], she received the orders. Nothing further is needed

## 2018-09-16 NOTE — Telephone Encounter (Signed)
[name] returned phone call and confirmed she received orders.

## 2018-09-16 NOTE — Telephone Encounter (Signed)
Attempted to call [name] but unable to reach her. Left message for [name] to return call.

## 2018-09-18 DIAGNOSIS — I10 Essential (primary) hypertension: Secondary | ICD-10-CM | POA: Diagnosis not present

## 2018-09-18 DIAGNOSIS — J301 Allergic rhinitis due to pollen: Secondary | ICD-10-CM | POA: Diagnosis not present

## 2018-09-18 DIAGNOSIS — K219 Gastro-esophageal reflux disease without esophagitis: Secondary | ICD-10-CM | POA: Diagnosis not present

## 2018-09-18 DIAGNOSIS — F339 Major depressive disorder, recurrent, unspecified: Secondary | ICD-10-CM | POA: Diagnosis not present

## 2018-09-18 DIAGNOSIS — J449 Chronic obstructive pulmonary disease, unspecified: Secondary | ICD-10-CM | POA: Diagnosis not present

## 2018-09-19 ENCOUNTER — Telehealth: Payer: Self-pay | Admitting: Family Medicine

## 2018-09-19 DIAGNOSIS — F339 Major depressive disorder, recurrent, unspecified: Secondary | ICD-10-CM | POA: Diagnosis not present

## 2018-09-19 DIAGNOSIS — I10 Essential (primary) hypertension: Secondary | ICD-10-CM | POA: Diagnosis not present

## 2018-09-19 DIAGNOSIS — J449 Chronic obstructive pulmonary disease, unspecified: Secondary | ICD-10-CM | POA: Diagnosis not present

## 2018-09-19 DIAGNOSIS — J301 Allergic rhinitis due to pollen: Secondary | ICD-10-CM | POA: Diagnosis not present

## 2018-09-19 DIAGNOSIS — K219 Gastro-esophageal reflux disease without esophagitis: Secondary | ICD-10-CM | POA: Diagnosis not present

## 2018-09-19 NOTE — Telephone Encounter (Signed)
[name],  Patient called and requests to speak to you only in reference to her referral that she stated you were working on with her.Please call to advise.

## 2018-09-22 ENCOUNTER — Ambulatory Visit: Payer: Medicare PPO | Admitting: Sports Medicine

## 2018-09-22 DIAGNOSIS — J301 Allergic rhinitis due to pollen: Secondary | ICD-10-CM | POA: Diagnosis not present

## 2018-09-22 DIAGNOSIS — I10 Essential (primary) hypertension: Secondary | ICD-10-CM | POA: Diagnosis not present

## 2018-09-22 DIAGNOSIS — K219 Gastro-esophageal reflux disease without esophagitis: Secondary | ICD-10-CM | POA: Diagnosis not present

## 2018-09-22 DIAGNOSIS — J449 Chronic obstructive pulmonary disease, unspecified: Secondary | ICD-10-CM | POA: Diagnosis not present

## 2018-09-22 DIAGNOSIS — F339 Major depressive disorder, recurrent, unspecified: Secondary | ICD-10-CM | POA: Diagnosis not present

## 2018-09-26 DIAGNOSIS — J449 Chronic obstructive pulmonary disease, unspecified: Secondary | ICD-10-CM | POA: Diagnosis not present

## 2018-09-26 DIAGNOSIS — F339 Major depressive disorder, recurrent, unspecified: Secondary | ICD-10-CM | POA: Diagnosis not present

## 2018-09-26 DIAGNOSIS — K219 Gastro-esophageal reflux disease without esophagitis: Secondary | ICD-10-CM | POA: Diagnosis not present

## 2018-09-26 DIAGNOSIS — I10 Essential (primary) hypertension: Secondary | ICD-10-CM | POA: Diagnosis not present

## 2018-09-26 DIAGNOSIS — J301 Allergic rhinitis due to pollen: Secondary | ICD-10-CM | POA: Diagnosis not present

## 2018-09-28 DIAGNOSIS — I10 Essential (primary) hypertension: Secondary | ICD-10-CM | POA: Diagnosis not present

## 2018-09-28 DIAGNOSIS — J301 Allergic rhinitis due to pollen: Secondary | ICD-10-CM | POA: Diagnosis not present

## 2018-09-28 DIAGNOSIS — K219 Gastro-esophageal reflux disease without esophagitis: Secondary | ICD-10-CM | POA: Diagnosis not present

## 2018-09-28 DIAGNOSIS — J449 Chronic obstructive pulmonary disease, unspecified: Secondary | ICD-10-CM | POA: Diagnosis not present

## 2018-09-28 DIAGNOSIS — F339 Major depressive disorder, recurrent, unspecified: Secondary | ICD-10-CM | POA: Diagnosis not present

## 2018-09-29 DIAGNOSIS — I10 Essential (primary) hypertension: Secondary | ICD-10-CM | POA: Diagnosis not present

## 2018-09-29 DIAGNOSIS — J301 Allergic rhinitis due to pollen: Secondary | ICD-10-CM | POA: Diagnosis not present

## 2018-09-29 DIAGNOSIS — J449 Chronic obstructive pulmonary disease, unspecified: Secondary | ICD-10-CM | POA: Diagnosis not present

## 2018-09-29 DIAGNOSIS — K219 Gastro-esophageal reflux disease without esophagitis: Secondary | ICD-10-CM | POA: Diagnosis not present

## 2018-09-29 DIAGNOSIS — F339 Major depressive disorder, recurrent, unspecified: Secondary | ICD-10-CM | POA: Diagnosis not present

## 2018-09-30 DIAGNOSIS — F339 Major depressive disorder, recurrent, unspecified: Secondary | ICD-10-CM | POA: Diagnosis not present

## 2018-09-30 DIAGNOSIS — I10 Essential (primary) hypertension: Secondary | ICD-10-CM | POA: Diagnosis not present

## 2018-09-30 DIAGNOSIS — J301 Allergic rhinitis due to pollen: Secondary | ICD-10-CM | POA: Diagnosis not present

## 2018-09-30 DIAGNOSIS — K219 Gastro-esophageal reflux disease without esophagitis: Secondary | ICD-10-CM | POA: Diagnosis not present

## 2018-09-30 DIAGNOSIS — J449 Chronic obstructive pulmonary disease, unspecified: Secondary | ICD-10-CM | POA: Diagnosis not present

## 2018-10-01 DIAGNOSIS — J449 Chronic obstructive pulmonary disease, unspecified: Secondary | ICD-10-CM | POA: Diagnosis not present

## 2018-10-01 DIAGNOSIS — J301 Allergic rhinitis due to pollen: Secondary | ICD-10-CM | POA: Diagnosis not present

## 2018-10-01 DIAGNOSIS — F339 Major depressive disorder, recurrent, unspecified: Secondary | ICD-10-CM | POA: Diagnosis not present

## 2018-10-01 DIAGNOSIS — I10 Essential (primary) hypertension: Secondary | ICD-10-CM | POA: Diagnosis not present

## 2018-10-01 DIAGNOSIS — K219 Gastro-esophageal reflux disease without esophagitis: Secondary | ICD-10-CM | POA: Diagnosis not present

## 2018-10-03 DIAGNOSIS — J301 Allergic rhinitis due to pollen: Secondary | ICD-10-CM | POA: Diagnosis not present

## 2018-10-03 DIAGNOSIS — F339 Major depressive disorder, recurrent, unspecified: Secondary | ICD-10-CM | POA: Diagnosis not present

## 2018-10-03 DIAGNOSIS — I10 Essential (primary) hypertension: Secondary | ICD-10-CM | POA: Diagnosis not present

## 2018-10-03 DIAGNOSIS — J449 Chronic obstructive pulmonary disease, unspecified: Secondary | ICD-10-CM | POA: Diagnosis not present

## 2018-10-03 DIAGNOSIS — K219 Gastro-esophageal reflux disease without esophagitis: Secondary | ICD-10-CM | POA: Diagnosis not present

## 2018-10-06 DIAGNOSIS — F339 Major depressive disorder, recurrent, unspecified: Secondary | ICD-10-CM | POA: Diagnosis not present

## 2018-10-06 DIAGNOSIS — J449 Chronic obstructive pulmonary disease, unspecified: Secondary | ICD-10-CM | POA: Diagnosis not present

## 2018-10-06 DIAGNOSIS — K219 Gastro-esophageal reflux disease without esophagitis: Secondary | ICD-10-CM | POA: Diagnosis not present

## 2018-10-06 DIAGNOSIS — I10 Essential (primary) hypertension: Secondary | ICD-10-CM | POA: Diagnosis not present

## 2018-10-06 DIAGNOSIS — J301 Allergic rhinitis due to pollen: Secondary | ICD-10-CM | POA: Diagnosis not present

## 2018-10-07 ENCOUNTER — Ambulatory Visit: Payer: Medicare PPO | Admitting: Physical Therapy

## 2018-10-07 ENCOUNTER — Telehealth: Payer: Self-pay | Admitting: Sports Medicine

## 2018-10-07 NOTE — Telephone Encounter (Signed)
Spoke to [place] today - she wanted to thank me for working on her physical therapy referral.  She wanted to let us know that she is having some problems with her COPD and she cannot add anything on right now.  I told her to let us know when she gets better and we can re-access and potentially get her back in for physical therapy if Dr Paulla Fore advises it at that time.

## 2018-10-09 DIAGNOSIS — I10 Essential (primary) hypertension: Secondary | ICD-10-CM | POA: Diagnosis not present

## 2018-10-09 DIAGNOSIS — J449 Chronic obstructive pulmonary disease, unspecified: Secondary | ICD-10-CM | POA: Diagnosis not present

## 2018-10-09 DIAGNOSIS — F339 Major depressive disorder, recurrent, unspecified: Secondary | ICD-10-CM | POA: Diagnosis not present

## 2018-10-09 DIAGNOSIS — J301 Allergic rhinitis due to pollen: Secondary | ICD-10-CM | POA: Diagnosis not present

## 2018-10-09 DIAGNOSIS — K219 Gastro-esophageal reflux disease without esophagitis: Secondary | ICD-10-CM | POA: Diagnosis not present

## 2018-10-10 DIAGNOSIS — J449 Chronic obstructive pulmonary disease, unspecified: Secondary | ICD-10-CM | POA: Diagnosis not present

## 2018-10-10 DIAGNOSIS — J301 Allergic rhinitis due to pollen: Secondary | ICD-10-CM | POA: Diagnosis not present

## 2018-10-10 DIAGNOSIS — K219 Gastro-esophageal reflux disease without esophagitis: Secondary | ICD-10-CM | POA: Diagnosis not present

## 2018-10-10 DIAGNOSIS — I10 Essential (primary) hypertension: Secondary | ICD-10-CM | POA: Diagnosis not present

## 2018-10-10 DIAGNOSIS — F339 Major depressive disorder, recurrent, unspecified: Secondary | ICD-10-CM | POA: Diagnosis not present

## 2018-10-11 DIAGNOSIS — J301 Allergic rhinitis due to pollen: Secondary | ICD-10-CM | POA: Diagnosis not present

## 2018-10-11 DIAGNOSIS — I10 Essential (primary) hypertension: Secondary | ICD-10-CM | POA: Diagnosis not present

## 2018-10-11 DIAGNOSIS — J449 Chronic obstructive pulmonary disease, unspecified: Secondary | ICD-10-CM | POA: Diagnosis not present

## 2018-10-11 DIAGNOSIS — K219 Gastro-esophageal reflux disease without esophagitis: Secondary | ICD-10-CM | POA: Diagnosis not present

## 2018-10-11 DIAGNOSIS — F339 Major depressive disorder, recurrent, unspecified: Secondary | ICD-10-CM | POA: Diagnosis not present

## 2018-10-13 DIAGNOSIS — J449 Chronic obstructive pulmonary disease, unspecified: Secondary | ICD-10-CM | POA: Diagnosis not present

## 2018-10-13 DIAGNOSIS — I10 Essential (primary) hypertension: Secondary | ICD-10-CM | POA: Diagnosis not present

## 2018-10-13 DIAGNOSIS — J301 Allergic rhinitis due to pollen: Secondary | ICD-10-CM | POA: Diagnosis not present

## 2018-10-13 DIAGNOSIS — K219 Gastro-esophageal reflux disease without esophagitis: Secondary | ICD-10-CM | POA: Diagnosis not present

## 2018-10-13 DIAGNOSIS — F339 Major depressive disorder, recurrent, unspecified: Secondary | ICD-10-CM | POA: Diagnosis not present

## 2018-10-14 DIAGNOSIS — J449 Chronic obstructive pulmonary disease, unspecified: Secondary | ICD-10-CM | POA: Diagnosis not present

## 2018-10-14 DIAGNOSIS — K219 Gastro-esophageal reflux disease without esophagitis: Secondary | ICD-10-CM | POA: Diagnosis not present

## 2018-10-14 DIAGNOSIS — F339 Major depressive disorder, recurrent, unspecified: Secondary | ICD-10-CM | POA: Diagnosis not present

## 2018-10-14 DIAGNOSIS — I10 Essential (primary) hypertension: Secondary | ICD-10-CM | POA: Diagnosis not present

## 2018-10-14 DIAGNOSIS — J301 Allergic rhinitis due to pollen: Secondary | ICD-10-CM | POA: Diagnosis not present

## 2018-10-16 DIAGNOSIS — K219 Gastro-esophageal reflux disease without esophagitis: Secondary | ICD-10-CM | POA: Diagnosis not present

## 2018-10-16 DIAGNOSIS — J301 Allergic rhinitis due to pollen: Secondary | ICD-10-CM | POA: Diagnosis not present

## 2018-10-16 DIAGNOSIS — J449 Chronic obstructive pulmonary disease, unspecified: Secondary | ICD-10-CM | POA: Diagnosis not present

## 2018-10-16 DIAGNOSIS — F339 Major depressive disorder, recurrent, unspecified: Secondary | ICD-10-CM | POA: Diagnosis not present

## 2018-10-16 DIAGNOSIS — I10 Essential (primary) hypertension: Secondary | ICD-10-CM | POA: Diagnosis not present

## 2018-10-17 DIAGNOSIS — J301 Allergic rhinitis due to pollen: Secondary | ICD-10-CM | POA: Diagnosis not present

## 2018-10-17 DIAGNOSIS — I10 Essential (primary) hypertension: Secondary | ICD-10-CM | POA: Diagnosis not present

## 2018-10-17 DIAGNOSIS — F339 Major depressive disorder, recurrent, unspecified: Secondary | ICD-10-CM | POA: Diagnosis not present

## 2018-10-17 DIAGNOSIS — K219 Gastro-esophageal reflux disease without esophagitis: Secondary | ICD-10-CM | POA: Diagnosis not present

## 2018-10-17 DIAGNOSIS — J449 Chronic obstructive pulmonary disease, unspecified: Secondary | ICD-10-CM | POA: Diagnosis not present

## 2018-10-20 DIAGNOSIS — K219 Gastro-esophageal reflux disease without esophagitis: Secondary | ICD-10-CM | POA: Diagnosis not present

## 2018-10-20 DIAGNOSIS — J301 Allergic rhinitis due to pollen: Secondary | ICD-10-CM | POA: Diagnosis not present

## 2018-10-20 DIAGNOSIS — F339 Major depressive disorder, recurrent, unspecified: Secondary | ICD-10-CM | POA: Diagnosis not present

## 2018-10-20 DIAGNOSIS — J449 Chronic obstructive pulmonary disease, unspecified: Secondary | ICD-10-CM | POA: Diagnosis not present

## 2018-10-20 DIAGNOSIS — I10 Essential (primary) hypertension: Secondary | ICD-10-CM | POA: Diagnosis not present

## 2018-10-21 DIAGNOSIS — J449 Chronic obstructive pulmonary disease, unspecified: Secondary | ICD-10-CM | POA: Diagnosis not present

## 2018-10-21 DIAGNOSIS — F339 Major depressive disorder, recurrent, unspecified: Secondary | ICD-10-CM | POA: Diagnosis not present

## 2018-10-21 DIAGNOSIS — J301 Allergic rhinitis due to pollen: Secondary | ICD-10-CM | POA: Diagnosis not present

## 2018-10-21 DIAGNOSIS — I10 Essential (primary) hypertension: Secondary | ICD-10-CM | POA: Diagnosis not present

## 2018-10-21 DIAGNOSIS — K219 Gastro-esophageal reflux disease without esophagitis: Secondary | ICD-10-CM | POA: Diagnosis not present

## 2018-10-23 DIAGNOSIS — K219 Gastro-esophageal reflux disease without esophagitis: Secondary | ICD-10-CM | POA: Diagnosis not present

## 2018-10-23 DIAGNOSIS — J449 Chronic obstructive pulmonary disease, unspecified: Secondary | ICD-10-CM | POA: Diagnosis not present

## 2018-10-23 DIAGNOSIS — I10 Essential (primary) hypertension: Secondary | ICD-10-CM | POA: Diagnosis not present

## 2018-10-23 DIAGNOSIS — J301 Allergic rhinitis due to pollen: Secondary | ICD-10-CM | POA: Diagnosis not present

## 2018-10-23 DIAGNOSIS — F339 Major depressive disorder, recurrent, unspecified: Secondary | ICD-10-CM | POA: Diagnosis not present

## 2018-10-27 DIAGNOSIS — I10 Essential (primary) hypertension: Secondary | ICD-10-CM | POA: Diagnosis not present

## 2018-10-27 DIAGNOSIS — J449 Chronic obstructive pulmonary disease, unspecified: Secondary | ICD-10-CM | POA: Diagnosis not present

## 2018-10-27 DIAGNOSIS — K219 Gastro-esophageal reflux disease without esophagitis: Secondary | ICD-10-CM | POA: Diagnosis not present

## 2018-10-27 DIAGNOSIS — F339 Major depressive disorder, recurrent, unspecified: Secondary | ICD-10-CM | POA: Diagnosis not present

## 2018-10-27 DIAGNOSIS — J301 Allergic rhinitis due to pollen: Secondary | ICD-10-CM | POA: Diagnosis not present

## 2018-10-29 DIAGNOSIS — J449 Chronic obstructive pulmonary disease, unspecified: Secondary | ICD-10-CM | POA: Diagnosis not present

## 2018-10-29 DIAGNOSIS — F339 Major depressive disorder, recurrent, unspecified: Secondary | ICD-10-CM | POA: Diagnosis not present

## 2018-10-29 DIAGNOSIS — J301 Allergic rhinitis due to pollen: Secondary | ICD-10-CM | POA: Diagnosis not present

## 2018-10-29 DIAGNOSIS — I10 Essential (primary) hypertension: Secondary | ICD-10-CM | POA: Diagnosis not present

## 2018-10-29 DIAGNOSIS — K219 Gastro-esophageal reflux disease without esophagitis: Secondary | ICD-10-CM | POA: Diagnosis not present

## 2018-10-30 DIAGNOSIS — J301 Allergic rhinitis due to pollen: Secondary | ICD-10-CM | POA: Diagnosis not present

## 2018-10-30 DIAGNOSIS — J449 Chronic obstructive pulmonary disease, unspecified: Secondary | ICD-10-CM | POA: Diagnosis not present

## 2018-10-30 DIAGNOSIS — I10 Essential (primary) hypertension: Secondary | ICD-10-CM | POA: Diagnosis not present

## 2018-10-30 DIAGNOSIS — K219 Gastro-esophageal reflux disease without esophagitis: Secondary | ICD-10-CM | POA: Diagnosis not present

## 2018-10-30 DIAGNOSIS — F339 Major depressive disorder, recurrent, unspecified: Secondary | ICD-10-CM | POA: Diagnosis not present

## 2018-10-31 DIAGNOSIS — F339 Major depressive disorder, recurrent, unspecified: Secondary | ICD-10-CM | POA: Diagnosis not present

## 2018-10-31 DIAGNOSIS — J301 Allergic rhinitis due to pollen: Secondary | ICD-10-CM | POA: Diagnosis not present

## 2018-10-31 DIAGNOSIS — J449 Chronic obstructive pulmonary disease, unspecified: Secondary | ICD-10-CM | POA: Diagnosis not present

## 2018-10-31 DIAGNOSIS — I10 Essential (primary) hypertension: Secondary | ICD-10-CM | POA: Diagnosis not present

## 2018-10-31 DIAGNOSIS — K219 Gastro-esophageal reflux disease without esophagitis: Secondary | ICD-10-CM | POA: Diagnosis not present

## 2018-11-03 DIAGNOSIS — J449 Chronic obstructive pulmonary disease, unspecified: Secondary | ICD-10-CM | POA: Diagnosis not present

## 2018-11-03 DIAGNOSIS — I10 Essential (primary) hypertension: Secondary | ICD-10-CM | POA: Diagnosis not present

## 2018-11-03 DIAGNOSIS — J301 Allergic rhinitis due to pollen: Secondary | ICD-10-CM | POA: Diagnosis not present

## 2018-11-03 DIAGNOSIS — K219 Gastro-esophageal reflux disease without esophagitis: Secondary | ICD-10-CM | POA: Diagnosis not present

## 2018-11-03 DIAGNOSIS — F339 Major depressive disorder, recurrent, unspecified: Secondary | ICD-10-CM | POA: Diagnosis not present

## 2018-11-05 DIAGNOSIS — J449 Chronic obstructive pulmonary disease, unspecified: Secondary | ICD-10-CM | POA: Diagnosis not present

## 2018-11-05 DIAGNOSIS — I10 Essential (primary) hypertension: Secondary | ICD-10-CM | POA: Diagnosis not present

## 2018-11-05 DIAGNOSIS — J301 Allergic rhinitis due to pollen: Secondary | ICD-10-CM | POA: Diagnosis not present

## 2018-11-05 DIAGNOSIS — F339 Major depressive disorder, recurrent, unspecified: Secondary | ICD-10-CM | POA: Diagnosis not present

## 2018-11-05 DIAGNOSIS — K219 Gastro-esophageal reflux disease without esophagitis: Secondary | ICD-10-CM | POA: Diagnosis not present

## 2018-11-06 ENCOUNTER — Ambulatory Visit (INDEPENDENT_AMBULATORY_CARE_PROVIDER_SITE_OTHER): Payer: Medicare PPO | Admitting: Medical

## 2018-11-06 ENCOUNTER — Encounter: Payer: Self-pay | Admitting: Medical

## 2018-11-06 ENCOUNTER — Ambulatory Visit: Payer: Self-pay

## 2018-11-06 VITALS — BP 128/55 | HR 97 | Temp 98.5°F | Resp 16 | Ht 61.0 in | Wt 83.8 lb

## 2018-11-06 DIAGNOSIS — L089 Local infection of the skin and subcutaneous tissue, unspecified: Secondary | ICD-10-CM | POA: Diagnosis not present

## 2018-11-06 DIAGNOSIS — F339 Major depressive disorder, recurrent, unspecified: Secondary | ICD-10-CM | POA: Diagnosis not present

## 2018-11-06 DIAGNOSIS — J449 Chronic obstructive pulmonary disease, unspecified: Secondary | ICD-10-CM | POA: Diagnosis not present

## 2018-11-06 DIAGNOSIS — J301 Allergic rhinitis due to pollen: Secondary | ICD-10-CM | POA: Diagnosis not present

## 2018-11-06 DIAGNOSIS — I10 Essential (primary) hypertension: Secondary | ICD-10-CM | POA: Diagnosis not present

## 2018-11-06 DIAGNOSIS — K219 Gastro-esophageal reflux disease without esophagitis: Secondary | ICD-10-CM | POA: Diagnosis not present

## 2018-11-06 MED ORDER — DOXYCYCLINE HYCLATE 100 MG PO TABS: 100.0000 mg | ORAL_TABLET | Freq: Two times a day (BID) | ORAL | 0 refills | Status: DC

## 2018-11-06 NOTE — Progress Notes (Signed)
Subjective:    Patient ID: [name], female    DOB: 1941-03-04, 78 y.o.   MRN: [id number]  HPI  Pt in for left forearm mild red, warm, and tender since yesterday. Distal wrist was area hurt at first. States area was puffy yesterday but not swollen today. Husband states cat may have bit her. Pt states did not happen.  No history of skin infection.   Review of Systems  Constitutional: Negative for chills, fatigue and fever.  Respiratory: Negative for cough, chest tightness, shortness of breath and wheezing.   Cardiovascular: Negative for chest pain and palpitations.  Gastrointestinal: Negative for abdominal pain, constipation and vomiting.  Musculoskeletal: Negative for back pain.  Skin:       Left arm- red, warm and tender.  Neurological: Negative for facial asymmetry, numbness and headaches.  Hematological: Negative for adenopathy. Does not bruise/bleed easily.  Psychiatric/Behavioral: Negative for behavioral problems and confusion.   Past Medical History:  Diagnosis Date  . Anxiety   . Arthralgia   . Atrophic vaginitis 12/07/2012  . Chest pain   . Clostridium difficile diarrhea 12/27/2014  . COPD (chronic obstructive pulmonary disease) ([place])   . Depression   . GERD (gastroesophageal reflux disease)   . Glucose intolerance (impaired glucose tolerance)   . History of small bowel obstruction   . Hx of adenomatous colonic polyps   . Hyperglycemia 12/01/2013  . Hyperlipidemia 03/19/2017  . Hypertension   . Ischemic colitis ([place])   . Medicare annual wellness visit, subsequent 01/12/2016  . Migraine   . Nocturia 09/02/2013  . Osteoporosis   . Thyroid disease   . Weight loss      Social History   Socioeconomic History  . Marital status: Married    Spouse name: Not on file  . Number of children: 1  . Years of education: Not on file  . Highest education level: Not on file  Occupational History  . Occupation: Retired Careers adviser:  RETIRED  Social Needs  . Financial resource strain: Not on file  . Food insecurity:    Worry: Not on file    Inability: Not on file  . Transportation needs:    Medical: Not on file    Non-medical: Not on file  Tobacco Use  . Smoking status: Former Smoker    Packs/day: 2.00    Years: 40.00    Pack years: 80.00    Types: Cigarettes    Last attempt to quit: 10/29/2001    Years since quitting: 17.0  . Smokeless tobacco: Never Used  Substance and Sexual Activity  . Alcohol use: No  . Drug use: No  . Sexual activity: Never  Lifestyle  . Physical activity:    Days per week: Not on file    Minutes per session: Not on file  . Stress: Not on file  Relationships  . Social connections:    Talks on phone: Not on file    Gets together: Not on file    Attends religious service: Not on file    Active member of club or organization: Not on file    Attends meetings of clubs or organizations: Not on file    Relationship status: Not on file  . Intimate partner violence:    Fear of current or ex partner: Not on file    Emotionally abused: Not on file    Physically abused: Not on file    Forced sexual activity: Not on file  Other Topics Concern  . Not on file  Social History Narrative  . Not on file    Past Surgical History:  Procedure Laterality Date  . ABDOMINAL HYSTERECTOMY  1993  . BLADDER REPAIR    . CHOLECYSTECTOMY    . COLONOSCOPY WITH PROPOFOL N/A 06/08/2013   Procedure: COLONOSCOPY WITH PROPOFOL;  Surgeon: Lafayette Dragon, MD;  Location: [facility];  Service: Endoscopy;  Laterality: N/A;  . DILATION AND CURETTAGE OF UTERUS     40 yrs ago  . EYE SURGERY  04/2011   bil. eyes  . TUBAL LIGATION      Family History  Problem Relation Age of Onset  . Alcohol abuse Mother   . Heart disease Mother   . Stroke Mother   . Hypertension Mother   . Kidney disease Mother   . COPD Mother   . Osteoporosis Mother        hip fracture  . COPD Brother   . Alcohol abuse Brother         drug abuse  . Hearing loss Brother        mvp  . Alcohol abuse Father        MVA  . Thyroid disease Daughter   . COPD Paternal Grandfather   . COPD Sister   . Hypertension Sister   . Anxiety disorder Sister   . COPD Sister   . COPD Brother        current smoker  . Hearing loss Brother        cad  . Prostate cancer Brother   . COPD Brother   . Colon cancer Paternal Aunt   . Prostate cancer Unknown     Allergies  Allergen Reactions  . Morphine Other (See Comments)    REACTION: nightmares  . Ambien [Zolpidem] Anxiety  . Cymbalta [Duloxetine Hcl] Anxiety  . Nitrofurantoin Rash    Current Outpatient Medications on File Prior to Visit  Medication Sig Dispense Refill  . albuterol (PROVENTIL HFA;VENTOLIN HFA) 108 (90 BASE) MCG/ACT inhaler Inhale 2 puffs into the lungs every 6 (six) hours as needed for wheezing or shortness of breath. 3 Inhaler 3  . ALPRAZolam (XANAX) 0.5 MG tablet Take 1 tablet (0.5 mg total) by mouth 2 (two) times daily as needed. for anxiety 180 tablet 0  . arformoterol (BROVANA) 15 MCG/2ML NEBU Take 2 mLs (15 mcg total) by nebulization 2 (two) times daily. Dx J44.9 360 mL 3  . budesonide (PULMICORT) 0.25 MG/2ML nebulizer solution Take 2 mLs (0.25 mg total) by nebulization 2 (two) times daily. Dx: J44.9 360 mL 3  . Calcium Carbonate-Vitamin D 600-400 MG-UNIT per tablet Take 1 tablet by mouth 2 (two) times daily.      . citalopram (CELEXA) 20 MG tablet TAKE 1 TABLET(20 MG) BY MOUTH DAILY 30 tablet 0  . Ferrous Fumarate-Folic Acid (HEMOCYTE-F) 324-1 MG TABS Take 1 tablet by mouth daily. 30 each 5  . fluticasone (FLONASE) 50 MCG/ACT nasal spray Place 2 sprays into both nostrils 2 (two) times daily as needed.     . Glucosamine-Chondroitin (GLUCOSAMINE CHONDR COMPLEX PO) Take 1 tablet by mouth 2 (two) times daily.     Marland Kitchen ipratropium (ATROVENT) 0.02 % nebulizer solution Take 2.5 mLs (0.5 mg total) by nebulization 4 (four) times daily. 900 mL 3  . ipratropium-albuterol  (DUONEB) 0.5-2.5 (3) MG/3ML SOLN USE 1 VIAL VIA NEBULIZER EVERY 6 HOURS AS NEEDED 180 mL 0  . metoprolol succinate (TOPROL-XL) 25 MG 24 hr tablet Take  1 tablet (25 mg total) by mouth daily. 90 tablet 1  . mirtazapine (REMERON) 15 MG tablet Take 1 tablet (15 mg total) by mouth at bedtime. 90 tablet 2  . pantoprazole (PROTONIX) 40 MG tablet TAKE 1 TABLET(40 MG) BY MOUTH DAILY 90 tablet 1  . predniSONE (DELTASONE) 20 MG tablet Take 1 tablet (20 mg total) by mouth daily with breakfast. 30 tablet 2  . traMADol (ULTRAM) 50 MG tablet TK 1 T PO Q 6 H PRN P  0   No current facility-administered medications on file prior to visit.     BP (!) 128/55   Pulse 97   Temp 98.5 F (36.9 C) (Oral)   Resp 16   Ht 5\' 1"  (1.549 m)   Wt 83 lb 12.8 oz (38 kg)   SpO2 100%   BMI 15.83 kg/m       Objective:   Physical Exam  General- No acute distress. Pleasant patient. Neck- Full range of motion, no jvd Lungs- Clear, even and unlabored. Heart- regular rate and rhythm. Neurologic- CNII- XII grossly intact.  Left forearm-mild red, faint warm and tender. Tender mostly in dorsal aspect. Area of  Faint redness about 10 cm x 1cm      Assessment & Plan:  You do appear to have probable early skin infection left forearm. I want you to start doxycycline antibiotic. Rx advisement given. If redness, pain, swelling worsens rapidly then ED evaluation.  Follow up in one week or as needed  [employer], PA-C

## 2018-11-06 NOTE — Telephone Encounter (Signed)
Pt. Reports she noticed her left wrist had a red area. Today she has swelling, redness "half way up my forearm. My husband says it is warm to touch." Also has swelling.Has mild-moderate pain. Does not remember any injury."It's worrying me." Appointment made for today. No availability with Dr. Charlett Blake.   Reason for Disposition . Looks like a boil, infected sore, deep ulcer or other infected rash (spreading redness, pus)  Answer Assessment - Initial Assessment Questions 1. ONSET: "When did the pain start?"     Yesterday 2. LOCATION: "Where is the pain located?"     Left wrist and hand 3. PAIN: "How bad is the pain?" (Scale 1-10; or mild, moderate, severe)   - MILD (1-3): doesn't interfere with normal activities   - MODERATE (4-7): interferes with normal activities (e.g., work or school) or awakens from sleep   - SEVERE (8-10): excruciating pain, unable to use hand at all     3-4 4. WORK OR EXERCISE: "Has there been any recent work or exercise that involved this part of the body?"     No 5. CAUSE: "What do you think is causing the pain?"     Unsure 6. AGGRAVATING FACTORS: "What makes the pain worse?" (e.g., using computer)     No 7. OTHER SYMPTOMS: "Do you have any other symptoms?" (e.g., neck pain, swelling, rash, numbness, fever)     Swelling and redness 8. PREGNANCY: "Is there any chance you are pregnant?" "When was your last menstrual period?"     n/a  Protocols used: HAND AND WRIST PAIN-A-AH

## 2018-11-06 NOTE — Patient Instructions (Signed)
You do appear to have probable early skin infection left foearm. I want you to start doxycycline antibiotic. Rx advisement given. If redness, pain, swelling worsens rapidly then ED evaluation.  Follow up in one week or as needed

## 2018-11-07 DIAGNOSIS — J301 Allergic rhinitis due to pollen: Secondary | ICD-10-CM | POA: Diagnosis not present

## 2018-11-07 DIAGNOSIS — I10 Essential (primary) hypertension: Secondary | ICD-10-CM | POA: Diagnosis not present

## 2018-11-07 DIAGNOSIS — K219 Gastro-esophageal reflux disease without esophagitis: Secondary | ICD-10-CM | POA: Diagnosis not present

## 2018-11-07 DIAGNOSIS — J449 Chronic obstructive pulmonary disease, unspecified: Secondary | ICD-10-CM | POA: Diagnosis not present

## 2018-11-07 DIAGNOSIS — F339 Major depressive disorder, recurrent, unspecified: Secondary | ICD-10-CM | POA: Diagnosis not present

## 2018-11-10 DIAGNOSIS — F339 Major depressive disorder, recurrent, unspecified: Secondary | ICD-10-CM | POA: Diagnosis not present

## 2018-11-10 DIAGNOSIS — J449 Chronic obstructive pulmonary disease, unspecified: Secondary | ICD-10-CM | POA: Diagnosis not present

## 2018-11-10 DIAGNOSIS — J301 Allergic rhinitis due to pollen: Secondary | ICD-10-CM | POA: Diagnosis not present

## 2018-11-10 DIAGNOSIS — K219 Gastro-esophageal reflux disease without esophagitis: Secondary | ICD-10-CM | POA: Diagnosis not present

## 2018-11-10 DIAGNOSIS — I10 Essential (primary) hypertension: Secondary | ICD-10-CM | POA: Diagnosis not present

## 2018-11-11 DIAGNOSIS — J449 Chronic obstructive pulmonary disease, unspecified: Secondary | ICD-10-CM | POA: Diagnosis not present

## 2018-11-11 DIAGNOSIS — I10 Essential (primary) hypertension: Secondary | ICD-10-CM | POA: Diagnosis not present

## 2018-11-11 DIAGNOSIS — J301 Allergic rhinitis due to pollen: Secondary | ICD-10-CM | POA: Diagnosis not present

## 2018-11-11 DIAGNOSIS — K219 Gastro-esophageal reflux disease without esophagitis: Secondary | ICD-10-CM | POA: Diagnosis not present

## 2018-11-11 DIAGNOSIS — F339 Major depressive disorder, recurrent, unspecified: Secondary | ICD-10-CM | POA: Diagnosis not present

## 2018-11-12 ENCOUNTER — Ambulatory Visit: Payer: Medicare PPO | Admitting: Medical

## 2018-11-13 DIAGNOSIS — K219 Gastro-esophageal reflux disease without esophagitis: Secondary | ICD-10-CM | POA: Diagnosis not present

## 2018-11-13 DIAGNOSIS — I10 Essential (primary) hypertension: Secondary | ICD-10-CM | POA: Diagnosis not present

## 2018-11-13 DIAGNOSIS — J449 Chronic obstructive pulmonary disease, unspecified: Secondary | ICD-10-CM | POA: Diagnosis not present

## 2018-11-13 DIAGNOSIS — J301 Allergic rhinitis due to pollen: Secondary | ICD-10-CM | POA: Diagnosis not present

## 2018-11-13 DIAGNOSIS — F339 Major depressive disorder, recurrent, unspecified: Secondary | ICD-10-CM | POA: Diagnosis not present

## 2018-11-14 ENCOUNTER — Encounter: Payer: Self-pay | Admitting: Sports Medicine

## 2018-11-14 ENCOUNTER — Telehealth: Payer: Self-pay | Admitting: *Deleted

## 2018-11-14 ENCOUNTER — Ambulatory Visit: Payer: Medicare PPO | Admitting: Medical

## 2018-11-14 NOTE — Telephone Encounter (Signed)
Received Physician Orders from [place]; forwarded to provider/SLS 01/17

## 2018-11-17 DIAGNOSIS — I10 Essential (primary) hypertension: Secondary | ICD-10-CM | POA: Diagnosis not present

## 2018-11-17 DIAGNOSIS — K219 Gastro-esophageal reflux disease without esophagitis: Secondary | ICD-10-CM | POA: Diagnosis not present

## 2018-11-17 DIAGNOSIS — J301 Allergic rhinitis due to pollen: Secondary | ICD-10-CM | POA: Diagnosis not present

## 2018-11-17 DIAGNOSIS — J449 Chronic obstructive pulmonary disease, unspecified: Secondary | ICD-10-CM | POA: Diagnosis not present

## 2018-11-17 DIAGNOSIS — F339 Major depressive disorder, recurrent, unspecified: Secondary | ICD-10-CM | POA: Diagnosis not present

## 2018-11-19 DIAGNOSIS — I10 Essential (primary) hypertension: Secondary | ICD-10-CM | POA: Diagnosis not present

## 2018-11-19 DIAGNOSIS — F339 Major depressive disorder, recurrent, unspecified: Secondary | ICD-10-CM | POA: Diagnosis not present

## 2018-11-19 DIAGNOSIS — J301 Allergic rhinitis due to pollen: Secondary | ICD-10-CM | POA: Diagnosis not present

## 2018-11-19 DIAGNOSIS — J449 Chronic obstructive pulmonary disease, unspecified: Secondary | ICD-10-CM | POA: Diagnosis not present

## 2018-11-19 DIAGNOSIS — K219 Gastro-esophageal reflux disease without esophagitis: Secondary | ICD-10-CM | POA: Diagnosis not present

## 2018-11-20 DIAGNOSIS — J301 Allergic rhinitis due to pollen: Secondary | ICD-10-CM | POA: Diagnosis not present

## 2018-11-20 DIAGNOSIS — K219 Gastro-esophageal reflux disease without esophagitis: Secondary | ICD-10-CM | POA: Diagnosis not present

## 2018-11-20 DIAGNOSIS — F339 Major depressive disorder, recurrent, unspecified: Secondary | ICD-10-CM | POA: Diagnosis not present

## 2018-11-20 DIAGNOSIS — J449 Chronic obstructive pulmonary disease, unspecified: Secondary | ICD-10-CM | POA: Diagnosis not present

## 2018-11-20 DIAGNOSIS — I10 Essential (primary) hypertension: Secondary | ICD-10-CM | POA: Diagnosis not present

## 2018-11-26 DIAGNOSIS — F339 Major depressive disorder, recurrent, unspecified: Secondary | ICD-10-CM | POA: Diagnosis not present

## 2018-11-26 DIAGNOSIS — I10 Essential (primary) hypertension: Secondary | ICD-10-CM | POA: Diagnosis not present

## 2018-11-26 DIAGNOSIS — K219 Gastro-esophageal reflux disease without esophagitis: Secondary | ICD-10-CM | POA: Diagnosis not present

## 2018-11-26 DIAGNOSIS — J449 Chronic obstructive pulmonary disease, unspecified: Secondary | ICD-10-CM | POA: Diagnosis not present

## 2018-11-26 DIAGNOSIS — J301 Allergic rhinitis due to pollen: Secondary | ICD-10-CM | POA: Diagnosis not present

## 2018-11-27 DIAGNOSIS — J449 Chronic obstructive pulmonary disease, unspecified: Secondary | ICD-10-CM | POA: Diagnosis not present

## 2018-11-27 DIAGNOSIS — F339 Major depressive disorder, recurrent, unspecified: Secondary | ICD-10-CM | POA: Diagnosis not present

## 2018-11-27 DIAGNOSIS — K219 Gastro-esophageal reflux disease without esophagitis: Secondary | ICD-10-CM | POA: Diagnosis not present

## 2018-11-27 DIAGNOSIS — J301 Allergic rhinitis due to pollen: Secondary | ICD-10-CM | POA: Diagnosis not present

## 2018-11-27 DIAGNOSIS — I10 Essential (primary) hypertension: Secondary | ICD-10-CM | POA: Diagnosis not present

## 2018-11-29 DIAGNOSIS — I10 Essential (primary) hypertension: Secondary | ICD-10-CM | POA: Diagnosis not present

## 2018-11-29 DIAGNOSIS — F339 Major depressive disorder, recurrent, unspecified: Secondary | ICD-10-CM | POA: Diagnosis not present

## 2018-11-29 DIAGNOSIS — J301 Allergic rhinitis due to pollen: Secondary | ICD-10-CM | POA: Diagnosis not present

## 2018-11-29 DIAGNOSIS — K219 Gastro-esophageal reflux disease without esophagitis: Secondary | ICD-10-CM | POA: Diagnosis not present

## 2018-11-29 DIAGNOSIS — J449 Chronic obstructive pulmonary disease, unspecified: Secondary | ICD-10-CM | POA: Diagnosis not present

## 2018-12-01 DIAGNOSIS — I10 Essential (primary) hypertension: Secondary | ICD-10-CM | POA: Diagnosis not present

## 2018-12-01 DIAGNOSIS — F339 Major depressive disorder, recurrent, unspecified: Secondary | ICD-10-CM | POA: Diagnosis not present

## 2018-12-01 DIAGNOSIS — J449 Chronic obstructive pulmonary disease, unspecified: Secondary | ICD-10-CM | POA: Diagnosis not present

## 2018-12-01 DIAGNOSIS — K219 Gastro-esophageal reflux disease without esophagitis: Secondary | ICD-10-CM | POA: Diagnosis not present

## 2018-12-01 DIAGNOSIS — J301 Allergic rhinitis due to pollen: Secondary | ICD-10-CM | POA: Diagnosis not present

## 2018-12-03 DIAGNOSIS — I10 Essential (primary) hypertension: Secondary | ICD-10-CM | POA: Diagnosis not present

## 2018-12-03 DIAGNOSIS — K219 Gastro-esophageal reflux disease without esophagitis: Secondary | ICD-10-CM | POA: Diagnosis not present

## 2018-12-03 DIAGNOSIS — J301 Allergic rhinitis due to pollen: Secondary | ICD-10-CM | POA: Diagnosis not present

## 2018-12-03 DIAGNOSIS — J449 Chronic obstructive pulmonary disease, unspecified: Secondary | ICD-10-CM | POA: Diagnosis not present

## 2018-12-03 DIAGNOSIS — F339 Major depressive disorder, recurrent, unspecified: Secondary | ICD-10-CM | POA: Diagnosis not present

## 2018-12-04 DIAGNOSIS — J449 Chronic obstructive pulmonary disease, unspecified: Secondary | ICD-10-CM | POA: Diagnosis not present

## 2018-12-04 DIAGNOSIS — J301 Allergic rhinitis due to pollen: Secondary | ICD-10-CM | POA: Diagnosis not present

## 2018-12-04 DIAGNOSIS — K219 Gastro-esophageal reflux disease without esophagitis: Secondary | ICD-10-CM | POA: Diagnosis not present

## 2018-12-04 DIAGNOSIS — I10 Essential (primary) hypertension: Secondary | ICD-10-CM | POA: Diagnosis not present

## 2018-12-04 DIAGNOSIS — F339 Major depressive disorder, recurrent, unspecified: Secondary | ICD-10-CM | POA: Diagnosis not present

## 2018-12-08 DIAGNOSIS — J449 Chronic obstructive pulmonary disease, unspecified: Secondary | ICD-10-CM | POA: Diagnosis not present

## 2018-12-08 DIAGNOSIS — I10 Essential (primary) hypertension: Secondary | ICD-10-CM | POA: Diagnosis not present

## 2018-12-08 DIAGNOSIS — J301 Allergic rhinitis due to pollen: Secondary | ICD-10-CM | POA: Diagnosis not present

## 2018-12-08 DIAGNOSIS — F339 Major depressive disorder, recurrent, unspecified: Secondary | ICD-10-CM | POA: Diagnosis not present

## 2018-12-08 DIAGNOSIS — K219 Gastro-esophageal reflux disease without esophagitis: Secondary | ICD-10-CM | POA: Diagnosis not present

## 2018-12-11 DIAGNOSIS — J449 Chronic obstructive pulmonary disease, unspecified: Secondary | ICD-10-CM | POA: Diagnosis not present

## 2018-12-11 DIAGNOSIS — F339 Major depressive disorder, recurrent, unspecified: Secondary | ICD-10-CM | POA: Diagnosis not present

## 2018-12-11 DIAGNOSIS — K219 Gastro-esophageal reflux disease without esophagitis: Secondary | ICD-10-CM | POA: Diagnosis not present

## 2018-12-11 DIAGNOSIS — I10 Essential (primary) hypertension: Secondary | ICD-10-CM | POA: Diagnosis not present

## 2018-12-11 DIAGNOSIS — J301 Allergic rhinitis due to pollen: Secondary | ICD-10-CM | POA: Diagnosis not present

## 2018-12-12 DIAGNOSIS — K219 Gastro-esophageal reflux disease without esophagitis: Secondary | ICD-10-CM | POA: Diagnosis not present

## 2018-12-12 DIAGNOSIS — I10 Essential (primary) hypertension: Secondary | ICD-10-CM | POA: Diagnosis not present

## 2018-12-12 DIAGNOSIS — J449 Chronic obstructive pulmonary disease, unspecified: Secondary | ICD-10-CM | POA: Diagnosis not present

## 2018-12-12 DIAGNOSIS — F339 Major depressive disorder, recurrent, unspecified: Secondary | ICD-10-CM | POA: Diagnosis not present

## 2018-12-12 DIAGNOSIS — J301 Allergic rhinitis due to pollen: Secondary | ICD-10-CM | POA: Diagnosis not present

## 2018-12-15 ENCOUNTER — Telehealth: Payer: Self-pay | Admitting: Pulmonary Disease

## 2018-12-15 DIAGNOSIS — F339 Major depressive disorder, recurrent, unspecified: Secondary | ICD-10-CM | POA: Diagnosis not present

## 2018-12-15 DIAGNOSIS — I10 Essential (primary) hypertension: Secondary | ICD-10-CM | POA: Diagnosis not present

## 2018-12-15 DIAGNOSIS — J449 Chronic obstructive pulmonary disease, unspecified: Secondary | ICD-10-CM | POA: Diagnosis not present

## 2018-12-15 DIAGNOSIS — K219 Gastro-esophageal reflux disease without esophagitis: Secondary | ICD-10-CM | POA: Diagnosis not present

## 2018-12-15 DIAGNOSIS — J301 Allergic rhinitis due to pollen: Secondary | ICD-10-CM | POA: Diagnosis not present

## 2018-12-15 MED ORDER — NYSTATIN 100000 UNIT/ML MT SUSP: OROMUCOSAL | 0 refills | Status: DC

## 2018-12-15 NOTE — Telephone Encounter (Signed)
Call made to [facility] with Hospice, she states the patient has some redness and white patches in her mouth, and soreness under her tongue she thinks this related to the patients inhaler and nebulizer use. Megan RN is requesting diflucan be sent to pharmacy. She states she does not need a call back.   Allergies:  Nitrofurantoin  Rash Low  11/15/2014   Adverse Reactions/Drug Intolerances  Morphine  Other (See Comments) Medium Intolerance 02/24/2009   REACTION: nightmares    Ambien [Zolpidem]  Anxiety Low Intolerance 03/28/2014   Cymbalta [Duloxetine Hcl]         BM please advise. Thanks.

## 2018-12-15 NOTE — Telephone Encounter (Signed)
Spoke with [name] and make her aware of Brian's recs. She verbalized understanding. Medication has been sent in. Nothing further needed at time of call.

## 2018-12-15 NOTE — Telephone Encounter (Signed)
Can place order for:  Nystatin 500,000 units suspension /100,000 units/mL >>>5 mL's every 6 hours for 7 days >>>Try to retain nystatin in mouth as long as possible   Please place the order.  Wyn Quaker, FNP

## 2018-12-16 ENCOUNTER — Other Ambulatory Visit: Payer: Self-pay | Admitting: Pulmonary Disease

## 2018-12-17 ENCOUNTER — Telehealth: Payer: Self-pay | Admitting: Pulmonary Disease

## 2018-12-17 MED ORDER — ALBUTEROL SULFATE HFA 108 (90 BASE) MCG/ACT IN AERS: 2.0000 | INHALATION_SPRAY | Freq: Four times a day (QID) | RESPIRATORY_TRACT | 1 refills | Status: AC | PRN

## 2018-12-17 NOTE — Telephone Encounter (Signed)
Spoke with pt  and advised rx for albuterol inhaler sent to [facility] pharmacy. Nothing further is needed.

## 2018-12-18 ENCOUNTER — Telehealth: Payer: Self-pay | Admitting: Pulmonary Disease

## 2018-12-18 MED ORDER — IPRATROPIUM-ALBUTEROL 0.5-2.5 (3) MG/3ML IN SOLN: RESPIRATORY_TRACT | 3 refills | Status: AC

## 2018-12-18 NOTE — Telephone Encounter (Signed)
Spoke with pt's husband, he stated she needed the duoneb nebulizers sent into the pharmacy. I advised him I would send in the Rx to [facility] [address]. Nothing further is needed.

## 2018-12-22 DIAGNOSIS — J301 Allergic rhinitis due to pollen: Secondary | ICD-10-CM | POA: Diagnosis not present

## 2018-12-22 DIAGNOSIS — J449 Chronic obstructive pulmonary disease, unspecified: Secondary | ICD-10-CM | POA: Diagnosis not present

## 2018-12-22 DIAGNOSIS — I10 Essential (primary) hypertension: Secondary | ICD-10-CM | POA: Diagnosis not present

## 2018-12-22 DIAGNOSIS — K219 Gastro-esophageal reflux disease without esophagitis: Secondary | ICD-10-CM | POA: Diagnosis not present

## 2018-12-22 DIAGNOSIS — F339 Major depressive disorder, recurrent, unspecified: Secondary | ICD-10-CM | POA: Diagnosis not present

## 2018-12-25 DIAGNOSIS — I10 Essential (primary) hypertension: Secondary | ICD-10-CM | POA: Diagnosis not present

## 2018-12-25 DIAGNOSIS — J301 Allergic rhinitis due to pollen: Secondary | ICD-10-CM | POA: Diagnosis not present

## 2018-12-25 DIAGNOSIS — F339 Major depressive disorder, recurrent, unspecified: Secondary | ICD-10-CM | POA: Diagnosis not present

## 2018-12-25 DIAGNOSIS — J449 Chronic obstructive pulmonary disease, unspecified: Secondary | ICD-10-CM | POA: Diagnosis not present

## 2018-12-25 DIAGNOSIS — K219 Gastro-esophageal reflux disease without esophagitis: Secondary | ICD-10-CM | POA: Diagnosis not present

## 2018-12-28 DIAGNOSIS — F339 Major depressive disorder, recurrent, unspecified: Secondary | ICD-10-CM | POA: Diagnosis not present

## 2018-12-28 DIAGNOSIS — I10 Essential (primary) hypertension: Secondary | ICD-10-CM | POA: Diagnosis not present

## 2018-12-28 DIAGNOSIS — K219 Gastro-esophageal reflux disease without esophagitis: Secondary | ICD-10-CM | POA: Diagnosis not present

## 2018-12-28 DIAGNOSIS — J449 Chronic obstructive pulmonary disease, unspecified: Secondary | ICD-10-CM | POA: Diagnosis not present

## 2018-12-28 DIAGNOSIS — J301 Allergic rhinitis due to pollen: Secondary | ICD-10-CM | POA: Diagnosis not present

## 2018-12-29 DIAGNOSIS — I10 Essential (primary) hypertension: Secondary | ICD-10-CM | POA: Diagnosis not present

## 2018-12-29 DIAGNOSIS — K219 Gastro-esophageal reflux disease without esophagitis: Secondary | ICD-10-CM | POA: Diagnosis not present

## 2018-12-29 DIAGNOSIS — J449 Chronic obstructive pulmonary disease, unspecified: Secondary | ICD-10-CM | POA: Diagnosis not present

## 2018-12-29 DIAGNOSIS — J301 Allergic rhinitis due to pollen: Secondary | ICD-10-CM | POA: Diagnosis not present

## 2018-12-29 DIAGNOSIS — F339 Major depressive disorder, recurrent, unspecified: Secondary | ICD-10-CM | POA: Diagnosis not present

## 2019-01-01 DIAGNOSIS — J449 Chronic obstructive pulmonary disease, unspecified: Secondary | ICD-10-CM | POA: Diagnosis not present

## 2019-01-01 DIAGNOSIS — K219 Gastro-esophageal reflux disease without esophagitis: Secondary | ICD-10-CM | POA: Diagnosis not present

## 2019-01-01 DIAGNOSIS — I10 Essential (primary) hypertension: Secondary | ICD-10-CM | POA: Diagnosis not present

## 2019-01-01 DIAGNOSIS — F339 Major depressive disorder, recurrent, unspecified: Secondary | ICD-10-CM | POA: Diagnosis not present

## 2019-01-01 DIAGNOSIS — J301 Allergic rhinitis due to pollen: Secondary | ICD-10-CM | POA: Diagnosis not present

## 2019-01-02 DIAGNOSIS — K219 Gastro-esophageal reflux disease without esophagitis: Secondary | ICD-10-CM | POA: Diagnosis not present

## 2019-01-02 DIAGNOSIS — J301 Allergic rhinitis due to pollen: Secondary | ICD-10-CM | POA: Diagnosis not present

## 2019-01-02 DIAGNOSIS — F339 Major depressive disorder, recurrent, unspecified: Secondary | ICD-10-CM | POA: Diagnosis not present

## 2019-01-02 DIAGNOSIS — I10 Essential (primary) hypertension: Secondary | ICD-10-CM | POA: Diagnosis not present

## 2019-01-02 DIAGNOSIS — J449 Chronic obstructive pulmonary disease, unspecified: Secondary | ICD-10-CM | POA: Diagnosis not present

## 2019-01-05 DIAGNOSIS — K219 Gastro-esophageal reflux disease without esophagitis: Secondary | ICD-10-CM | POA: Diagnosis not present

## 2019-01-05 DIAGNOSIS — J301 Allergic rhinitis due to pollen: Secondary | ICD-10-CM | POA: Diagnosis not present

## 2019-01-05 DIAGNOSIS — I10 Essential (primary) hypertension: Secondary | ICD-10-CM | POA: Diagnosis not present

## 2019-01-05 DIAGNOSIS — J449 Chronic obstructive pulmonary disease, unspecified: Secondary | ICD-10-CM | POA: Diagnosis not present

## 2019-01-05 DIAGNOSIS — F339 Major depressive disorder, recurrent, unspecified: Secondary | ICD-10-CM | POA: Diagnosis not present

## 2019-01-06 ENCOUNTER — Telehealth: Payer: Self-pay | Admitting: Pulmonary Disease

## 2019-01-06 NOTE — Telephone Encounter (Signed)
Megan, RN, with Hospice states the pt was able to take a few steps today with her O2 on at 2.5lpm but her spO2 dropped to the 70's. She normally maintains between 90-95% on 2.5lpm at rest. Jinny Blossom states she isnt able to titrate without the order from the physician. Megan requesting recs from RA. Per Jinny Blossom pt has had a steady decline over the past few weeks.

## 2019-01-06 NOTE — Telephone Encounter (Signed)
Called and spoke with Jinny Blossom who stated she went to see pt yesterday 01/05/2019 and when she evaluated pt, with pt taking only a few steps on her 2.5L O2, pt's sats dropped to the 70's.  Jinny Blossom stated that pt has had a steady decline over the past few weeks.  Dr. Elsworth Soho, please advise recs for pt and hospice RN Megan. Thanks!

## 2019-01-06 NOTE — Telephone Encounter (Signed)
Okay to titrate oxygen to maintain saturation 88% If she is wheezing, start  Prednisone 10 mg tabs Take 4 tabs  daily with food x 4 days, then 3 tabs daily x 4 days, then 2 tabs daily x 4 days, then 1 tab daily x4 days then stop. #40

## 2019-01-06 NOTE — Telephone Encounter (Signed)
Called patient unable to reach x2 [facility]

## 2019-01-08 DIAGNOSIS — K219 Gastro-esophageal reflux disease without esophagitis: Secondary | ICD-10-CM | POA: Diagnosis not present

## 2019-01-08 DIAGNOSIS — F339 Major depressive disorder, recurrent, unspecified: Secondary | ICD-10-CM | POA: Diagnosis not present

## 2019-01-08 DIAGNOSIS — I10 Essential (primary) hypertension: Secondary | ICD-10-CM | POA: Diagnosis not present

## 2019-01-08 DIAGNOSIS — J449 Chronic obstructive pulmonary disease, unspecified: Secondary | ICD-10-CM | POA: Diagnosis not present

## 2019-01-08 DIAGNOSIS — J301 Allergic rhinitis due to pollen: Secondary | ICD-10-CM | POA: Diagnosis not present

## 2019-01-09 MED ORDER — PREDNISONE 10 MG PO TABS: ORAL_TABLET | ORAL | 0 refills | Status: DC

## 2019-01-09 NOTE — Telephone Encounter (Signed)
Spoke with pt's husband and advised him message from RA. He agreed to send in Prednisone for pt. Rx sent to [facility] on [address]. Nothing further is needed.

## 2019-01-12 DIAGNOSIS — K219 Gastro-esophageal reflux disease without esophagitis: Secondary | ICD-10-CM | POA: Diagnosis not present

## 2019-01-12 DIAGNOSIS — F339 Major depressive disorder, recurrent, unspecified: Secondary | ICD-10-CM | POA: Diagnosis not present

## 2019-01-12 DIAGNOSIS — J301 Allergic rhinitis due to pollen: Secondary | ICD-10-CM | POA: Diagnosis not present

## 2019-01-12 DIAGNOSIS — J449 Chronic obstructive pulmonary disease, unspecified: Secondary | ICD-10-CM | POA: Diagnosis not present

## 2019-01-12 DIAGNOSIS — I10 Essential (primary) hypertension: Secondary | ICD-10-CM | POA: Diagnosis not present

## 2019-01-15 DIAGNOSIS — K219 Gastro-esophageal reflux disease without esophagitis: Secondary | ICD-10-CM | POA: Diagnosis not present

## 2019-01-15 DIAGNOSIS — I10 Essential (primary) hypertension: Secondary | ICD-10-CM | POA: Diagnosis not present

## 2019-01-15 DIAGNOSIS — J301 Allergic rhinitis due to pollen: Secondary | ICD-10-CM | POA: Diagnosis not present

## 2019-01-15 DIAGNOSIS — F339 Major depressive disorder, recurrent, unspecified: Secondary | ICD-10-CM | POA: Diagnosis not present

## 2019-01-15 DIAGNOSIS — J449 Chronic obstructive pulmonary disease, unspecified: Secondary | ICD-10-CM | POA: Diagnosis not present

## 2019-01-20 DIAGNOSIS — J301 Allergic rhinitis due to pollen: Secondary | ICD-10-CM | POA: Diagnosis not present

## 2019-01-20 DIAGNOSIS — K219 Gastro-esophageal reflux disease without esophagitis: Secondary | ICD-10-CM | POA: Diagnosis not present

## 2019-01-20 DIAGNOSIS — F339 Major depressive disorder, recurrent, unspecified: Secondary | ICD-10-CM | POA: Diagnosis not present

## 2019-01-20 DIAGNOSIS — J449 Chronic obstructive pulmonary disease, unspecified: Secondary | ICD-10-CM | POA: Diagnosis not present

## 2019-01-20 DIAGNOSIS — I10 Essential (primary) hypertension: Secondary | ICD-10-CM | POA: Diagnosis not present

## 2019-01-22 DIAGNOSIS — J449 Chronic obstructive pulmonary disease, unspecified: Secondary | ICD-10-CM | POA: Diagnosis not present

## 2019-01-22 DIAGNOSIS — J301 Allergic rhinitis due to pollen: Secondary | ICD-10-CM | POA: Diagnosis not present

## 2019-01-22 DIAGNOSIS — K219 Gastro-esophageal reflux disease without esophagitis: Secondary | ICD-10-CM | POA: Diagnosis not present

## 2019-01-22 DIAGNOSIS — I10 Essential (primary) hypertension: Secondary | ICD-10-CM | POA: Diagnosis not present

## 2019-01-22 DIAGNOSIS — F339 Major depressive disorder, recurrent, unspecified: Secondary | ICD-10-CM | POA: Diagnosis not present

## 2019-01-26 DIAGNOSIS — K219 Gastro-esophageal reflux disease without esophagitis: Secondary | ICD-10-CM | POA: Diagnosis not present

## 2019-01-26 DIAGNOSIS — J301 Allergic rhinitis due to pollen: Secondary | ICD-10-CM | POA: Diagnosis not present

## 2019-01-26 DIAGNOSIS — J449 Chronic obstructive pulmonary disease, unspecified: Secondary | ICD-10-CM | POA: Diagnosis not present

## 2019-01-26 DIAGNOSIS — I10 Essential (primary) hypertension: Secondary | ICD-10-CM | POA: Diagnosis not present

## 2019-01-26 DIAGNOSIS — F339 Major depressive disorder, recurrent, unspecified: Secondary | ICD-10-CM | POA: Diagnosis not present

## 2019-01-28 DIAGNOSIS — F339 Major depressive disorder, recurrent, unspecified: Secondary | ICD-10-CM | POA: Diagnosis not present

## 2019-01-28 DIAGNOSIS — I10 Essential (primary) hypertension: Secondary | ICD-10-CM | POA: Diagnosis not present

## 2019-01-28 DIAGNOSIS — J301 Allergic rhinitis due to pollen: Secondary | ICD-10-CM | POA: Diagnosis not present

## 2019-01-28 DIAGNOSIS — K219 Gastro-esophageal reflux disease without esophagitis: Secondary | ICD-10-CM | POA: Diagnosis not present

## 2019-01-28 DIAGNOSIS — J449 Chronic obstructive pulmonary disease, unspecified: Secondary | ICD-10-CM | POA: Diagnosis not present

## 2019-02-02 ENCOUNTER — Telehealth: Payer: Self-pay | Admitting: Pulmonary Disease

## 2019-02-02 DIAGNOSIS — J449 Chronic obstructive pulmonary disease, unspecified: Secondary | ICD-10-CM | POA: Diagnosis not present

## 2019-02-02 DIAGNOSIS — J301 Allergic rhinitis due to pollen: Secondary | ICD-10-CM | POA: Diagnosis not present

## 2019-02-02 DIAGNOSIS — F339 Major depressive disorder, recurrent, unspecified: Secondary | ICD-10-CM | POA: Diagnosis not present

## 2019-02-02 DIAGNOSIS — I10 Essential (primary) hypertension: Secondary | ICD-10-CM | POA: Diagnosis not present

## 2019-02-02 DIAGNOSIS — K219 Gastro-esophageal reflux disease without esophagitis: Secondary | ICD-10-CM | POA: Diagnosis not present

## 2019-02-02 NOTE — Telephone Encounter (Signed)
Left message for Hospice to call back.

## 2019-02-03 NOTE — Telephone Encounter (Signed)
Literally just received this to sign today.  Order was placed on 12/15/2018.  Unsure why it took so long as it is 02/03/2019.  I have signed the form and handed it back to [employer].  [place], FNP

## 2019-02-03 NOTE — Telephone Encounter (Signed)
I have never seen this patient and know nothing about this.   Please route to APP of the day or follow up with [facility] as she is NOT waiting for [place].   Aaron Edelman

## 2019-02-03 NOTE — Telephone Encounter (Signed)
Spoke with [place] and she stated that she was waiting on a signature from [place] for a supplemental order. She is going to re-fax the form. Will await fax. Aaron Edelman please look out for this to come your way.

## 2019-02-03 NOTE — Telephone Encounter (Signed)
On 12/15/18 call came in from [facility] who stated patient was getting thrush from inhalers.  Can place order for:  Nystatin 500,000 units suspension /100,000 units/mL >>>5 mL's every 6 hours for 7 days >>>Try to retain nystatin in mouth as long as possible   Please place the order.  Wyn Quaker, FNP

## 2019-02-03 NOTE — Telephone Encounter (Signed)
ibuprofen called Shabonne but she didn't answer. I left a message for her to call back.

## 2019-02-03 NOTE — Telephone Encounter (Signed)
Called [place] back and confirmed form had been received. She says they have it. Nothing further needed.

## 2019-02-18 ENCOUNTER — Telehealth: Payer: Self-pay | Admitting: Pulmonary Disease

## 2019-02-18 NOTE — Telephone Encounter (Signed)
Checked with Cherina to see if fax has been received from [place] with Hospice on pt and Cherina stated that nothing has been received yet.  Called and spoke with [facility] letting her know that we still have not received the fax and Lockie Mola stated she would refax it to our office. I made sure that Lockie Mola had our correct fax number and she did. Lockie Mola is going to put in attn Dr. Elsworth Soho when she refaxes the order.  Routing to [place] for her to follow up on.

## 2019-02-20 NOTE — Telephone Encounter (Signed)
Left message with shavonne as hospice asking the form be re-faxed.

## 2019-02-23 NOTE — Telephone Encounter (Signed)
Of course!

## 2019-02-23 NOTE — Telephone Encounter (Signed)
Per communication with Benetta Spar, confirmed the forms received have been given to Parke Poisson so Tammy Parrett can sign off.

## 2019-02-23 NOTE — Telephone Encounter (Signed)
Received forms from Hospice.   TP, would you mind signing on behalf of RA since he is not here?

## 2019-02-23 NOTE — Telephone Encounter (Signed)
[name] call back for an update. [name] requesting TP to put on order that Dr. Elsworth Soho is out of office and she is signing in his place. Fax number [phone]  Cb is [phone]

## 2019-02-27 DIAGNOSIS — F329 Major depressive disorder, single episode, unspecified: Secondary | ICD-10-CM | POA: Diagnosis not present

## 2019-02-27 DIAGNOSIS — K219 Gastro-esophageal reflux disease without esophagitis: Secondary | ICD-10-CM | POA: Diagnosis not present

## 2019-02-27 DIAGNOSIS — J449 Chronic obstructive pulmonary disease, unspecified: Secondary | ICD-10-CM | POA: Diagnosis not present

## 2019-02-27 DIAGNOSIS — I10 Essential (primary) hypertension: Secondary | ICD-10-CM | POA: Diagnosis not present

## 2019-02-27 DIAGNOSIS — J302 Other seasonal allergic rhinitis: Secondary | ICD-10-CM | POA: Diagnosis not present

## 2019-03-17 DIAGNOSIS — I10 Essential (primary) hypertension: Secondary | ICD-10-CM | POA: Diagnosis not present

## 2019-03-17 DIAGNOSIS — K219 Gastro-esophageal reflux disease without esophagitis: Secondary | ICD-10-CM | POA: Diagnosis not present

## 2019-03-17 DIAGNOSIS — F329 Major depressive disorder, single episode, unspecified: Secondary | ICD-10-CM | POA: Diagnosis not present

## 2019-03-17 DIAGNOSIS — J302 Other seasonal allergic rhinitis: Secondary | ICD-10-CM | POA: Diagnosis not present

## 2019-03-17 DIAGNOSIS — J449 Chronic obstructive pulmonary disease, unspecified: Secondary | ICD-10-CM | POA: Diagnosis not present

## 2019-03-24 DIAGNOSIS — F329 Major depressive disorder, single episode, unspecified: Secondary | ICD-10-CM | POA: Diagnosis not present

## 2019-03-24 DIAGNOSIS — K219 Gastro-esophageal reflux disease without esophagitis: Secondary | ICD-10-CM | POA: Diagnosis not present

## 2019-03-24 DIAGNOSIS — I10 Essential (primary) hypertension: Secondary | ICD-10-CM | POA: Diagnosis not present

## 2019-03-24 DIAGNOSIS — J449 Chronic obstructive pulmonary disease, unspecified: Secondary | ICD-10-CM | POA: Diagnosis not present

## 2019-03-24 DIAGNOSIS — J302 Other seasonal allergic rhinitis: Secondary | ICD-10-CM | POA: Diagnosis not present

## 2019-03-26 DIAGNOSIS — I10 Essential (primary) hypertension: Secondary | ICD-10-CM | POA: Diagnosis not present

## 2019-03-26 DIAGNOSIS — K219 Gastro-esophageal reflux disease without esophagitis: Secondary | ICD-10-CM | POA: Diagnosis not present

## 2019-03-26 DIAGNOSIS — J449 Chronic obstructive pulmonary disease, unspecified: Secondary | ICD-10-CM | POA: Diagnosis not present

## 2019-03-26 DIAGNOSIS — F329 Major depressive disorder, single episode, unspecified: Secondary | ICD-10-CM | POA: Diagnosis not present

## 2019-03-26 DIAGNOSIS — J302 Other seasonal allergic rhinitis: Secondary | ICD-10-CM | POA: Diagnosis not present

## 2019-03-30 DIAGNOSIS — J302 Other seasonal allergic rhinitis: Secondary | ICD-10-CM | POA: Diagnosis not present

## 2019-03-30 DIAGNOSIS — J449 Chronic obstructive pulmonary disease, unspecified: Secondary | ICD-10-CM | POA: Diagnosis not present

## 2019-03-30 DIAGNOSIS — F329 Major depressive disorder, single episode, unspecified: Secondary | ICD-10-CM | POA: Diagnosis not present

## 2019-03-30 DIAGNOSIS — K219 Gastro-esophageal reflux disease without esophagitis: Secondary | ICD-10-CM | POA: Diagnosis not present

## 2019-03-30 DIAGNOSIS — I10 Essential (primary) hypertension: Secondary | ICD-10-CM | POA: Diagnosis not present

## 2019-04-01 DIAGNOSIS — J302 Other seasonal allergic rhinitis: Secondary | ICD-10-CM | POA: Diagnosis not present

## 2019-04-01 DIAGNOSIS — J449 Chronic obstructive pulmonary disease, unspecified: Secondary | ICD-10-CM | POA: Diagnosis not present

## 2019-04-01 DIAGNOSIS — I10 Essential (primary) hypertension: Secondary | ICD-10-CM | POA: Diagnosis not present

## 2019-04-01 DIAGNOSIS — K219 Gastro-esophageal reflux disease without esophagitis: Secondary | ICD-10-CM | POA: Diagnosis not present

## 2019-04-01 DIAGNOSIS — F329 Major depressive disorder, single episode, unspecified: Secondary | ICD-10-CM | POA: Diagnosis not present

## 2019-04-02 DIAGNOSIS — J302 Other seasonal allergic rhinitis: Secondary | ICD-10-CM | POA: Diagnosis not present

## 2019-04-02 DIAGNOSIS — K219 Gastro-esophageal reflux disease without esophagitis: Secondary | ICD-10-CM | POA: Diagnosis not present

## 2019-04-02 DIAGNOSIS — J449 Chronic obstructive pulmonary disease, unspecified: Secondary | ICD-10-CM | POA: Diagnosis not present

## 2019-04-02 DIAGNOSIS — I10 Essential (primary) hypertension: Secondary | ICD-10-CM | POA: Diagnosis not present

## 2019-04-02 DIAGNOSIS — F329 Major depressive disorder, single episode, unspecified: Secondary | ICD-10-CM | POA: Diagnosis not present

## 2019-04-06 DIAGNOSIS — I10 Essential (primary) hypertension: Secondary | ICD-10-CM | POA: Diagnosis not present

## 2019-04-06 DIAGNOSIS — J302 Other seasonal allergic rhinitis: Secondary | ICD-10-CM | POA: Diagnosis not present

## 2019-04-06 DIAGNOSIS — K219 Gastro-esophageal reflux disease without esophagitis: Secondary | ICD-10-CM | POA: Diagnosis not present

## 2019-04-06 DIAGNOSIS — J449 Chronic obstructive pulmonary disease, unspecified: Secondary | ICD-10-CM | POA: Diagnosis not present

## 2019-04-06 DIAGNOSIS — F329 Major depressive disorder, single episode, unspecified: Secondary | ICD-10-CM | POA: Diagnosis not present

## 2019-04-13 DIAGNOSIS — I10 Essential (primary) hypertension: Secondary | ICD-10-CM | POA: Diagnosis not present

## 2019-04-13 DIAGNOSIS — J302 Other seasonal allergic rhinitis: Secondary | ICD-10-CM | POA: Diagnosis not present

## 2019-04-13 DIAGNOSIS — J449 Chronic obstructive pulmonary disease, unspecified: Secondary | ICD-10-CM | POA: Diagnosis not present

## 2019-04-13 DIAGNOSIS — K219 Gastro-esophageal reflux disease without esophagitis: Secondary | ICD-10-CM | POA: Diagnosis not present

## 2019-04-13 DIAGNOSIS — F329 Major depressive disorder, single episode, unspecified: Secondary | ICD-10-CM | POA: Diagnosis not present

## 2019-04-17 DIAGNOSIS — J449 Chronic obstructive pulmonary disease, unspecified: Secondary | ICD-10-CM | POA: Diagnosis not present

## 2019-04-17 DIAGNOSIS — J302 Other seasonal allergic rhinitis: Secondary | ICD-10-CM | POA: Diagnosis not present

## 2019-04-17 DIAGNOSIS — F329 Major depressive disorder, single episode, unspecified: Secondary | ICD-10-CM | POA: Diagnosis not present

## 2019-04-17 DIAGNOSIS — I10 Essential (primary) hypertension: Secondary | ICD-10-CM | POA: Diagnosis not present

## 2019-04-17 DIAGNOSIS — K219 Gastro-esophageal reflux disease without esophagitis: Secondary | ICD-10-CM | POA: Diagnosis not present

## 2019-04-18 DIAGNOSIS — J302 Other seasonal allergic rhinitis: Secondary | ICD-10-CM | POA: Diagnosis not present

## 2019-04-18 DIAGNOSIS — F329 Major depressive disorder, single episode, unspecified: Secondary | ICD-10-CM | POA: Diagnosis not present

## 2019-04-18 DIAGNOSIS — K219 Gastro-esophageal reflux disease without esophagitis: Secondary | ICD-10-CM | POA: Diagnosis not present

## 2019-04-18 DIAGNOSIS — J449 Chronic obstructive pulmonary disease, unspecified: Secondary | ICD-10-CM | POA: Diagnosis not present

## 2019-04-18 DIAGNOSIS — I10 Essential (primary) hypertension: Secondary | ICD-10-CM | POA: Diagnosis not present

## 2019-04-20 DIAGNOSIS — F329 Major depressive disorder, single episode, unspecified: Secondary | ICD-10-CM | POA: Diagnosis not present

## 2019-04-20 DIAGNOSIS — K219 Gastro-esophageal reflux disease without esophagitis: Secondary | ICD-10-CM | POA: Diagnosis not present

## 2019-04-20 DIAGNOSIS — J302 Other seasonal allergic rhinitis: Secondary | ICD-10-CM | POA: Diagnosis not present

## 2019-04-20 DIAGNOSIS — J449 Chronic obstructive pulmonary disease, unspecified: Secondary | ICD-10-CM | POA: Diagnosis not present

## 2019-04-20 DIAGNOSIS — I10 Essential (primary) hypertension: Secondary | ICD-10-CM | POA: Diagnosis not present

## 2019-04-27 DIAGNOSIS — K219 Gastro-esophageal reflux disease without esophagitis: Secondary | ICD-10-CM | POA: Diagnosis not present

## 2019-04-27 DIAGNOSIS — J302 Other seasonal allergic rhinitis: Secondary | ICD-10-CM | POA: Diagnosis not present

## 2019-04-27 DIAGNOSIS — J449 Chronic obstructive pulmonary disease, unspecified: Secondary | ICD-10-CM | POA: Diagnosis not present

## 2019-04-27 DIAGNOSIS — F329 Major depressive disorder, single episode, unspecified: Secondary | ICD-10-CM | POA: Diagnosis not present

## 2019-04-27 DIAGNOSIS — I10 Essential (primary) hypertension: Secondary | ICD-10-CM | POA: Diagnosis not present

## 2019-04-29 DIAGNOSIS — F329 Major depressive disorder, single episode, unspecified: Secondary | ICD-10-CM | POA: Diagnosis not present

## 2019-04-29 DIAGNOSIS — J449 Chronic obstructive pulmonary disease, unspecified: Secondary | ICD-10-CM | POA: Diagnosis not present

## 2019-04-29 DIAGNOSIS — K219 Gastro-esophageal reflux disease without esophagitis: Secondary | ICD-10-CM | POA: Diagnosis not present

## 2019-04-29 DIAGNOSIS — W19XXXD Unspecified fall, subsequent encounter: Secondary | ICD-10-CM | POA: Diagnosis not present

## 2019-04-29 DIAGNOSIS — I10 Essential (primary) hypertension: Secondary | ICD-10-CM | POA: Diagnosis not present

## 2019-04-29 DIAGNOSIS — J302 Other seasonal allergic rhinitis: Secondary | ICD-10-CM | POA: Diagnosis not present

## 2019-04-30 DIAGNOSIS — K219 Gastro-esophageal reflux disease without esophagitis: Secondary | ICD-10-CM | POA: Diagnosis not present

## 2019-04-30 DIAGNOSIS — I10 Essential (primary) hypertension: Secondary | ICD-10-CM | POA: Diagnosis not present

## 2019-04-30 DIAGNOSIS — F329 Major depressive disorder, single episode, unspecified: Secondary | ICD-10-CM | POA: Diagnosis not present

## 2019-04-30 DIAGNOSIS — J449 Chronic obstructive pulmonary disease, unspecified: Secondary | ICD-10-CM | POA: Diagnosis not present

## 2019-04-30 DIAGNOSIS — W19XXXD Unspecified fall, subsequent encounter: Secondary | ICD-10-CM | POA: Diagnosis not present

## 2019-04-30 DIAGNOSIS — J302 Other seasonal allergic rhinitis: Secondary | ICD-10-CM | POA: Diagnosis not present

## 2019-05-01 DIAGNOSIS — K219 Gastro-esophageal reflux disease without esophagitis: Secondary | ICD-10-CM | POA: Diagnosis not present

## 2019-05-01 DIAGNOSIS — J302 Other seasonal allergic rhinitis: Secondary | ICD-10-CM | POA: Diagnosis not present

## 2019-05-01 DIAGNOSIS — I10 Essential (primary) hypertension: Secondary | ICD-10-CM | POA: Diagnosis not present

## 2019-05-01 DIAGNOSIS — J449 Chronic obstructive pulmonary disease, unspecified: Secondary | ICD-10-CM | POA: Diagnosis not present

## 2019-05-01 DIAGNOSIS — W19XXXD Unspecified fall, subsequent encounter: Secondary | ICD-10-CM | POA: Diagnosis not present

## 2019-05-01 DIAGNOSIS — F329 Major depressive disorder, single episode, unspecified: Secondary | ICD-10-CM | POA: Diagnosis not present

## 2019-05-04 DIAGNOSIS — W19XXXD Unspecified fall, subsequent encounter: Secondary | ICD-10-CM | POA: Diagnosis not present

## 2019-05-04 DIAGNOSIS — I10 Essential (primary) hypertension: Secondary | ICD-10-CM | POA: Diagnosis not present

## 2019-05-04 DIAGNOSIS — K219 Gastro-esophageal reflux disease without esophagitis: Secondary | ICD-10-CM | POA: Diagnosis not present

## 2019-05-04 DIAGNOSIS — F329 Major depressive disorder, single episode, unspecified: Secondary | ICD-10-CM | POA: Diagnosis not present

## 2019-05-04 DIAGNOSIS — J302 Other seasonal allergic rhinitis: Secondary | ICD-10-CM | POA: Diagnosis not present

## 2019-05-04 DIAGNOSIS — J449 Chronic obstructive pulmonary disease, unspecified: Secondary | ICD-10-CM | POA: Diagnosis not present

## 2019-05-06 DIAGNOSIS — J449 Chronic obstructive pulmonary disease, unspecified: Secondary | ICD-10-CM | POA: Diagnosis not present

## 2019-05-06 DIAGNOSIS — J302 Other seasonal allergic rhinitis: Secondary | ICD-10-CM | POA: Diagnosis not present

## 2019-05-06 DIAGNOSIS — W19XXXD Unspecified fall, subsequent encounter: Secondary | ICD-10-CM | POA: Diagnosis not present

## 2019-05-06 DIAGNOSIS — I10 Essential (primary) hypertension: Secondary | ICD-10-CM | POA: Diagnosis not present

## 2019-05-06 DIAGNOSIS — K219 Gastro-esophageal reflux disease without esophagitis: Secondary | ICD-10-CM | POA: Diagnosis not present

## 2019-05-06 DIAGNOSIS — F329 Major depressive disorder, single episode, unspecified: Secondary | ICD-10-CM | POA: Diagnosis not present

## 2019-05-07 DIAGNOSIS — I10 Essential (primary) hypertension: Secondary | ICD-10-CM | POA: Diagnosis not present

## 2019-05-07 DIAGNOSIS — W19XXXD Unspecified fall, subsequent encounter: Secondary | ICD-10-CM | POA: Diagnosis not present

## 2019-05-07 DIAGNOSIS — J449 Chronic obstructive pulmonary disease, unspecified: Secondary | ICD-10-CM | POA: Diagnosis not present

## 2019-05-07 DIAGNOSIS — F329 Major depressive disorder, single episode, unspecified: Secondary | ICD-10-CM | POA: Diagnosis not present

## 2019-05-07 DIAGNOSIS — K219 Gastro-esophageal reflux disease without esophagitis: Secondary | ICD-10-CM | POA: Diagnosis not present

## 2019-05-07 DIAGNOSIS — J302 Other seasonal allergic rhinitis: Secondary | ICD-10-CM | POA: Diagnosis not present

## 2019-05-11 DIAGNOSIS — W19XXXD Unspecified fall, subsequent encounter: Secondary | ICD-10-CM | POA: Diagnosis not present

## 2019-05-11 DIAGNOSIS — F329 Major depressive disorder, single episode, unspecified: Secondary | ICD-10-CM | POA: Diagnosis not present

## 2019-05-11 DIAGNOSIS — J449 Chronic obstructive pulmonary disease, unspecified: Secondary | ICD-10-CM | POA: Diagnosis not present

## 2019-05-11 DIAGNOSIS — I10 Essential (primary) hypertension: Secondary | ICD-10-CM | POA: Diagnosis not present

## 2019-05-11 DIAGNOSIS — K219 Gastro-esophageal reflux disease without esophagitis: Secondary | ICD-10-CM | POA: Diagnosis not present

## 2019-05-11 DIAGNOSIS — J302 Other seasonal allergic rhinitis: Secondary | ICD-10-CM | POA: Diagnosis not present

## 2019-05-14 DIAGNOSIS — W19XXXD Unspecified fall, subsequent encounter: Secondary | ICD-10-CM | POA: Diagnosis not present

## 2019-05-14 DIAGNOSIS — J302 Other seasonal allergic rhinitis: Secondary | ICD-10-CM | POA: Diagnosis not present

## 2019-05-14 DIAGNOSIS — J449 Chronic obstructive pulmonary disease, unspecified: Secondary | ICD-10-CM | POA: Diagnosis not present

## 2019-05-14 DIAGNOSIS — K219 Gastro-esophageal reflux disease without esophagitis: Secondary | ICD-10-CM | POA: Diagnosis not present

## 2019-05-14 DIAGNOSIS — I10 Essential (primary) hypertension: Secondary | ICD-10-CM | POA: Diagnosis not present

## 2019-05-14 DIAGNOSIS — F329 Major depressive disorder, single episode, unspecified: Secondary | ICD-10-CM | POA: Diagnosis not present

## 2019-05-18 DIAGNOSIS — J302 Other seasonal allergic rhinitis: Secondary | ICD-10-CM | POA: Diagnosis not present

## 2019-05-18 DIAGNOSIS — F329 Major depressive disorder, single episode, unspecified: Secondary | ICD-10-CM | POA: Diagnosis not present

## 2019-05-18 DIAGNOSIS — K219 Gastro-esophageal reflux disease without esophagitis: Secondary | ICD-10-CM | POA: Diagnosis not present

## 2019-05-18 DIAGNOSIS — W19XXXD Unspecified fall, subsequent encounter: Secondary | ICD-10-CM | POA: Diagnosis not present

## 2019-05-18 DIAGNOSIS — I10 Essential (primary) hypertension: Secondary | ICD-10-CM | POA: Diagnosis not present

## 2019-05-18 DIAGNOSIS — J449 Chronic obstructive pulmonary disease, unspecified: Secondary | ICD-10-CM | POA: Diagnosis not present

## 2019-05-21 DIAGNOSIS — J302 Other seasonal allergic rhinitis: Secondary | ICD-10-CM | POA: Diagnosis not present

## 2019-05-21 DIAGNOSIS — I10 Essential (primary) hypertension: Secondary | ICD-10-CM | POA: Diagnosis not present

## 2019-05-21 DIAGNOSIS — K219 Gastro-esophageal reflux disease without esophagitis: Secondary | ICD-10-CM | POA: Diagnosis not present

## 2019-05-21 DIAGNOSIS — J449 Chronic obstructive pulmonary disease, unspecified: Secondary | ICD-10-CM | POA: Diagnosis not present

## 2019-05-21 DIAGNOSIS — W19XXXD Unspecified fall, subsequent encounter: Secondary | ICD-10-CM | POA: Diagnosis not present

## 2019-05-21 DIAGNOSIS — F329 Major depressive disorder, single episode, unspecified: Secondary | ICD-10-CM | POA: Diagnosis not present

## 2019-05-22 DIAGNOSIS — F329 Major depressive disorder, single episode, unspecified: Secondary | ICD-10-CM | POA: Diagnosis not present

## 2019-05-22 DIAGNOSIS — J449 Chronic obstructive pulmonary disease, unspecified: Secondary | ICD-10-CM | POA: Diagnosis not present

## 2019-05-22 DIAGNOSIS — K219 Gastro-esophageal reflux disease without esophagitis: Secondary | ICD-10-CM | POA: Diagnosis not present

## 2019-05-22 DIAGNOSIS — I10 Essential (primary) hypertension: Secondary | ICD-10-CM | POA: Diagnosis not present

## 2019-05-22 DIAGNOSIS — W19XXXD Unspecified fall, subsequent encounter: Secondary | ICD-10-CM | POA: Diagnosis not present

## 2019-05-22 DIAGNOSIS — J302 Other seasonal allergic rhinitis: Secondary | ICD-10-CM | POA: Diagnosis not present

## 2019-05-25 DIAGNOSIS — I10 Essential (primary) hypertension: Secondary | ICD-10-CM | POA: Diagnosis not present

## 2019-05-25 DIAGNOSIS — W19XXXD Unspecified fall, subsequent encounter: Secondary | ICD-10-CM | POA: Diagnosis not present

## 2019-05-25 DIAGNOSIS — K219 Gastro-esophageal reflux disease without esophagitis: Secondary | ICD-10-CM | POA: Diagnosis not present

## 2019-05-25 DIAGNOSIS — J302 Other seasonal allergic rhinitis: Secondary | ICD-10-CM | POA: Diagnosis not present

## 2019-05-25 DIAGNOSIS — J449 Chronic obstructive pulmonary disease, unspecified: Secondary | ICD-10-CM | POA: Diagnosis not present

## 2019-05-25 DIAGNOSIS — F329 Major depressive disorder, single episode, unspecified: Secondary | ICD-10-CM | POA: Diagnosis not present

## 2019-05-28 DIAGNOSIS — K219 Gastro-esophageal reflux disease without esophagitis: Secondary | ICD-10-CM | POA: Diagnosis not present

## 2019-05-28 DIAGNOSIS — F329 Major depressive disorder, single episode, unspecified: Secondary | ICD-10-CM | POA: Diagnosis not present

## 2019-05-28 DIAGNOSIS — I10 Essential (primary) hypertension: Secondary | ICD-10-CM | POA: Diagnosis not present

## 2019-05-28 DIAGNOSIS — J449 Chronic obstructive pulmonary disease, unspecified: Secondary | ICD-10-CM | POA: Diagnosis not present

## 2019-05-28 DIAGNOSIS — J302 Other seasonal allergic rhinitis: Secondary | ICD-10-CM | POA: Diagnosis not present

## 2019-05-28 DIAGNOSIS — W19XXXD Unspecified fall, subsequent encounter: Secondary | ICD-10-CM | POA: Diagnosis not present

## 2019-05-30 DIAGNOSIS — J449 Chronic obstructive pulmonary disease, unspecified: Secondary | ICD-10-CM | POA: Diagnosis not present

## 2019-05-30 DIAGNOSIS — F329 Major depressive disorder, single episode, unspecified: Secondary | ICD-10-CM | POA: Diagnosis not present

## 2019-05-30 DIAGNOSIS — I10 Essential (primary) hypertension: Secondary | ICD-10-CM | POA: Diagnosis not present

## 2019-05-30 DIAGNOSIS — J302 Other seasonal allergic rhinitis: Secondary | ICD-10-CM | POA: Diagnosis not present

## 2019-05-30 DIAGNOSIS — W19XXXD Unspecified fall, subsequent encounter: Secondary | ICD-10-CM | POA: Diagnosis not present

## 2019-05-30 DIAGNOSIS — K219 Gastro-esophageal reflux disease without esophagitis: Secondary | ICD-10-CM | POA: Diagnosis not present

## 2019-06-01 DIAGNOSIS — W19XXXD Unspecified fall, subsequent encounter: Secondary | ICD-10-CM | POA: Diagnosis not present

## 2019-06-01 DIAGNOSIS — K219 Gastro-esophageal reflux disease without esophagitis: Secondary | ICD-10-CM | POA: Diagnosis not present

## 2019-06-01 DIAGNOSIS — J449 Chronic obstructive pulmonary disease, unspecified: Secondary | ICD-10-CM | POA: Diagnosis not present

## 2019-06-01 DIAGNOSIS — J302 Other seasonal allergic rhinitis: Secondary | ICD-10-CM | POA: Diagnosis not present

## 2019-06-01 DIAGNOSIS — I10 Essential (primary) hypertension: Secondary | ICD-10-CM | POA: Diagnosis not present

## 2019-06-01 DIAGNOSIS — F329 Major depressive disorder, single episode, unspecified: Secondary | ICD-10-CM | POA: Diagnosis not present

## 2019-06-03 DIAGNOSIS — W19XXXD Unspecified fall, subsequent encounter: Secondary | ICD-10-CM | POA: Diagnosis not present

## 2019-06-03 DIAGNOSIS — J302 Other seasonal allergic rhinitis: Secondary | ICD-10-CM | POA: Diagnosis not present

## 2019-06-03 DIAGNOSIS — K219 Gastro-esophageal reflux disease without esophagitis: Secondary | ICD-10-CM | POA: Diagnosis not present

## 2019-06-03 DIAGNOSIS — J449 Chronic obstructive pulmonary disease, unspecified: Secondary | ICD-10-CM | POA: Diagnosis not present

## 2019-06-03 DIAGNOSIS — I10 Essential (primary) hypertension: Secondary | ICD-10-CM | POA: Diagnosis not present

## 2019-06-03 DIAGNOSIS — F329 Major depressive disorder, single episode, unspecified: Secondary | ICD-10-CM | POA: Diagnosis not present

## 2019-06-11 DIAGNOSIS — J302 Other seasonal allergic rhinitis: Secondary | ICD-10-CM | POA: Diagnosis not present

## 2019-06-11 DIAGNOSIS — W19XXXD Unspecified fall, subsequent encounter: Secondary | ICD-10-CM | POA: Diagnosis not present

## 2019-06-11 DIAGNOSIS — F329 Major depressive disorder, single episode, unspecified: Secondary | ICD-10-CM | POA: Diagnosis not present

## 2019-06-11 DIAGNOSIS — K219 Gastro-esophageal reflux disease without esophagitis: Secondary | ICD-10-CM | POA: Diagnosis not present

## 2019-06-11 DIAGNOSIS — J449 Chronic obstructive pulmonary disease, unspecified: Secondary | ICD-10-CM | POA: Diagnosis not present

## 2019-06-11 DIAGNOSIS — I10 Essential (primary) hypertension: Secondary | ICD-10-CM | POA: Diagnosis not present

## 2019-06-15 DIAGNOSIS — J449 Chronic obstructive pulmonary disease, unspecified: Secondary | ICD-10-CM | POA: Diagnosis not present

## 2019-06-15 DIAGNOSIS — W19XXXD Unspecified fall, subsequent encounter: Secondary | ICD-10-CM | POA: Diagnosis not present

## 2019-06-15 DIAGNOSIS — F329 Major depressive disorder, single episode, unspecified: Secondary | ICD-10-CM | POA: Diagnosis not present

## 2019-06-15 DIAGNOSIS — I10 Essential (primary) hypertension: Secondary | ICD-10-CM | POA: Diagnosis not present

## 2019-06-15 DIAGNOSIS — K219 Gastro-esophageal reflux disease without esophagitis: Secondary | ICD-10-CM | POA: Diagnosis not present

## 2019-06-15 DIAGNOSIS — J302 Other seasonal allergic rhinitis: Secondary | ICD-10-CM | POA: Diagnosis not present

## 2019-06-16 ENCOUNTER — Telehealth: Payer: Self-pay | Admitting: Pulmonary Disease

## 2019-06-16 NOTE — Telephone Encounter (Signed)
I called [name] but she was not available and there was no answer. I left message for her to call back.

## 2019-06-16 NOTE — Telephone Encounter (Signed)
Forms cannot be located. I spoke with Lockie Mola again and she is re-faxing them to Dr. Alva/Cherina's attn. Will forward to [place] to look out for.

## 2019-06-16 NOTE — Telephone Encounter (Signed)
Called and spoke to [facility] with Hospice and was advised the Plan of Care form and the Supplemental Medication order form were faxed to Dr. Elsworth Soho on 8/12. Look at forms were placed on RA's desk per [place]. These are no longer on RA's desk.   Dr. Elsworth Soho do you have these forms? If so, can they be signed? Pt last seen in office in 05/2018. Thanks.

## 2019-06-16 NOTE — Telephone Encounter (Signed)
[name] returning your call Please call her at [phone]

## 2019-06-16 NOTE — Telephone Encounter (Signed)
Have signed & returned to CNA

## 2019-06-17 NOTE — Telephone Encounter (Signed)
Paperwork found, placed on RA's desk with note that TRIAGE would pick up off desk when signed. RA is back in office 06/24/19

## 2019-06-18 DIAGNOSIS — J302 Other seasonal allergic rhinitis: Secondary | ICD-10-CM | POA: Diagnosis not present

## 2019-06-18 DIAGNOSIS — J449 Chronic obstructive pulmonary disease, unspecified: Secondary | ICD-10-CM | POA: Diagnosis not present

## 2019-06-18 DIAGNOSIS — W19XXXD Unspecified fall, subsequent encounter: Secondary | ICD-10-CM | POA: Diagnosis not present

## 2019-06-18 DIAGNOSIS — F329 Major depressive disorder, single episode, unspecified: Secondary | ICD-10-CM | POA: Diagnosis not present

## 2019-06-18 DIAGNOSIS — K219 Gastro-esophageal reflux disease without esophagitis: Secondary | ICD-10-CM | POA: Diagnosis not present

## 2019-06-18 DIAGNOSIS — I10 Essential (primary) hypertension: Secondary | ICD-10-CM | POA: Diagnosis not present

## 2019-06-18 NOTE — Telephone Encounter (Signed)
Spoke to [place]. She stated that RA gave the papers to her and ask that she give them to triage. She faxed the forms back to Hospice.   Nothing further needed.

## 2019-06-18 NOTE — Telephone Encounter (Signed)
Went to look for paper in office, not on desk cannot find papers

## 2019-06-18 NOTE — Telephone Encounter (Signed)
Still waiting for signature

## 2019-06-18 NOTE — Telephone Encounter (Signed)
Signed and on my desk 

## 2019-06-19 DIAGNOSIS — J302 Other seasonal allergic rhinitis: Secondary | ICD-10-CM | POA: Diagnosis not present

## 2019-06-19 DIAGNOSIS — I10 Essential (primary) hypertension: Secondary | ICD-10-CM | POA: Diagnosis not present

## 2019-06-19 DIAGNOSIS — J449 Chronic obstructive pulmonary disease, unspecified: Secondary | ICD-10-CM | POA: Diagnosis not present

## 2019-06-19 DIAGNOSIS — F329 Major depressive disorder, single episode, unspecified: Secondary | ICD-10-CM | POA: Diagnosis not present

## 2019-06-19 DIAGNOSIS — W19XXXD Unspecified fall, subsequent encounter: Secondary | ICD-10-CM | POA: Diagnosis not present

## 2019-06-19 DIAGNOSIS — K219 Gastro-esophageal reflux disease without esophagitis: Secondary | ICD-10-CM | POA: Diagnosis not present

## 2019-06-22 DIAGNOSIS — I10 Essential (primary) hypertension: Secondary | ICD-10-CM | POA: Diagnosis not present

## 2019-06-22 DIAGNOSIS — J302 Other seasonal allergic rhinitis: Secondary | ICD-10-CM | POA: Diagnosis not present

## 2019-06-22 DIAGNOSIS — J449 Chronic obstructive pulmonary disease, unspecified: Secondary | ICD-10-CM | POA: Diagnosis not present

## 2019-06-22 DIAGNOSIS — W19XXXD Unspecified fall, subsequent encounter: Secondary | ICD-10-CM | POA: Diagnosis not present

## 2019-06-22 DIAGNOSIS — F329 Major depressive disorder, single episode, unspecified: Secondary | ICD-10-CM | POA: Diagnosis not present

## 2019-06-22 DIAGNOSIS — K219 Gastro-esophageal reflux disease without esophagitis: Secondary | ICD-10-CM | POA: Diagnosis not present

## 2019-06-23 DIAGNOSIS — W19XXXD Unspecified fall, subsequent encounter: Secondary | ICD-10-CM | POA: Diagnosis not present

## 2019-06-23 DIAGNOSIS — J449 Chronic obstructive pulmonary disease, unspecified: Secondary | ICD-10-CM | POA: Diagnosis not present

## 2019-06-23 DIAGNOSIS — K219 Gastro-esophageal reflux disease without esophagitis: Secondary | ICD-10-CM | POA: Diagnosis not present

## 2019-06-23 DIAGNOSIS — I10 Essential (primary) hypertension: Secondary | ICD-10-CM | POA: Diagnosis not present

## 2019-06-23 DIAGNOSIS — J302 Other seasonal allergic rhinitis: Secondary | ICD-10-CM | POA: Diagnosis not present

## 2019-06-23 DIAGNOSIS — F329 Major depressive disorder, single episode, unspecified: Secondary | ICD-10-CM | POA: Diagnosis not present

## 2019-06-25 DIAGNOSIS — J449 Chronic obstructive pulmonary disease, unspecified: Secondary | ICD-10-CM | POA: Diagnosis not present

## 2019-06-25 DIAGNOSIS — J302 Other seasonal allergic rhinitis: Secondary | ICD-10-CM | POA: Diagnosis not present

## 2019-06-25 DIAGNOSIS — K219 Gastro-esophageal reflux disease without esophagitis: Secondary | ICD-10-CM | POA: Diagnosis not present

## 2019-06-25 DIAGNOSIS — W19XXXD Unspecified fall, subsequent encounter: Secondary | ICD-10-CM | POA: Diagnosis not present

## 2019-06-25 DIAGNOSIS — F329 Major depressive disorder, single episode, unspecified: Secondary | ICD-10-CM | POA: Diagnosis not present

## 2019-06-25 DIAGNOSIS — I10 Essential (primary) hypertension: Secondary | ICD-10-CM | POA: Diagnosis not present

## 2019-06-26 ENCOUNTER — Telehealth: Payer: Self-pay | Admitting: Adult Health

## 2019-06-26 DIAGNOSIS — J449 Chronic obstructive pulmonary disease, unspecified: Secondary | ICD-10-CM

## 2019-06-26 MED ORDER — ARFORMOTEROL TARTRATE 15 MCG/2ML IN NEBU: 15.0000 ug | INHALATION_SOLUTION | Freq: Two times a day (BID) | RESPIRATORY_TRACT | 3 refills | Status: DC

## 2019-06-26 MED ORDER — BUDESONIDE 0.25 MG/2ML IN SUSP: 0.2500 mg | Freq: Two times a day (BID) | RESPIRATORY_TRACT | 3 refills | Status: DC

## 2019-06-26 NOTE — Telephone Encounter (Signed)
Patient spouse Eduard Clos requesting a refill on patient's Brovana and Budesonide nebs to [facility].  Refills sent. Nothing further needed; will sign off.

## 2019-06-29 ENCOUNTER — Telehealth: Payer: Self-pay | Admitting: Adult Health

## 2019-06-29 DIAGNOSIS — K219 Gastro-esophageal reflux disease without esophagitis: Secondary | ICD-10-CM | POA: Diagnosis not present

## 2019-06-29 DIAGNOSIS — J449 Chronic obstructive pulmonary disease, unspecified: Secondary | ICD-10-CM

## 2019-06-29 DIAGNOSIS — J302 Other seasonal allergic rhinitis: Secondary | ICD-10-CM | POA: Diagnosis not present

## 2019-06-29 DIAGNOSIS — W19XXXD Unspecified fall, subsequent encounter: Secondary | ICD-10-CM | POA: Diagnosis not present

## 2019-06-29 DIAGNOSIS — I10 Essential (primary) hypertension: Secondary | ICD-10-CM | POA: Diagnosis not present

## 2019-06-29 DIAGNOSIS — F329 Major depressive disorder, single episode, unspecified: Secondary | ICD-10-CM | POA: Diagnosis not present

## 2019-06-29 MED ORDER — ARFORMOTEROL TARTRATE 15 MCG/2ML IN NEBU: 15.0000 ug | INHALATION_SOLUTION | Freq: Two times a day (BID) | RESPIRATORY_TRACT | 3 refills | Status: AC

## 2019-06-29 MED ORDER — BUDESONIDE 0.25 MG/2ML IN SUSP: 0.2500 mg | Freq: Two times a day (BID) | RESPIRATORY_TRACT | 3 refills | Status: AC

## 2019-06-29 NOTE — Telephone Encounter (Signed)
Pt needs her pulmicort and brovana sent to [facility].  This has been sent as requested.  Nothing further needed at this time- will close encounter.

## 2019-06-30 DIAGNOSIS — Z9981 Dependence on supplemental oxygen: Secondary | ICD-10-CM | POA: Diagnosis not present

## 2019-06-30 DIAGNOSIS — J302 Other seasonal allergic rhinitis: Secondary | ICD-10-CM | POA: Diagnosis not present

## 2019-06-30 DIAGNOSIS — R2681 Unsteadiness on feet: Secondary | ICD-10-CM | POA: Diagnosis not present

## 2019-06-30 DIAGNOSIS — I10 Essential (primary) hypertension: Secondary | ICD-10-CM | POA: Diagnosis not present

## 2019-06-30 DIAGNOSIS — F329 Major depressive disorder, single episode, unspecified: Secondary | ICD-10-CM | POA: Diagnosis not present

## 2019-06-30 DIAGNOSIS — Z9181 History of falling: Secondary | ICD-10-CM | POA: Diagnosis not present

## 2019-06-30 DIAGNOSIS — Z7952 Long term (current) use of systemic steroids: Secondary | ICD-10-CM | POA: Diagnosis not present

## 2019-06-30 DIAGNOSIS — K219 Gastro-esophageal reflux disease without esophagitis: Secondary | ICD-10-CM | POA: Diagnosis not present

## 2019-06-30 DIAGNOSIS — J449 Chronic obstructive pulmonary disease, unspecified: Secondary | ICD-10-CM | POA: Diagnosis not present

## 2019-07-01 DIAGNOSIS — Z9981 Dependence on supplemental oxygen: Secondary | ICD-10-CM | POA: Diagnosis not present

## 2019-07-01 DIAGNOSIS — F329 Major depressive disorder, single episode, unspecified: Secondary | ICD-10-CM | POA: Diagnosis not present

## 2019-07-01 DIAGNOSIS — Z9181 History of falling: Secondary | ICD-10-CM | POA: Diagnosis not present

## 2019-07-01 DIAGNOSIS — I10 Essential (primary) hypertension: Secondary | ICD-10-CM | POA: Diagnosis not present

## 2019-07-01 DIAGNOSIS — R2681 Unsteadiness on feet: Secondary | ICD-10-CM | POA: Diagnosis not present

## 2019-07-01 DIAGNOSIS — J449 Chronic obstructive pulmonary disease, unspecified: Secondary | ICD-10-CM | POA: Diagnosis not present

## 2019-07-02 DIAGNOSIS — Z9181 History of falling: Secondary | ICD-10-CM | POA: Diagnosis not present

## 2019-07-02 DIAGNOSIS — R2681 Unsteadiness on feet: Secondary | ICD-10-CM | POA: Diagnosis not present

## 2019-07-02 DIAGNOSIS — Z9981 Dependence on supplemental oxygen: Secondary | ICD-10-CM | POA: Diagnosis not present

## 2019-07-02 DIAGNOSIS — F329 Major depressive disorder, single episode, unspecified: Secondary | ICD-10-CM | POA: Diagnosis not present

## 2019-07-02 DIAGNOSIS — I10 Essential (primary) hypertension: Secondary | ICD-10-CM | POA: Diagnosis not present

## 2019-07-02 DIAGNOSIS — J449 Chronic obstructive pulmonary disease, unspecified: Secondary | ICD-10-CM | POA: Diagnosis not present

## 2019-07-07 ENCOUNTER — Telehealth: Payer: Self-pay | Admitting: Adult Health

## 2019-07-07 MED ORDER — INFLUENZA VAC SPLIT HIGH-DOSE 0.5 ML IM SUSY: 0.5000 mL | PREFILLED_SYRINGE | Freq: Once | INTRAMUSCULAR | 0 refills | Status: AC

## 2019-07-07 NOTE — Telephone Encounter (Signed)
Signed.

## 2019-07-07 NOTE — Telephone Encounter (Signed)
Spoke with the pt's spouse, Eduard Clos and notified that rx was sent for the flu vaccine  He verbalized understanding and nothing further needed

## 2019-07-07 NOTE — Telephone Encounter (Signed)
Pt asking if rx can be sent to pharm for the high dose flu shot  She would like hospice nurse to administer this  Order pending Please advise, thanks!

## 2019-07-08 ENCOUNTER — Telehealth: Payer: Self-pay | Admitting: Adult Health

## 2019-07-08 NOTE — Telephone Encounter (Signed)
Called patient to get her nurse's number to clarify as I see the flu shot order was sent in to [facility] yesterday. Patient stated that the husband went to pick up the flu shot from the pharmacy and they will give the shot there but not dispense the shot so that husband can pick it up. The nurse with Hospice is Laurelyn Sickle at [phone], she is not in office so I did leave a message for her to call back.

## 2019-07-08 NOTE — Telephone Encounter (Signed)
Called and spoke with pt. Pt states her hospice nurse informed her she needed to have her flu shot ordered through our office in order for them to administer it. I let pt know that we could not just give out a flu shot and she would need to come into the office in order to receive it from [place]. Pt states she cannot come into the office and that she "cannot afford to not have the flu shot this year." I suggested pt and/or pt spouse speak with hospice about getting the flu shot from their facility; however, pt denied this suggested and continued to reiterate that she needed to receive it from [place]. I let her know I would send a message to my nursing supervisor regarding this matter and that we would try our best to get this taken care of. Pt expressed understanding.   Burman Nieves, please advise with your recommendations. Thank you.

## 2019-07-09 DIAGNOSIS — I10 Essential (primary) hypertension: Secondary | ICD-10-CM | POA: Diagnosis not present

## 2019-07-09 DIAGNOSIS — Z9181 History of falling: Secondary | ICD-10-CM | POA: Diagnosis not present

## 2019-07-09 DIAGNOSIS — Z9981 Dependence on supplemental oxygen: Secondary | ICD-10-CM | POA: Diagnosis not present

## 2019-07-09 DIAGNOSIS — J449 Chronic obstructive pulmonary disease, unspecified: Secondary | ICD-10-CM | POA: Diagnosis not present

## 2019-07-09 DIAGNOSIS — R2681 Unsteadiness on feet: Secondary | ICD-10-CM | POA: Diagnosis not present

## 2019-07-09 DIAGNOSIS — F329 Major depressive disorder, single episode, unspecified: Secondary | ICD-10-CM | POA: Diagnosis not present

## 2019-07-09 NOTE — Telephone Encounter (Signed)
Received call from [facility] with Hospice this morning.  Explained to her that the Rx was sent in to [facility] but that patient's husband was not able to pick it up. Jinny Blossom stated she will call and speak to [facility] and see if she can help facilitate.  She also stated if they are unable to get it there she will call us back with an alternative pharmacy that will let them pick up.  Will leave encounter open until resolved.

## 2019-07-10 NOTE — Telephone Encounter (Signed)
ATC pt's hospice nurse Laurelyn Sickle at [phone] for any updates. Line rang busy. Will leave encounter open for f/u.

## 2019-07-13 NOTE — Telephone Encounter (Signed)
ATC Megan with Hospice. The line rang busy x2. Will try back.

## 2019-07-14 NOTE — Telephone Encounter (Signed)
Called Hospice and Jinny Blossom is not in the office today but will be back tomorrow 9/16. Lmtcb for [employer].

## 2019-07-15 NOTE — Telephone Encounter (Signed)
Spoke with Megan from Hospice and she states this issue has been resolved.   Nothing further needed

## 2019-07-17 DIAGNOSIS — R2681 Unsteadiness on feet: Secondary | ICD-10-CM | POA: Diagnosis not present

## 2019-07-17 DIAGNOSIS — Z9981 Dependence on supplemental oxygen: Secondary | ICD-10-CM | POA: Diagnosis not present

## 2019-07-17 DIAGNOSIS — Z9181 History of falling: Secondary | ICD-10-CM | POA: Diagnosis not present

## 2019-07-17 DIAGNOSIS — J449 Chronic obstructive pulmonary disease, unspecified: Secondary | ICD-10-CM | POA: Diagnosis not present

## 2019-07-17 DIAGNOSIS — F329 Major depressive disorder, single episode, unspecified: Secondary | ICD-10-CM | POA: Diagnosis not present

## 2019-07-17 DIAGNOSIS — I10 Essential (primary) hypertension: Secondary | ICD-10-CM | POA: Diagnosis not present

## 2019-07-20 DIAGNOSIS — Z9181 History of falling: Secondary | ICD-10-CM | POA: Diagnosis not present

## 2019-07-20 DIAGNOSIS — F329 Major depressive disorder, single episode, unspecified: Secondary | ICD-10-CM | POA: Diagnosis not present

## 2019-07-20 DIAGNOSIS — J449 Chronic obstructive pulmonary disease, unspecified: Secondary | ICD-10-CM | POA: Diagnosis not present

## 2019-07-20 DIAGNOSIS — R2681 Unsteadiness on feet: Secondary | ICD-10-CM | POA: Diagnosis not present

## 2019-07-20 DIAGNOSIS — Z9981 Dependence on supplemental oxygen: Secondary | ICD-10-CM | POA: Diagnosis not present

## 2019-07-20 DIAGNOSIS — I10 Essential (primary) hypertension: Secondary | ICD-10-CM | POA: Diagnosis not present

## 2019-07-23 DIAGNOSIS — F329 Major depressive disorder, single episode, unspecified: Secondary | ICD-10-CM | POA: Diagnosis not present

## 2019-07-23 DIAGNOSIS — I10 Essential (primary) hypertension: Secondary | ICD-10-CM | POA: Diagnosis not present

## 2019-07-23 DIAGNOSIS — Z9981 Dependence on supplemental oxygen: Secondary | ICD-10-CM | POA: Diagnosis not present

## 2019-07-23 DIAGNOSIS — Z9181 History of falling: Secondary | ICD-10-CM | POA: Diagnosis not present

## 2019-07-23 DIAGNOSIS — R2681 Unsteadiness on feet: Secondary | ICD-10-CM | POA: Diagnosis not present

## 2019-07-23 DIAGNOSIS — J449 Chronic obstructive pulmonary disease, unspecified: Secondary | ICD-10-CM | POA: Diagnosis not present

## 2019-07-24 DIAGNOSIS — F329 Major depressive disorder, single episode, unspecified: Secondary | ICD-10-CM | POA: Diagnosis not present

## 2019-07-24 DIAGNOSIS — Z9181 History of falling: Secondary | ICD-10-CM | POA: Diagnosis not present

## 2019-07-24 DIAGNOSIS — Z9981 Dependence on supplemental oxygen: Secondary | ICD-10-CM | POA: Diagnosis not present

## 2019-07-24 DIAGNOSIS — R2681 Unsteadiness on feet: Secondary | ICD-10-CM | POA: Diagnosis not present

## 2019-07-24 DIAGNOSIS — J449 Chronic obstructive pulmonary disease, unspecified: Secondary | ICD-10-CM | POA: Diagnosis not present

## 2019-07-24 DIAGNOSIS — I10 Essential (primary) hypertension: Secondary | ICD-10-CM | POA: Diagnosis not present

## 2019-07-27 DIAGNOSIS — F329 Major depressive disorder, single episode, unspecified: Secondary | ICD-10-CM | POA: Diagnosis not present

## 2019-07-27 DIAGNOSIS — I10 Essential (primary) hypertension: Secondary | ICD-10-CM | POA: Diagnosis not present

## 2019-07-27 DIAGNOSIS — Z9981 Dependence on supplemental oxygen: Secondary | ICD-10-CM | POA: Diagnosis not present

## 2019-07-27 DIAGNOSIS — R2681 Unsteadiness on feet: Secondary | ICD-10-CM | POA: Diagnosis not present

## 2019-07-27 DIAGNOSIS — Z9181 History of falling: Secondary | ICD-10-CM | POA: Diagnosis not present

## 2019-07-27 DIAGNOSIS — J449 Chronic obstructive pulmonary disease, unspecified: Secondary | ICD-10-CM | POA: Diagnosis not present

## 2019-07-28 DIAGNOSIS — J449 Chronic obstructive pulmonary disease, unspecified: Secondary | ICD-10-CM | POA: Diagnosis not present

## 2019-07-28 DIAGNOSIS — Z9981 Dependence on supplemental oxygen: Secondary | ICD-10-CM | POA: Diagnosis not present

## 2019-07-28 DIAGNOSIS — R2681 Unsteadiness on feet: Secondary | ICD-10-CM | POA: Diagnosis not present

## 2019-07-28 DIAGNOSIS — I10 Essential (primary) hypertension: Secondary | ICD-10-CM | POA: Diagnosis not present

## 2019-07-28 DIAGNOSIS — F329 Major depressive disorder, single episode, unspecified: Secondary | ICD-10-CM | POA: Diagnosis not present

## 2019-07-28 DIAGNOSIS — Z9181 History of falling: Secondary | ICD-10-CM | POA: Diagnosis not present

## 2019-07-30 DIAGNOSIS — I10 Essential (primary) hypertension: Secondary | ICD-10-CM | POA: Diagnosis not present

## 2019-07-30 DIAGNOSIS — F329 Major depressive disorder, single episode, unspecified: Secondary | ICD-10-CM | POA: Diagnosis not present

## 2019-07-30 DIAGNOSIS — K219 Gastro-esophageal reflux disease without esophagitis: Secondary | ICD-10-CM | POA: Diagnosis not present

## 2019-07-30 DIAGNOSIS — J449 Chronic obstructive pulmonary disease, unspecified: Secondary | ICD-10-CM | POA: Diagnosis not present

## 2019-07-30 DIAGNOSIS — Z9181 History of falling: Secondary | ICD-10-CM | POA: Diagnosis not present

## 2019-07-30 DIAGNOSIS — Z7952 Long term (current) use of systemic steroids: Secondary | ICD-10-CM | POA: Diagnosis not present

## 2019-07-30 DIAGNOSIS — Z9981 Dependence on supplemental oxygen: Secondary | ICD-10-CM | POA: Diagnosis not present

## 2019-07-30 DIAGNOSIS — J302 Other seasonal allergic rhinitis: Secondary | ICD-10-CM | POA: Diagnosis not present

## 2019-07-30 DIAGNOSIS — R2681 Unsteadiness on feet: Secondary | ICD-10-CM | POA: Diagnosis not present

## 2019-08-03 DIAGNOSIS — Z9181 History of falling: Secondary | ICD-10-CM | POA: Diagnosis not present

## 2019-08-03 DIAGNOSIS — J449 Chronic obstructive pulmonary disease, unspecified: Secondary | ICD-10-CM | POA: Diagnosis not present

## 2019-08-03 DIAGNOSIS — Z9981 Dependence on supplemental oxygen: Secondary | ICD-10-CM | POA: Diagnosis not present

## 2019-08-03 DIAGNOSIS — R2681 Unsteadiness on feet: Secondary | ICD-10-CM | POA: Diagnosis not present

## 2019-08-03 DIAGNOSIS — F329 Major depressive disorder, single episode, unspecified: Secondary | ICD-10-CM | POA: Diagnosis not present

## 2019-08-03 DIAGNOSIS — I10 Essential (primary) hypertension: Secondary | ICD-10-CM | POA: Diagnosis not present

## 2019-08-06 DIAGNOSIS — R2681 Unsteadiness on feet: Secondary | ICD-10-CM | POA: Diagnosis not present

## 2019-08-06 DIAGNOSIS — J449 Chronic obstructive pulmonary disease, unspecified: Secondary | ICD-10-CM | POA: Diagnosis not present

## 2019-08-06 DIAGNOSIS — Z9181 History of falling: Secondary | ICD-10-CM | POA: Diagnosis not present

## 2019-08-06 DIAGNOSIS — F329 Major depressive disorder, single episode, unspecified: Secondary | ICD-10-CM | POA: Diagnosis not present

## 2019-08-06 DIAGNOSIS — Z9981 Dependence on supplemental oxygen: Secondary | ICD-10-CM | POA: Diagnosis not present

## 2019-08-06 DIAGNOSIS — I10 Essential (primary) hypertension: Secondary | ICD-10-CM | POA: Diagnosis not present

## 2019-08-10 DIAGNOSIS — J449 Chronic obstructive pulmonary disease, unspecified: Secondary | ICD-10-CM | POA: Diagnosis not present

## 2019-08-10 DIAGNOSIS — Z9981 Dependence on supplemental oxygen: Secondary | ICD-10-CM | POA: Diagnosis not present

## 2019-08-10 DIAGNOSIS — I10 Essential (primary) hypertension: Secondary | ICD-10-CM | POA: Diagnosis not present

## 2019-08-10 DIAGNOSIS — F329 Major depressive disorder, single episode, unspecified: Secondary | ICD-10-CM | POA: Diagnosis not present

## 2019-08-10 DIAGNOSIS — R2681 Unsteadiness on feet: Secondary | ICD-10-CM | POA: Diagnosis not present

## 2019-08-10 DIAGNOSIS — Z9181 History of falling: Secondary | ICD-10-CM | POA: Diagnosis not present

## 2019-08-12 DIAGNOSIS — F329 Major depressive disorder, single episode, unspecified: Secondary | ICD-10-CM | POA: Diagnosis not present

## 2019-08-12 DIAGNOSIS — Z9181 History of falling: Secondary | ICD-10-CM | POA: Diagnosis not present

## 2019-08-12 DIAGNOSIS — I10 Essential (primary) hypertension: Secondary | ICD-10-CM | POA: Diagnosis not present

## 2019-08-12 DIAGNOSIS — R2681 Unsteadiness on feet: Secondary | ICD-10-CM | POA: Diagnosis not present

## 2019-08-12 DIAGNOSIS — Z9981 Dependence on supplemental oxygen: Secondary | ICD-10-CM | POA: Diagnosis not present

## 2019-08-12 DIAGNOSIS — J449 Chronic obstructive pulmonary disease, unspecified: Secondary | ICD-10-CM | POA: Diagnosis not present

## 2019-08-13 DIAGNOSIS — Z9181 History of falling: Secondary | ICD-10-CM | POA: Diagnosis not present

## 2019-08-13 DIAGNOSIS — I10 Essential (primary) hypertension: Secondary | ICD-10-CM | POA: Diagnosis not present

## 2019-08-13 DIAGNOSIS — F329 Major depressive disorder, single episode, unspecified: Secondary | ICD-10-CM | POA: Diagnosis not present

## 2019-08-13 DIAGNOSIS — Z9981 Dependence on supplemental oxygen: Secondary | ICD-10-CM | POA: Diagnosis not present

## 2019-08-13 DIAGNOSIS — J449 Chronic obstructive pulmonary disease, unspecified: Secondary | ICD-10-CM | POA: Diagnosis not present

## 2019-08-13 DIAGNOSIS — R2681 Unsteadiness on feet: Secondary | ICD-10-CM | POA: Diagnosis not present

## 2019-08-17 DIAGNOSIS — R2681 Unsteadiness on feet: Secondary | ICD-10-CM | POA: Diagnosis not present

## 2019-08-17 DIAGNOSIS — Z9981 Dependence on supplemental oxygen: Secondary | ICD-10-CM | POA: Diagnosis not present

## 2019-08-17 DIAGNOSIS — Z9181 History of falling: Secondary | ICD-10-CM | POA: Diagnosis not present

## 2019-08-17 DIAGNOSIS — F329 Major depressive disorder, single episode, unspecified: Secondary | ICD-10-CM | POA: Diagnosis not present

## 2019-08-17 DIAGNOSIS — J449 Chronic obstructive pulmonary disease, unspecified: Secondary | ICD-10-CM | POA: Diagnosis not present

## 2019-08-17 DIAGNOSIS — I10 Essential (primary) hypertension: Secondary | ICD-10-CM | POA: Diagnosis not present

## 2019-08-20 DIAGNOSIS — Z9981 Dependence on supplemental oxygen: Secondary | ICD-10-CM | POA: Diagnosis not present

## 2019-08-20 DIAGNOSIS — I10 Essential (primary) hypertension: Secondary | ICD-10-CM | POA: Diagnosis not present

## 2019-08-20 DIAGNOSIS — J449 Chronic obstructive pulmonary disease, unspecified: Secondary | ICD-10-CM | POA: Diagnosis not present

## 2019-08-20 DIAGNOSIS — F329 Major depressive disorder, single episode, unspecified: Secondary | ICD-10-CM | POA: Diagnosis not present

## 2019-08-20 DIAGNOSIS — Z9181 History of falling: Secondary | ICD-10-CM | POA: Diagnosis not present

## 2019-08-20 DIAGNOSIS — R2681 Unsteadiness on feet: Secondary | ICD-10-CM | POA: Diagnosis not present

## 2019-08-21 DIAGNOSIS — Z9181 History of falling: Secondary | ICD-10-CM | POA: Diagnosis not present

## 2019-08-21 DIAGNOSIS — I10 Essential (primary) hypertension: Secondary | ICD-10-CM | POA: Diagnosis not present

## 2019-08-21 DIAGNOSIS — R2681 Unsteadiness on feet: Secondary | ICD-10-CM | POA: Diagnosis not present

## 2019-08-21 DIAGNOSIS — J449 Chronic obstructive pulmonary disease, unspecified: Secondary | ICD-10-CM | POA: Diagnosis not present

## 2019-08-21 DIAGNOSIS — F329 Major depressive disorder, single episode, unspecified: Secondary | ICD-10-CM | POA: Diagnosis not present

## 2019-08-21 DIAGNOSIS — Z9981 Dependence on supplemental oxygen: Secondary | ICD-10-CM | POA: Diagnosis not present

## 2019-08-24 DIAGNOSIS — R2681 Unsteadiness on feet: Secondary | ICD-10-CM | POA: Diagnosis not present

## 2019-08-24 DIAGNOSIS — Z9181 History of falling: Secondary | ICD-10-CM | POA: Diagnosis not present

## 2019-08-24 DIAGNOSIS — F329 Major depressive disorder, single episode, unspecified: Secondary | ICD-10-CM | POA: Diagnosis not present

## 2019-08-24 DIAGNOSIS — Z9981 Dependence on supplemental oxygen: Secondary | ICD-10-CM | POA: Diagnosis not present

## 2019-08-24 DIAGNOSIS — J449 Chronic obstructive pulmonary disease, unspecified: Secondary | ICD-10-CM | POA: Diagnosis not present

## 2019-08-24 DIAGNOSIS — I10 Essential (primary) hypertension: Secondary | ICD-10-CM | POA: Diagnosis not present

## 2019-08-26 DIAGNOSIS — Z9181 History of falling: Secondary | ICD-10-CM | POA: Diagnosis not present

## 2019-08-26 DIAGNOSIS — I10 Essential (primary) hypertension: Secondary | ICD-10-CM | POA: Diagnosis not present

## 2019-08-26 DIAGNOSIS — R2681 Unsteadiness on feet: Secondary | ICD-10-CM | POA: Diagnosis not present

## 2019-08-26 DIAGNOSIS — F329 Major depressive disorder, single episode, unspecified: Secondary | ICD-10-CM | POA: Diagnosis not present

## 2019-08-26 DIAGNOSIS — J449 Chronic obstructive pulmonary disease, unspecified: Secondary | ICD-10-CM | POA: Diagnosis not present

## 2019-08-26 DIAGNOSIS — Z9981 Dependence on supplemental oxygen: Secondary | ICD-10-CM | POA: Diagnosis not present

## 2019-08-27 DIAGNOSIS — F329 Major depressive disorder, single episode, unspecified: Secondary | ICD-10-CM | POA: Diagnosis not present

## 2019-08-27 DIAGNOSIS — Z9181 History of falling: Secondary | ICD-10-CM | POA: Diagnosis not present

## 2019-08-27 DIAGNOSIS — Z9981 Dependence on supplemental oxygen: Secondary | ICD-10-CM | POA: Diagnosis not present

## 2019-08-27 DIAGNOSIS — J449 Chronic obstructive pulmonary disease, unspecified: Secondary | ICD-10-CM | POA: Diagnosis not present

## 2019-08-27 DIAGNOSIS — R2681 Unsteadiness on feet: Secondary | ICD-10-CM | POA: Diagnosis not present

## 2019-08-27 DIAGNOSIS — I10 Essential (primary) hypertension: Secondary | ICD-10-CM | POA: Diagnosis not present

## 2019-08-30 DIAGNOSIS — Z9981 Dependence on supplemental oxygen: Secondary | ICD-10-CM | POA: Diagnosis not present

## 2019-08-30 DIAGNOSIS — Z9181 History of falling: Secondary | ICD-10-CM | POA: Diagnosis not present

## 2019-08-30 DIAGNOSIS — Z7952 Long term (current) use of systemic steroids: Secondary | ICD-10-CM | POA: Diagnosis not present

## 2019-08-30 DIAGNOSIS — Z87891 Personal history of nicotine dependence: Secondary | ICD-10-CM | POA: Diagnosis not present

## 2019-08-30 DIAGNOSIS — W5503XD Scratched by cat, subsequent encounter: Secondary | ICD-10-CM | POA: Diagnosis not present

## 2019-08-30 DIAGNOSIS — F329 Major depressive disorder, single episode, unspecified: Secondary | ICD-10-CM | POA: Diagnosis not present

## 2019-08-30 DIAGNOSIS — K219 Gastro-esophageal reflux disease without esophagitis: Secondary | ICD-10-CM | POA: Diagnosis not present

## 2019-08-30 DIAGNOSIS — I10 Essential (primary) hypertension: Secondary | ICD-10-CM | POA: Diagnosis not present

## 2019-08-30 DIAGNOSIS — S50812D Abrasion of left forearm, subsequent encounter: Secondary | ICD-10-CM | POA: Diagnosis not present

## 2019-08-30 DIAGNOSIS — J449 Chronic obstructive pulmonary disease, unspecified: Secondary | ICD-10-CM | POA: Diagnosis not present

## 2019-08-30 DIAGNOSIS — R2681 Unsteadiness on feet: Secondary | ICD-10-CM | POA: Diagnosis not present

## 2019-08-30 DIAGNOSIS — J302 Other seasonal allergic rhinitis: Secondary | ICD-10-CM | POA: Diagnosis not present

## 2019-08-31 DIAGNOSIS — F329 Major depressive disorder, single episode, unspecified: Secondary | ICD-10-CM | POA: Diagnosis not present

## 2019-08-31 DIAGNOSIS — S50812D Abrasion of left forearm, subsequent encounter: Secondary | ICD-10-CM | POA: Diagnosis not present

## 2019-08-31 DIAGNOSIS — J449 Chronic obstructive pulmonary disease, unspecified: Secondary | ICD-10-CM | POA: Diagnosis not present

## 2019-08-31 DIAGNOSIS — R2681 Unsteadiness on feet: Secondary | ICD-10-CM | POA: Diagnosis not present

## 2019-08-31 DIAGNOSIS — I10 Essential (primary) hypertension: Secondary | ICD-10-CM | POA: Diagnosis not present

## 2019-08-31 DIAGNOSIS — W5503XD Scratched by cat, subsequent encounter: Secondary | ICD-10-CM | POA: Diagnosis not present

## 2019-09-04 DIAGNOSIS — R2681 Unsteadiness on feet: Secondary | ICD-10-CM | POA: Diagnosis not present

## 2019-09-04 DIAGNOSIS — J449 Chronic obstructive pulmonary disease, unspecified: Secondary | ICD-10-CM | POA: Diagnosis not present

## 2019-09-04 DIAGNOSIS — S50812D Abrasion of left forearm, subsequent encounter: Secondary | ICD-10-CM | POA: Diagnosis not present

## 2019-09-04 DIAGNOSIS — W5503XD Scratched by cat, subsequent encounter: Secondary | ICD-10-CM | POA: Diagnosis not present

## 2019-09-04 DIAGNOSIS — I10 Essential (primary) hypertension: Secondary | ICD-10-CM | POA: Diagnosis not present

## 2019-09-04 DIAGNOSIS — F329 Major depressive disorder, single episode, unspecified: Secondary | ICD-10-CM | POA: Diagnosis not present

## 2019-09-07 ENCOUNTER — Telehealth: Payer: Self-pay | Admitting: Pulmonary Disease

## 2019-09-07 DIAGNOSIS — W5503XD Scratched by cat, subsequent encounter: Secondary | ICD-10-CM | POA: Diagnosis not present

## 2019-09-07 DIAGNOSIS — I10 Essential (primary) hypertension: Secondary | ICD-10-CM | POA: Diagnosis not present

## 2019-09-07 DIAGNOSIS — R2681 Unsteadiness on feet: Secondary | ICD-10-CM | POA: Diagnosis not present

## 2019-09-07 DIAGNOSIS — F329 Major depressive disorder, single episode, unspecified: Secondary | ICD-10-CM | POA: Diagnosis not present

## 2019-09-07 DIAGNOSIS — S50812D Abrasion of left forearm, subsequent encounter: Secondary | ICD-10-CM | POA: Diagnosis not present

## 2019-09-07 DIAGNOSIS — J449 Chronic obstructive pulmonary disease, unspecified: Secondary | ICD-10-CM | POA: Diagnosis not present

## 2019-09-07 NOTE — Telephone Encounter (Signed)
Called and spoke to [place] at [facility]. Let her know that an NP could possibly sign today but that Dr. Elsworth Soho is not here today.  She stated she will re-fax the form to be signed over and that it is okay to wait for him to sign since he will be here tomorrow.

## 2019-09-10 DIAGNOSIS — R2681 Unsteadiness on feet: Secondary | ICD-10-CM | POA: Diagnosis not present

## 2019-09-10 DIAGNOSIS — S50812D Abrasion of left forearm, subsequent encounter: Secondary | ICD-10-CM | POA: Diagnosis not present

## 2019-09-10 DIAGNOSIS — F329 Major depressive disorder, single episode, unspecified: Secondary | ICD-10-CM | POA: Diagnosis not present

## 2019-09-10 DIAGNOSIS — W5503XD Scratched by cat, subsequent encounter: Secondary | ICD-10-CM | POA: Diagnosis not present

## 2019-09-10 DIAGNOSIS — J449 Chronic obstructive pulmonary disease, unspecified: Secondary | ICD-10-CM | POA: Diagnosis not present

## 2019-09-10 DIAGNOSIS — I10 Essential (primary) hypertension: Secondary | ICD-10-CM | POA: Diagnosis not present

## 2019-09-10 NOTE — Telephone Encounter (Signed)
RA, please advise if you remember signing these forms. Thanks!

## 2019-09-10 NOTE — Telephone Encounter (Signed)
Left message for Sh[place].

## 2019-09-10 NOTE — Telephone Encounter (Signed)
I have signed several of these forms over the last few weeks including one this week. Can they please stop sending me these forms and send to her PCP instead?

## 2019-09-11 DIAGNOSIS — S50812D Abrasion of left forearm, subsequent encounter: Secondary | ICD-10-CM | POA: Diagnosis not present

## 2019-09-11 DIAGNOSIS — F329 Major depressive disorder, single episode, unspecified: Secondary | ICD-10-CM | POA: Diagnosis not present

## 2019-09-11 DIAGNOSIS — W5503XD Scratched by cat, subsequent encounter: Secondary | ICD-10-CM | POA: Diagnosis not present

## 2019-09-11 DIAGNOSIS — R2681 Unsteadiness on feet: Secondary | ICD-10-CM | POA: Diagnosis not present

## 2019-09-11 DIAGNOSIS — I10 Essential (primary) hypertension: Secondary | ICD-10-CM | POA: Diagnosis not present

## 2019-09-11 DIAGNOSIS — J449 Chronic obstructive pulmonary disease, unspecified: Secondary | ICD-10-CM | POA: Diagnosis not present

## 2019-09-11 NOTE — Telephone Encounter (Addendum)
Spoke with [place] and she states they received the paperwork and if he wanted the PCP to fill out for now on then it would have to be discussed witht he family.   I called pt to advise her but there was no answer. LM for her to call back.

## 2019-09-14 DIAGNOSIS — F329 Major depressive disorder, single episode, unspecified: Secondary | ICD-10-CM | POA: Diagnosis not present

## 2019-09-14 DIAGNOSIS — R2681 Unsteadiness on feet: Secondary | ICD-10-CM | POA: Diagnosis not present

## 2019-09-14 DIAGNOSIS — J449 Chronic obstructive pulmonary disease, unspecified: Secondary | ICD-10-CM | POA: Diagnosis not present

## 2019-09-14 DIAGNOSIS — I10 Essential (primary) hypertension: Secondary | ICD-10-CM | POA: Diagnosis not present

## 2019-09-14 DIAGNOSIS — W5503XD Scratched by cat, subsequent encounter: Secondary | ICD-10-CM | POA: Diagnosis not present

## 2019-09-14 DIAGNOSIS — S50812D Abrasion of left forearm, subsequent encounter: Secondary | ICD-10-CM | POA: Diagnosis not present

## 2019-09-14 NOTE — Telephone Encounter (Signed)
LMTCB

## 2019-09-15 NOTE — Telephone Encounter (Signed)
LMTCB x3 for pt. We have attempted to contact pt several times with no success or call back from pt. Per triage protocol, message will be closed.   

## 2019-09-17 DIAGNOSIS — I10 Essential (primary) hypertension: Secondary | ICD-10-CM | POA: Diagnosis not present

## 2019-09-17 DIAGNOSIS — F329 Major depressive disorder, single episode, unspecified: Secondary | ICD-10-CM | POA: Diagnosis not present

## 2019-09-17 DIAGNOSIS — J449 Chronic obstructive pulmonary disease, unspecified: Secondary | ICD-10-CM | POA: Diagnosis not present

## 2019-09-17 DIAGNOSIS — S50812D Abrasion of left forearm, subsequent encounter: Secondary | ICD-10-CM | POA: Diagnosis not present

## 2019-09-17 DIAGNOSIS — R2681 Unsteadiness on feet: Secondary | ICD-10-CM | POA: Diagnosis not present

## 2019-09-17 DIAGNOSIS — W5503XD Scratched by cat, subsequent encounter: Secondary | ICD-10-CM | POA: Diagnosis not present

## 2019-09-22 DIAGNOSIS — W5503XD Scratched by cat, subsequent encounter: Secondary | ICD-10-CM | POA: Diagnosis not present

## 2019-09-22 DIAGNOSIS — S50812D Abrasion of left forearm, subsequent encounter: Secondary | ICD-10-CM | POA: Diagnosis not present

## 2019-09-22 DIAGNOSIS — R2681 Unsteadiness on feet: Secondary | ICD-10-CM | POA: Diagnosis not present

## 2019-09-22 DIAGNOSIS — I10 Essential (primary) hypertension: Secondary | ICD-10-CM | POA: Diagnosis not present

## 2019-09-22 DIAGNOSIS — F329 Major depressive disorder, single episode, unspecified: Secondary | ICD-10-CM | POA: Diagnosis not present

## 2019-09-22 DIAGNOSIS — J449 Chronic obstructive pulmonary disease, unspecified: Secondary | ICD-10-CM | POA: Diagnosis not present

## 2019-09-28 DIAGNOSIS — R2681 Unsteadiness on feet: Secondary | ICD-10-CM | POA: Diagnosis not present

## 2019-09-28 DIAGNOSIS — S50812D Abrasion of left forearm, subsequent encounter: Secondary | ICD-10-CM | POA: Diagnosis not present

## 2019-09-28 DIAGNOSIS — J449 Chronic obstructive pulmonary disease, unspecified: Secondary | ICD-10-CM | POA: Diagnosis not present

## 2019-09-28 DIAGNOSIS — F329 Major depressive disorder, single episode, unspecified: Secondary | ICD-10-CM | POA: Diagnosis not present

## 2019-09-28 DIAGNOSIS — I10 Essential (primary) hypertension: Secondary | ICD-10-CM | POA: Diagnosis not present

## 2019-09-28 DIAGNOSIS — W5503XD Scratched by cat, subsequent encounter: Secondary | ICD-10-CM | POA: Diagnosis not present

## 2019-10-26 ENCOUNTER — Telehealth: Payer: Self-pay | Admitting: Adult Health

## 2019-10-26 NOTE — Telephone Encounter (Signed)
Pt wants RA's recommendations on if she can stop taking her Pulmicort and Brovana.  Pt states that she's been trying to take just one of her meds daily, alternating between Pulmicort or Brovana QD.  Pt also notes that she cannot use inhalers but has Duoneb prn.  Pt states she takes this once every 1-2 weeks.  RA please advise on your recs.  Thanks!

## 2019-11-02 NOTE — Telephone Encounter (Signed)
Called spoke with patient and discussed TP's recommendations.  Patient unable to have visit today, but scheduled MyChart video visit for tomorrow 1.5.2021 @ 1330.  Nothing further needed; will sign off.

## 2019-11-02 NOTE — Telephone Encounter (Signed)
Due to Dr. Elsworth Soho being out of the office, routing to TP for her to review.  Tammy, please advise on this for pt.

## 2019-11-02 NOTE — Telephone Encounter (Signed)
Needs a visit , has not been seen in >1 year .  Can make televisit/videovisit to discuss issues

## 2019-11-03 ENCOUNTER — Encounter: Payer: Self-pay | Admitting: Adult Health

## 2019-11-03 ENCOUNTER — Other Ambulatory Visit: Payer: Self-pay

## 2019-11-03 ENCOUNTER — Telehealth (INDEPENDENT_AMBULATORY_CARE_PROVIDER_SITE_OTHER): Admitting: Adult Health

## 2019-11-03 DIAGNOSIS — Z9981 Dependence on supplemental oxygen: Secondary | ICD-10-CM | POA: Diagnosis not present

## 2019-11-03 DIAGNOSIS — J449 Chronic obstructive pulmonary disease, unspecified: Secondary | ICD-10-CM | POA: Diagnosis not present

## 2019-11-03 DIAGNOSIS — J9610 Chronic respiratory failure, unspecified whether with hypoxia or hypercapnia: Secondary | ICD-10-CM | POA: Diagnosis not present

## 2019-11-03 DIAGNOSIS — J9611 Chronic respiratory failure with hypoxia: Secondary | ICD-10-CM

## 2019-11-03 DIAGNOSIS — Z7951 Long term (current) use of inhaled steroids: Secondary | ICD-10-CM

## 2019-11-03 DIAGNOSIS — E43 Unspecified severe protein-calorie malnutrition: Secondary | ICD-10-CM

## 2019-11-03 DIAGNOSIS — Z87891 Personal history of nicotine dependence: Secondary | ICD-10-CM

## 2019-11-03 NOTE — Patient Instructions (Addendum)
Continue on Pulmicort and Brovana twice daily Continue on Atrovent nebulizer 4 times daily Continue Prednisone 20mg  . daily  Weigh twice weekly and keep weight log Increase boost plus to twice daily between meals High-protein diet Activity as tolerated Continue on oxygen 3 L.   Follow up in 4 weeks with video visit with Dr. Elsworth Soho or Kalecia Hartney Np  and As needed   Please contact office for sooner follow up if symptoms do not improve or worsen or seek emergency care

## 2019-11-03 NOTE — Progress Notes (Signed)
Virtual Visit via Telephone Note  I connected with Betha Loa on 11/03/19 at  1:30 PM EST by telephone and verified that I am speaking with the correct person using two identifiers.  Location: Patient: Home Provider: Office   I discussed the limitations, risks, security and privacy concerns of performing an evaluation and management service by telephone and the availability of in person appointments. I also discussed with the patient that there may be a patient responsible charge related to this service. The patient expressed understanding and agreed to proceed.   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  On home hospice  Today's video visit is for a follow-up visit for COPD and oxygen dependent respiratory failure.  Last seen office visit January 2019.  She is followed at home through hospice care. Says she is doing okay with breathing . Has not had a flare of her COPD in long time. Gets short of breath with minimum activity.  Is sedentary.  But overall feels her breathing is stable.  She remains on Pulmicort and Brovana nebulizer twice daily.  Uses Atrovent nebulizer 4 times daily.  She is on chronic steroids with prednisone 20 mg daily.  Have not been able to go below 20 mg in a long time.  She remains on oxygen at 3 L.  Has had no increased oxygen demands. Says her biggest issue has been balance issues.  Says that she hurt her shoulders several months ago.  Was having a lot of trouble with this but seems to be getting better.  During that timeframe weight went down.  She says 2 weeks ago she weighed 58 pounds.  She denies any bloody stools chest pain orthopnea PND or increased leg swelling.  She has had no recent lab work. She says she does use boost most days.  She says her appetite is improving.  She eats 2 good meals a day.     Observations/Objective: Completed pulmonary  rehab 2011  Admitted 12/2012 for COPD exacerbation .ABG 7.39/55/75 on 3L [place]  She required low dose prednisone since her admit in 02/2013   Had colonoscopy with polyp removal 06/2013  08/2013 BL pneumonia  03/2014 ONO on 2L >>no sig desatn  CT chest 10/2014 neg PE  12/06/2014 >>2.5 L at rest , 3L on exertion - ONO ok in the past St[facility] 12/2015  Hospice referral 07/2017  Assessment and Plan: Gold D/very severe COPD currently stable on hospice care patient has remarkably done well over the last 2 years and appears to be stable with her underlying COPD.  She has had no flareups.  Have not had to increase her oxygen. For now we will continue on her current regimens. Am concerned with her weight loss.  For now we will increase her boost intake.  Weigh twice a week and keep weight log.  We will have her have another video visit in 1 month.  If weight is not improving will need to have lab work and chest x-ray.  Protein malnutrition-patient has very severe COPD in the end stages. Suspect weight loss is multifactorial however she has not had any labs or chest x-ray recently.  We will advise her to weigh twice weekly keep a log.  Increase boost usage to twice daily.  Follow-up in 1 month.  If weight is not improving will need office visit with chest x-ray and labs.  Chronic respiratory failure.  Oxygen is stable on current settings.  No increased oxygen demands Plan  Patient Instructions  Continue on Pulmicort and Brovana twice daily Continue on Atrovent nebulizer 4 times daily Continue Prednisone 20mg  . daily  Weigh twice weekly and keep weight log Increase boost plus to twice daily between meals High-protein diet Activity as tolerated Continue on oxygen 3 L.   Follow up in 4 weeks with video visit with Dr. Elsworth Soho or Mardell Suttles Np  and As needed   Please contact office for sooner follow up if symptoms do not improve or worsen or seek emergency care         Follow Up Instructions: Follow-up in 4 weeks and as needed Please contact office for sooner follow up if symptoms do not improve or worsen or seek emergency care    I discussed the assessment and treatment plan with the patient. The patient was provided an opportunity to ask questions and all were answered. The patient agreed with the plan and demonstrated an understanding of the instructions.   The patient was advised to call back or seek an in-person evaluation if the symptoms worsen or if the condition fails to improve as anticipated.  I provided 32 minutes of non-face-to-face time during this encounter.   Rexene Edison, NP

## 2019-12-28 DEATH — deceased

## 2020-02-27 DEATH — deceased

## 2021-01-09 ENCOUNTER — Ambulatory Visit: Payer: Medicare PPO | Admitting: Adult Health
